# Patient Record
Sex: Male | Born: 1955 | Race: White | Hispanic: No | Marital: Married | Smoking: Former smoker
Health system: Southern US, Community
[De-identification: ages and names within clinical notes are randomized; demographics above are authoritative.]

## PROBLEM LIST (undated history)

## (undated) ENCOUNTER — Ambulatory Visit: Payer: Medicare Other | Source: Home / Self Care

## (undated) ENCOUNTER — Ambulatory Visit: Source: Home / Self Care

## (undated) DIAGNOSIS — G4700 Insomnia, unspecified: Secondary | ICD-10-CM

## (undated) DIAGNOSIS — F32A Depression, unspecified: Secondary | ICD-10-CM

## (undated) DIAGNOSIS — R0600 Dyspnea, unspecified: Secondary | ICD-10-CM

## (undated) DIAGNOSIS — G8929 Other chronic pain: Secondary | ICD-10-CM

## (undated) DIAGNOSIS — I10 Essential (primary) hypertension: Secondary | ICD-10-CM

## (undated) DIAGNOSIS — F329 Major depressive disorder, single episode, unspecified: Secondary | ICD-10-CM

## (undated) DIAGNOSIS — R7309 Other abnormal glucose: Secondary | ICD-10-CM

## (undated) DIAGNOSIS — E785 Hyperlipidemia, unspecified: Secondary | ICD-10-CM

## (undated) DIAGNOSIS — IMO0001 Reserved for inherently not codable concepts without codable children: Secondary | ICD-10-CM

## (undated) DIAGNOSIS — M25511 Pain in right shoulder: Secondary | ICD-10-CM

## (undated) DIAGNOSIS — E669 Obesity, unspecified: Secondary | ICD-10-CM

## (undated) DIAGNOSIS — M549 Dorsalgia, unspecified: Secondary | ICD-10-CM

## (undated) DIAGNOSIS — G4733 Obstructive sleep apnea (adult) (pediatric): Secondary | ICD-10-CM

## (undated) DIAGNOSIS — M1990 Unspecified osteoarthritis, unspecified site: Secondary | ICD-10-CM

## (undated) DIAGNOSIS — J45909 Unspecified asthma, uncomplicated: Secondary | ICD-10-CM

## (undated) DIAGNOSIS — M6530 Trigger finger, unspecified finger: Secondary | ICD-10-CM

## (undated) DIAGNOSIS — Z9109 Other allergy status, other than to drugs and biological substances: Secondary | ICD-10-CM

## (undated) DIAGNOSIS — K219 Gastro-esophageal reflux disease without esophagitis: Secondary | ICD-10-CM

## (undated) DIAGNOSIS — M25512 Pain in left shoulder: Secondary | ICD-10-CM

## (undated) DIAGNOSIS — Z9989 Dependence on other enabling machines and devices: Principal | ICD-10-CM

## (undated) HISTORY — DX: Essential (primary) hypertension: I10

## (undated) HISTORY — DX: Trigger finger, unspecified finger: M65.30

## (undated) HISTORY — DX: Gastro-esophageal reflux disease without esophagitis: K21.9

## (undated) HISTORY — DX: Dorsalgia, unspecified: M54.9

## (undated) HISTORY — DX: Pain in left shoulder: M25.512

## (undated) HISTORY — DX: Unspecified osteoarthritis, unspecified site: M19.90

## (undated) HISTORY — DX: Obstructive sleep apnea (adult) (pediatric): G47.33

## (undated) HISTORY — DX: Depression, unspecified: F32.A

## (undated) HISTORY — PX: OTHER SURGICAL HISTORY: SHX169

## (undated) HISTORY — DX: Reserved for inherently not codable concepts without codable children: IMO0001

## (undated) HISTORY — PX: HERNIA REPAIR: SHX51

## (undated) HISTORY — DX: Obesity, unspecified: E66.9

## (undated) HISTORY — DX: Hyperlipidemia, unspecified: E78.5

## (undated) HISTORY — DX: Other allergy status, other than to drugs and biological substances: Z91.09

## (undated) HISTORY — DX: Other abnormal glucose: R73.09

## (undated) HISTORY — DX: Major depressive disorder, single episode, unspecified: F32.9

## (undated) HISTORY — DX: Unspecified asthma, uncomplicated: J45.909

## (undated) HISTORY — DX: Other chronic pain: G89.29

## (undated) HISTORY — DX: Dyspnea, unspecified: R06.00

## (undated) HISTORY — DX: Insomnia, unspecified: G47.00

## (undated) HISTORY — DX: Dependence on other enabling machines and devices: Z99.89

## (undated) HISTORY — DX: Pain in right shoulder: M25.511

---

## 2002-08-09 ENCOUNTER — Encounter: Payer: Self-pay | Admitting: Neurology

## 2007-05-12 ENCOUNTER — Emergency Department (HOSPITAL_COMMUNITY): Admission: EM | Admit: 2007-05-12 | Discharge: 2007-05-12 | Payer: Self-pay | Admitting: Emergency Medicine

## 2008-05-16 ENCOUNTER — Encounter: Payer: Self-pay | Admitting: Neurology

## 2012-07-19 HISTORY — PX: APPENDECTOMY: SHX54

## 2012-09-18 HISTORY — PX: HEMORRHOID SURGERY: SHX153

## 2014-07-25 ENCOUNTER — Encounter: Payer: Self-pay | Admitting: Neurology

## 2014-08-01 ENCOUNTER — Ambulatory Visit (INDEPENDENT_AMBULATORY_CARE_PROVIDER_SITE_OTHER): Payer: BC Managed Care – PPO | Admitting: Neurology

## 2014-08-01 ENCOUNTER — Encounter: Payer: Self-pay | Admitting: Neurology

## 2014-08-01 ENCOUNTER — Encounter (INDEPENDENT_AMBULATORY_CARE_PROVIDER_SITE_OTHER): Payer: Self-pay

## 2014-08-01 VITALS — BP 135/78 | HR 59 | Temp 98.2°F | Resp 18 | Ht 69.5 in | Wt 242.0 lb

## 2014-08-01 DIAGNOSIS — E669 Obesity, unspecified: Secondary | ICD-10-CM

## 2014-08-01 DIAGNOSIS — Z9989 Dependence on other enabling machines and devices: Principal | ICD-10-CM

## 2014-08-01 DIAGNOSIS — G4733 Obstructive sleep apnea (adult) (pediatric): Secondary | ICD-10-CM

## 2014-08-01 HISTORY — DX: Obstructive sleep apnea (adult) (pediatric): G47.33

## 2014-08-01 NOTE — Patient Instructions (Signed)
Please continue using your biPAP regularly. While your insurance requires that you use PAP at least 4 hours each night on 70% of the nights, I recommend, that you not skip any nights and use it throughout the night if you can. Getting used to BiPAP and staying with the treatment long term does take time and patience and discipline. Untreated obstructive sleep apnea when it is moderate to severe can have an adverse impact on cardiovascular health and raise her risk for heart disease, arrhythmias, hypertension, congestive heart failure, stroke and diabetes. Untreated obstructive sleep apnea causes sleep disruption, nonrestorative sleep, and sleep deprivation. This can have an impact on your day to day functioning and cause daytime sleepiness and impairment of cognitive function, memory loss, mood disturbance, and problems focussing. Using PAP regularly can improve these symptoms.  Since you are doing well currently, I can see you back in a year.

## 2014-08-01 NOTE — Progress Notes (Signed)
Subjective:    Patient ID: George Montgomery is a 58 y.o. male.  HPI    Star Age, MD, PhD [facility] [address] P.O. [place],  [address]  Dear Dr. Bebe Shaggy,   I saw your patient, George Montgomery, upon your kind request in my neurologic clinic today for initial consultation of his obstructive sleep apnea, for re-evaluation. The patient is unaccompanied today. As you know, George Montgomery is a 58 year old right-handed gentleman with an underlying medical  history of cervical and lumbar degenerative spine disease, anxiety, depression (not currently on medication d/t side), asthma, hypertension, obesity, who was diagnosed with obstructive sleep apnea a few years ago. He has not had a recheck of his CPAP in over 3 years. Unfortunately, I do not have any prior sleep study results available for review. The patient recalls being diagnosed with OSA some 12 years ago, severe, per patient. Over the years, he has had about 3 sleep studies, last some 3 years ago. He was originally placed on CPAP and most recently at the last sleep study he remembers that he was switched to BiPAP. He brought his BiPAP machine in for review and we were able to get a download: This was from 05/01/2014 through 07/31/2014 during which time he used his BiPAP machine every night without any skipped days and his percent used days greater than 4 hours was 100% indicating superb compliance, average usage was 8 hours and 7 minutes approximately. Residual AHI was low at 0.7 per hour and leak was very low at 1 minute and 58 seconds as far as large leak per day dose. His pressure is 17/13 cm.   His typical bedtime is between 10:30 and 11 PM and usually falls asleep quickly. Weight time is between 6 and 6:30 AM. He estimates that he gets about 7 hours of sleep. He denies any morning headaches or severe daytime somnolence. He does have an ESS of 11/24 today. He denies significant restless leg  symptoms. His first sleep study may have been about 12 years ago. He gained quite a bit of weight after he quit smoking about 2 years ago. He gets his DME care through [facility] [employer].   His Past Medical History Is Significant For: Past Medical History  Diagnosis Date  . Reflux   . Environmental allergies     His Past Surgical History Is Significant For: Past Surgical History  Procedure Laterality Date  . Hemorrhoid surgery  09/2012  . Appendectomy  07/2012    His Family History Is Significant For: Family History  Problem Relation Age of Onset  . Heart disease Mother   . Cancer - Colon Other   . Cancer - Prostate Other     His Social History Is Significant For: History   Social History  . Marital Status: Married    Spouse Name: N/A    Number of Children: N/A  . Years of Education: N/A   Social History Main Topics  . Smoking status: Former Smoker    Quit date: 12/03/2011  . Smokeless tobacco: Not on file  . Alcohol Use: 1.0 oz/week    2 drink(s) per week     Comment: occ  . Drug Use: No  . Sexual Activity: Not on file   Other Topics Concern  . Not on file   Social History Narrative   Right handed, caffeine 3-4 cups daily, Married, 4 kids, 9 g kids., FT insurance agent (self employed). 4 yrs college  His Allergies Are:  Allergies not on file:   His Current Medications Are:  Outpatient Encounter Prescriptions as of 08/01/2014  Medication Sig  . clotrimazole-betamethasone (LOTRISONE) cream   . fluticasone (FLONASE) 50 MCG/ACT nasal spray   . HYDROcodone-acetaminophen (NORCO/VICODIN) 5-325 MG per tablet Take 1 tablet by mouth every 6 (six) hours as needed for moderate pain.  . montelukast (SINGULAIR) 10 MG tablet   . pantoprazole (PROTONIX) 40 MG tablet   . zolpidem (AMBIEN) 10 MG tablet Take 10 mg by mouth at bedtime as needed for sleep (takes 1/2 tablet).  : Review of Systems:  Out of a complete 14 point review of systems, all are reviewed and  negative with the exception of these symptoms as listed below:   Review of Systems  Constitutional: Positive for fatigue and unexpected weight change.  HENT:       Trouble swallowing  Respiratory: Positive for cough.   Endocrine: Positive for heat intolerance.  Genitourinary:       Impotence  Skin:       itiching , moles  Neurological:       Memory loss, insomnia, RL    Objective:  Neurologic Exam  Physical Exam Physical Examination:   Filed Vitals:   08/01/14 1003  BP: 135/78  Pulse: 59  Temp: 98.2 F (36.8 C)  Resp: 18   General Examination: The patient is a very pleasant 58 y.o. male in no acute distress. He appears well-developed and well-nourished and well groomed. He is obese.   HEENT: Normocephalic, atraumatic, pupils are equal, round and reactive to light and accommodation. Funduscopic exam is normal with sharp disc margins noted. Extraocular tracking is good without limitation to gaze excursion or nystagmus noted. Normal smooth pursuit is noted. Hearing is grossly intact. Tympanic membranes are clear bilaterally. Face is symmetric with normal facial animation and normal facial sensation. Speech is clear with no dysarthria noted. There is no hypophonia. There is no lip, neck/head, jaw or voice tremor. Neck is supple with full range of passive and active motion. There are no carotid bruits on auscultation. Oropharynx exam reveals: moderate mouth dryness, adequate dental hygiene and moderate airway crowding, due to thicker soft palate, and larger tongue and redundant soft palate. Mallampati is class II. Tongue protrudes centrally and palate elevates symmetrically. Tonsils are absent. Neck size is 17.25 inches. He has a Absent overbite. Nasal inspection reveals no significant nasal mucosal bogginess or redness and no septal deviation.   Chest: Clear to auscultation without wheezing, rhonchi or crackles noted.  Heart: S1+S2+0, regular and normal without murmurs, rubs or  gallops noted.   Abdomen: Soft, non-tender and non-distended with normal bowel sounds appreciated on auscultation.  Extremities: There is no pitting edema in the distal lower extremities bilaterally. Pedal pulses are intact.  Skin: Warm and dry without trophic changes noted. There are no varicose veins.  Musculoskeletal: exam reveals no obvious joint deformities, tenderness or joint swelling or erythema.   Neurologically:  Mental status: The patient is awake, alert and oriented in all 4 spheres. His immediate and remote memory, attention, language skills and fund of knowledge are appropriate. There is no evidence of aphasia, agnosia, apraxia or anomia. Speech is clear with normal prosody and enunciation. Thought process is linear. Mood is normal and affect is normal.  Cranial nerves II - XII are as described above under HEENT exam. In addition: shoulder shrug is normal with equal shoulder height noted. Motor exam: Normal bulk, strength and tone is noted. There is  no drift, tremor or rebound. Romberg is negative. Reflexes are 2+ throughout. Babinski: Toes are flexor bilaterally. Fine motor skills and coordination: intact with normal finger taps, normal hand movements, normal rapid alternating patting, normal foot taps and normal foot agility.  Cerebellar testing: No dysmetria or intention tremor on finger to nose testing. Heel to shin is unremarkable bilaterally. There is no truncal or gait ataxia.  Sensory exam: intact to light touch, pinprick, vibration, temperature sense in the upper and lower extremities.  Gait, station and balance: He stands easily. No veering to one side is noted. No leaning to one side is noted. Posture is age-appropriate and stance is narrow based. Gait shows normal stride length and normal pace. No problems turning are noted. He turns en bloc. Tandem walk is unremarkable. Intact toe and heel stance is noted.               Assessment and Plan:   In summary, KIMBERLY COYE  is a very pleasant 59 y.o.-year old male with an underlying medical  history of cervical and lumbar degenerative spine disease, anxiety, depression, asthma, hypertension, obesity, who was diagnosed with obstructive sleep apnea several years ago, about 12 years ago per patient and he reports that he was diagnosed with severe sleep apnea at the time. While he does report interim weight gain and having had at least one or maybe 2 more sleep study since then he appears to be well treated with BiPAP therapy at a pressure of 17/13 cm with excellent compliance noted. I congratulated him regarding his superb compliance and asked him to continue therapy at this time. Leak was also not very high and at this juncture I believe he is adequately treated with no significant residual sleep related complaints per se and no significant sleep disordered breathing with a residual AHI less than 1 per hour. He is advised regarding a healthy lifestyle in general. I do believe he would benefit from losing weight and he is trying. He has enrolled with [employer] he tells me. Furthermore, he would like to meet with a nutritionist. He is encouraged to discuss this with you at your next visit. I had a long chat with the patient about my findings and the diagnosis of OSA, its prognosis and treatment options. We talked about medical treatments, surgical interventions and non-pharmacological approaches. I explained in particular the risks and ramifications of untreated moderate to severe OSA, especially with respect to developing cardiovascular disease down the Road, including congestive heart failure, difficult to treat hypertension, cardiac arrhythmias, or stroke. Even type 2 diabetes has, in part, been linked to untreated OSA. Symptoms of untreated OSA include daytime sleepiness, memory problems, mood irritability and mood disorder such as depression and anxiety, lack of energy, as well as recurrent headaches, especially morning  headaches. We talked about trying to maintain a healthy lifestyle in general, as well as the importance of weight control. I encouraged the patient to eat healthy, exercise daily and keep well hydrated, to keep a scheduled bedtime and wake time routine, to not skip any meals and eat healthy snacks in between meals. I advised the patient not to drive when feeling sleepy. I recommended the following at this time: Continue BiPAP therapy with the current settings. He is doing well and I would like for him to followup in a years time sooner if we need to. I answered all his questions today and the patient was in agreement. Thank you very much for allowing me to participate  in the care of this nice patient. If I can be of any further assistance to you please do not hesitate to call me at [phone].  Sincerely,   Star Age, MD, PhD

## 2014-08-27 ENCOUNTER — Encounter: Payer: Self-pay | Admitting: Neurology

## 2014-08-27 ENCOUNTER — Ambulatory Visit (INDEPENDENT_AMBULATORY_CARE_PROVIDER_SITE_OTHER): Payer: BC Managed Care – PPO | Admitting: Neurology

## 2014-08-27 VITALS — BP 146/75 | HR 70 | Temp 98.1°F | Ht 70.0 in | Wt 242.0 lb

## 2014-08-27 DIAGNOSIS — Z9989 Dependence on other enabling machines and devices: Secondary | ICD-10-CM

## 2014-08-27 DIAGNOSIS — G44009 Cluster headache syndrome, unspecified, not intractable: Secondary | ICD-10-CM

## 2014-08-27 DIAGNOSIS — R519 Headache, unspecified: Secondary | ICD-10-CM

## 2014-08-27 DIAGNOSIS — R51 Headache: Secondary | ICD-10-CM

## 2014-08-27 DIAGNOSIS — E669 Obesity, unspecified: Secondary | ICD-10-CM

## 2014-08-27 DIAGNOSIS — G4733 Obstructive sleep apnea (adult) (pediatric): Secondary | ICD-10-CM

## 2014-08-27 MED ORDER — VERAPAMIL HCL 40 MG PO TABS: 40.0000 mg | ORAL_TABLET | Freq: Three times a day (TID) | ORAL | Status: DC

## 2014-08-27 MED ORDER — SUMATRIPTAN SUCCINATE 100 MG PO TABS: 100.0000 mg | ORAL_TABLET | Freq: Once | ORAL | Status: DC

## 2014-08-27 NOTE — Progress Notes (Signed)
Subjective:    Patient ID: George Montgomery is a 58 y.o. male.  HPI     Interim history:   George Montgomery is a 58 year old right-handed gentleman with an underlying medical  history of cervical and lumbar degenerative spine disease, anxiety, depression (not currently on medication d/t side), asthma, hypertension, obesity, and OSA, who presents for sooner than scheduled appointment for a new problem and a new referral by Dr. Bebe Shaggy for evaluation of cluster headaches. The patient is unaccompanied today. I first met him on 08/01/2014 at the request of Dr. Bebe Shaggy for reevaluation of his obstructive sleep apnea, treated with BiPAP. He was doing well at the time and compliant with BiPAP therapy which was effective.   The patient reports new onset HA since 08/02/14, started abruptly. No triggers noted. He recently had the flu shot. He also stopped his testosterone replacement about a month ago. He does endorse more stress, work related. He has also recently increased his caffeine intake. He has had a daily headache which lasts up to 2 hours. It typically starts in the evening. It is right-sided behind his eye and retro-orbital also in the temporal area and radiates to the back. It is severe. He does get puffiness around the right eye and occasional runny nose on the right side. He does not have any double vision or blurry vision. Glasses have not been checked in over 18 months. He does not have nausea but does have to lie down. Hydrocodone has not helped it only 1 pill and he has to take 2. He has also taken high-dose Motrin, 600 mg, and has tried a muscle relaxant, he has to lie down. He has never had anything like this before. He does not have a history of migraines before. He does not note any neurological symptoms and in between he feels at baseline. He has been reluctant to do anything besides the minimal for fear of exacerbating these headaches. They have been daily since 08/02/2014. He was prescribed  Frovatriptan on 08/21/14 by Dr. Bebe Shaggy, but his insurance did not cover that. He has tried sumatriptan and 50 mg which he felt has helped after about 15-20 minutes.he has found relief with taking a warm bath. He has also tried heat pad on his head, which provided some relief.   The patient recalls being diagnosed with OSA some 12 years ago, severe, per patient. Over the years, he has had about 3 sleep studies, last some 3 years ago. He was originally placed on CPAP and most recently at the last sleep study he remembers that he was switched to BiPAP. He brought his BiPAP machine in for review and we were able to get a download: This was from 05/01/2014 through 07/31/2014 during which time he used his BiPAP machine every night without any skipped days and his percent used days greater than 4 hours was 100% indicating superb compliance, average usage was 8 hours and 7 minutes approximately. Residual AHI was low at 0.7 per hour and leak was very low at 1 minute and 58 seconds as far as large leak per day dose. His pressure is 17/13 cm.    His typical bedtime is between 10:30 and 11 PM and usually falls asleep quickly. Weight time is between 6 and 6:30 AM. He estimates that he gets about 7 hours of sleep. He denies any morning headaches or severe daytime somnolence. He does have an ESS of 11/24 today. He denies significant restless leg symptoms. His first sleep study may  have been about 12 years ago. He gained quite a bit of weight after he quit smoking about 2 years ago. He gets his DME care through [facility] [employer].     His Past Medical History Is Significant For: Past Medical History  Diagnosis Date  . Reflux   . Environmental allergies   . OSA on CPAP 08/01/2014    His Past Surgical History Is Significant For: Past Surgical History  Procedure Laterality Date  . Hemorrhoid surgery  09/2012  . Appendectomy  07/2012    Her Family History Is Significant For: Family History  Problem  Relation Age of Onset  . Heart disease Mother   . Cancer - Colon Other   . Cancer - Prostate Other   . Stroke Maternal Aunt   . Cancer - Colon Paternal Uncle   . Cancer - Prostate Paternal Uncle   . Macular degeneration Mother     His Social History Is Significant For: History   Social History  . Marital Status: Married    Spouse Name: [name]    Number of Children: 4  . Years of Education: college   Occupational History  .      [employer]   Social History Main Topics  . Smoking status: Former Smoker    Quit date: 12/03/2011  . Smokeless tobacco: Never Used  . Alcohol Use: 1.2 oz/week    2 Not specified per week     Comment: occ  . Drug Use: No  . Sexual Activity: None   Other Topics Concern  . 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 azithromycin (ZITHROMAX) 500 MG tablet 500 mg daily.  . clotrimazole-betamethasone (LOTRISONE) cream   . fluticasone (FLONASE) 50 MCG/ACT nasal spray   . HYDROcodone-acetaminophen (NORCO/VICODIN) 5-325 MG per tablet Take 1 tablet by mouth every 6 (six) hours as needed for moderate pain.  Marland Kitchen ibuprofen (ADVIL,MOTRIN) 600 MG tablet Take 600 mg by mouth every 6 (six) hours as needed. 2-3  . ketorolac (TORADOL) 10 MG tablet   . montelukast (SINGULAIR) 10 MG tablet   . orphenadrine (NORFLEX) 100 MG tablet 100 mg 2 (two) times daily.  . SUMAtriptan (IMITREX) 50 MG tablet   . zolpidem (AMBIEN) 10 MG tablet Take 10 mg by mouth at bedtime as needed for sleep (takes 1/2 tablet).  . pantoprazole (PROTONIX) 40 MG tablet   :  Review of Systems:  Out of a complete 14 point review of systems, all are reviewed and negative with the exception of these symptoms as listed below:    Review of Systems  Constitutional:       Weight gain  HENT:       Ringing in ears  Eyes: Positive for pain.  Respiratory: Positive for wheezing.   Genitourinary:       Impotence   Neurological: Positive for headaches.       Restless legs  Psychiatric/Behavioral:       Anxiety    Objective:  Neurologic Exam  Physical Exam Physical Examination:   Filed Vitals:   08/27/14 0825  BP: 146/75  Pulse: 70  Temp: 98.1 F (36.7 C)     General Examination: The patient is a very pleasant 58  y.o. male in no acute distress. He appears well-developed and well-nourished and well groomed. He is obese.   HEENT: Normocephalic, atraumatic, pupils are equal, round and reactive to light and accommodation. Funduscopic exam is normal with sharp disc margins noted. Extraocular tracking is good without limitation to gaze excursion or nystagmus noted. Normal smooth pursuit is noted. Hearing is grossly intact. Tympanic membranes are clear bilaterally. Face is symmetric with normal facial animation and normal facial sensation. Speech is clear with no dysarthria noted. There is no hypophonia. There is no lip, neck/head, jaw or voice tremor. Neck is supple with full range of passive and active motion. There are no carotid bruits on auscultation. Oropharynx exam reveals: moderate mouth dryness, adequate dental hygiene and moderate airway crowding, due to thicker soft palate, and larger tongue and redundant soft palate. Mallampati is class II. Tongue protrudes centrally and palate elevates symmetrically. Tonsils are absent.   Chest: Clear to auscultation without wheezing, rhonchi or crackles noted.  Heart: S1+S2+0, regular and normal without murmurs, rubs or gallops noted.   Abdomen: Soft, non-tender and non-distended with normal bowel sounds appreciated on auscultation.  Extremities: There is no pitting edema in the distal lower extremities bilaterally. Pedal pulses are intact.  Skin: Warm and dry without  trophic changes noted. There are no varicose veins.he has mild swelling of his left earlobe, it looks like he may have had a little bug bite or a rash on the left earlobe which is probably unrelated to his headaches.  Musculoskeletal: exam reveals no obvious joint deformities, tenderness or joint swelling or erythema. 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   Neurologically:  Mental status: The patient is awake, alert and oriented in all 4 spheres. His immediate and remote memory, attention, language skills and fund of knowledge are appropriate. There is no evidence of aphasia, agnosia, apraxia or anomia. Speech is clear with normal prosody and enunciation. Thought process is linear. Mood is normal and affect is normal.  Cranial nerves II - XII are as described above under HEENT exam. In addition: shoulder shrug is normal with equal shoulder height noted. Motor exam: Normal bulk, strength and tone is noted. There is no drift, tremor or rebound. Romberg is negative. Reflexes are 2+ throughout. Babinski: Toes are flexor bilaterally. Fine motor skills and coordination: intact with normal finger taps, normal hand movements, normal rapid alternating patting, normal foot taps and normal foot agility.  Cerebellar testing: No dysmetria or intention tremor on finger to nose testing. Heel to shin is unremarkable bilaterally. There is no truncal or gait ataxia.  Sensory exam: intact to light touch, pinprick, vibration, temperature sense in the upper and lower extremities.  Gait, station and balance: He stands easily. No veering to one side is noted. No leaning to one side is noted. Posture is age-appropriate and stance is narrow based. Gait shows normal stride length and normal pace. No problems turning are noted. He turns en bloc. Tandem walk is unremarkable. Intact toe and heel stance is noted.               Assessment and Plan:   In summary, George Montgomery is a very pleasant 58 year old male with an underlying medical  history of cervical and lumbar degenerative spine disease, anxiety, depression, asthma, hypertension, obesity, and obstructive sleep apnea, compliant with BiPAP therapy who presents with a new problem of cluster headaches. His history is suggestive of cluster headaches.  I talked to the patient and his wife at length about this diagnosis, its prognosis and treatment options. We talked about headache triggers in general. We talked about abortive and preventive treatments. I suggested that he try to reduce his caffeine intake gradually and stay well-hydrated. I also asked him to avoid narcotics. For abortive treatment I would like for him to try sumatriptan and then 100 mg strength. For preventative treatment I suggested we try verapamil. I would like to go ahead and do a brain MRI with and without contrast. He does have a nonfocal neurological and general exam. Nevertheless he has new onset headaches after age 58. Therefore I suggest a brain scan. He was in agreement. I provided him with a prescription for sumatriptan 100 mg strength and verapamil prescription with titration. We will call him after his brain MRI is done. I will see him back in a month or so. He is advised to call or email in the interim with any questions or concerns. I answered all her questions today and the patient and his wife were in agreement.

## 2014-08-27 NOTE — Patient Instructions (Addendum)
Please remember, common headache triggers are: sleep deprivation, dehydration, overheating, stress, hypoglycemia or skipping meals and blood sugar fluctuations, excessive pain medications or excessive alcohol use or caffeine withdrawal. Some people have food triggers such as aged cheese, orange juice or chocolate, especially dark chocolate, or MSG (monosodium glutamate). Try to avoid these headache triggers as much possible. It may be helpful to keep a headache diary to figure out what makes your headaches worse or brings them on and what alleviates them. Some people report headache onset after exercise but studies have shown that regular exercise may actually prevent headaches from coming. If you have exercise-induced headaches, please make sure that you drink plenty of fluid before and after exercising and that you do not over do it and do not overheat.  Try to avoid narcotic pain medication. Avoid alcohol during a cluster. We will do a brain MRI with and without contrast. Get your yearly eye exam done.   You may have a headache syndrome called "cluster headaches". These headaches typically are described as very severe and may appear in clusters, that is with day to day appearance or several times a day lasting for days or weeks at a time. Sometimes there can be long gaps of weeks to months or years even in between clusters. The typical headache is one-sided and very intense, often described as sharp and stabbing or throbbing and is often accompanied by at least one of the following: One-sided conjunctival injection (redness of eyr) and/or lacrimation (excess tearing); one sided nasal congestion and/or rhinorrhea (nasal discharge); one sided eyelid edema (swelling); one sided forehead and facial sweating; one sided miosis (smaller pupil size) and/or ptosis (droopy eye lid); a sense of restlessness or agitation, need for pacing. Acute treatment includes high flow oxygen at 6-7 L per minute via facial mask for  up to 20 minutes. Most people, about 70% of patients with this condition find relief within 15 minutes of oxygen treatment. Beyond 20 minutes there is no additional benefit of trying oxygen. Treatment is divided into abortive and prophylactic, that his preventative treatment. Abortive treatment includes medications in the triptan family such as Imitrex or Zomig or Frova. One of the most successful preventative medications are calcium channel blockers in the form of verapamil, which we will try.   Verapamil 40 mg: take 1 pill in AM for one week, then twice daily for one week, then 3 times a day thereafter. Side effects may include: constipation, lightheadedness, fainting, heartburn, wheezing, slow heartbeat, blurry vision, blue lips and fingernails, pins and needles sensations.   If you have a rash or difficulty breathing or chest pain, you have to stop the medication immediately. Call 911 if you think you are having a serious reaction.

## 2014-09-26 ENCOUNTER — Telehealth: Payer: Self-pay | Admitting: Neurology

## 2014-09-26 ENCOUNTER — Ambulatory Visit: Payer: BC Managed Care – PPO | Admitting: Neurology

## 2014-09-26 NOTE — Telephone Encounter (Signed)
Called patient concerning his download for appointment and he has cancelled for today, but his DME company that he uses is [employer]. He would like some reccomendations on another company.

## 2014-11-06 ENCOUNTER — Encounter (INDEPENDENT_AMBULATORY_CARE_PROVIDER_SITE_OTHER): Payer: Self-pay

## 2014-11-06 ENCOUNTER — Ambulatory Visit: Payer: BLUE CROSS/BLUE SHIELD | Admitting: Internal Medicine

## 2014-11-06 ENCOUNTER — Encounter: Payer: Self-pay | Admitting: Internal Medicine

## 2014-11-06 VITALS — BP 142/76 | HR 92 | Temp 98.6°F | Resp 16 | Ht 69.5 in | Wt 246.2 lb

## 2014-11-06 DIAGNOSIS — E559 Vitamin D deficiency, unspecified: Secondary | ICD-10-CM | POA: Insufficient documentation

## 2014-11-06 DIAGNOSIS — G44029 Chronic cluster headache, not intractable: Secondary | ICD-10-CM

## 2014-11-06 DIAGNOSIS — J209 Acute bronchitis, unspecified: Secondary | ICD-10-CM

## 2014-11-06 DIAGNOSIS — G44009 Cluster headache syndrome, unspecified, not intractable: Secondary | ICD-10-CM | POA: Insufficient documentation

## 2014-11-06 DIAGNOSIS — E785 Hyperlipidemia, unspecified: Secondary | ICD-10-CM

## 2014-11-06 DIAGNOSIS — E349 Endocrine disorder, unspecified: Secondary | ICD-10-CM | POA: Insufficient documentation

## 2014-11-06 DIAGNOSIS — R5383 Other fatigue: Secondary | ICD-10-CM

## 2014-11-06 DIAGNOSIS — E1159 Type 2 diabetes mellitus with other circulatory complications: Secondary | ICD-10-CM | POA: Insufficient documentation

## 2014-11-06 DIAGNOSIS — R7303 Prediabetes: Secondary | ICD-10-CM

## 2014-11-06 DIAGNOSIS — J0110 Acute frontal sinusitis, unspecified: Secondary | ICD-10-CM

## 2014-11-06 DIAGNOSIS — E66811 Obesity, class 1: Secondary | ICD-10-CM | POA: Insufficient documentation

## 2014-11-06 DIAGNOSIS — I10 Essential (primary) hypertension: Secondary | ICD-10-CM

## 2014-11-06 DIAGNOSIS — Z79899 Other long term (current) drug therapy: Secondary | ICD-10-CM

## 2014-11-06 DIAGNOSIS — G4733 Obstructive sleep apnea (adult) (pediatric): Secondary | ICD-10-CM

## 2014-11-06 LAB — CBC WITH DIFFERENTIAL/PLATELET
Basophils Absolute: 0.1 10*3/uL (ref 0.0–0.1)
Basophils Relative: 1 % (ref 0–1)
Eosinophils Absolute: 0.2 10*3/uL (ref 0.0–0.7)
Eosinophils Relative: 3 % (ref 0–5)
HEMATOCRIT: 42.7 % (ref 39.0–52.0)
Hemoglobin: 14.8 g/dL (ref 13.0–17.0)
Lymphocytes Relative: 33 % (ref 12–46)
Lymphs Abs: 1.8 10*3/uL (ref 0.7–4.0)
MCH: 31.8 pg (ref 26.0–34.0)
MCHC: 34.7 g/dL (ref 30.0–36.0)
MCV: 91.6 fL (ref 78.0–100.0)
MONOS PCT: 12 % (ref 3–12)
MPV: 9.5 fL (ref 8.6–12.4)
Monocytes Absolute: 0.7 10*3/uL (ref 0.1–1.0)
NEUTROS ABS: 2.8 10*3/uL (ref 1.7–7.7)
NEUTROS PCT: 51 % (ref 43–77)
Platelets: 170 10*3/uL (ref 150–400)
RBC: 4.66 MIL/uL (ref 4.22–5.81)
RDW: 13.5 % (ref 11.5–15.5)
WBC: 5.5 10*3/uL (ref 4.0–10.5)

## 2014-11-06 MED ORDER — HYDROCODONE-ACETAMINOPHEN 5-325 MG PO TABS: ORAL_TABLET | ORAL | Status: DC

## 2014-11-06 MED ORDER — AZITHROMYCIN 250 MG PO TABS: ORAL_TABLET | ORAL | Status: AC

## 2014-11-06 MED ORDER — VERAPAMIL HCL ER 120 MG PO TBCR: EXTENDED_RELEASE_TABLET | ORAL | Status: DC

## 2014-11-06 MED ORDER — PREDNISONE 20 MG PO TABS: ORAL_TABLET | ORAL | Status: DC

## 2014-11-06 NOTE — Patient Instructions (Signed)
Recommend the book "The END of DIETING" by Dr Excell Seltzer   & the book "The END of DIABETES " by Dr Excell Seltzer  At [email or web address] - get book & Audio CD's      Being diabetic has a  300% increased risk for heart attack, stroke, cancer, and alzheimer- type vascular dementia. It is very important that you work harder with diet by avoiding all foods that are white except chicken & fish. Avoid white rice (brown & wild rice is OK), white potatoes (sweetpotatoes in moderation is OK), White bread or wheat bread or anything made out of white flour like bagels, donuts, rolls, buns, biscuits, cakes, pastries, cookies, pizza crust, and pasta (made from white flour & egg whites) - vegetarian pasta or spinach or wheat pasta is OK. Multigrain breads like Arnold's or [employer], or multigrain sandwich thins or flatbreads.  Diet, exercise and weight loss can reverse and cure diabetes in the early stages.  Diet, exercise and weight loss is very important in the control and prevention of complications of diabetes which affects every system in your body, ie. Brain - dementia/stroke, eyes - glaucoma/blindness, heart - heart attack/heart failure, kidneys - dialysis, stomach - gastric paralysis, intestines - malabsorption, nerves - severe painful neuritis, circulation - gangrene & loss of a leg(s), and finally cancer and Alzheimers.    I recommend avoid fried & greasy foods,  sweets/candy, white rice (brown or wild rice or Quinoa is OK), white potatoes (sweet potatoes are OK) - anything made from white flour - bagels, doughnuts, rolls, buns, biscuits,white and wheat breads, pizza crust and traditional pasta made of white flour & egg white(vegetarian pasta or spinach or wheat pasta is OK).  Multi-grain bread is OK - like multi-grain flat bread or sandwich thins. Avoid alcohol in excess. Exercise is also important.    Eat all the vegetables you want - avoid meat, especially red meat and dairy - especially cheese.  Cheese  is the most concentrated form of trans-fats which is the worst thing to clog up our arteries. Veggie cheese is OK which can be found in the fresh produce section at [employer] or [employer] or [employer]  Hypertension Hypertension, commonly called high blood pressure, is when the force of blood pumping through your arteries is too strong. Your arteries are the blood vessels that carry blood from your heart throughout your body. A blood pressure reading consists of a higher number over a lower number, such as 110/72. The higher number (systolic) is the pressure inside your arteries when your heart pumps. The lower number (diastolic) is the pressure inside your arteries when your heart relaxes. Ideally you want your blood pressure below 120/80. Hypertension forces your heart to work harder to pump blood. Your arteries may become narrow or stiff. Having hypertension puts you at risk for heart disease, stroke, and other problems.  RISK FACTORS Some risk factors for high blood pressure are controllable. Others are not.  Risk factors you cannot control include:   Race. You may be at higher risk if you are African American.  Age. Risk increases with age.  Gender. Men are at higher risk than women before age 60 years. After age 25, women are at higher risk than men. Risk factors you can control include:  Not getting enough exercise or physical activity.  Being overweight.  Getting too much fat, sugar, calories, or salt in your diet.  Drinking too much alcohol. SIGNS AND SYMPTOMS Hypertension does not usually cause signs  or symptoms. Extremely high blood pressure (hypertensive crisis) may cause headache, anxiety, shortness of breath, and nosebleed. DIAGNOSIS  To check if you have hypertension, your health care provider will measure your blood pressure while you are seated, with your arm held at the level of your heart. It should be measured at least twice using the same arm. Certain conditions  can cause a difference in blood pressure between your right and left arms. A blood pressure reading that is higher than normal on one occasion does not mean that you need treatment. If one blood pressure reading is high, ask your health care provider about having it checked again. TREATMENT  Treating high blood pressure includes making lifestyle changes and possibly taking medicine. Living a healthy lifestyle can help lower high blood pressure. You may need to change some of your habits. Lifestyle changes may include:  Following the DASH diet. This diet is high in fruits, vegetables, and whole grains. It is low in salt, red meat, and added sugars.  Getting at least 2 hours of brisk physical activity every week.  Losing weight if necessary.  Not smoking.  Limiting alcoholic beverages.  Learning ways to reduce stress. If lifestyle changes are not enough to get your blood pressure under control, your health care provider may prescribe medicine. You may need to take more than one. Work closely with your health care provider to understand the risks and benefits. HOME CARE INSTRUCTIONS  Have your blood pressure rechecked as directed by your health care provider.   Take medicines only as directed by your health care provider. Follow the directions carefully. Blood pressure medicines must be taken as prescribed. The medicine does not work as well when you skip doses. Skipping doses also puts you at risk for problems.   Do not smoke.   Monitor your blood pressure at home as directed by your health care provider. SEEK MEDICAL CARE IF:   You think you are having a reaction to medicines taken.  You have recurrent headaches or feel dizzy.  You have swelling in your ankles.  You have trouble with your vision. SEEK IMMEDIATE MEDICAL CARE IF:  You develop a severe headache or confusion.  You have unusual weakness, numbness, or feel faint.  You have severe chest or abdominal  pain.  You vomit repeatedly.  You have trouble breathing. MAKE SURE YOU:   Understand these instructions.  Will watch your condition.  Will get help right away if you are not doing well or get worse. Document Released: 10/05/2005 Document Revised: 02/19/2014 Document Reviewed: 07/28/2013 [facility] Patient Information 2015 [place], [place]. This information is not intended to replace advice given to you by your health care provider. Make sure you discuss any questions you have with your health care provider. Vitamin D Deficiency Vitamin D is an important vitamin that your body needs. Having too little of it in your body is called a deficiency. A very bad deficiency can make your bones soft and can cause a condition called rickets.  Vitamin D is important to your body for different reasons, such as:   It helps your body absorb 2 minerals called calcium and phosphorus.  It helps make your bones healthy.  It may prevent some diseases, such as diabetes and multiple sclerosis.  It helps your muscles and heart. You can get vitamin D in several ways. It is a natural part of some foods. The vitamin is also added to some dairy products and cereals. Some people take vitamin D supplements.  Also, your body makes vitamin D when you are in the sun. It changes the sun's rays into a form of the vitamin that your body can use. CAUSES   Not eating enough foods that contain vitamin D.  Not getting enough sunlight.  Having certain digestive system diseases that make it hard to absorb vitamin D. These diseases include Crohn's disease, chronic pancreatitis, and cystic fibrosis.  Having a surgery in which part of the stomach or small intestine is removed.  Being obese. Fat cells pull vitamin D out of your blood. That means that obese people may not have enough vitamin D left in their blood and in other body tissues.  Having chronic kidney or liver disease. RISK FACTORS Risk factors are things that make  you more likely to develop a vitamin D deficiency. They include:  Being older.  Not being able to get outside very much.  Living in a nursing home.  Having had broken bones.  Having weak or thin bones (osteoporosis).  Having a disease or condition that changes how your body absorbs vitamin D.  Having dark skin.  Some medicines such as seizure medicines or steroids.  Being overweight or obese. SYMPTOMS Mild cases of vitamin D deficiency may not have any symptoms. If you have a very bad case, symptoms may include:  Bone pain.  Muscle pain.  Falling often.  Broken bones caused by a minor injury, due to osteoporosis. DIAGNOSIS A blood test is the best way to tell if you have a vitamin D deficiency. TREATMENT Vitamin D deficiency can be treated in different ways. Treatment for vitamin D deficiency depends on what is causing it. Options include:  Taking vitamin D supplements.  Taking a calcium supplement. Your caregiver will suggest what dose is best for you. HOME CARE INSTRUCTIONS  Take any supplements that your caregiver prescribes. Follow the directions carefully. Take only the suggested amount.  Have your blood tested 2 months after you start taking supplements.  Eat foods that contain vitamin D. Healthy choices include:  Fortified dairy products, cereals, or juices. Fortified means vitamin D has been added to the food. Check the label on the package to be sure.  Fatty fish like salmon or trout.  Eggs.  Oysters.  Do not use a tanning bed.  Keep your weight at a healthy level. Lose weight if you need to.  Keep all follow-up appointments. Your caregiver will need to perform blood tests to make sure your vitamin D deficiency is going away. SEEK MEDICAL CARE IF:  You have any questions about your treatment.  You continue to have symptoms of vitamin D deficiency.  You have nausea or vomiting.  You are constipated.  You feel confused.  You have severe  abdominal or back pain. MAKE SURE YOU:  Understand these instructions.  Will watch your condition.  Will get help right away if you are not doing well or get worse. Document Released: 12/28/2011 Document Revised: 01/30/2013 Document Reviewed: 12/28/2011 [facility] Patient Information 2015 [place], [place]. This information is not intended to replace advice given to you by your health care provider. Make sure you discuss any questions you have with your health care provider.

## 2014-11-06 NOTE — Progress Notes (Signed)
Patient ID: George Montgomery, male   DOB: Feb 26, 1956, 59 y.o.   MRN: [id number]   This very nice 59 y.o. MWM  presents as a new patient to establish for  follow up with Hypertension, Cluster HA's, Morbid Obesity, OSA on BiPAP (2002) & Testosterone Deficiency.   Patient relates he was seen by Dr Baldo Ash in Oct & Nov 2015 for OSA & cluster HA evaluation and was dx'd at that time with HTN and begun on Verapamil presumably also for prophylaxis of vascular HA's. Today's BP: (!) 142/76 mmHg. Patient has had no complaints of any cardiac type chest pain, palpitations, dyspnea/orthopnea/PND, dizziness, claudication, or dependent edema.   Patient does have Morbid Obesity and altho very suspect for HLD and preDM, he denies any knowledge of elevated cholesterol or blood sugars. The patient denies any symptoms of reactive hypoglycemia, diabetic polys, paresthesias or visual blurring. GHe does relate smoking cesation in Feb 2013 and gaining 40#. He does allege exercising regularly. He has been on Testosterone Replacement therapy for about a year with improved stamina & sense of well being.   Medication List   azithromycin 250 MG tablet  Commonly known as:  ZITHROMAX  Take 2 tablets (500 mg) on  Day 1,  followed by 1 tablet (250 mg) once daily on Days 2 through 5.     clotrimazole-betamethasone cream  Commonly known as:  LOTRISONE     EPINEPHrine 0.3 mg/0.3 mL Soaj injection  Commonly known as:  EPI-PEN  Inject 0.3 mg into the muscle once. prn     fluticasone 50 MCG/ACT nasal spray  Commonly known as:  FLONASE     HYDROcodone-acetaminophen 5-325 MG per tablet  Commonly known as:  NORCO/VICODIN  Take 1/2 to 1 tablet every 3 to 4 hours if needed for cough or pain     ibuprofen 600 MG tablet  Commonly known as:  ADVIL,MOTRIN  Take 600 mg by mouth every 6 (six) hours as needed. 2-3     meloxicam 15 MG tablet  Commonly known as:  MOBIC  Take 15 mg by mouth daily. prn     montelukast 10 MG tablet   Commonly known as:  SINGULAIR  Take 10 mg by mouth at bedtime.     pantoprazole 40 MG tablet  Commonly known as:  PROTONIX  Take 40 mg by mouth daily. prn     predniSONE 20 MG tablet  Commonly known as:  DELTASONE  1 tab 3 x day for 3 days, then 1 tab 2 x day for 3 days, then 1 tab 1 x day for 5 days     rOPINIRole 1 MG tablet  Commonly known as:  REQUIP  Take 1 mg by mouth at bedtime. prn     SUMAtriptan 100 MG tablet  Commonly known as:  IMITREX  Take 1 tablet (100 mg total) by mouth once. May take a 2nd pill after 2 hours, no more than 2 per 24 hours and no more than 4 pills a week.     SUMAtriptan 50 MG tablet  Commonly known as:  IMITREX     Testosterone 10 MG/ACT (2%) Gel  Place onto the skin. Patient applies 4 pumps daily     verapamil 120 MG CR tablet  Commonly known as:  CALAN-SR  Take 1 tablet daily with food for BP & Headache prevention     zolpidem 10 MG tablet  Commonly known as:  AMBIEN  Take 10 mg by mouth at bedtime as needed  for sleep (takes 1/2 tablet).     No Known Allergies  PMHx:   Past Medical History  Diagnosis Date  . Reflux   . Environmental allergies   . OSA on CPAP 08/01/2014    There is no immunization history on file for this patient. Past Surgical History  Procedure Laterality Date  . Hemorrhoid surgery  09/2012  . 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 Systems Review:  Constitutional: Denies fever, chills, wt changes, headaches, insomnia, fatigue, night sweats, change in appetite. Eyes: Denies redness, blurred vision, diplopia, discharge, itchy, watery eyes.  ENT: Denies discharge, congestion, post nasal drip, epistaxis, sore throat, earache, hearing loss, dental pain, tinnitus, vertigo, & has recent frontal & maxillary pain congestion sinus pain.  CV: Denies chest pain, palpitations, irregular heartbeat, syncope, dyspnea, diaphoresis, orthopnea, PND,  claudication or edema. Respiratory: admits recent productive cough, no dyspnea, DOE, pleurisy, hoarseness, laryngitis, wheezing.  Gastrointestinal: Denies dysphagia, odynophagia, , abdominal pain or cramps, nausea, vomiting, bloating, diarrhea, constipation, hematemesis, melena, hematochezia  or hemorrhoids. Does report occas triggered heartburn, reflux and water brash. Genitourinary: Denies dysuria, frequency, urgency, nocturia, hesitancy, discharge, hematuria or flank pain. Musculoskeletal: Denies arthralgias, myalgias, stiffness, jt. swelling, pain, limping or strain/sprain.  Skin: Denies pruritus, rash, hives, warts, acne, eczema or change in skin lesion(s). Neuro: No weakness, tremor, incoordination, spasms, paresthesia or pain. Psychiatric: Denies confusion, memory loss or sensory loss. Endo: Denies change in weight, skin or hair change.  Heme/Lymph: No excessive bleeding, bruising or enlarged lymph nodes.  Physical Exam  BP 142/76  Pulse 92  Temp 98.6 F   Resp 16  Ht 5' 9.5"   Wt 246 lb 3.2 oz     BMI 35.85   Appears well nourished and in no distress. Eyes: PERRLA, EOMs, conjunctiva no swelling or erythema. Sinuses: (+) frontal/maxillary tenderness ENT/Mouth: EAC's clear, TM's nl w/o erythema, bulging. Nares clear w/o erythema, swelling, exudates. Oropharynx clear without erythema or exudates. Oral hygiene is good. Tongue normal, non obstructing. Hearing intact.  Neck: Supple. Thyroid nl. Car 2+/2+ without bruits, nodes or JVD. Chest: Scattered rales, rhonchi, No wheezing or stridor.  Cor: Heart sounds normal w/ regular rate and rhythm without sig. murmurs, gallops, clicks, or rubs. Peripheral pulses normal and equal  without edema.  Abdomen: Soft & bowel sounds normal. Non-tender w/o guarding, rebound, hernias, masses, or organomegaly.  Lymphatics: Unremarkable.  Musculoskeletal: Full ROM all peripheral extremities, joint stability, 5/5 strength, and normal gait.  Skin: Warm,  dry without exposed rashes, lesions or ecchymosis apparent.  Neuro: Cranial nerves intact, reflexes equal bilaterally. Sensory-motor testing grossly intact. Tendon reflexes grossly intact.  Pysch: Alert & oriented x 3.  Insight and judgement nl & appropriate. No ideations.  Assessment and Plan:  1. Essential hypertension  - TSH  2. Cluster headache, hx/o   3. OSA treated with BiPAP   4. HLD (hyperlipidemia), screening  - Lipid panel  5. Prediabetes, screening  - Hemoglobin A1c - Insulin, fasting  6. Vitamin D deficiency, screening  - Vit D  25 hydroxy   7. Testosterone deficiency  - check Testosterone level on current therapy, discussed consider parenteral inj therapy.  8. Other fatigue  - Vitamin B12 - Iron and TIBC  9. Medication management  - Urine Microscopic - CBC with Differential - BASIC METABOLIC PANEL WITH GFR - Hepatic function panel - Magnesium  10. Acute frontal  sinusitis  - azithromycin (ZITHROMAX) 250 MG tablet; Take 2 tablets (500 mg) on  Day 1,  followed by 1 tablet (250 mg) once daily on Days 2 through 5.  Dispense: 6 each; Refill: 1 - predniSONE (DELTASONE) 20 MG tablet; 1 tab 3 x day for 3 days, then 1 tab 2 x day for 3 days, then 1 tab 1 x day for 5 days  Dispense: 20 tablet;   11. Acute bronchitis  - HYDROcodone-acetaminophen (NORCO/VICODIN) 5-325 MG per tablet; Take 1/2 to 1 tablet every 3 to 4 hours if needed for cough or pain  Dispense: 50 tablet; Refill: 0 - azithromycin (ZITHROMAX) 250 MG tablet; Take 2 tablets (500 mg) on  Day 1,  followed by 1 tablet (250 mg) once daily on Days 2 through 5.  Dispense: 6 each; Refill: 1 - predniSONE (DELTASONE) 20 MG tablet; 1 tab 3 x day for 3 days, then 1 tab 2 x day for 3 days, then 1 tab 1 x day for 5 days  Dispense: 20 tablet;    12. Morbid obesity (BMI 35.85)  - Discussed diet and weight loss &   Recommended the book "The END of DIETING" by Dr Excell Seltzer   & the book "The END of  DIABETES " by Dr Excell Seltzer   Recommended regular exercise, BP monitoring, weight control, and discussed med and SE's. Recommended labs to assess and monitor clinical status. Further disposition pending results of labs. ROV 1 mo to reassess.

## 2014-11-07 LAB — LIPID PANEL
CHOLESTEROL: 129 mg/dL (ref 0–200)
HDL: 25 mg/dL — ABNORMAL LOW (ref >39)
LDL Cholesterol: 31 mg/dL (ref 0–99)
Total CHOL/HDL Ratio: 5.2 Ratio
Triglycerides: 364 mg/dL — ABNORMAL HIGH (ref <150)
VLDL: 73 mg/dL — ABNORMAL HIGH (ref 0–40)

## 2014-11-07 LAB — IRON AND TIBC
%SAT: 21 % (ref 20–55)
Iron: 59 ug/dL (ref 42–165)
TIBC: 278 ug/dL (ref 215–435)
UIBC: 219 ug/dL (ref 125–400)

## 2014-11-07 LAB — HEMOGLOBIN A1C
HEMOGLOBIN A1C: 5.6 % (ref <5.7)
Mean Plasma Glucose: 114 mg/dL (ref <117)

## 2014-11-07 LAB — HEPATIC FUNCTION PANEL
ALK PHOS: 72 U/L (ref 39–117)
ALT: 52 U/L (ref 0–53)
AST: 30 U/L (ref 0–37)
Albumin: 4.2 g/dL (ref 3.5–5.2)
BILIRUBIN TOTAL: 0.6 mg/dL (ref 0.2–1.2)
Bilirubin, Direct: 0.1 mg/dL (ref 0.0–0.3)
Indirect Bilirubin: 0.5 mg/dL (ref 0.2–1.2)
Total Protein: 6.9 g/dL (ref 6.0–8.3)

## 2014-11-07 LAB — BASIC METABOLIC PANEL WITH GFR
BUN: 16 mg/dL (ref 6–23)
CALCIUM: 9.4 mg/dL (ref 8.4–10.5)
CO2: 31 meq/L (ref 19–32)
CREATININE: 0.86 mg/dL (ref 0.50–1.35)
Chloride: 106 mEq/L (ref 96–112)
GFR, Est Non African American: >89 mL/min
GLUCOSE: 99 mg/dL (ref 70–99)
Potassium: 3.9 mEq/L (ref 3.5–5.3)
SODIUM: 143 meq/L (ref 135–145)

## 2014-11-07 LAB — TESTOSTERONE: Testosterone: 498 ng/dL (ref 300–890)

## 2014-11-07 LAB — INSULIN, FASTING: INSULIN FASTING, SERUM: 29.3 u[IU]/mL — AB (ref 2.0–19.6)

## 2014-11-07 LAB — URINALYSIS, MICROSCOPIC ONLY
Bacteria, UA: NONE SEEN
CRYSTALS: NONE SEEN
Casts: NONE SEEN
SQUAMOUS EPITHELIAL / LPF: NONE SEEN

## 2014-11-07 LAB — VITAMIN D 25 HYDROXY (VIT D DEFICIENCY, FRACTURES): VIT D 25 HYDROXY: 21 ng/mL — AB (ref 30–100)

## 2014-11-07 LAB — VITAMIN B12: Vitamin B-12: 1013 pg/mL — ABNORMAL HIGH (ref 211–911)

## 2014-11-07 LAB — MAGNESIUM: Magnesium: 2.1 mg/dL (ref 1.5–2.5)

## 2014-11-07 LAB — TSH: TSH: 1.118 u[IU]/mL (ref 0.350–4.500)

## 2014-11-29 ENCOUNTER — Ambulatory Visit (INDEPENDENT_AMBULATORY_CARE_PROVIDER_SITE_OTHER): Payer: BLUE CROSS/BLUE SHIELD | Admitting: Internal Medicine

## 2014-11-29 ENCOUNTER — Encounter: Payer: Self-pay | Admitting: Internal Medicine

## 2014-11-29 VITALS — BP 136/76 | HR 72 | Temp 97.9°F | Resp 16 | Ht 69.5 in | Wt 246.4 lb

## 2014-11-29 DIAGNOSIS — G5601 Carpal tunnel syndrome, right upper limb: Secondary | ICD-10-CM

## 2014-11-29 DIAGNOSIS — G5603 Carpal tunnel syndrome, bilateral upper limbs: Secondary | ICD-10-CM

## 2014-11-29 DIAGNOSIS — G5602 Carpal tunnel syndrome, left upper limb: Secondary | ICD-10-CM

## 2014-12-02 ENCOUNTER — Encounter: Payer: Self-pay | Admitting: Internal Medicine

## 2014-12-02 MED ORDER — PREDNISONE 20 MG PO TABS: ORAL_TABLET | ORAL | Status: DC

## 2014-12-02 NOTE — Progress Notes (Signed)
   Subjective:    Patient ID: [name], male    DOB: [birth year], 59 y.o.   MRN: [id number]  HPI patient is a very nice 59 yo RH MWM with c/o bilat forearm to hand pain - worse left greater than right.  Medication Sig  . albuterol (PROVENTIL HFA;VENTOLIN HFA) 108 (90 BASE) MCG/ACT inhaler Inhale 1 puff into the lungs every 6 (six) hours as needed for wheezing or shortness of breath. prn  . clotrimazole-betamethasone (LOTRISONE) cream   . EPINEPHrine 0.3 mg/0.3 mL IJ SOAJ injection Inject 0.3 mg into the muscle once. prn  . fluticasone (FLONASE) 50 MCG/ACT nasal spray   . HYDROcodone-acetaminophen (NORCO/VICODIN) 5-325 MG per tablet Take 1/2 to 1 tablet every 3 to 4 hours if needed for cough or pain  . ibuprofen (ADVIL,MOTRIN) 600 MG tablet Take 600 mg by mouth every 6 (six) hours as needed. 2-3  . montelukast (SINGULAIR) 10 MG tablet Take 10 mg by mouth at bedtime.   . pantoprazole (PROTONIX) 40 MG tablet Take 40 mg by mouth daily. prn  . rOPINIRole (REQUIP) 1 MG tablet Take 1 mg by mouth at bedtime. prn  . SUMAtriptan (IMITREX) 100 MG tablet Take 1 tablet (100 mg total) by mouth once. May take a 2nd pill after 2 hours, no more than 2 per 24 hours and no more than 4 pills a week.  . SUMAtriptan (IMITREX) 50 MG tablet   . Testosterone 10 MG/ACT (2%) GEL Place onto the skin. Patient applies 4 pumps daily  . verapamil (CALAN-SR) 120 MG CR tablet Take 1 tablet daily with food for BP & Headache prevention  . zolpidem (AMBIEN) 10 MG tablet Take 10 mg by mouth at bedtime as needed for sleep (takes 1/2 tablet).  . meloxicam (MOBIC) 15 MG tablet Take 15 mg by mouth daily. prn  . predniSONE (DELTASONE) 20 MG tablet 1 tab 3 x day for 3 days, then 1 tab 2 x day for 3 days, then 1 tab 1 x day for 5 days   No Known Allergies   Past Medical History  Diagnosis Date  . Reflux   . Environmental allergies   . OSA on CPAP 08/01/2014   Past Surgical History  Procedure Laterality Date  . Hemorrhoid  surgery  09/2012  . Appendectomy  07/2012   Review of Systems  10 point systems review is negative except as above    Objective:   Physical Exam BP 136/76 mmHg  Pulse 72  Temp(Src) 97.9 F (36.6 C)  Resp 16  Ht 5' 9.5" (1.765 m)  Wt 246 lb 6.4 oz (111.766 kg)  BMI 35.88 kg/m2  HEENT - Neg Neck - supple. Nl Thyroid. Carotids 2+ & No bruits, nodes, JVD Chest - Clear equal BS w/o Rales, rhonchi, wheezes. Cor - Nl HS. RRR w/o sig MGR. PP 1(+). No edema. MS- FROM w/o deformities. Muscle power, tone and bulk Nl. Gait Nl. Neuro - No obvious Cr N abnormalities. Sensory, motor and Cerebellar functions appear Nl with exception of (+) tinel's sign on the left.  Psyche - Mental status normal & appropriate.  No delusions, ideations or obvious mood abnormalities.    Assessment & Plan:   1. Bilateral carpal tunnel syndrome  - Rx prednisone pulse/taper and patient recommended to wear bilat wrist splints when doing repetitive activities of the wrists. Discussed the pathophysiology of CTS and advised if sx's persist to call for Ortho referral.

## 2014-12-12 ENCOUNTER — Other Ambulatory Visit: Payer: Self-pay | Admitting: *Deleted

## 2014-12-12 MED ORDER — TESTOSTERONE 20.25 MG/1.25GM (1.62%) TD GEL: TRANSDERMAL | Status: DC

## 2014-12-17 ENCOUNTER — Ambulatory Visit (INDEPENDENT_AMBULATORY_CARE_PROVIDER_SITE_OTHER): Payer: BLUE CROSS/BLUE SHIELD | Admitting: Internal Medicine

## 2014-12-17 ENCOUNTER — Encounter: Payer: Self-pay | Admitting: Internal Medicine

## 2014-12-17 VITALS — BP 122/64 | HR 62 | Temp 98.6°F | Resp 18 | Ht 69.5 in | Wt 244.0 lb

## 2014-12-17 DIAGNOSIS — M79642 Pain in left hand: Secondary | ICD-10-CM

## 2014-12-17 DIAGNOSIS — G5603 Carpal tunnel syndrome, bilateral upper limbs: Secondary | ICD-10-CM

## 2014-12-17 DIAGNOSIS — G5601 Carpal tunnel syndrome, right upper limb: Secondary | ICD-10-CM

## 2014-12-17 DIAGNOSIS — N528 Other male erectile dysfunction: Secondary | ICD-10-CM

## 2014-12-17 DIAGNOSIS — G5602 Carpal tunnel syndrome, left upper limb: Secondary | ICD-10-CM

## 2014-12-17 MED ORDER — SILDENAFIL CITRATE 20 MG PO TABS: ORAL_TABLET | ORAL | Status: DC

## 2014-12-17 NOTE — Progress Notes (Signed)
   Subjective:    Patient ID: George Montgomery, male    DOB: 03-24-1956, 59 y.o.   MRN: [id number]  HPI Pt a RH   MWM  returns today for recheck of bilat carpal tunnel syn worse on the left and reports some improvement with rest and elastic support bracing.  He also was treated with a course of prednisone. He also is c/o pain over the 3rd & 4th MCP jts. This has severely disrupted his ability to play his guitar.   Medication Sig  . albuterol (PROVENTIL HFA;VENTOLIN HFA) 108 (90 BASE) MCG/ACT inhaler Inhale 1 puff into the lungs every 6 (six) hours as needed for wheezing or shortness of breath. prn  . clotrimazole-betamethasone (LOTRISONE) cream   . EPINEPHrine 0.3 mg/0.3 mL IJ SOAJ injection Inject 0.3 mg into the muscle once. prn  . fluticasone (FLONASE) 50 MCG/ACT nasal spray   . HYDROcodone-acetaminophen (NORCO/VICODIN) 5-325 MG per tablet Take 1/2 to 1 tablet every 3 to 4 hours if needed for cough or pain  . ibuprofen (ADVIL,MOTRIN) 600 MG tablet Take 600 mg by mouth every 6 (six) hours as needed. 2-3  . montelukast (SINGULAIR) 10 MG tablet Take 10 mg by mouth at bedtime.   . pantoprazole (PROTONIX) 40 MG tablet Take 40 mg by mouth daily. prn  . rOPINIRole (REQUIP) 1 MG tablet Take 1 mg by mouth at bedtime. prn  . SUMAtriptan (IMITREX) 100 MG tablet Take 1 tablet (100 mg total) by mouth once. May take a 2nd pill after 2 hours, no more than 2 per 24 hours and no more than 4 pills a week.  . Testosterone 20.25 MG/1.25GM (1.62%) GEL Apply 4 pumps daily AD  . verapamil (CALAN-SR) 120 MG CR tablet Take 1 tablet daily with food for BP & Headache prevention  . zolpidem (AMBIEN) 10 MG tablet Take 10 mg by mouth at bedtime as needed for sleep (takes 1/2 tablet).  . predniSONE (DELTASONE) 20 MG tablet 1 tab 3 x da - 5 da, then 2 x da - 5 da - then daily x 5 d  . SUMAtriptan (IMITREX) 50 MG tablet    No Known Allergies  Past Medical History  Diagnosis Date  . Reflux   . Environmental allergies   .  OSA on CPAP 08/01/2014   Review of Systems Negative as pertinent above except patient requesting a trial wit Viagra and is given Sx's 50 mg x 2 & Rx for 100 mg & 20 mg     Objective:   Physical Exam   BP 122/64 mmHg  Pulse 62  Temp(Src) 98.6 F (37 C) (Temporal)  Resp 18  Ht 5' 9.5" (1.765 m)  Wt 244 lb (110.678 kg)  BMI 35.53 kg/m2  Exam finds (+) tinel's on the left with decreased grip and tenderness over the left  3rd & 4th MCP jts w/o signs of synovitis.    Assessment & Plan:    1. Bilateral carpal tunnel syndrome  - Ambulatory referral to Orthopedic Surgery  2. Hand pain, left  - Ambulatory referral to Orthopedic Surgery  3. Other male erectile dysfunction  - sildenafil (REVATIO) 20 MG tablet; Take 1 to 5 tablets daily if needed for XXXX  Dispense: 30 tablet; Refill: 99 & instructed in effects/SE's.

## 2015-02-05 ENCOUNTER — Other Ambulatory Visit: Payer: Self-pay | Admitting: *Deleted

## 2015-02-05 MED ORDER — FLUTICASONE PROPIONATE 50 MCG/ACT NA SUSP: 2.0000 | Freq: Every day | NASAL | Status: DC

## 2015-02-05 MED ORDER — MONTELUKAST SODIUM 10 MG PO TABS: 10.0000 mg | ORAL_TABLET | Freq: Every day | ORAL | Status: DC

## 2015-02-13 LAB — HM COLONOSCOPY

## 2015-02-28 ENCOUNTER — Other Ambulatory Visit: Payer: Self-pay

## 2015-02-28 ENCOUNTER — Telehealth: Payer: Self-pay | Admitting: Neurology

## 2015-02-28 MED ORDER — ALBUTEROL SULFATE HFA 108 (90 BASE) MCG/ACT IN AERS: 1.0000 | INHALATION_SPRAY | Freq: Four times a day (QID) | RESPIRATORY_TRACT | Status: DC | PRN

## 2015-02-28 NOTE — Telephone Encounter (Signed)
No orders faxed to [place]. I called [facility], they stated that they faxed it to wrong office and will fax orders again to [place].

## 2015-02-28 NOTE — Telephone Encounter (Signed)
Tanush called back. He stated that [facility] sent [place] a fax for new CPAP supplies. I told him that we will keep an eye out for the fax.

## 2015-02-28 NOTE — Telephone Encounter (Signed)
Left message to call back  

## 2015-02-28 NOTE — Telephone Encounter (Signed)
Patient requested to speak with nurse regarding some issues he has been having with his CPAP machine. Please call and advise.

## 2015-03-01 NOTE — Telephone Encounter (Signed)
We received fax and faxed the orders back to them before close yesterday

## 2015-03-08 ENCOUNTER — Other Ambulatory Visit: Payer: Self-pay | Admitting: Internal Medicine

## 2015-03-08 ENCOUNTER — Telehealth: Payer: Self-pay

## 2015-03-08 NOTE — Telephone Encounter (Signed)
Pt is c/o SOB, elevated BP, and some dizziness. BP was 157/82. Pt is concerned. Please advise.

## 2015-04-04 ENCOUNTER — Encounter: Payer: Self-pay | Admitting: Internal Medicine

## 2015-04-04 ENCOUNTER — Ambulatory Visit (INDEPENDENT_AMBULATORY_CARE_PROVIDER_SITE_OTHER): Payer: BLUE CROSS/BLUE SHIELD | Admitting: Internal Medicine

## 2015-04-04 VITALS — BP 116/64 | HR 64 | Temp 97.5°F | Resp 16 | Ht 69.5 in | Wt 241.0 lb

## 2015-04-04 DIAGNOSIS — L739 Follicular disorder, unspecified: Secondary | ICD-10-CM

## 2015-04-04 DIAGNOSIS — H8309 Labyrinthitis, unspecified ear: Secondary | ICD-10-CM

## 2015-04-04 MED ORDER — SERTRALINE HCL 50 MG PO TABS: 50.0000 mg | ORAL_TABLET | Freq: Every day | ORAL | Status: DC

## 2015-04-04 MED ORDER — DOXYCYCLINE HYCLATE 100 MG PO CAPS: ORAL_CAPSULE | ORAL | Status: AC

## 2015-04-04 MED ORDER — PREDNISONE 20 MG PO TABS
ORAL_TABLET | ORAL | Status: DC
Start: 2015-04-04 — End: 2015-07-18

## 2015-04-04 NOTE — Patient Instructions (Signed)
Folliculitis  Folliculitis is redness, soreness, and swelling (inflammation) of the hair follicles. This condition can occur anywhere on the body. People with weakened immune systems, diabetes, or obesity have a greater risk of getting folliculitis. CAUSES 1. Bacterial infection. This is the most common cause. 2. Fungal infection. 3. Viral infection. 4. Contact with certain chemicals, especially oils and tars. Long-term folliculitis can result from bacteria that live in the nostrils. The bacteria may trigger multiple outbreaks of folliculitis over time. SYMPTOMS Folliculitis most commonly occurs on the scalp, thighs, legs, back, buttocks, and areas where hair is shaved frequently. An early sign of folliculitis is a small, white or yellow, pus-filled, itchy lesion (pustule). These lesions appear on a red, inflamed follicle. They are usually less than 0.2 inches (5 mm) wide. When there is an infection of the follicle that goes deeper, it becomes a boil or furuncle. A group of closely packed boils creates a larger lesion (carbuncle). Carbuncles tend to occur in hairy, sweaty areas of the body. DIAGNOSIS  Your caregiver can usually tell what is wrong by doing a physical exam. A sample may be taken from one of the lesions and tested in a lab. This can help determine what is causing your folliculitis. TREATMENT  Treatment may include:  Applying warm compresses to the affected areas.  Taking antibiotic medicines orally or applying them to the skin.  Draining the lesions if they contain a large amount of pus or fluid.  Laser hair removal for cases of long-lasting folliculitis. This helps to prevent regrowth of the hair. HOME CARE INSTRUCTIONS  Apply warm compresses to the affected areas as directed by your caregiver.  If antibiotics are prescribed, take them as directed. Finish them even if you start to feel better.  You may take over-the-counter medicines to relieve itching.  Do not shave  irritated skin.  Follow up with your caregiver as directed. SEEK IMMEDIATE MEDICAL CARE IF:   You have increasing redness, swelling, or pain in the affected area.  You have a fever. MAKE SURE YOU:  Understand these instructions.  Will watch your condition.  Will get help right away if you are not doing well or get worse. Document Released: 12/14/2001 Document Revised: 04/05/2012 Document Reviewed: 01/05/2012 [facility] Patient Information 2015 [place], [place]. This information is not intended to replace advice given to you by your health care provider. Make sure you discuss any questions you have with your health care provider.   ================================= Benign Positional Vertigo Vertigo means you feel like you or your surroundings are moving when they are not. Benign positional vertigo is the most common form of vertigo. Benign means that the cause of your condition is not serious. Benign positional vertigo is more common in older adults. CAUSES  Benign positional vertigo is the result of an upset in the labyrinth system. This is an area in the middle ear that helps control your balance. This may be caused by a viral infection, head injury, or repetitive motion. However, often no specific cause is found. SYMPTOMS  Symptoms of benign positional vertigo occur when you move your head or eyes in different directions. Some of the symptoms may include: 5. Loss of balance and falls. 6. Vomiting. 7. Blurred vision. 8. Dizziness. 9. Nausea. 10. Involuntary eye movements (nystagmus). DIAGNOSIS  Benign positional vertigo is usually diagnosed by physical exam. If the specific cause of your benign positional vertigo is unknown, your caregiver may perform imaging tests, such as magnetic resonance imaging (MRI) or computed tomography (CT).  TREATMENT  Your caregiver may recommend movements or procedures to correct the benign positional vertigo. Medicines such as meclizine, benzodiazepines,  and medicines for nausea may be used to treat your symptoms. In rare cases, if your symptoms are caused by certain conditions that affect the inner ear, you may need surgery. HOME CARE INSTRUCTIONS   Follow your caregiver's instructions.  Move slowly. Do not make sudden body or head movements.  Avoid driving.  Avoid operating heavy machinery.  Avoid performing any tasks that would be dangerous to you or others during a vertigo episode.  Drink enough fluids to keep your urine clear or pale yellow. SEEK IMMEDIATE MEDICAL CARE IF:   You develop problems with walking, weakness, numbness, or using your arms, hands, or legs.  You have difficulty speaking.  You develop severe headaches.  Your nausea or vomiting continues or gets worse.  You develop visual changes.  Your family or friends notice any behavioral changes.  Your condition gets worse.  You have a fever.  You develop a stiff neck or sensitivity to light. MAKE SURE YOU:   Understand these instructions.  Will watch your condition.  Will get help right away if you are not doing well or get worse. Document Released: 07/13/2006 Document Revised: 12/28/2011 Document Reviewed: 06/25/2011 [facility] Patient Information 2015 [place], [place]. This information is not intended to replace advice given to you by your health care provider. Make sure you discuss any questions you have with your health care provider.  ===================================  Depression Depression refers to feeling sad, low, down in the dumps, blue, gloomy, or empty. In general, there are two kinds of depression: 11. Normal sadness or normal grief. This kind of depression is one that we all feel from time to time after upsetting life experiences, such as the loss of a job or the ending of a relationship. This kind of depression is considered normal, is short lived, and resolves within a few days to 2 weeks. Depression experienced after the loss of a loved  one (bereavement) often lasts longer than 2 weeks but normally gets better with time. 12. Clinical depression. This kind of depression lasts longer than normal sadness or normal grief or interferes with your ability to function at home, at work, and in school. It also interferes with your personal relationships. It affects almost every aspect of your life. Clinical depression is an illness. Symptoms of depression can also be caused by conditions other than those mentioned above, such as:  Physical illness. Some physical illnesses, including underactive thyroid gland (hypothyroidism), severe anemia, specific types of cancer, diabetes, uncontrolled seizures, heart and lung problems, strokes, and chronic pain are commonly associated with symptoms of depression.  Side effects of some prescription medicine. In some people, certain types of medicine can cause symptoms of depression.  Substance abuse. Abuse of alcohol and illicit drugs can cause symptoms of depression. SYMPTOMS Symptoms of normal sadness and normal grief include the following:  Feeling sad or crying for short periods of time.  Not caring about anything (apathy).  Difficulty sleeping or sleeping too much.  No longer able to enjoy the things you used to enjoy.  Desire to be by oneself all the time (social isolation).  Lack of energy or motivation.  Difficulty concentrating or remembering.  Change in appetite or weight.  Restlessness or agitation. Symptoms of clinical depression include the same symptoms of normal sadness or normal grief and also the following symptoms:  Feeling sad or crying all the time.  Feelings  of guilt or worthlessness.  Feelings of hopelessness or helplessness.  Thoughts of suicide or the desire to harm yourself (suicidal ideation).  Loss of touch with reality (psychotic symptoms). Seeing or hearing things that are not real (hallucinations) or having false beliefs about your life or the people  around you (delusions and paranoia). DIAGNOSIS  The diagnosis of clinical depression is usually based on how bad the symptoms are and how long they have lasted. Your health care provider will also ask you questions about your medical history and substance use to find out if physical illness, use of prescription medicine, or substance abuse is causing your depression. Your health care provider may also order blood tests. TREATMENT  Often, normal sadness and normal grief do not require treatment. However, sometimes antidepressant medicine is given for bereavement to ease the depressive symptoms until they resolve. The treatment for clinical depression depends on how bad the symptoms are but often includes antidepressant medicine, counseling with a mental health professional, or both. Your health care provider will help to determine what treatment is best for you. Depression caused by physical illness usually goes away with appropriate medical treatment of the illness. If prescription medicine is causing depression, talk with your health care provider about stopping the medicine, decreasing the dose, or changing to another medicine. Depression caused by the abuse of alcohol or illicit drugs goes away when you stop using these substances. Some adults need professional help in order to stop drinking or using drugs. SEEK IMMEDIATE MEDICAL CARE IF:  You have thoughts about hurting yourself or others.  You lose touch with reality (have psychotic symptoms).  You are taking medicine for depression and have a serious side effect. FOR MORE INFORMATION  National [employer] on Mental Illness: www.nami.[employer]: https://carter.com/ Document Released: 10/02/2000 Document Revised: 02/19/2014 Document Reviewed: 01/04/2012 [facility] Patient Information 2015 [place], [place]. This information is not intended to replace advice given to you by your health care provider. Make sure you  discuss any questions you have with your health care provider.

## 2015-04-04 NOTE — Progress Notes (Signed)
Subjective:    Patient ID: George Montgomery, male    DOB: 1956-04-23, 59 y.o.   MRN: [id number]  HPI Patient presents w/ bilat rash of axilla. Also describes occas classic positional vertigo with positional head changes and associated sensation of dysphoria/motion and imbalance. Further related increased job & personal stressors and a feeling of apathy and melancholy w/o crying. Does admit being irritable, short-fused & moody. Denies SI. Wife now works in his ins office and is supportative  Medication Sig  . albuterol  HFA inhaler Inhale 1 puff into the lungs every 6 (six) hours as needed for wheezing or shortness of breath. prn  . clotrimazole-betamethasone (LOTRISONE)    . EPINEPHrine 0.3 mg/0.3 mL  injection Inject 0.3 mg into the muscle once. prn  . fluticasone (FLONASE)  nasal spray Place 2 sprays into both nostrils daily.  [name] 5-325 MG per tablet Take 1/2 to 1 tablet every 3 to 4 hours if needed for cough or pain  . ibuprofen (ADVIL,MOTRIN) 600 MG tablet Take 600 mg by mouth every 6 (six) hours as needed. 2-3  . montelukast (SINGULAIR) 10 MG tablet Take 1 tablet (10 mg total) by mouth at bedtime.  . sildenafil (REVATIO) 20 MG tablet Take 1 to 5 tablets daily if needed for XXXX  . SUMAtriptan (IMITREX) 100 MG tablet Take 1 tablet (100 mg total) by mouth once. May take a 2nd pill after 2 hours  . verapamil -SR 120 MG CR tablet Take 1 tablet daily with food for BP & Headache prevention  . zolpidem (AMBIEN) 10 MG tablet Take 10 mg by mouth at bedtime as needed for sleep (takes 1/2 tablet).  . Testosterone (1.62%) GEL Apply 4 pumps daily AD  . pantoprazole  40 MG tablet Take 40 mg by mouth daily. prn  . rOPINIRole (REQUIP) 1 MG tablet Take 1 mg by mouth at bedtime. prn   No Known Allergies   Past Medical History  Diagnosis Date  . Reflux   . Environmental allergies   . OSA on CPAP 08/01/2014   Review of Systems In addition to the HPI above,  No Fever-chills,  No Headache, No  changes with Vision or hearing,  No problems swallowing food or Liquids,  No Chest pain or productive Cough or Shortness of Breath,  No Abdominal pain, No Nausea or Vomitting, Bowel movements are regular,  No Blood in stool or Urine,  No dysuria,  No new  bruises,  No new joints pains-aches,  No new weakness, tingling, numbness in any extremity,  No recent weight loss,  No polyuria, polydypsia or polyphagia,  Has Mental Stressors.  A full 10 point Review of Systems was done, except as stated above, all other Review of Systems were negative     Objective:   Physical Exam  BP 116/64 mmHg  Pulse 64  Temp(Src) 97.5 F (36.4 C)  Resp 16  Ht 5' 9.5" (1.765 m)  Wt 241 lb (109.317 kg)  BMI 35.09 kg/m2  HEENT - Eac's patent. TM's Nl. EOM's full. PERRLA. NasoOroPharynx clear. Neck - supple. Nl Thyroid. Carotids 2+ & No bruits, nodes, JVD Chest - Clear equal BS w/o Rales, rhonchi, wheezes. Cor - Nl HS. RRR w/o sig MGR. PP 1(+). No edema. Abd - No palpable organomegaly, masses or tenderness. BS nl. MS- FROM w/o deformities. Muscle power, tone and bulk Nl. Gait Nl. Neuro - No obvious Cr N abnormalities. Sensory, motor and Cerebellar functions appear Nl w/o focal abnormalities. Psyche - Flat  affect with forced smiles.  No delusions, ideations or obvious mood abnormalities. Skin - Bilat follicular type rash of each axillary area.     Assessment & Plan:   1. Folliculitis  - doxycycline (VIBRAMYCIN) 100 MG capsule; Take 2 capsules at once on a full stomach, then take 1 capsule  2 x day on a full stomach for infection for 2 weeks  Dispense: 30 capsule; Refill: 0  2. Labyrinthitis, unspecified laterality  - predniSONE (DELTASONE) 20 MG tablet; 1 tab 3 x day for 3 days, then 1 tab 2 x day for 3 days, then 1 tab 1 x day for 5 days  Dispense: 20 tablet; Refill: 0  3. Dysthymia  - Rx Sertraline 50 mg #30 x 2 rf - take 1/4 tab x 4 d , then 1/2 tab x 8 days then 1 whole tab daily - discussed  meds / SE's- ROV 1 month

## 2015-05-03 ENCOUNTER — Encounter: Payer: Self-pay | Admitting: Internal Medicine

## 2015-05-03 ENCOUNTER — Ambulatory Visit (INDEPENDENT_AMBULATORY_CARE_PROVIDER_SITE_OTHER): Payer: BLUE CROSS/BLUE SHIELD | Admitting: Internal Medicine

## 2015-05-03 VITALS — BP 168/86 | HR 76 | Temp 97.9°F | Resp 16 | Ht 69.5 in | Wt 245.8 lb

## 2015-05-03 DIAGNOSIS — I10 Essential (primary) hypertension: Secondary | ICD-10-CM

## 2015-05-03 DIAGNOSIS — R7303 Prediabetes: Secondary | ICD-10-CM

## 2015-05-03 DIAGNOSIS — R7309 Other abnormal glucose: Secondary | ICD-10-CM

## 2015-05-03 DIAGNOSIS — Z6835 Body mass index (BMI) 35.0-35.9, adult: Secondary | ICD-10-CM

## 2015-05-03 NOTE — Progress Notes (Signed)
  Subjective:    Patient ID: George Montgomery, male    DOB: 07-12-1956, 59 y.o.   MRN: [id number]  HPI  Very nice man returning for 1 month  f/u of labile HTN and also was started on Sertraline and reports feeling  Calmer, less moody. CV sys review is negative.   Medication Sig  . Albuterol  HFA inhaler Inhale 1 puff into the lungs every 6 (six) hours as needed for wheezing or shortness of breath. prn  . ammonium lactate (AMLACTIN) 12 % cream APPLY TO AFFECTED AREA TWICE A DAY  . ANDROGEL PUMP  (1.62%) GEL APPLY 4 PUMPS DAILY AS DIRECTED  . clindamycin (CLEOCIN T) 1 % ext soln USE TWICE A DAY ON BACK  . LOTRISONE cream   . EPINEPHrine 0.3 mg/0.3 mL IJ SOAJ injection Inject 0.3 mg into the muscle once. prn  . fluocinonide cream (LIDEX) 0.05 % APPLY TO KNEES AND ELBOWS TWICE A DAY AS NEEDED  . fluticasone (FLONASE) 50 MCG/ACT nasal spray Place 2 sprays into both nostrils daily.  Marland Kitchen ibuprofen  600 MG tablet Take 600 mg by mouth every 6 (six) hours as needed. 2-3  . montelukast  10 MG tablet Take 1 tablet (10 mg total) by mouth at bedtime.  . Omeprazole 20 Takes  mg daily  . Sertraline 50 MG tablet Take 1 tablet (50 mg total) by mouth daily.  . sildenafil  20 MG tablet Take 1 to 5 tablets daily if needed for XXXX  . SUMAtriptan (IMITREX) 100 MG tablet Take 1 tablet (100 mg total) by mouth once. May take a 2nd pill after 2 hours  . Verapamil R 120 MG CR tablet Take 1 tablet daily with food for BP & Headache prevention   No Known Allergies   Past Medical History  Diagnosis Date  . Reflux   . Environmental allergies   . OSA on CPAP 08/01/2014   Past Surgical History  Procedure Laterality Date  . Hemorrhoid surgery  09/2012  . Appendectomy  07/2012   Review of Systems 10 point systems review negative except as above.    Objective:   Physical Exam   BP 168/86 mmHg - rechecked 146/80  Pulse 76  Temp 97.9 F   Resp 16  Ht 5' 9.5"   Wt 245 lb 12.8 oz     BMI 35.79   HEENT - Eac's  patent. TM's Nl. EOM's full. PERRLA. NasoOroPharynx clear. Neck - supple. Nl Thyroid. Carotids 2+ & No bruits, nodes, JVD Chest - Clear equal BS w/o Rales, rhonchi, wheezes. Cor - Nl HS. RRR w/o sig MGR. PP 1(+). No edema. Abd - No palpable organomegaly, masses or tenderness. BS nl. MS- FROM w/o deformities. Muscle power, tone and bulk Nl. Gait Nl. Neuro - No obvious Cr N abnormalities. Sensory, motor and Cerebellar functions appear Nl w/o focal abnormalities. Psyche - Mental status normal & appropriate.  No delusions, ideations or obvious mood abnormalities.    Assessment & Plan:   1. Essential hypertension  - Meds, SE's reviewed - continue same.

## 2015-06-20 ENCOUNTER — Other Ambulatory Visit: Payer: Self-pay | Admitting: Internal Medicine

## 2015-07-03 ENCOUNTER — Ambulatory Visit: Payer: Self-pay | Admitting: Internal Medicine

## 2015-07-14 ENCOUNTER — Other Ambulatory Visit: Payer: Self-pay | Admitting: Internal Medicine

## 2015-07-18 ENCOUNTER — Ambulatory Visit (INDEPENDENT_AMBULATORY_CARE_PROVIDER_SITE_OTHER): Payer: BLUE CROSS/BLUE SHIELD | Admitting: Neurology

## 2015-07-18 ENCOUNTER — Encounter: Payer: Self-pay | Admitting: Neurology

## 2015-07-18 VITALS — BP 122/62 | HR 66 | Resp 18 | Ht 70.0 in | Wt 238.0 lb

## 2015-07-18 DIAGNOSIS — G4733 Obstructive sleep apnea (adult) (pediatric): Secondary | ICD-10-CM | POA: Diagnosis not present

## 2015-07-18 DIAGNOSIS — E669 Obesity, unspecified: Secondary | ICD-10-CM | POA: Diagnosis not present

## 2015-07-18 DIAGNOSIS — M25561 Pain in right knee: Secondary | ICD-10-CM | POA: Diagnosis not present

## 2015-07-18 DIAGNOSIS — Z9989 Dependence on other enabling machines and devices: Principal | ICD-10-CM

## 2015-07-18 DIAGNOSIS — G44009 Cluster headache syndrome, unspecified, not intractable: Secondary | ICD-10-CM | POA: Diagnosis not present

## 2015-07-18 NOTE — Progress Notes (Signed)
Subjective:    Patient ID: George Montgomery is a 59 y.o. male.  HPI     Interim history:   George Montgomery is a 59 year old right-handed gentleman with an underlying medical  history of cervical and lumbar degenerative spine disease, anxiety, depression (not currently on medication d/t side), asthma, hypertension, obesity, and OSA, who presents for follow-up consultation of his history of obstructive sleep apnea and his history of cluster headaches. I last saw him on 08/27/2014 and he canceled an appointment for December 2015. He was advised to follow-up in one month at the time. I suggested sumatriptan 100 mg strength for abortive treatment of cluster headaches at the time and starting him on verapamil for headache prevention. I asked him to be compliant with BiPAP therapy. I asked him to undergo a brain MRI. He did not pursue this as his headaches improved after he started the verapamil and he started feeling better.  Today, 07/18/2015: I reviewed his BiPAP compliance data from 06/18/2015 through 07/17/2015 which is a total of 30 days during which time he was under percent compliant. Average usage was 8 hours and 57 minutes. He estimates that he sleeps about 6-7 hours on an average night. Sometimes he has to get up to use the bathroom. Average AHI was 1.5 per hour, pressure of 17/13, leak low.  Today, 07/18/2015: He reports feeling better. His headaches improved since he started verapamil long-acting. His primary care physician has been prescribing it since then. He is pleased with how he is doing. He is trying to exercise. He has lost some weight. Unfortunately, he fell in June on his boat and landed on both knees, he has had right knee pain since then. He is in the process of seeking consultation with an orthopedic doctor. He has trazodone available for sleep. These are 150 mg strength tablets. He takes quarter of a pill as needed, certainly not every night. He was able to come off of Ambien. In the  distant past he had tried melatonin I would be willing to try melatonin again. He takes as needed hydrocodone, typically only half a pill once daily. He has an appointment with his primary care physician coming up next month.  Previously:  The patient reports new onset HA since 08/02/14, started abruptly. No triggers noted. He recently had the flu shot. He also stopped his testosterone replacement about a month ago. He does endorse more stress, work related. He has also recently increased his caffeine intake. He has had a daily headache which lasts up to 2 hours. It typically starts in the evening. It is right-sided behind his eye and retro-orbital also in the temporal area and radiates to the back. It is severe. He does get puffiness around the right eye and occasional runny nose on the right side. He does not have any double vision or blurry vision. Glasses have not been checked in over 18 months. He does not have nausea but does have to lie down. Hydrocodone has not helped it only 1 pill and he has to take 2. He has also taken high-dose Motrin, 600 mg, and has tried a muscle relaxant, he has to lie down. He has never had anything like this before. He does not have a history of migraines before. He does not note any neurological symptoms and in between he feels at baseline. He has been reluctant to do anything besides the minimal for fear of exacerbating these headaches. They have been daily since 08/02/2014. He was prescribed Frovatriptan  on 08/21/14 by Dr. Bebe Shaggy, but his insurance did not cover that. He has tried sumatriptan and 50 mg which he felt has helped after about 15-20 minutes.he has found relief with taking a warm bath. He has also tried heat pad on his head, which provided some relief.   The patient recalls being diagnosed with OSA some 12 years ago, severe, per patient. Over the years, he has had about 3 sleep studies, last some 3 years ago. He was originally placed on CPAP and most recently  at the last sleep study he remembers that he was switched to BiPAP. He brought his BiPAP machine in for review and we were able to get a download: This was from 05/01/2014 through 07/31/2014 during which time he used his BiPAP machine every night without any skipped days and his percent used days greater than 4 hours was 100% indicating superb compliance, average usage was 8 hours and 7 minutes approximately. Residual AHI was low at 0.7 per hour and leak was very low at 1 minute and 58 seconds as far as large leak per day dose. His pressure is 17/13 cm.    His typical bedtime is between 10:30 and 11 PM and usually falls asleep quickly. Weight time is between 6 and 6:30 AM. He estimates that he gets about 7 hours of sleep. He denies any morning headaches or severe daytime somnolence. He does have an ESS of 11/24 today. He denies significant restless leg symptoms. His first sleep study may have been about 12 years ago. He gained quite a bit of weight after he quit smoking about 2 years ago. He gets his DME care through [employer].    His Past Medical History Is Significant For: Past Medical History  Diagnosis Date  . Reflux   . Environmental allergies   . OSA on CPAP 08/01/2014  . Arthritis     His Past Surgical History Is Significant For: Past Surgical History  Procedure Laterality Date  . Hemorrhoid surgery  09/2012  . Appendectomy  07/2012    His Family History Is Significant For: Family History  Problem Relation Age of Onset  . Heart disease Mother   . Cancer - Colon Other   . Cancer - Prostate Other   . Stroke Maternal Aunt   . Cancer - Colon Paternal Uncle   . Cancer - Prostate Paternal Uncle   . Macular degeneration Mother     His Social History Is Significant For: Social History   Social History  . Marital Status: Married    Spouse Name: [name]  . Number of Children: 4  . Years of Education: college   Occupational History  .      [employer]   Social History Main Topics  . Smoking status: Former Smoker    Quit date: 12/03/2012  . Smokeless tobacco: Never Used  . Alcohol Use: 1.2 oz/week    2 Standard drinks or equivalent per week     Comment: occ  . Drug Use: No  . Sexual Activity: Not Asked   Other Topics Concern  . None   Social History Narrative   Right handed, caffeine 3-4 cups daily, Married, 4 kids, 9 g kids., FT insurance agent (self employed). 4 yrs college    His Allergies Are:  No Known Allergies:   His Current Medications Are:  Outpatient Encounter Prescriptions as of 07/18/2015  Medication Sig  . albuterol (PROVENTIL HFA;VENTOLIN HFA) 108 (90 BASE) MCG/ACT inhaler Inhale 1 puff  into the lungs every 6 (six) hours as needed for wheezing or shortness of breath. prn  . ANDROGEL PUMP 20.25 MG/ACT (1.62%) GEL APPLY 4 PUMPS DAILY AS DIRECTED  . EPINEPHrine 0.3 mg/0.3 mL IJ SOAJ injection Inject 0.3 mg into the muscle once. prn  . fluticasone (FLONASE) 50 MCG/ACT nasal spray Place 2 sprays into both nostrils daily.  Marland Kitchen HYDROcodone-acetaminophen (NORCO/VICODIN) 5-325 MG per tablet Take 1/2 to 1 tablet every 3 to 4 hours if needed for cough or pain  . ibuprofen (ADVIL,MOTRIN) 600 MG tablet Take 600 mg by mouth every 6 (six) hours as needed. 2-3  . montelukast (SINGULAIR) 10 MG tablet TAKE 1 TABLET BY MOUTH AT BEDTIME.  Marland Kitchen OVER THE COUNTER MEDICATION Takes Omeprazole 20 mg daily  . sertraline (ZOLOFT) 50 MG tablet Take 1 tablet (50 mg total) by mouth daily.  . traZODone (DESYREL) 150 MG tablet TAKE 1/3 TO 1/2 TO 1 TABLET 1 HOUR BEFORE SLEEP  . verapamil (CALAN-SR) 120 MG CR tablet Take 1 tablet daily with food for BP & Headache prevention  . [DISCONTINUED] ammonium lactate (AMLACTIN) 12 % cream APPLY TO AFFECTED AREA TWICE A DAY  . [DISCONTINUED] clindamycin (CLEOCIN T) 1 % external solution USE TWICE A DAY ON BACK  . [DISCONTINUED] clotrimazole-betamethasone (LOTRISONE) cream   . [DISCONTINUED] fluocinonide  cream (LIDEX) 0.05 % APPLY TO KNEES AND ELBOWS TWICE A DAY AS NEEDED  . [DISCONTINUED] predniSONE (DELTASONE) 20 MG tablet 1 tab 3 x day for 3 days, then 1 tab 2 x day for 3 days, then 1 tab 1 x day for 5 days  . [DISCONTINUED] sildenafil (REVATIO) 20 MG tablet Take 1 to 5 tablets daily if needed for XXXX  . [DISCONTINUED] SUMAtriptan (IMITREX) 100 MG tablet Take 1 tablet (100 mg total) by mouth once. May take a 2nd pill after 2 hours, no more than 2 per 24 hours and no more than 4 pills a week.  . [DISCONTINUED] zolpidem (AMBIEN) 10 MG tablet Take 10 mg by mouth at bedtime as needed for sleep (takes 1/2 tablet).   No facility-administered encounter medications on file as of 07/18/2015.  :  Review of Systems:  Out of a complete 14 point review of systems, all are reviewed and negative with the exception of these symptoms as listed below:    Review of Systems  Neurological:       Patient states that he is doing well on CPAP, reports that he never feels fully rested.  Patient reports that his headaches are controlled and has not had one since starting Verapamil.     Objective:  Neurologic Exam  Physical Exam Physical Examination:   Filed Vitals:   07/18/15 1043  BP: 122/62  Pulse: 66  Resp: 18   General Examination: The patient is a very pleasant 59 y.o. male in no acute distress. He appears well-developed and well-nourished and well groomed. He is obese. He is in good spirits today.   HEENT: Normocephalic, atraumatic, pupils are equal, round and reactive to light and accommodation. Funduscopic exam is normal with sharp disc margins noted. Extraocular tracking is good without limitation to gaze excursion or nystagmus noted. Normal smooth pursuit is noted. Hearing is grossly intact. Face is symmetric with normal facial animation and normal facial sensation. Speech is clear with no dysarthria noted. There is no hypophonia. There is no lip, neck/head, jaw or voice tremor. Neck is supple  with full range of passive and active motion. There are no carotid bruits on  auscultation. Oropharynx exam reveals: moderate mouth dryness, adequate dental hygiene and moderate airway crowding, due to thicker soft palate, and larger tongue and redundant soft palate. Mallampati is class II. Tongue protrudes centrally and palate elevates symmetrically. Tonsils are absent.   Chest: Clear to auscultation without wheezing, rhonchi or crackles noted.  Heart: S1+S2+0, regular and normal without murmurs, rubs or gallops noted.   Abdomen: Soft, non-tender and non-distended with normal bowel sounds appreciated on auscultation.  Extremities: There is no pitting edema in the distal lower extremities bilaterally. Pedal pulses are intact.  Skin: Warm and dry without trophic changes noted. There are no varicose veins.he has mild swelling of his left earlobe, it looks like he may have had a little bug bite or a rash on the left earlobe which is probably unrelated to his headaches.  Musculoskeletal: exam reveals no obvious joint deformities, tenderness or joint swelling or erythema, with the exception of mild right knee pain.  Neurologically:  Mental status: The patient is awake, alert and oriented in all 4 spheres. His immediate and remote memory, attention, language skills and fund of knowledge are appropriate. There is no evidence of aphasia, agnosia, apraxia or anomia. Speech is clear with normal prosody and enunciation. Thought process is linear. Mood is normal and affect is normal.  Cranial nerves II - XII are as described above under HEENT exam. In addition: shoulder shrug is normal with equal shoulder height noted. Motor exam: Normal bulk, strength and tone is noted. There is no drift, tremor or rebound. Romberg is negative. Reflexes are 2+ throughout. Babinski: Toes are flexor bilaterally. Fine motor skills and coordination: intact with normal finger taps, normal hand movements, normal rapid alternating  patting, normal foot taps and normal foot agility.  Cerebellar testing: No dysmetria or intention tremor on finger to nose testing. Heel to shin is unremarkable bilaterally. There is no truncal or gait ataxia.  Sensory exam: intact to light touch in the upper and lower extremities.  Gait, station and balance: He stands easily. No veering to one side is noted. No leaning to one side is noted. Posture is age-appropriate and stance is narrow based. Gait shows normal stride length and normal pace. No problems turning are noted. He turns en bloc. Tandem walk is unremarkable. Intact toe and heel stance is noted.               Assessment and Plan:   In summary, LYNDELL ALLAIRE is a very pleasant 59 year old male with an underlying medical  history of cervical and lumbar degenerative spine disease, anxiety, depression, asthma, hypertension, obesity, recent onset of cluster headaches and history of obstructive sleep apnea on BiPAP therapy at home who presents for follow-up consultation for OSA and cluster HAs. He has been fully compliant with BiPAP and good results. He has done well with verapamil for cluster headache prevention. He has now pursued the MRI ordered last time but since he feels better, and his exam is nonfocal we will forego MRI scan at this time. He is congratulated on his BiPAP compliance and he is encouraged to continue to strive for weight loss. He does drink still quite a bit of coffee, about 4 cups per day. He is on low-dose narcotics for his right knee pain. He will be seeing an orthopedic doctor soon as I understand. He is off of Ambien and avoids taking trazodone typically. He is encouraged to try over-the-counter melatonin, 3-5 mg, about an hour to 2 before his projected bedtime.  He has taken sumatriptan and 100 mg strength only sparingly. He took it in the beginning but since the verapamil is working well he has not had to take the Imitrex. I will see him back next year for routine  follow-up. He did not need any refills for me today. I answered all his questions today and he was in agreement. I spent 25 minutes in total face-to-face time with the patient, more than 50% of which was spent in counseling and coordination of care, reviewing test results, reviewing medication and discussing or reviewing the diagnosis of cluster HAs and OSA, its prognosis and treatment options.

## 2015-07-18 NOTE — Patient Instructions (Addendum)
Please continue using your BiPAP regularly. While your insurance requires that you use BiPAP at least 4 hours each night on 70% of the nights, I recommend, that you not skip any nights and use it throughout the night if you can. Getting used to BiPAP and staying with the treatment long term does take time and patience and discipline. Untreated obstructive sleep apnea when it is moderate to severe can have an adverse impact on cardiovascular health and raise her risk for heart disease, arrhythmias, hypertension, congestive heart failure, stroke and diabetes. Untreated obstructive sleep apnea causes sleep disruption, nonrestorative sleep, and sleep deprivation. This can have an impact on your day to day functioning and cause daytime sleepiness and impairment of cognitive function, memory loss, mood disturbance, and problems focussing. Using BiPAP regularly can improve these symptoms.  You can try Melatonin at night for sleep: take 3 to 5 mg one to hours before your bedtime.   Try to lose weight, good job with that!  See your eye doctor for check up and consider seeing ortho. We will forego the brain MRI, as you feel better and exam is good.

## 2015-07-19 ENCOUNTER — Ambulatory Visit: Payer: Self-pay | Admitting: Neurology

## 2015-07-27 ENCOUNTER — Encounter: Payer: Self-pay | Admitting: *Deleted

## 2015-08-06 ENCOUNTER — Other Ambulatory Visit: Payer: Self-pay | Admitting: *Deleted

## 2015-08-06 ENCOUNTER — Encounter: Payer: Self-pay | Admitting: Internal Medicine

## 2015-08-06 ENCOUNTER — Ambulatory Visit (INDEPENDENT_AMBULATORY_CARE_PROVIDER_SITE_OTHER): Payer: BLUE CROSS/BLUE SHIELD | Admitting: Internal Medicine

## 2015-08-06 VITALS — BP 142/80 | HR 64 | Temp 97.3°F | Resp 16 | Ht 69.5 in | Wt 245.4 lb

## 2015-08-06 DIAGNOSIS — M7661 Achilles tendinitis, right leg: Secondary | ICD-10-CM

## 2015-08-06 DIAGNOSIS — Z1211 Encounter for screening for malignant neoplasm of colon: Secondary | ICD-10-CM | POA: Insufficient documentation

## 2015-08-06 DIAGNOSIS — Z23 Encounter for immunization: Secondary | ICD-10-CM

## 2015-08-06 DIAGNOSIS — D509 Iron deficiency anemia, unspecified: Secondary | ICD-10-CM

## 2015-08-06 DIAGNOSIS — I10 Essential (primary) hypertension: Secondary | ICD-10-CM | POA: Diagnosis not present

## 2015-08-06 DIAGNOSIS — K219 Gastro-esophageal reflux disease without esophagitis: Secondary | ICD-10-CM

## 2015-08-06 DIAGNOSIS — R7303 Prediabetes: Secondary | ICD-10-CM

## 2015-08-06 DIAGNOSIS — Z79899 Other long term (current) drug therapy: Secondary | ICD-10-CM

## 2015-08-06 DIAGNOSIS — E559 Vitamin D deficiency, unspecified: Secondary | ICD-10-CM | POA: Diagnosis not present

## 2015-08-06 DIAGNOSIS — E782 Mixed hyperlipidemia: Secondary | ICD-10-CM | POA: Diagnosis not present

## 2015-08-06 DIAGNOSIS — J309 Allergic rhinitis, unspecified: Secondary | ICD-10-CM

## 2015-08-06 DIAGNOSIS — Z6835 Body mass index (BMI) 35.0-35.9, adult: Secondary | ICD-10-CM | POA: Diagnosis not present

## 2015-08-06 DIAGNOSIS — E349 Endocrine disorder, unspecified: Secondary | ICD-10-CM

## 2015-08-06 DIAGNOSIS — E291 Testicular hypofunction: Secondary | ICD-10-CM | POA: Diagnosis not present

## 2015-08-06 DIAGNOSIS — R7309 Other abnormal glucose: Secondary | ICD-10-CM | POA: Insufficient documentation

## 2015-08-06 DIAGNOSIS — E1169 Type 2 diabetes mellitus with other specified complication: Secondary | ICD-10-CM | POA: Insufficient documentation

## 2015-08-06 DIAGNOSIS — Z1212 Encounter for screening for malignant neoplasm of rectum: Secondary | ICD-10-CM

## 2015-08-06 DIAGNOSIS — J209 Acute bronchitis, unspecified: Secondary | ICD-10-CM

## 2015-08-06 DIAGNOSIS — B351 Tinea unguium: Secondary | ICD-10-CM

## 2015-08-06 LAB — LIPID PANEL
CHOLESTEROL: 208 mg/dL — AB (ref 125–200)
HDL: 18 mg/dL — AB (ref >=40)
Total CHOL/HDL Ratio: 11.6 Ratio — ABNORMAL HIGH (ref <=5.0)
Triglycerides: 1213 mg/dL — ABNORMAL HIGH (ref <150)

## 2015-08-06 LAB — TSH: TSH: 2.595 u[IU]/mL (ref 0.350–4.500)

## 2015-08-06 LAB — HEPATIC FUNCTION PANEL
ALK PHOS: 78 U/L (ref 40–115)
ALT: 41 U/L (ref 9–46)
AST: 29 U/L (ref 10–35)
Albumin: 4.3 g/dL (ref 3.6–5.1)
BILIRUBIN TOTAL: 0.5 mg/dL (ref 0.2–1.2)
Bilirubin, Direct: 0.1 mg/dL (ref <=0.2)
Indirect Bilirubin: 0.4 mg/dL (ref 0.2–1.2)
Total Protein: 6.8 g/dL (ref 6.1–8.1)

## 2015-08-06 LAB — CBC WITH DIFFERENTIAL/PLATELET
BASOS ABS: 0 10*3/uL (ref 0.0–0.1)
Basophils Relative: 0 % (ref 0–1)
EOS ABS: 0.2 10*3/uL (ref 0.0–0.7)
Eosinophils Relative: 3 % (ref 0–5)
HCT: 46.7 % (ref 39.0–52.0)
Hemoglobin: 16.1 g/dL (ref 13.0–17.0)
LYMPHS PCT: 37 % (ref 12–46)
Lymphs Abs: 2.1 10*3/uL (ref 0.7–4.0)
MCH: 32.2 pg (ref 26.0–34.0)
MCHC: 34.5 g/dL (ref 30.0–36.0)
MCV: 93.4 fL (ref 78.0–100.0)
MONOS PCT: 11 % (ref 3–12)
MPV: 9.9 fL (ref 8.6–12.4)
Monocytes Absolute: 0.6 10*3/uL (ref 0.1–1.0)
NEUTROS ABS: 2.7 10*3/uL (ref 1.7–7.7)
Neutrophils Relative %: 49 % (ref 43–77)
PLATELETS: 173 10*3/uL (ref 150–400)
RBC: 5 MIL/uL (ref 4.22–5.81)
RDW: 13.6 % (ref 11.5–15.5)
WBC: 5.6 10*3/uL (ref 4.0–10.5)

## 2015-08-06 LAB — BASIC METABOLIC PANEL WITH GFR
BUN: 18 mg/dL (ref 7–25)
CO2: 26 mmol/L (ref 20–31)
Calcium: 9.2 mg/dL (ref 8.6–10.3)
Chloride: 102 mmol/L (ref 98–110)
Creat: 0.81 mg/dL (ref 0.70–1.25)
GFR, Est African American: >89 mL/min (ref >=60)
GFR, Est Non African American: >89 mL/min (ref >=60)
Glucose, Bld: 96 mg/dL (ref 65–99)
POTASSIUM: 4.1 mmol/L (ref 3.5–5.3)
Sodium: 138 mmol/L (ref 135–146)

## 2015-08-06 LAB — HEMOGLOBIN A1C
HEMOGLOBIN A1C: 5.3 % (ref <5.7)
MEAN PLASMA GLUCOSE: 105 mg/dL (ref <117)

## 2015-08-06 LAB — MAGNESIUM: MAGNESIUM: 2.2 mg/dL (ref 1.5–2.5)

## 2015-08-06 LAB — IRON AND TIBC
%SAT: 26 % (ref 15–60)
IRON: 80 ug/dL (ref 50–180)
TIBC: 305 ug/dL (ref 250–425)
UIBC: 225 ug/dL (ref 125–400)

## 2015-08-06 MED ORDER — MOMETASONE FUROATE 50 MCG/ACT NA SUSP: 2.0000 | Freq: Every day | NASAL | Status: DC

## 2015-08-06 MED ORDER — TERBINAFINE HCL 250 MG PO TABS: 250.0000 mg | ORAL_TABLET | Freq: Every day | ORAL | Status: DC

## 2015-08-06 MED ORDER — HYDROCODONE-ACETAMINOPHEN 5-325 MG PO TABS: ORAL_TABLET | ORAL | Status: DC

## 2015-08-06 MED ORDER — RANITIDINE HCL 300 MG PO TABS: ORAL_TABLET | ORAL | Status: DC

## 2015-08-06 MED ORDER — PREDNISONE 20 MG PO TABS: ORAL_TABLET | ORAL | Status: DC

## 2015-08-06 NOTE — Progress Notes (Deleted)
   Subjective:    Patient ID: George Montgomery, male    DOB: 04/19/1956, 59 y.o.   MRN: [id number]  HPI    Review of Systems     Objective:   Physical Exam        Assessment & Plan:

## 2015-08-06 NOTE — Patient Instructions (Addendum)
Recommend Adult Low Dose Aspirin or   coated  Aspirin 81 mg daily   To reduce risk of Colon Cancer 20 %,   Skin Cancer 26 % ,   Melanoma 46%   and   Pancreatic cancer 60%   ++++++++++++++++++++++++++++++++++++++++++++++++++++++  Vitamin D goal   is between 70-100.   Please make sure that you are taking your Vitamin D as directed.   It is very important as a natural anti-inflammatory   helping hair, skin, and nails, as well as reducing stroke and heart attack risk.   It helps your bones and helps with mood.  It also decreases numerous cancer risks so please take it as directed.   Low Vit D is associated with a 200-300% higher risk for CANCER   and 200-300% higher risk for HEART   ATTACK  &  STROKE.   ......................................  It is also associated with higher death rate at younger ages,   autoimmune diseases like Rheumatoid arthritis, Lupus, Multiple Sclerosis.     Also many other serious conditions, like depression, Alzheimer's  Dementia, infertility, muscle aches, fatigue, fibromyalgia - just to name a few.  ++++++++++++++++++++++++++++++++++++++++++++++++  Recommend the book "The END of DIETING" by Dr Joel Fuhrman   & the book "The END of DIABETES " by Dr Joel Fuhrman  At [employer] - get book & Audio CD's     Being diabetic has a  300% increased risk for heart attack, stroke, cancer, and alzheimer- type vascular dementia. It is very important that you work harder with diet by avoiding all foods that are white. Avoid white rice (brown & wild rice is OK), white potatoes (sweetpotatoes in moderation is OK), White bread or wheat bread or anything made out of white flour like bagels, donuts, rolls, buns, biscuits, cakes, pastries, cookies, pizza crust, and pasta (made from white flour & egg whites) - vegetarian pasta or spinach or wheat pasta is OK. Multigrain breads like Arnold's or Pepperidge Farm, or multigrain sandwich thins or flatbreads.  Diet,  exercise and weight loss can reverse and cure diabetes in the early stages.  Diet, exercise and weight loss is very important in the control and prevention of complications of diabetes which affects every system in your body, ie. Brain - dementia/stroke, eyes - glaucoma/blindness, heart - heart attack/heart failure, kidneys - dialysis, stomach - gastric paralysis, intestines - malabsorption, nerves - severe painful neuritis, circulation - gangrene & loss of a leg(s), and finally cancer and Alzheimers.    I recommend avoid fried & greasy foods,  sweets/candy, white rice (brown or wild rice or Quinoa is OK), white potatoes (sweet potatoes are OK) - anything made from white flour - bagels, doughnuts, rolls, buns, biscuits,white and wheat breads, pizza crust and traditional pasta made of white flour & egg white(vegetarian pasta or spinach or wheat pasta is OK).  Multi-grain bread is OK - like multi-grain flat bread or sandwich thins. Avoid alcohol in excess. Exercise is also important.    Eat all the vegetables you want - avoid meat, especially red meat and dairy - especially cheese.  Cheese is the most concentrated form of trans-fats which is the worst thing to clog up our arteries. Veggie cheese is OK which can be found in the fresh produce section at [employer] or [employer] or [employer]  ++++++++++++++++++++++++++++++++++++++++++++++++++ DASH Eating Plan  DASH stands for "Dietary Approaches to Stop Hypertension."   The DASH eating plan is a healthy eating plan that has been shown to reduce high   blood pressure (hypertension). Additional health benefits [place] include reducing the risk of type 2 diabetes mellitus, heart disease, and stroke. The DASH eating plan Poet also help with weight loss.  WHAT DO I NEED TO KNOW ABOUT THE DASH EATING PLAN? For the DASH eating plan, you will follow these general guidelines:  Choose foods with a percent daily value for sodium of less than 5% (as listed on the food  label).  Use salt-free seasonings or herbs instead of table salt or sea salt.  Check with your health care provider or pharmacist before using salt substitutes.  Eat lower-sodium products, often labeled as "lower sodium" or "no salt added."  Eat fresh foods.  Eat more vegetables, fruits, and low-fat dairy products.    Choose whole grains. Look for the word "whole" as the first word in the ingredient list.  Choose fish   Limit sweets, desserts, sugars, and sugary drinks.  Choose heart-healthy fats.  Eat veggie cheese   Eat more home-cooked food and less restaurant, buffet, and fast food.  Limit fried foods.  Cook foods using methods other than frying.  Limit canned vegetables. If you do use them, rinse them well to decrease the sodium.  When eating at a restaurant, ask that your food be prepared with less salt, or no salt if possible.                      WHAT FOODS CAN I EAT?  Seek help from a dietitian for individual calorie needs. Grains Whole grain or whole wheat bread. Brown rice. Whole grain or whole wheat pasta. Quinoa, bulgur, and whole grain cereals. Low-sodium cereals. Corn or whole wheat flour tortillas. Whole grain cornbread. Whole grain crackers. Low-sodium crackers.  Vegetables Fresh or frozen vegetables (raw, steamed, roasted, or grilled). Low-sodium or reduced-sodium tomato and vegetable juices. Low-sodium or reduced-sodium tomato sauce and paste. Low-sodium or reduced-sodium canned vegetables.   Fruits All fresh, canned (in natural juice), or frozen fruits.  Meat and Other Protein Products  All fish and seafood.  Dried beans, peas, or lentils. Unsalted nuts and seeds. Unsalted canned beans. Dairy Low-fat dairy products, such as skim or 1% milk, 2% or reduced-fat cheeses, low-fat ricotta or cottage cheese, or plain low-fat yogurt. Low-sodium or reduced-sodium cheeses.  Fats and Oils Tub margarines without trans fats. Light or reduced-fat mayonnaise  and salad dressings (reduced sodium). Avocado. Safflower, olive, or canola oils. Natural peanut or almond butter.  Other Unsalted popcorn and pretzels. The items listed above Thueson not be a complete list of recommended foods or beverages. Contact your dietitian for more options.  +++++++++++++++++++++++++++++++++++++++++++  WHAT FOODS ARE NOT RECOMMENDED?  Grains/ White flour or wheat flour  White bread. White pasta. White rice. Refined cornbread. Bagels and croissants. Crackers that contain trans fat.  Vegetables  Creamed or fried vegetables. Vegetables in a . Regular canned vegetables. Regular canned tomato sauce and paste. Regular tomato and vegetable juices.  Fruits Dried fruits. Canned fruit in light or heavy syrup. Fruit juice.  Meat and Other Protein Products Meat in general. Fatty cuts of meat. Ribs, chicken wings, bacon, sausage, bologna, salami, chitterlings, fatback, hot dogs, bratwurst, and packaged luncheon meats. Salted nuts and seeds. Canned beans with salt.  Dairy Whole or 2% milk, cream, half-and-half, and cream cheese. Whole-fat or sweetened yogurt. Full-fat cheeses or blue cheese. Nondairy creamers and whipped toppings. Processed cheese, cheese spreads, or cheese curds.  Condiments Onion and garlic salt, seasoned salt, table salt, and sea  salt. Canned and packaged gravies. Worcestershire sauce. Tartar sauce. Barbecue sauce. Teriyaki sauce. Soy sauce, including reduced sodium. Steak sauce. Fish sauce. Oyster sauce. Cocktail sauce. Horseradish. Ketchup and mustard. Meat flavorings and tenderizers. Bouillon cubes. Hot sauce. Tabasco sauce. Marinades. Taco seasonings. Relishes.  Fats and Oils Butter, stick margarine, lard, shortening, ghee, and bacon fat. Coconut, palm kernel, or palm oils. Regular salad dressings.  Pickles and olives. Salted popcorn and pretzels. The items listed above may not be a complete list of foods and beverages to  avoid.   +++++++++++++++++++++++++++++++++++++++++++++++++++++++++++++  GETTING OFF OF PPI's     Nexium/protonix/prilosec/Omeprazole/Dexilant/Aciphex are called PPI's, they are great at healing your stomach but should only be taken for a short period of time.     Recent studies have shown that taken for a long time they  can increase the risk of osteoporosis (weakening of your bones), pneumonia, low magnesium, restless legs, Cdiff (infection that causes diarrhea), DEMENTIA and most recently kidney damage / disease / insufficiency.     Due to this information we want to try to stop the PPI but if you try to stop it abruptly this can cause rebound acid and worsening symptoms.   So this is how we want you to get off the PPI:  - Start taking the nexium/protonix/prilosec/PPI  every other day with  zantac (ranitidine) 2 x a day for 2-4 weeks  - then decrease the PPI to every 3 days while taking the zantac (ranitidine) twice a day the other  days for 2-4  Weeks  - then you can try the zantac (ranitidine) once at night or up to 2 x day as needed.  - you can continue on this once at night or stop all together  - Avoid alcohol, spicy foods, NSAIDS (aleve, ibuprofen) at this time. See foods below.   +++++++++++++++++++++++++++++++++++++++++++  Food Choices for Gastroesophageal Reflux Disease  When you have gastroesophageal reflux disease (GERD), the foods you eat and your eating habits are very important. Choosing the right foods can help ease the discomfort of GERD. WHAT GENERAL GUIDELINES DO I NEED TO FOLLOW?  Choose fruits, vegetables, whole grains, low-fat dairy products, and low-fat meat, fish, and poultry.  Limit fats such as oils, salad dressings, butter, nuts, and avocado.  Keep a food diary to identify foods that cause symptoms.  Avoid foods that cause reflux. These may be different for different people.  Eat frequent small meals instead of three large meals each day.  Eat  your meals slowly, in a relaxed setting.  Limit fried foods.  Cook foods using methods other than frying.  Avoid drinking alcohol.  Avoid drinking large amounts of liquids with your meals.  Avoid bending over or lying down until 2-3 hours after eating.   WHAT FOODS ARE NOT RECOMMENDED? The following are some foods and drinks that may worsen your symptoms:  Vegetables Tomatoes. Tomato juice. Tomato and spaghetti sauce. Chili peppers. Onion and garlic. Horseradish. Fruits Oranges, grapefruit, and lemon (fruit and juice). Meats High-fat meats, fish, and poultry. This includes hot dogs, ribs, ham, sausage, salami, and bacon. Dairy Whole milk and chocolate milk. Sour cream. Cream. Butter. Ice cream. Cream cheese.  Beverages Coffee and tea, with or without caffeine. Carbonated beverages or energy drinks. Condiments Hot sauce. Barbecue sauce.  Sweets/Desserts Chocolate and cocoa. Donuts. Peppermint and spearmint. Fats and Oils High-fat foods, including [place] fries and potato chips. Other Vinegar. Strong spices, such as black pepper, white pepper, red pepper, cayenne, curry powder, cloves, ginger,  and chili powder. Nexium/protonix/prilosec are called PPI's, they are great at healing your stomach but should only be taken for a short period of time.

## 2015-08-06 NOTE — Progress Notes (Signed)
Patient ID: George Montgomery, male   DOB: 01-25-1956, 59 y.o.   MRN: [id number]     This very nice 59 y.o. MWM  presents for follow up with Hypertension, Hyperlipidemia, Pre-Diabetes, Testosterone and Vitamin D Deficiency. He also c/o Rt knee pain & is scheduled to see Dr George Montgomery in 10 days and in add'n c/o a painful Rt heel. Incidentally is concerned about an obviously onychomycotic Rt 1st toe nail and desires Tx.      Also, patient is c/o RLSx's  and is on Omeprazole which likely is interfering w/iron absorption & predisposing to RLS, so he's encouraged to transition Omeprazole to Ranitidine.      Patient is treated for HTN & BP has been controlled at home. Today's BP: (!) 142/80 mmHg. Patient has had no complaints of any cardiac type chest pain, palpitations, dyspnea/orthopnea/PND, dizziness, claudication, or dependent edema.     Hyperlipidemia i with cholesterol controlled with diet, but Trig's elevated.  Last Lipids were at goal with Cholesterol 129; HDL 25*; LDL 31; but elevated Triglycerides 364 on  11/06/2014.     Also, the patient has history of Morbid Obesity (BMI 35+) and consequent PreDiabetes/ Insulin Resistance and has had no symptoms of reactive hypoglycemia, diabetic polys, paresthesias or visual blurring.  Last A1c was  5.6% with elevated insulin of 29.3 showing insulin resistance of prediabetes on 11/06/2014     Further, the patient also has history of Vitamin D Deficiency and supplements vitamin D without any suspected side-effects. Last vitamin D was extremely low at  21 on 11/06/2014 and he did not start on recommended Vit D.   Medication Sig  . albuterol HFA   inhaler Inhale 1 puff into the lungs every 6 (six) hours as needed for wheezing or shortness of breath. prn  . ANDROGEL PUMP  (1.62%) GEL APPLY 4 PUMPS DAILY AS DIRECTED  . EPINEPHrine 0.3 mg/0.3 mL  inj Inject 0.3 mg into the muscle once. prn  . FLONASE nasal spray Place 2 sprays into both nostrils daily.  George Montgomery 5-325 MG   Take 1/2 to 1 tablet every 3 to 4 hours if needed for cough or pain  . ibuprofen  600 MG tablet Take 600 mg by mouth every 6 (six) hours as needed. 2-3  . Montelukast 10 MG tablet TAKE 1 TABLET BY MOUTH AT BEDTIME.  Marland Kitchen Omeprazole 20 mg  daily  . sertraline  50 MG tablet Take 1 tablet (50 mg total) by mouth daily.  . traZODone 150 MG tablet TAKE 1/3 TO 1/2 TO 1 TABLET 1 HOUR BEFORE SLEEP  . verapamil -SR 120 MG CR  Take 1 tablet daily with food for BP & Headache prevention   No Known Allergies  PMHx:   Past Medical History  Diagnosis Date  . Reflux   . Environmental allergies   . OSA on CPAP 08/01/2014  . Arthritis     Past Surgical History  Procedure Laterality Date  . Hemorrhoid surgery  09/2012  . Appendectomy  07/2012   FHx:    Reviewed / unchanged  SHx:    Reviewed / unchanged  Systems Review:  Constitutional: Denies fever, chills, wt changes, headaches, insomnia, fatigue, night sweats, change in appetite. Eyes: Denies redness, blurred vision, diplopia, discharge, itchy, watery eyes.  ENT: Denies discharge, congestion, post nasal drip, epistaxis, sore throat, earache, hearing loss, dental pain, tinnitus, vertigo, sinus pain, snoring.  CV: Denies chest pain, palpitations, irregular heartbeat, syncope, dyspnea, diaphoresis, orthopnea, PND, claudication or edema.  Respiratory: denies cough, dyspnea, DOE, pleurisy, hoarseness, laryngitis, wheezing.  Gastrointestinal: Denies dysphagia, odynophagia, heartburn, reflux, water brash, abdominal pain or cramps, nausea, vomiting, bloating, diarrhea, constipation, hematemesis, melena, hematochezia  or hemorrhoids. Genitourinary: Denies dysuria, frequency, urgency, nocturia, hesitancy, discharge, hematuria or flank pain. Musculoskeletal: Denies arthralgias, myalgias, stiffness, jt. swelling, pain, limping or strain/sprain.  Skin: Denies pruritus, rash, hives, warts, acne, eczema or change in skin lesion(s). Neuro: No weakness, tremor,  incoordination, spasms, paresthesia or pain. Psychiatric: Denies confusion, memory loss or sensory loss. Endo: Denies change in weight, skin or hair change.  Heme/Lymph: No excessive bleeding, bruising or enlarged lymph nodes.  Physical Exam  BP 142/80 mmHg  Pulse 64  Temp(Src) 97.3 F (36.3 C)  Resp 16  Ht 5' 9.5" (1.765 m)  Wt 245 lb 6.4 oz (111.313 kg)  BMI 35.73 kg/m2  Appears well nourished and in no distress. Eyes: PERRLA, EOMs, conjunctiva no swelling or erythema. Sinuses: No frontal/maxillary tenderness ENT/Mouth: EAC's clear, TM's nl w/o erythema, bulging. Nares clear w/o erythema, swelling, exudates. Oropharynx clear without erythema or exudates. Oral hygiene is good. Tongue normal, non obstructing. Hearing intact.  Neck: Supple. Thyroid nl. Car 2+/2+ without bruits, nodes or JVD. Chest: Respirations nl with BS clear & equal w/o rales, rhonchi, wheezing or stridor.  Cor: Heart sounds normal w/ regular rate and rhythm without sig. murmurs, gallops, clicks, or rubs. Peripheral pulses normal and equal  without edema.  Abdomen: Soft & bowel sounds normal. Non-tender w/o guarding, rebound, hernias, masses, or organomegaly.  Lymphatics: Unremarkable.  Musculoskeletal: Full ROM all peripheral extremities with exception of painful ROM Rt knee and limping gait favoring the RLE.  Tender insertion of Rt Achilles on the Calcaneous. Skin: Warm, dry without exposed rashes, lesions or ecchymosis apparent.  4 (+) dystrophic Rt 1st toenail Neuro: Cranial nerves intact, reflexes equal bilaterally. Sensory-motor testing grossly intact. Tendon reflexes grossly intact.  Pysch: Alert & oriented x 3.  Insight and judgement nl & appropriate. No ideations.  Assessment and Plan:  1. Essential hypertension  - TSH  2. Prediabetes  - Hemoglobin A1c - Insulin, random  3. Mixed hyperlipidemia  - Lipid panel  4. Vitamin D deficiency  - Vit D  25 hydroxy   5. Testosterone deficiency  -  Testosterone  6. Morbid obesity (Omega)   7. BMI 35.73,  adult   8. Anemia, iron deficiency  - Iron and TIBC  9. Onychomycosis  - terbinafine (LAMISIL) 250 MG tablet; Take 1 tablet (250 mg total) by mouth daily.  Disp: 90 tablet; Refill: 0  - Needs 6 wk lab to recheck LFT's.  10. Gastroesophageal reflux disease,  - ranitidine  300 MG tablet; Take 1 to 2 tablets daily for heartburn & reflux to allow wean and transition from PPI / pantoprazole  Disp: 180 tablet; Refill: 99  11. Tendonitis, Achilles, right  - predniSONE20 MG tablet; 1 tab 3 x day for 3 days, then 1 tab 2 x day for 3 days, then 1 tab 1 x day for 5 days  Dispense: 20 tab  - has 10 day f/u with Dr George Montgomery to check Right Knee.  12. Allergic rhinitis, unspecified allergic rhinitis type  - NASONEX nasal spray; Place 2 sprays into the nose daily.  Dispense: 16 g; Refill: 99  13. Need for prophylactic vaccination and inoculation against influenza  - Flu vaccine > 3yo with preservative IM (Fluvirin Influenza Split)  14. Medication management  - CBC with Differential/Platelet - BASIC METABOLIC PANEL WITH GFR -  Hepatic function panel - Magnesium   Recommended regular exercise, BP monitoring, weight control, and discussed med and SE's. Recommended labs to assess and monitor clinical status. Further disposition pending results of labs. Over 30 minutes of exam, counseling, chart review was performed

## 2015-08-07 ENCOUNTER — Other Ambulatory Visit: Payer: Self-pay | Admitting: Internal Medicine

## 2015-08-07 DIAGNOSIS — E781 Pure hyperglyceridemia: Secondary | ICD-10-CM

## 2015-08-07 LAB — VITAMIN D 25 HYDROXY (VIT D DEFICIENCY, FRACTURES): VIT D 25 HYDROXY: 39 ng/mL (ref 30–100)

## 2015-08-07 LAB — TESTOSTERONE: TESTOSTERONE: 727 ng/dL (ref 300–890)

## 2015-08-07 LAB — INSULIN, RANDOM: Insulin: 13.2 u[IU]/mL (ref 2.0–19.6)

## 2015-08-07 MED ORDER — FENOFIBRATE MICRONIZED 134 MG PO CAPS: ORAL_CAPSULE | ORAL | Status: DC

## 2015-08-16 ENCOUNTER — Other Ambulatory Visit: Payer: Self-pay

## 2015-08-16 MED ORDER — PREDNISONE 20 MG PO TABS: 20.0000 mg | ORAL_TABLET | Freq: Three times a day (TID) | ORAL | Status: DC

## 2015-08-16 MED ORDER — AZITHROMYCIN 250 MG PO TABS: ORAL_TABLET | ORAL | Status: AC

## 2015-08-18 ENCOUNTER — Other Ambulatory Visit: Payer: Self-pay | Admitting: Internal Medicine

## 2015-08-22 ENCOUNTER — Other Ambulatory Visit: Payer: Self-pay | Admitting: Internal Medicine

## 2015-09-10 ENCOUNTER — Other Ambulatory Visit: Payer: Self-pay | Admitting: Orthopaedic Surgery

## 2015-09-10 DIAGNOSIS — M25561 Pain in right knee: Secondary | ICD-10-CM

## 2015-09-18 ENCOUNTER — Other Ambulatory Visit: Payer: BLUE CROSS/BLUE SHIELD

## 2015-09-18 ENCOUNTER — Other Ambulatory Visit: Payer: Self-pay | Admitting: Internal Medicine

## 2015-09-18 ENCOUNTER — Encounter (INDEPENDENT_AMBULATORY_CARE_PROVIDER_SITE_OTHER): Payer: Self-pay

## 2015-09-18 DIAGNOSIS — E782 Mixed hyperlipidemia: Secondary | ICD-10-CM

## 2015-09-18 LAB — LIPID PANEL
CHOLESTEROL: 195 mg/dL (ref 125–200)
HDL: 31 mg/dL — AB (ref >=40)
LDL Cholesterol: 89 mg/dL (ref <130)
TRIGLYCERIDES: 377 mg/dL — AB (ref <150)
Total CHOL/HDL Ratio: 6.3 Ratio — ABNORMAL HIGH (ref <=5.0)
VLDL: 75 mg/dL — ABNORMAL HIGH (ref <30)

## 2015-09-18 MED ORDER — PREDNISONE 20 MG PO TABS: 20.0000 mg | ORAL_TABLET | Freq: Three times a day (TID) | ORAL | Status: DC

## 2015-09-19 ENCOUNTER — Other Ambulatory Visit: Payer: Self-pay | Admitting: Internal Medicine

## 2015-09-20 ENCOUNTER — Ambulatory Visit
Admission: RE | Admit: 2015-09-20 | Discharge: 2015-09-20 | Disposition: A | Discharge status: Home / Self Care | Payer: BLUE CROSS/BLUE SHIELD | Source: Ambulatory Visit | Attending: Orthopaedic Surgery | Admitting: Orthopaedic Surgery

## 2015-09-20 DIAGNOSIS — M25561 Pain in right knee: Secondary | ICD-10-CM

## 2015-10-11 ENCOUNTER — Other Ambulatory Visit: Payer: Self-pay | Admitting: Internal Medicine

## 2015-10-24 ENCOUNTER — Encounter: Payer: BLUE CROSS/BLUE SHIELD | Attending: Internal Medicine | Admitting: *Deleted

## 2015-10-24 ENCOUNTER — Encounter: Payer: Self-pay | Admitting: *Deleted

## 2015-10-24 DIAGNOSIS — E669 Obesity, unspecified: Secondary | ICD-10-CM

## 2015-10-24 DIAGNOSIS — Z6836 Body mass index (BMI) 36.0-36.9, adult: Secondary | ICD-10-CM | POA: Insufficient documentation

## 2015-10-24 DIAGNOSIS — Z713 Dietary counseling and surveillance: Secondary | ICD-10-CM | POA: Insufficient documentation

## 2015-10-24 NOTE — Patient Instructions (Signed)
Exercise suggestions:  Walking at slow-moderate pace on incline.  5 days/week up to 60 minutes total.  You can increase your pace, but not the duration  Consider adding in resistance training to increase lean body mass- use [employer], upper body exercise mostly.  Don't stand while doing lower body exercises.   Start with 3 days/weel and no more than 4.     10-12 reps, 2-3 times.  Want the last couple reps to be challenging, then increase gradually.  Start low and go slow

## 2015-10-24 NOTE — Progress Notes (Signed)
Medical Nutrition Therapy:  Appt start time: 0915 end time:  [id number].   Assessment:  Primary concerns today: George Montgomery is here with his wife for nutrition counseling pertaining to desire to lose weight.  Has tried Weight Watchers and a dietitian, but was not helpful. He would like a plan and would like to be held accountable. Weight Watchers was helpful, but he felt he had to stay on it forever, and couldn't keep the diet for long.   States he quit smoking and "blew up" (weight) Current 250 lb- gained 8 pounds over the holidays, per patient.  He has injured his knee and has not been able to be physically active.  Was prescribed Neurotin and can't take that medication, but reports improved symptoms.  Started back on treadmill a few days ago.   Currently at heaviest weight Lowest adult weight:150 lb Normal weight: 200 lb  Would like to weigh: 190 lb  Wife does the grocery shopping and the cooking.  Jontae does sometimes.  They do not eat out often and that has always been the case.  When at home he eats in the kitchen without distractions.  He is a fast eater, per wife.  He takes large bites and doesn't chew thoroughly.  He eats at specifically scheduled time, regardless of if he is hungry or not.  He eats until physically uncomfortable sometimes.  He has been logging his foods and exercise and Is trying to net 1500 kcal/day and per app and often doesn't net that much; sometimes he nets more, but most often nets <1500 kcal.  Feels like he is a "nervous eater."  Doesn't know fullness cues, because he "eats too fast."   Preferred Learning Style:   Auditory  Visual   Learning Readiness:   Contemplating: ready to follow instruction, but would like prescribed meal plan, not nutrition education/counseling  MEDICATIONS: see list   DIETARY INTAKE:  Usual eating pattern includes 3 meals and 2 snacks per day. Avoided foods include none.    24-hr recall:  B ( AM): 1/2 omelete with vegetables and cheese .   Water and coffee.  Toast and jelly sandwich.  Whole wheat english muffin with jelly Snk ( AM): none  L ( PM): chicken noodle soup, water.  Black forrest ham sandwich with lettuce and tomato.  [employer] flatbread Snk ( PM): sometimes omato sandwich on wheat D ( PM): asparagus, squash, baked chicken.  Pulled pork BBQ, cream potato soup, green peas.  3 oz steak with squash.  Chicken breast, green beans, squash, water Snk ( PM): small ice cream cone and crackers Beverages: 2 cokes, coffee with cream and Splenda, water.  3 glasses wine lately.  Prior to holidays, couple glasses/week normally   Usual physical activity: none for October, just started back  Estimated energy needs: 2800 calories for maintenance 2300 calories for weight loss 275-300 g carbohydrates 110-120 g protein 73-80 g fat  Nutritional Diagnosis:  NB-1.1 Food and nutrition-related knowledge deficit As related to meal planning.  As evidenced by historical dependence on prescribed diet plans.    Intervention:  Nutrition counseling provided.  Discussed ineffective nature of meal plans and how nutrition education will help Other make food choices himself without reliance on professional.  Discussed ineffectiveness of dieting, as 95% of diets don't work.  Discussed HAES principles and focusing on health, not weight and making changes to improve his lipid parameters. When we don't get enough fuel, our bodies suffer the metabolic consequences.  Encouraged patient to  honor their body's internal hunger and fullness cues.  Throughout the day, check in mentally and rate hunger.  Try not to eat when ravenous, but instead when slightly hungry.   Sit down to enjoy those foods.  Minimize distractions: turn off tv, put away books, work, Brewing technologist.  Make the meal last at least 20 minutes in order to give time to experience and register satiety.  Stop eating when full regardless of how much food is left on the plate.  Get more if still hungry.   The key is to honor fullness so throughout the meal, rate fullness factor and stop when comfortably full, but not stuffed.  Gave recommendations for exercise for his injury as well as increasing strength training  Patient voiced disagreement and dissatisfaction.  Wife encouraged attempting nutrition suggestions.  Teaching Method Utilized: Auditory   Barriers to learning/adherence to lifestyle change: patient desire for prescribed diet plan  Demonstrated degree of understanding via:  Teach Back   Monitoring/Evaluation:  Dietary intake, exercise, labs, and body weight prn.

## 2015-11-13 ENCOUNTER — Encounter: Payer: BLUE CROSS/BLUE SHIELD | Admitting: *Deleted

## 2015-11-13 ENCOUNTER — Encounter: Payer: Self-pay | Admitting: Internal Medicine

## 2015-11-13 NOTE — Progress Notes (Signed)
  Medical Nutrition Therapy:  Appt start time: 1000 end time:  1100.  Assessment:  Primary concerns today: patient here with his wife who appears supportive. He wants to lose weight and wants direction as to how to do so. He has had appointments with other dietitians but did not feel he understood what to do. He also expresses interest in follow up to map his progress. He has a history of knee injury but states he has been using his treadmill on an incline and an elliptical machine daily at home for about 30 minutes each for the past 2 weeks.   Preferred Learning Style: No preference indicated   Learning Readiness: Ready  Change in progress  MEDICATIONS: see list   DIETARY INTAKE:  Usual eating pattern includes 3 meals and 1-2 snacks per day.  Everyday foods include fairly good variety of all food groups.  Avoided foods include none stated.    24-hr recall:  B ( AM): omelet, 1 slice toast  Snk ( AM):   L ( PM): 1/2 sandwich and bowl of soup  Snk ( PM):  D ( PM): meat, 2 vegetables, occasional starch Snk ( PM): milk Beverages: water, regular soda but has cut back to one a day, milk  TANITA  BODY COMP RESULTS 11/13/15  Weight (lbs) 248.5   BMI (kg/m^2) 36.7   Fat Mass (lbs) 96.0   Fat Free Mass (lbs) 152.5   Total Body Water (lbs) 111.5  This is baseline on Tanita Scale  Usual physical activity: his treadmill on an incline and an elliptical machine daily at home for about 30 minutes each for the past 2 weeks   Estimated energy needs: 2000 calories 225 g carbohydrates 150 g protein 58 g fat  Progress Towards Goal(s):  In progress.   Nutritional Diagnosis:  NI-1.5 History of excessive energy intake As related to activity level.  As evidenced by BMI of 36.1.    Intervention:  Nutrition counseling for weight loss initiated. Discussed Carb Counting by food group as method of portion control, reading food labels, and benefits of increased activity. Weighed patient on Tanita  Scale at end of visit, will review findings and progress when weigh him again at his next visit.  Plan:  Aim for 3 Carb Choices per meal (45 grams) +/- 1 either way  Aim for 0-2 Carbs per snack if hungry  Include protein in moderation with your meals and snacks Consider reading food labels for Total Carbohydrate of foods Continue with your activity level daily as tolerated   Teaching Method Utilized: Visual, Auditory and Hands on  Handouts given during visit include: Carb Counting and Food Label handouts Meal Plan Card  Tanita Scale results  Barriers to learning/adherence to lifestyle change: none  Demonstrated degree of understanding via:  Teach Back   Monitoring/Evaluation:  Dietary intake, exercise, reading food labels, and body weight in 4-6 week(s).

## 2015-11-15 ENCOUNTER — Encounter: Payer: Self-pay | Admitting: *Deleted

## 2015-11-15 NOTE — Patient Instructions (Signed)
Plan:  Aim for 3 Carb Choices per meal (45 grams) +/- 1 either way  Aim for 0-2 Carbs per snack if hungry  Include protein in moderation with your meals and snacks Consider reading food labels for Total Carbohydrate of foods Continue with your activity level daily as tolerated

## 2015-11-19 ENCOUNTER — Other Ambulatory Visit: Payer: Self-pay | Admitting: Internal Medicine

## 2015-12-18 ENCOUNTER — Encounter: Payer: BLUE CROSS/BLUE SHIELD | Attending: Internal Medicine | Admitting: *Deleted

## 2015-12-18 VITALS — Ht 69.5 in | Wt 251.3 lb

## 2015-12-18 DIAGNOSIS — Z6836 Body mass index (BMI) 36.0-36.9, adult: Secondary | ICD-10-CM | POA: Insufficient documentation

## 2015-12-18 DIAGNOSIS — E669 Obesity, unspecified: Secondary | ICD-10-CM | POA: Diagnosis present

## 2015-12-18 DIAGNOSIS — Z713 Dietary counseling and surveillance: Secondary | ICD-10-CM | POA: Insufficient documentation

## 2015-12-18 NOTE — Patient Instructions (Addendum)
Plan:  Aim for 3 Carb Choices per meal (45 grams) +/- 1 either way  Aim for 0-2 Carbs per snack if hungry  Include protein in moderation with your meals and snacks Consider reading food labels for Total Carbohydrate of foods Continue with your activity level daily as tolerated You have decided to limit alcohol beverage intake to one night a week (each 5 oz glass of red wine is 125 calories and each bourbon and coke is 300 calories) FYI: it takes 3500 calories of exercise to spend 1 pound of fat so a goal of 500 calories of exercise per day = 1 pound weight loss per week

## 2015-12-20 ENCOUNTER — Encounter: Payer: Self-pay | Admitting: *Deleted

## 2015-12-20 NOTE — Progress Notes (Signed)
Medical Nutrition Therapy:  Appt start time: 1130 end time:  1200.  Assessment:  Primary concerns today: patient here for follow up visit with his wife who appears supportive. He states he feels he has been successful with increasing his activity level with more yard work and walking on the treadmill for at least 30 minutes each day. He and his wife state they are using smaller plates, he is eating smaller portions and eating more slowly than he used to. He is aware of a slight weight gain since his last visit a month ago, however, our Tanita Scale is not working today, so I cannot assess his % of fat to fat free mass as I had hoped to do. He also reported an alcohol beverage intake of 2-4 drinks per day typically as well as multiple snacks in the evening time.   Preferred Learning Style: No preference indicated   Learning Readiness: Ready  Change in progress  MEDICATIONS: see list   DIETARY INTAKE:  Usual eating pattern includes 3 meals and 1-2 snacks per day.  Everyday foods include fairly good variety of all food groups.  Avoided foods include none stated.    24-hr recall:  B ( AM): omelet, 1 slice toast  Snk ( AM):   L ( PM): 1/2 sandwich and bowl of soup  Snk ( PM):  D ( PM): meat, 2 vegetables, occasional starch Snk ( PM): milk Beverages: water, regular soda but has cut back to one a day, milk  TANITA  BODY COMP RESULTS 11/13/15  Weight (lbs) 248.5   BMI (kg/m^2) 36.7   Fat Mass (lbs) 96.0   Fat Free Mass (lbs) 152.5   Total Body Water (lbs) 111.5  This is baseline on Tanita Scale  Unable to weigh patient on Tanita Scale today, it is out of order.   Usual physical activity: his treadmill on an incline and an elliptical machine daily at home for about 30 minutes each for the past 2 weeks   Estimated energy needs: 2000 calories 225 g carbohydrates 150 g protein 58 g fat  Progress Towards Goal(s):  In progress.   Nutritional Diagnosis:  NI-1.5 History of excessive  energy intake As related to activity level.  As evidenced by BMI of 36.1.    Intervention:  Nutrition counseling for weight loss continued. Reviewed Carb Counting by food group as method of portion control, patient states he is very comfortable with carb counting and reading food labels. I commended him on his level of activity. We also discussed the calorie amount of the alcoholic beverages he has been consuming and he decided he would limit that to one night a week.  Plan:  Aim for 3 Carb Choices per meal (45 grams) +/- 1 either way  Aim for 0-2 Carbs per snack if hungry  Include protein in moderation with your meals and snacks Consider reading food labels for Total Carbohydrate of foods Continue with your activity level daily as tolerated You have decided to limit alcohol beverage intake to one night a week (each 5 oz glass of red wine is 125 calories and each bourbon and coke is 300 calories) FYI: it takes 3500 calories of exercise to spend 1 pound of fat so a goal of 500 calories of exercise per day = 1 pound weight loss per week    Teaching Method Utilized: Visual, Auditory and Hands on  Handouts given during visit include:  No new handouts today  Barriers to learning/adherence to lifestyle change: none  Demonstrated degree of understanding via:  Teach Back   Monitoring/Evaluation:  Dietary intake, exercise, reading food labels, and body weight in 4-6 week(s).

## 2015-12-25 ENCOUNTER — Other Ambulatory Visit: Payer: Self-pay | Admitting: Internal Medicine

## 2015-12-25 DIAGNOSIS — J40 Bronchitis, not specified as acute or chronic: Secondary | ICD-10-CM

## 2015-12-25 MED ORDER — PROMETHAZINE-DM 6.25-15 MG/5ML PO SYRP: ORAL_SOLUTION | ORAL | Status: DC

## 2015-12-25 MED ORDER — PREDNISONE 20 MG PO TABS: ORAL_TABLET | ORAL | Status: DC

## 2015-12-25 MED ORDER — AZITHROMYCIN 250 MG PO TABS: ORAL_TABLET | ORAL | Status: DC

## 2015-12-27 ENCOUNTER — Ambulatory Visit (INDEPENDENT_AMBULATORY_CARE_PROVIDER_SITE_OTHER): Payer: Self-pay | Admitting: Internal Medicine

## 2015-12-27 ENCOUNTER — Encounter: Payer: Self-pay | Admitting: Internal Medicine

## 2015-12-27 VITALS — BP 160/84 | HR 72 | Temp 97.7°F | Resp 16 | Ht 69.5 in | Wt 250.8 lb

## 2015-12-27 DIAGNOSIS — E782 Mixed hyperlipidemia: Secondary | ICD-10-CM

## 2015-12-27 DIAGNOSIS — Z0000 Encounter for general adult medical examination without abnormal findings: Secondary | ICD-10-CM | POA: Diagnosis not present

## 2015-12-27 DIAGNOSIS — I10 Essential (primary) hypertension: Secondary | ICD-10-CM

## 2015-12-27 DIAGNOSIS — G4733 Obstructive sleep apnea (adult) (pediatric): Secondary | ICD-10-CM

## 2015-12-27 DIAGNOSIS — Z79899 Other long term (current) drug therapy: Secondary | ICD-10-CM | POA: Diagnosis not present

## 2015-12-27 DIAGNOSIS — Z125 Encounter for screening for malignant neoplasm of prostate: Secondary | ICD-10-CM

## 2015-12-27 DIAGNOSIS — E559 Vitamin D deficiency, unspecified: Secondary | ICD-10-CM | POA: Diagnosis not present

## 2015-12-27 DIAGNOSIS — R5383 Other fatigue: Secondary | ICD-10-CM

## 2015-12-27 DIAGNOSIS — Z0001 Encounter for general adult medical examination with abnormal findings: Secondary | ICD-10-CM

## 2015-12-27 DIAGNOSIS — Z136 Encounter for screening for cardiovascular disorders: Secondary | ICD-10-CM | POA: Diagnosis not present

## 2015-12-27 DIAGNOSIS — E349 Endocrine disorder, unspecified: Secondary | ICD-10-CM

## 2015-12-27 DIAGNOSIS — F329 Major depressive disorder, single episode, unspecified: Secondary | ICD-10-CM

## 2015-12-27 DIAGNOSIS — F32A Depression, unspecified: Secondary | ICD-10-CM

## 2015-12-27 DIAGNOSIS — J209 Acute bronchitis, unspecified: Secondary | ICD-10-CM

## 2015-12-27 DIAGNOSIS — K219 Gastro-esophageal reflux disease without esophagitis: Secondary | ICD-10-CM

## 2015-12-27 DIAGNOSIS — R7303 Prediabetes: Secondary | ICD-10-CM

## 2015-12-27 DIAGNOSIS — Z1212 Encounter for screening for malignant neoplasm of rectum: Secondary | ICD-10-CM

## 2015-12-27 LAB — MAGNESIUM: MAGNESIUM: 1.8 mg/dL (ref 1.5–2.5)

## 2015-12-27 LAB — CBC WITH DIFFERENTIAL/PLATELET
BASOS PCT: 0 % (ref 0–1)
Basophils Absolute: 0 10*3/uL (ref 0.0–0.1)
Eosinophils Absolute: 0 10*3/uL (ref 0.0–0.7)
Eosinophils Relative: 0 % (ref 0–5)
HEMATOCRIT: 46.7 % (ref 39.0–52.0)
HEMOGLOBIN: 16.1 g/dL (ref 13.0–17.0)
Lymphocytes Relative: 40 % (ref 12–46)
Lymphs Abs: 2.3 10*3/uL (ref 0.7–4.0)
MCH: 32.1 pg (ref 26.0–34.0)
MCHC: 34.5 g/dL (ref 30.0–36.0)
MCV: 93.2 fL (ref 78.0–100.0)
MONO ABS: 0.4 10*3/uL (ref 0.1–1.0)
MONOS PCT: 7 % (ref 3–12)
MPV: 10.4 fL (ref 8.6–12.4)
NEUTROS ABS: 3 10*3/uL (ref 1.7–7.7)
Neutrophils Relative %: 53 % (ref 43–77)
Platelets: 186 10*3/uL (ref 150–400)
RBC: 5.01 MIL/uL (ref 4.22–5.81)
RDW: 12.9 % (ref 11.5–15.5)
WBC: 5.7 10*3/uL (ref 4.0–10.5)

## 2015-12-27 LAB — TSH: TSH: 3.39 m[IU]/L (ref 0.40–4.50)

## 2015-12-27 LAB — BASIC METABOLIC PANEL WITH GFR
BUN: 16 mg/dL (ref 7–25)
CALCIUM: 8.8 mg/dL (ref 8.6–10.3)
CO2: 26 mmol/L (ref 20–31)
CREATININE: 0.91 mg/dL (ref 0.70–1.25)
Chloride: 103 mmol/L (ref 98–110)
GFR, Est African American: >89 mL/min (ref >=60)
GFR, Est Non African American: >89 mL/min (ref >=60)
GLUCOSE: 162 mg/dL — AB (ref 65–99)
Potassium: 3.7 mmol/L (ref 3.5–5.3)
SODIUM: 139 mmol/L (ref 135–146)

## 2015-12-27 LAB — HEPATIC FUNCTION PANEL
ALBUMIN: 4 g/dL (ref 3.6–5.1)
ALT: 29 U/L (ref 9–46)
AST: 18 U/L (ref 10–35)
Alkaline Phosphatase: 86 U/L (ref 40–115)
BILIRUBIN TOTAL: 0.4 mg/dL (ref 0.2–1.2)
Bilirubin, Direct: 0.1 mg/dL (ref <=0.2)
Indirect Bilirubin: 0.3 mg/dL (ref 0.2–1.2)
Total Protein: 6.9 g/dL (ref 6.1–8.1)

## 2015-12-27 LAB — LIPID PANEL
Cholesterol: 159 mg/dL (ref 125–200)
HDL: 32 mg/dL — AB (ref >=40)
LDL Cholesterol: 68 mg/dL (ref <130)
Total CHOL/HDL Ratio: 5 Ratio (ref <=5.0)
Triglycerides: 293 mg/dL — ABNORMAL HIGH (ref <150)
VLDL: 59 mg/dL — ABNORMAL HIGH (ref <30)

## 2015-12-27 LAB — IRON AND TIBC
%SAT: 26 % (ref 15–60)
Iron: 82 ug/dL (ref 50–180)
TIBC: 314 ug/dL (ref 250–425)
UIBC: 232 ug/dL (ref 125–400)

## 2015-12-27 LAB — VITAMIN B12: Vitamin B-12: 537 pg/mL (ref 200–1100)

## 2015-12-27 MED ORDER — HYDROCODONE-ACETAMINOPHEN 5-325 MG PO TABS: ORAL_TABLET | ORAL | Status: DC

## 2015-12-27 MED ORDER — BUPROPION HCL ER (XL) 300 MG PO TB24: 300.0000 mg | ORAL_TABLET | ORAL | Status: DC

## 2015-12-27 MED ORDER — LISINOPRIL-HYDROCHLOROTHIAZIDE 20-12.5 MG PO TABS: 1.0000 | ORAL_TABLET | Freq: Every day | ORAL | Status: DC

## 2015-12-27 NOTE — Patient Instructions (Signed)

## 2015-12-27 NOTE — Progress Notes (Signed)
Patient ID: George Montgomery, male   DOB: 1955-12-15, 60 y.o.   MRN: [id number]  Annual  Screening/Preventative Visit And Comprehensive Evaluation & Examination  This very nice 60 y.o. MWM presents for a Wellness/Preventative Visit & comprehensive evaluation and management of multiple medical co-morbidities.  Patient has been followed for HTN, Prediabetes, Hyperlipidemia and Vitamin D Deficiency.   HTN predates since Dec 2015. Patient's BP has been controlled at home.Today's BP is 160/84 and rechecked at 144/82.. Patient denies any cardiac symptoms as chest pain, palpitations, shortness of breath, dizziness or ankle swelling.   Patient's hyperlipidemia is controlled with diet and medications. Patient denies myalgias or other medication SE's. Last lipids were 12/27/2015: Cholesterol 159; HDL 32*; LDL Cholesterol 68; Triglycerides 293 on    Patient has prediabetes/insulin resistance  since Jan 2016 with A1c 5.6% and elevated Insulin 29  and patient denies reactive hypoglycemic symptoms, visual blurring, diabetic polys or paresthesias. Current A1c is now at 5.7% in the prediabetic range.    Patient has hx/o Low T and has been on replacement Therapy since 2016 with improved Stamina. Finally, patient has history of Vitamin D Deficiency of "62" in Jan 2016. and current  vitamin D is up to 56.     Medication Sig  . albuterol   HFA inhaler Inhale 1 puff into the lungs every 6 (six) hours as needed for wheezing or shortness of breath. prn  . ANDROGEL PUMP (1.62%) GEL APPLY 4 PUMPS DAILY AS DIRECTED  . azithromycin250 MG tablet Take 2 tablets (500 mg) on  Day 1,  followed by 1 tablet (250 mg) once daily on Days 2 through 5.  . EPINEPHrineinjection Inject 0.3 mg into the muscle once. prn  . fenofibrate  134 MG capsule Take 1 capsule daily before breakfast for blood fats  . NORCO 5-325 MG Take 1/2 to 1 tablet every 3 to 4 hours if needed for cough or pain (Patient not taking: Reported on 10/24/2015)  .  ibuprofen600 MG tablet Take 600 mg by mouth every 6 (six) hours as needed. Reported on 10/24/2015  . NASONEX nasal spray Place 2 sprays into the nose daily.  . montelukast (SINGULAIR) 10 MG tablet TAKE 1 TABLET BY MOUTH AT BEDTIME.  Marland Kitchen OVER THE COUNTER MEDICATION Reported on 10/24/2015  . predniSONE  20 MG tablet 1 tab 3 x day for 3 days, then 1 tab 2 x day for 3 days, then 1 tab 1 x day for 5 days  . PROMETHAZINE-DM  syrup Take 1 to 2 tsp enery 4 hours if needed for cough  . ranitidine (ZANTAC) 300 MG tablet Take 1 to 2 tablets daily for heartburn & reflux to allow wean and transition from PPI / pantoprazole  . sertraline (ZOLOFT) 50 MG tablet TAKE 1 TABLET (50 MG TOTAL) BY MOUTH DAILY.  Marland Kitchen sertraline (ZOLOFT) 50 MG tablet TAKE 1 TABLET (50 MG TOTAL) BY MOUTH DAILY.  Marland Kitchen terbinafine (LAMISIL) 250 MG tablet Take 1 tablet (250 mg total) by mouth daily.  . traZODone  150 MG tablet TAKE 1/3 TO 1/2 TO 1 TABLET 1 HOUR BEFORE SLEEP  . verapamil-SR 120 MG CR TAKE 1 TABLET DAILY WITH FOOD FOR BP & HEADACHE PREVENTION  . Vitamin D 50,000 units CAPS Take 50,000 Units by mouth every 7 (seven) days.   No Known Allergies   Past Medical History  Diagnosis Date  . Reflux   . Environmental allergies   . OSA on CPAP 08/01/2014  . Arthritis  Health Maintenance  Topic Date Due  . Hepatitis C Screening  May 13, 1956  . HIV Screening  01/18/1970  . TETANUS/TDAP  01/18/1974  . ZOSTAVAX  01/19/2015  . INFLUENZA VACCINE  05/19/2016  . COLONOSCOPY  02/12/2025   Immunization History  Administered Date(s) Administered  . Influenza Split 08/06/2015   Past Surgical History  Procedure Laterality Date  . Hemorrhoid surgery  09/2012  . Appendectomy  07/2012   Family History  Problem Relation Age of Onset  . Heart disease Mother   . Cancer - Colon Other   . Cancer - Prostate Other   . Stroke Maternal Aunt   . Cancer - Colon Paternal Uncle   . Cancer - Prostate Paternal Uncle   . Macular degeneration Mother      Social History   Social History  . Marital Status: Married    Spouse Name: [name]  . Number of Children: 4  . Years of Education: college   Occupational History  .      [employer]   Social History Main Topics  . Smoking status: Former Smoker    Quit date: 12/03/2012  . Smokeless tobacco: Never Used  . Alcohol Use: 1.2 oz/week    2 Standard drinks or equivalent per week     Comment: occ  . Drug Use: No  . Sexual Activity: Not on file   Other Topics Concern  . Not on file   Social History Narrative   Right handed, caffeine 3-4 cups daily, Married, 4 kids, 9 g kids., FT insurance agent (self employed). 4 yrs college    ROS Constitutional: Denies fever, chills, weight loss/gain, headaches, insomnia,  night sweats or change in appetite. Does c/o fatigue. Eyes: Denies redness, blurred vision, diplopia, discharge, itchy or watery eyes.  ENT: Denies discharge, congestion, post nasal drip, epistaxis, sore throat, earache, hearing loss, dental pain, Tinnitus, Vertigo, Sinus pain or snoring.  Cardio: Denies chest pain, palpitations, irregular heartbeat, syncope, dyspnea, diaphoresis, orthopnea, PND, claudication or edema Respiratory: denies cough, dyspnea, DOE, pleurisy, hoarseness, laryngitis or wheezing.  Gastrointestinal: Denies dysphagia, heartburn, reflux, water brash, pain, cramps, nausea, vomiting, bloating, diarrhea, constipation, hematemesis, melena, hematochezia, jaundice or hemorrhoids Genitourinary: Denies dysuria, frequency, urgency, nocturia, hesitancy, discharge, hematuria or flank pain Musculoskeletal: Denies arthralgia, myalgia, stiffness, Jt. Swelling, pain, limp or strain/sprain. Denies Falls. Skin: Denies puritis, rash, hives, warts, acne, eczema or change in skin lesion Neuro: No weakness, tremor, incoordination, spasms, paresthesia or pain Psychiatric: Denies confusion, memory loss or sensory loss. Denies Depression. Endocrine: Denies change in  weight, skin, hair change, nocturia, and paresthesia, diabetic polys, visual blurring or hyper / hypo glycemic episodes.  Heme/Lymph: No excessive bleeding, bruising or enlarged lymph nodes.  Physical Exam  BP 160/84 re-ck'd at 144/82    Pulse 72  Temp 97.7 F   Resp 16  Ht 5' 9.5"   Wt 250 lb 12.8 oz     BMI 36.52   General Appearance: Well nourished, in no apparent distress. Eyes: PERRLA, EOMs, conjunctiva no swelling or erythema, normal fundi and vessels. Sinuses: No frontal/maxillary tenderness ENT/Mouth: EACs patent / TMs  nl. Nares clear without erythema, swelling, mucoid exudates. Oral hygiene is good. No erythema, swelling, or exudate. Tongue normal, non-obstructing. Tonsils not swollen or erythematous. Hearing normal.  Neck: Supple, thyroid normal. No bruits, nodes or JVD. Respiratory: Respiratory effort normal.  BS equal and clear bilateral without rales, rhonci, wheezing or stridor. Cardio: Heart sounds are normal with regular rate and rhythm and no  murmurs, rubs or gallops. Peripheral pulses are normal and equal bilaterally without edema. No aortic or femoral bruits. Chest: symmetric with normal excursions and percussion.  Abdomen: Rotund & Soft with Nl bowel sounds. Nontender, no guarding, rebound, hernias, masses, or organomegaly.  Lymphatics: Non tender without lymphadenopathy.  Genitourinary: No hernias.Testes nl. DRE - prostate nl for age - smooth & firm w/o nodules. Musculoskeletal: Full ROM all peripheral extremities, joint stability, 5/5 strength, and normal gait. Skin: Warm and dry without rashes, lesions, cyanosis, clubbing or  ecchymosis.  Neuro: Cranial nerves intact, reflexes equal bilaterally. Normal muscle tone, no cerebellar symptoms. Sensation intact.  Pysch: Alert and oriented X 3 with normal affect, insight and judgment appropriate.   Assessment and Plan  1. Annual Preventative/Screening Exam   - Microalbumin / creatinine urine ratio - EKG 12-Lead -  [place], RETROPERITNL ABD,  LTD - POC Hemoccult Bld/Stl - Urinalysis, Routine w reflex microscopic  - Vitamin B12 - Iron and TIBC - PSA - Testosterone - CBC with Differential/Platelet - BASIC METABOLIC PANEL WITH GFR - Hepatic function panel - Magnesium - Lipid panel - TSH - Hemoglobin A1c - Insulin, random - VITAMIN D 25 Hydroxy   2. Essential hypertension  - Microalbumin / creatinine urine ratio - EKG 12-Lead - [place], RETROPERITNL ABD,  LTD - TSH - lisinopril-hydrochlorothiazide (ZESTORETIC) 20-12.5 MG tablet; Take 1 tablet by mouth daily.  Dispense: 90 tablet; Refill: 1  3. Mixed hyperlipidemia  - Lipid panel - TSH  4. Prediabetes  - Hemoglobin A1c - Insulin, random  5. Vitamin D deficiency  - VITAMIN D 25 Hydroxy   6. Testosterone deficiency  - Testosterone  7. Morbid obesity,(HCC)   8. OSA treated with BiPAP   9. Gastroesophageal reflux disease   10. Other fatigue  - Vitamin B12 - Iron and TIBC - Testosterone - CBC with Differential/Platelet - TSH  11. Screening for rectal cancer  - POC Hemoccult Bld/Stl   12. Prostate cancer screening  - PSA  13. Depression, controlled  - buPROPion (WELLBUTRIN XL) 300 MG 24 hr tablet; Take 1 tablet (300 mg total) by mouth every morning.  Dispense: 90 tablet; Refill: 1  14. Acute bronchitis, unspecified organism  - HYDROcodone-acetaminophen (NORCO/VICODIN) 5-325 MG tablet; Take 1/2 to 1 tablet every 3 to 4 hours if needed for cough or pain  Dispense: 30 tablet; Refill: 0  15. Medication management  - Urinalysis, Routine w reflex microscopic  - CBC with Differential/Platelet - BASIC METABOLIC PANEL WITH GFR - Hepatic function panel - Magnesium   Continue prudent diet as discussed, weight control, BP monitoring, regular exercise, and medications as discussed.  Discussed med effects and SE's. Routine screening labs and tests as requested with regular follow-up as recommended. Over 40 minutes of exam,  counseling, chart review and high complex critical decision making was performed

## 2015-12-28 LAB — URINALYSIS, ROUTINE W REFLEX MICROSCOPIC
Bilirubin Urine: NEGATIVE
GLUCOSE, UA: NEGATIVE
Hgb urine dipstick: NEGATIVE
Ketones, ur: NEGATIVE
LEUKOCYTES UA: NEGATIVE
Nitrite: NEGATIVE
PH: 5.5 (ref 5.0–8.0)
PROTEIN: NEGATIVE
Specific Gravity, Urine: 1.024 (ref 1.001–1.035)

## 2015-12-28 LAB — TESTOSTERONE: TESTOSTERONE: 173 ng/dL — AB (ref 250–827)

## 2015-12-28 LAB — HEMOGLOBIN A1C
HEMOGLOBIN A1C: 5.7 % — AB (ref <5.7)
Mean Plasma Glucose: 117 mg/dL — ABNORMAL HIGH (ref <117)

## 2015-12-28 LAB — MICROALBUMIN / CREATININE URINE RATIO
Creatinine, Urine: 112 mg/dL (ref 20–370)
MICROALB UR: 0.5 mg/dL
MICROALB/CREAT RATIO: 4 ug/mg{creat} (ref <30)

## 2015-12-28 LAB — VITAMIN D 25 HYDROXY (VIT D DEFICIENCY, FRACTURES): VIT D 25 HYDROXY: 56 ng/mL (ref 30–100)

## 2015-12-28 LAB — PSA: PSA: 0.54 ng/mL (ref <=4.00)

## 2015-12-30 LAB — INSULIN, RANDOM: INSULIN: 382 u[IU]/mL — AB (ref 2.0–19.6)

## 2016-01-01 ENCOUNTER — Other Ambulatory Visit: Payer: Self-pay | Admitting: Internal Medicine

## 2016-01-01 DIAGNOSIS — R05 Cough: Secondary | ICD-10-CM

## 2016-01-01 DIAGNOSIS — R059 Cough, unspecified: Secondary | ICD-10-CM

## 2016-01-01 MED ORDER — BENZONATATE 200 MG PO CAPS: ORAL_CAPSULE | ORAL | Status: DC

## 2016-01-21 ENCOUNTER — Other Ambulatory Visit: Payer: Self-pay | Admitting: *Deleted

## 2016-01-21 MED ORDER — SILDENAFIL CITRATE 100 MG PO TABS: ORAL_TABLET | ORAL | Status: DC

## 2016-01-22 ENCOUNTER — Ambulatory Visit: Payer: BLUE CROSS/BLUE SHIELD | Admitting: *Deleted

## 2016-01-23 ENCOUNTER — Other Ambulatory Visit: Payer: Self-pay | Admitting: Internal Medicine

## 2016-01-27 ENCOUNTER — Other Ambulatory Visit: Payer: Self-pay | Admitting: Internal Medicine

## 2016-01-27 DIAGNOSIS — N5201 Erectile dysfunction due to arterial insufficiency: Secondary | ICD-10-CM

## 2016-01-27 MED ORDER — SILDENAFIL CITRATE 20 MG PO TABS: ORAL_TABLET | ORAL | Status: AC

## 2016-02-10 ENCOUNTER — Other Ambulatory Visit: Payer: Self-pay

## 2016-02-10 MED ORDER — MONTELUKAST SODIUM 10 MG PO TABS: 10.0000 mg | ORAL_TABLET | Freq: Every day | ORAL | Status: DC

## 2016-02-28 ENCOUNTER — Other Ambulatory Visit: Payer: Self-pay | Admitting: Internal Medicine

## 2016-03-04 ENCOUNTER — Other Ambulatory Visit: Payer: Self-pay | Admitting: *Deleted

## 2016-03-04 ENCOUNTER — Ambulatory Visit (INDEPENDENT_AMBULATORY_CARE_PROVIDER_SITE_OTHER): Payer: BLUE CROSS/BLUE SHIELD | Admitting: *Deleted

## 2016-03-04 DIAGNOSIS — I10 Essential (primary) hypertension: Secondary | ICD-10-CM | POA: Diagnosis not present

## 2016-03-04 MED ORDER — LOSARTAN POTASSIUM-HCTZ 100-12.5 MG PO TABS
1.0000 | ORAL_TABLET | Freq: Every day | ORAL | Status: DC
Start: 2016-03-04 — End: 2016-08-29

## 2016-03-04 NOTE — Progress Notes (Signed)
Patient ID: George Montgomery, male   DOB: 08/02/1956, 60 y.o.   MRN: [id number] Patient seen for a NV due to elevated BP at home of 168/75.  Patient states he changed his BP med from Verapamil(finished the RX) to his new RX of Lisinopril/HCTZ 20/12.5 mg about 3 days ago and has noticed a tingling feeling in his tongue.  Per Dr Melford Aase, this can be a side effect of the Lisinopril/HCTZ and advised patient to stop the Lisinopril/HCTZ and a new RX for Hyzaar 100/12.5 mg #30 x 5 refills sent to the patient's pharmacy.

## 2016-03-19 ENCOUNTER — Ambulatory Visit: Payer: Self-pay | Admitting: Internal Medicine

## 2016-03-19 ENCOUNTER — Encounter: Payer: Self-pay | Admitting: Internal Medicine

## 2016-03-19 ENCOUNTER — Ambulatory Visit (INDEPENDENT_AMBULATORY_CARE_PROVIDER_SITE_OTHER): Payer: BLUE CROSS/BLUE SHIELD | Admitting: Internal Medicine

## 2016-03-19 VITALS — BP 128/62 | HR 72 | Temp 97.5°F | Resp 16 | Ht 69.5 in | Wt 244.8 lb

## 2016-03-19 DIAGNOSIS — I10 Essential (primary) hypertension: Secondary | ICD-10-CM | POA: Diagnosis not present

## 2016-03-19 DIAGNOSIS — L918 Other hypertrophic disorders of the skin: Secondary | ICD-10-CM

## 2016-03-19 DIAGNOSIS — M7521 Bicipital tendinitis, right shoulder: Secondary | ICD-10-CM | POA: Diagnosis not present

## 2016-03-19 DIAGNOSIS — E669 Obesity, unspecified: Secondary | ICD-10-CM | POA: Diagnosis not present

## 2016-03-19 MED ORDER — DEXAMETHASONE SODIUM PHOSPHATE 100 MG/10ML IJ SOLN
10.0000 mg | Freq: Once | INTRAMUSCULAR | Status: AC
  Administered 2016-03-19: 10 mg via INTRAMUSCULAR

## 2016-03-19 MED ORDER — HYDROCODONE-ACETAMINOPHEN 5-325 MG PO TABS: ORAL_TABLET | ORAL | Status: DC

## 2016-03-19 MED ORDER — PHENTERMINE HCL 37.5 MG PO TABS: ORAL_TABLET | ORAL | Status: DC

## 2016-03-19 NOTE — Progress Notes (Signed)
Subjective:    Patient ID: George Montgomery, male    DOB: [birth year], 60 y.o.   MRN: [id number]  HPI  Patient returns for f/u of elevated BP and had tongue swelling on Lisinopril and was switched to Losartan & BP controlled w/o further SE's. Cardiovascular / Hypertensive ROS is negative. Patient also is c/o pain R shoulder limiting ROM & interrupting sleep. Lastly, he's c/o discomfort from irritated skin tags in both axillae. Patient also requested an appetite suppressant to "jump start" his anticipated dieting and weight loss.   Medication Sig  . albuterol HFA inhaler Inhale 1 puff into the lungs every 6 (six) hours as needed for wheezing or shortness of breath. prn  . ANDROGEL PUMP  (1.62%) GEL APPLY 4 PUMPS EVERY DAY AS DIRECTED  . VITAMIN D  Take 5,000 Units by mouth 3 (three) times daily.  Marland Kitchen EPINEPHrine 0.3 mg injection Inject 0.3 mg into the muscle once. prn  . fenofibrate  134 MG  TAKE ONE CAPSULE BY MOUTH ONCE DAILY BEFORE BREAKFAST FOR BLOOD FATS  . ibuprofen  600 MG  Take 600 mg by mouth every 6 (six) hours as needed. Reported on 10/24/2015  . losartan-hctz 100-12.5  Take 1 tablet by mouth daily.  . montelukast  10 MG  Take 1 tablet (10 mg total) by mouth at bedtime.  . ranitidine  300 MG  Take 1 to 2 tablets daily for heartburn & reflux to allow wean and transition from PPI / pantoprazole  . sertraline  50 MG  TAKE 1 TABLET (50 MG TOTAL) BY MOUTH DAILY.  . sildenafil 20 MG  Take 1 to 5 tabs daily as needed  . traZODone  150 MG  TAKE 1/3 TO 1/2 TO 1 TABLET 1 HOUR BEFORE SLEEP  . NORCO 5-325 MG Take 1/2 to 1 tablet every 3 to 4 hours if needed for cough or pain  . buPROPion  XL 300 MG  Take 1 tablet (300 mg total) by mouth every morning. (NOT TAKING)   Allergies  Allergen Reactions  . Lisinopril Other (See Comments)    Patient intolerant of drug which caused his tongue to tingle.   Past Medical History  Diagnosis Date  . Reflux   . Environmental allergies   . OSA on CPAP  08/01/2014  . Arthritis    Past Surgical History  Procedure Laterality Date  . Hemorrhoid surgery  09/2012  . Appendectomy  07/2012   Review of Systems  10 point systems review negative except as above.    Objective:   Physical Exam  BP 128/62   Pulse 72  Temp 97.5 F Resp 16  Ht 5' 9.5" Wt 244 lb 12.8 oz     BMI 35.64  HEENT - Eac's patent. TM's Nl. EOM's full. PERRLA. NasoOroPharynx clear. Neck - supple. Nl Thyroid. Carotids 2+ & No bruits, nodes, JVD Chest - Clear equal BS w/o Rales, rhonchi, wheezes. Cor - Nl HS. RRR w/o sig MGR. PP 1(+). No edema. Abd - No palpable organomegaly, masses or tenderness. BS nl. MS- FROM w/o deformities. Muscle power, tone and bulk Nl. Gait Nl.  Decreased abduction & int/external rotation of R shoulder due to pain  With trigger point exquisite tenderness at R shoulder biceps insertion.    Procedure [id number]) After informed consent and aseptic prep with alcohol 1cc (10 mg) dexamethasone admixed with 1 cc Lidocaine 1% was infiltrated into the proximal biceps insertion/trigger point  of the R shoulder & sterile bandage was applied.  Neuro - No obvious Cr N abnormalities. Sensory, motor and Cerebellar functions appear Nl w/o focal abnormalities. Skin - Multiple irritated skin tags ranging from 4- 8 mm of the bilateral axillae are noted.    Procedure (CPT: U6307432) After informed consent and aseptic prep with alcohol # 6 tags of the R axilla and # 4 tags of the L axilla were infiltrated with 1% lidocaine. Then the tags were excised by cutting hyfrecation to provide hemostasis of the excision sites.  Patient was instructed in po wound care.     Assessment & Plan:   1. Essential hypertension  - continue meds same and continue BP monitoring.   2. Bicipital tendonitis of right shoulder  - HYDROcodone-acetaminophen (NORCO/VICODIN) 5-325 MG tablet; Take 1/2 to 1 tablet every 3 to 4 hours if needed for  pain  Dispense: 50 tablet; Refill: 0 -  dexamethasone (DECADRON) injection 10 mg; Inject 1 mL (10 mg total) into the r shoulder trigger point.  3. Cutaneous skin tags  - excised #10 tags by cutting electrocaudery  4. Obesity  - Long discussion re: dieting and encouraged to follow Dr Nicky Pugh books on "End of Dieting" & "End of Diabetes".   - phentermine (ADIPEX-P) 37.5 MG tablet; Take 1/2 to 1 tablet every morning for dieting & weightloss  Dispense: 30 tablet; Refill: 5  - discussed effects/SE's & potential for habituation.

## 2016-04-13 ENCOUNTER — Other Ambulatory Visit: Payer: Self-pay | Admitting: Internal Medicine

## 2016-04-13 DIAGNOSIS — E349 Endocrine disorder, unspecified: Secondary | ICD-10-CM

## 2016-04-24 ENCOUNTER — Ambulatory Visit: Payer: Self-pay | Admitting: Internal Medicine

## 2016-04-29 ENCOUNTER — Other Ambulatory Visit: Payer: Self-pay | Admitting: Internal Medicine

## 2016-05-07 ENCOUNTER — Ambulatory Visit (INDEPENDENT_AMBULATORY_CARE_PROVIDER_SITE_OTHER): Payer: BLUE CROSS/BLUE SHIELD | Admitting: Internal Medicine

## 2016-05-07 ENCOUNTER — Encounter: Payer: Self-pay | Admitting: Internal Medicine

## 2016-05-07 VITALS — BP 150/82 | HR 80 | Temp 98.0°F | Resp 18 | Ht 69.5 in | Wt 238.0 lb

## 2016-05-07 DIAGNOSIS — R300 Dysuria: Secondary | ICD-10-CM

## 2016-05-07 NOTE — Patient Instructions (Signed)

## 2016-05-07 NOTE — Progress Notes (Signed)
  Subjective:    Patient ID: George Montgomery, male    DOB: 12-16-1955, 60 y.o.   MRN: [id number]  HPI  This nice 60 yo MWM with HTN, HLD MO & PreDM presents for f/u with 7 # weght loss over the last 6 weeks on Phentermine, and repoorts increased energy and also that he's exercising regularly. Today he's also c/o dysuria and denies discharge or change in color of urine.   Medication Sig  . albuterol (PROVENTIL HFA;VENTOLIN HFA) 108 (90 BASE) MCG/ACT inhaler Inhale 1 puff into the lungs every 6 (six) hours as needed for wheezing or shortness of breath. prn  . ANDROGEL PUMP 20.25 MG/ACT (1.62%) GEL APPLY 4 PUMPS EVERY DAY AS DIRECTED  . Cholecalciferol (VITAMIN D PO) Take 5,000 Units by mouth 3 (three) times daily.  Marland Kitchen EPINEPHrine 0.3 mg/0.3 mL IJ SOAJ injection Inject 0.3 mg into the muscle once. prn  . fenofibrate micronized (LOFIBRA) 134 MG capsule TAKE ONE CAPSULE BY MOUTH ONCE DAILY BEFORE BREAKFAST FOR BLOOD FATS  . HYDROcodone-acetaminophen (NORCO/VICODIN) 5-325 MG tablet Take 1/2 to 1 tablet every 3 to 4 hours if needed for cough or pain  . ibuprofen (ADVIL,MOTRIN) 600 MG tablet Take 600 mg by mouth every 6 (six) hours as needed. Reported on 10/24/2015  . losartan-hydrochlorothiazide (HYZAAR) 100-12.5 MG tablet Take 1 tablet by mouth daily.  . montelukast (SINGULAIR) 10 MG tablet Take 1 tablet (10 mg total) by mouth at bedtime.  Marland Kitchen OVER THE COUNTER MEDICATION Reported on 10/24/2015  . phentermine (ADIPEX-P) 37.5 MG tablet Take 1/2 to 1 tablet every morning for dieting & weightloss  . ranitidine (ZANTAC) 300 MG tablet Take 1 to 2 tablets daily for heartburn & reflux to allow wean and transition from PPI / pantoprazole  . sertraline (ZOLOFT) 50 MG tablet TAKE 1 TABLET (50 MG TOTAL) BY MOUTH DAILY.  . sildenafil (REVATIO) 20 MG tablet Take 1 to 5 tabs daily as needed  . traZODone (DESYREL) 150 MG tablet TAKE 1/3 TO 1/2 TO 1 TABLET 1 HOUR BEFORE SLEEP   Allergies  Allergen Reactions  . Lisinopril  Other (See Comments)    Patient intolerant of drug which caused his tongue to tingle.   Past Medical History  Diagnosis Date  . Reflux   . Environmental allergies   . OSA on CPAP 08/01/2014  . Arthritis    Past Surgical History  Procedure Laterality Date  . Hemorrhoid surgery  09/2012  . Appendectomy  07/2012   Review of Systems 10 point systems review negative except as above.    Objective:   Physical Exam  BP 150/82  Pulse 80  Temp 98 F   Resp 18  Ht 5' 9.5"   Wt 238 lb     BMI 34.65   HEENT - Eac's patent. TM's Nl. EOM's full. PERRLA. NasoOroPharynx clear. Neck - supple. Nl Thyroid. Carotids 2+ & No bruits, nodes, JVD Chest - Clear equal BS w/o Rales, rhonchi, wheezes. Cor - Nl HS. RRR w/o sig MGR. PP 1(+). No edema. MS- FROM w/o deformities. Muscle power, tone and bulk Nl. Gait Nl. Neuro - No obvious Cr N abnormalities. Sensory, motor and Cerebellar functions appear Nl w/o focal abnormalities.    Assessment & Plan:   1. Dysuria  - Urinalysis, Routine w reflex microscopic  - Urine culture

## 2016-05-08 LAB — URINALYSIS, ROUTINE W REFLEX MICROSCOPIC
BILIRUBIN URINE: NEGATIVE
Glucose, UA: NEGATIVE
HGB URINE DIPSTICK: NEGATIVE
KETONES UR: NEGATIVE
Leukocytes, UA: NEGATIVE
Nitrite: NEGATIVE
PROTEIN: NEGATIVE
Specific Gravity, Urine: 1.009 (ref 1.001–1.035)
pH: 7 (ref 5.0–8.0)

## 2016-05-09 LAB — URINE CULTURE: ORGANISM ID, BACTERIA: NO GROWTH

## 2016-05-28 ENCOUNTER — Encounter: Payer: Self-pay | Admitting: Internal Medicine

## 2016-05-28 ENCOUNTER — Other Ambulatory Visit: Payer: Self-pay | Admitting: Internal Medicine

## 2016-05-28 DIAGNOSIS — E669 Obesity, unspecified: Secondary | ICD-10-CM

## 2016-05-28 MED ORDER — PHENTERMINE HCL 37.5 MG PO TABS: ORAL_TABLET | ORAL | 1 refills | Status: DC

## 2016-06-18 ENCOUNTER — Emergency Department (HOSPITAL_COMMUNITY)
Admission: EM | Admit: 2016-06-18 | Discharge: 2016-06-18 | Disposition: A | Discharge status: Home / Self Care | Payer: BLUE CROSS/BLUE SHIELD | Attending: Emergency Medicine | Admitting: Emergency Medicine

## 2016-06-18 ENCOUNTER — Encounter (HOSPITAL_COMMUNITY): Payer: Self-pay | Admitting: Emergency Medicine

## 2016-06-18 ENCOUNTER — Ambulatory Visit (HOSPITAL_COMMUNITY)
Admission: EM | Admit: 2016-06-18 | Discharge: 2016-06-18 | Disposition: A | Discharge status: Nursing Home (Includes ICF) | Payer: BLUE CROSS/BLUE SHIELD | Attending: Family Medicine | Admitting: Family Medicine

## 2016-06-18 DIAGNOSIS — Z79899 Other long term (current) drug therapy: Secondary | ICD-10-CM | POA: Insufficient documentation

## 2016-06-18 DIAGNOSIS — T7840XA Allergy, unspecified, initial encounter: Secondary | ICD-10-CM | POA: Diagnosis not present

## 2016-06-18 DIAGNOSIS — I10 Essential (primary) hypertension: Secondary | ICD-10-CM | POA: Insufficient documentation

## 2016-06-18 DIAGNOSIS — T782XXA Anaphylactic shock, unspecified, initial encounter: Secondary | ICD-10-CM

## 2016-06-18 DIAGNOSIS — L509 Urticaria, unspecified: Secondary | ICD-10-CM

## 2016-06-18 DIAGNOSIS — Z87891 Personal history of nicotine dependence: Secondary | ICD-10-CM | POA: Insufficient documentation

## 2016-06-18 DIAGNOSIS — T63441A Toxic effect of venom of bees, accidental (unintentional), initial encounter: Secondary | ICD-10-CM | POA: Insufficient documentation

## 2016-06-18 MED ORDER — EPINEPHRINE HCL 1 MG/ML IJ SOLN
INTRAMUSCULAR | Status: AC
  Filled 2016-06-18: qty 1

## 2016-06-18 MED ORDER — ONDANSETRON HCL 4 MG/2ML IJ SOLN
4.0000 mg | INTRAMUSCULAR | Status: AC
  Administered 2016-06-18: 4 mg via INTRAVENOUS
  Filled 2016-06-18: qty 2

## 2016-06-18 MED ORDER — EPINEPHRINE 0.3 MG/0.3ML IJ SOAJ: 0.3000 mg | Freq: Once | INTRAMUSCULAR | 0 refills | Status: AC

## 2016-06-18 MED ORDER — FAMOTIDINE 40 MG PO TABS: 40.0000 mg | ORAL_TABLET | Freq: Every day | ORAL | 0 refills | Status: DC

## 2016-06-18 MED ORDER — EPINEPHRINE HCL 1 MG/ML IJ SOLN
1.0000 mg | Freq: Once | INTRAMUSCULAR | Status: DC
  Administered 2016-06-18: 1 mg via INTRAMUSCULAR

## 2016-06-18 MED ORDER — PREDNISONE 10 MG PO TABS: 40.0000 mg | ORAL_TABLET | Freq: Every day | ORAL | 0 refills | Status: AC

## 2016-06-18 MED ORDER — METHYLPREDNISOLONE SODIUM SUCC 125 MG IJ SOLR: 125.0000 mg | Freq: Once | INTRAMUSCULAR | Status: DC

## 2016-06-18 MED ORDER — METHYLPREDNISOLONE SODIUM SUCC 125 MG IJ SOLR
125.0000 mg | Freq: Once | INTRAMUSCULAR | Status: DC
  Administered 2016-06-18: 125 mg via INTRAVENOUS

## 2016-06-18 MED ORDER — SODIUM CHLORIDE 0.9 % IV SOLN
Freq: Once | INTRAVENOUS | Status: DC
  Administered 2016-06-18: 14:00:00 via INTRAVENOUS

## 2016-06-18 MED ORDER — METHYLPREDNISOLONE SODIUM SUCC 125 MG IJ SOLR
INTRAMUSCULAR | Status: AC
  Filled 2016-06-18: qty 2

## 2016-06-18 MED ORDER — DIPHENHYDRAMINE HCL 50 MG/ML IJ SOLN: 50.0000 mg | INTRAMUSCULAR | Status: DC

## 2016-06-18 MED ORDER — LORAZEPAM 2 MG/ML IJ SOLN
INTRAMUSCULAR | Status: AC
  Filled 2016-06-18: qty 1

## 2016-06-18 MED ORDER — FAMOTIDINE IN NACL 20-0.9 MG/50ML-% IV SOLN
20.0000 mg | Freq: Once | INTRAVENOUS | Status: AC
  Administered 2016-06-18: 20 mg via INTRAVENOUS
  Filled 2016-06-18: qty 50

## 2016-06-18 MED ORDER — DIPHENHYDRAMINE HCL 50 MG/ML IJ SOLN
50.0000 mg | INTRAMUSCULAR | Status: DC
  Administered 2016-06-18: 50 mg via INTRAVENOUS

## 2016-06-18 MED ORDER — DIPHENHYDRAMINE HCL 50 MG/ML IJ SOLN
INTRAMUSCULAR | Status: AC
  Filled 2016-06-18: qty 1

## 2016-06-18 MED ORDER — DIPHENHYDRAMINE HCL 25 MG PO CAPS: 25.0000 mg | ORAL_CAPSULE | Freq: Three times a day (TID) | ORAL | 0 refills | Status: DC | PRN

## 2016-06-18 MED ORDER — EPINEPHRINE HCL 1 MG/ML IJ SOLN
0.3000 mg | Freq: Once | INTRAMUSCULAR | Status: DC
  Administered 2016-06-18: 0.3 mg via INTRAMUSCULAR

## 2016-06-18 NOTE — ED Notes (Addendum)
Pt given 1mg  epi IM reported by Stevan Born, FNP.  Pt has been put on a cardiac monitor with 18G IV access in his LAC.  Report given to [facility] and the Charge ED RN, [place].  Pt is stable with O2 and NS. Pt has complaints of needing to belch.

## 2016-06-18 NOTE — ED Triage Notes (Addendum)
Pt was possibly stung by a bee about an hour ago on his right calf.  Pt is allergic to bees and has an epi-pen that expired several years ago.  Pt is here today with flushing of the face, numbness of the lips, feeling of his throat constricting and hives all over his body.  Pt was given .3mg  Epi [place] by Stevan Born, FNP, 50mg  benadryl IM by me, and 125mg  Solu-Medrol IM by Stevan Born, NP.  Pt is sitting comfortably in room 9 awaiting transfer to ED.  Pt will stay here for a few minutes to be monitored and if his vital signs are stable we will transfer him via shuttle to the ED.  Report was called to First RN in the ED, Marya Amsler.

## 2016-06-18 NOTE — ED Notes (Signed)
[facility] arrived at 1405.  Report given and pt care turned over to [facility] for transport to ED.

## 2016-06-18 NOTE — ED Provider Notes (Signed)
  Physical Exam  BP 119/69   Pulse 102   Temp 98.4 F (36.9 C) (Oral)   Resp 18   Ht 5\' 9"  (1.753 m)   Wt 102.5 kg   SpO2 97%   BMI 33.37 kg/m   Physical Exam Gen: awake, alert, NAD HEENT: no facial swelling  CV: normal rate, rhythm Pulm: no wheezing, normal work of breathing Skin: no urticaria evident   ED Course  Procedures  MDM  Care assumed from Waynetta Pean PAC around 16:00. Please see his note for full history, exam, and assessment. Briefly, George Montgomery is a 60 year old male who is transferred here from urgent care after receiving 2 doses of intramuscular epinephrine for anaphylaxis following a bee sting that occurred at 12:30 PM today. Has a prior history of anaphylaxis and reports he has an EpiPen at home. Plan to observe for rebound reaction, given additional antihistamines and steroids at discharge if he remains stable during period of observation here.   On reevaluation, patient remained stable with normal vital signs and no return of symptoms. Discussed return precautions. Given prescriptions for EpiPen, counseled on use, and given prescription for steroids for the next 4 days. We'll also use antihistamines for the next 2 days. Discharged in stable condition.  Case discussed with Dr. Vanita Panda, who oversaw management of this patient.    George Booty, MD 06/18/16 [id number]    George Muskrat, MD 06/18/16 [phone]

## 2016-06-18 NOTE — ED Provider Notes (Signed)
CSN: [id number]     Arrival date & time 06/18/16  1308 History   None    Chief Complaint  Patient presents with  . Insect Bite  . Allergic Reaction   (Consider location/radiation/quality/duration/timing/severity/associated sxs/prior Treatment) Patient is stung by bee and RN Rosanna Randy asks me to see patient immediately.    Rash  Location:  Leg and torso (Buttocks and back of legs) Torso rash location:  L breast, R breast, L flank, R flank, abd LLQ, abd LUQ, abd RUQ and abd RLQ Leg rash location:  L leg and R leg Quality: itchiness and redness   Onset quality:  Sudden Duration:  1 hour Timing:  Constant Progression:  Worsening Chronicity:  New Context: insect bite/sting   Relieved by:  Nothing Worsened by:  Nothing Ineffective treatments:  Antihistamines Associated symptoms: fatigue, shortness of breath, throat swelling and tongue swelling     Past Medical History:  Diagnosis Date  . Arthritis   . Environmental allergies   . OSA on CPAP 08/01/2014  . Reflux    Past Surgical History:  Procedure Laterality Date  . APPENDECTOMY  07/2012  . HEMORRHOID SURGERY  09/2012   Family History  Problem Relation Age of Onset  . Heart disease Mother   . Cancer - Colon Other   . Cancer - Prostate Other   . Stroke Maternal Aunt   . Cancer - Colon Paternal Uncle   . Cancer - Prostate Paternal Uncle   . Macular degeneration Mother    Social History  Substance Use Topics  . Smoking status: Former Smoker    Quit date: 12/03/2012  . Smokeless tobacco: Never Used  . Alcohol use 1.2 oz/week    2 Standard drinks or equivalent per week     Comment: occ    Review of Systems  Constitutional: Positive for fatigue.  HENT: Negative.   Eyes: Negative.   Respiratory: Positive for shortness of breath.   Gastrointestinal: Negative.   Endocrine: Negative.   Genitourinary: Negative.   Musculoskeletal: Negative.   Skin: Positive for rash.  Allergic/Immunologic: Negative.   Neurological:  Negative.   Hematological: Negative.     Allergies  Lisinopril  Home Medications   Prior to Admission medications   Medication Sig Start Date End Date Taking? Authorizing Provider  albuterol (PROVENTIL HFA;VENTOLIN HFA) 108 (90 BASE) MCG/ACT inhaler Inhale 1 puff into the lungs every 6 (six) hours as needed for wheezing or shortness of breath. prn 02/28/15   Unk Pinto, MD  ANDROGEL PUMP 20.25 MG/ACT (1.62%) GEL APPLY 4 PUMPS EVERY DAY AS DIRECTED 04/13/16   Unk Pinto, MD  buPROPion (WELLBUTRIN XL) 300 MG 24 hr tablet  03/20/16   Historical Provider, MD  Cholecalciferol (VITAMIN D PO) Take 5,000 Units by mouth 3 (three) times daily.    Historical Provider, MD  EPINEPHrine 0.3 mg/0.3 mL IJ SOAJ injection Inject 0.3 mg into the muscle once. prn    Historical Provider, MD  fenofibrate micronized (LOFIBRA) 134 MG capsule TAKE ONE CAPSULE BY MOUTH ONCE DAILY BEFORE BREAKFAST FOR BLOOD FATS 04/30/16   Unk Pinto, MD  HYDROcodone-acetaminophen (NORCO/VICODIN) 5-325 MG tablet Take 1/2 to 1 tablet every 3 to 4 hours if needed for cough or pain 03/19/16   Unk Pinto, MD  ibuprofen (ADVIL,MOTRIN) 600 MG tablet Take 600 mg by mouth every 6 (six) hours as needed. Reported on 10/24/2015    Historical Provider, MD  losartan-hydrochlorothiazide (HYZAAR) 100-12.5 MG tablet Take 1 tablet by mouth daily. 03/04/16   Gwyndolyn Saxon  Melford Aase, MD  montelukast (SINGULAIR) 10 MG tablet Take 1 tablet (10 mg total) by mouth at bedtime. 02/10/16   Vicie Mutters, PA-C  OVER THE COUNTER MEDICATION Reported on 10/24/2015    Historical Provider, MD  phentermine (ADIPEX-P) 37.5 MG tablet Take 1 tablet every morning  And 1/2 tablet at 2 pm to for dieting & weight loss 05/28/16 10/28/16  Unk Pinto, MD  ranitidine (ZANTAC) 300 MG tablet Take 1 to 2 tablets daily for heartburn & reflux to allow wean and transition from PPI / pantoprazole 08/06/15   Unk Pinto, MD  sertraline (ZOLOFT) 50 MG tablet TAKE 1 TABLET (50 MG  TOTAL) BY MOUTH DAILY. 08/22/15   Unk Pinto, MD  sildenafil (REVATIO) 20 MG tablet Take 1 to 5 tabs daily as needed 01/27/16 01/26/17  Unk Pinto, MD  traZODone (DESYREL) 150 MG tablet TAKE 1/3 TO 1/2 TO 1 TABLET 1 HOUR BEFORE SLEEP 05/03/15   Historical Provider, MD   Meds Ordered and Administered this Visit  Medications - No data to display  BP 154/76 (BP Location: Left Arm)   Pulse 112   Temp 98.9 F (37.2 C) (Oral)   Resp 18   SpO2 96%  No data found.   Physical Exam  Constitutional: He is oriented to person, place, and time. He appears well-developed and well-nourished.  HENT:  Head: Normocephalic and atraumatic.  Eyes: EOM are normal. Pupils are equal, round, and reactive to light.  Neck: Normal range of motion. Neck supple.  Cardiovascular: Normal rate, regular rhythm and normal heart sounds.   Pulmonary/Chest: Effort normal and breath sounds normal.  Abdominal: Soft. Bowel sounds are normal.  Neurological: He is alert and oriented to person, place, and time.  Skin:  Face with erythematous rash on peri orbital around mouth and cheeks and forehead, Raised erythematous painful rash on buttocks, bilateral legs, thighs, raised rash on chest and abdomen.  Patient c/o severe itching and burning.  Nursing note and vitals reviewed.   Urgent Care Course   Clinical Course    Procedures (including critical care time)  Labs Review Labs Reviewed - No data to display  Imaging Review No results found.   Visual Acuity Review  Right Eye Distance:   Left Eye Distance:   Bilateral Distance:    Right Eye Near:   Left Eye Near:    Bilateral Near:         MDM   Anaphylaxis  Rash Hives  Upon intitial assessment patient is having mild swelling in face and periorbital area and erythema on brow, cheeks, nose, and periorbital area.  Bright red rash over buttocks, peri, and bilateral posterior thighs, 0.3mg  EPI SQ, 125mg  Soulmedrol IM 50mg  benadryl IM given.  After  5 minutes patient states he feels better. Approx 10-15 minutes later patient states itching and hot rash are back and he is having swelling in mouth and throat.  1mg  epi IM given.  After few minutes rash and itching complaints gone and patient feeling better.  [facility] called to transport patient to ED.  After 10 - 15 minutes EKG is done and shows ST with out acute ST-T changes.  Cardiac monitor attached, Oxygen 2liters [place] attached, IV left AC started and NS at [facility].  [facility] arrives and patient is transported to [facility] ED. Emergency Time face to face with patient over 60 minutes.  Lysbeth Penner, FNP 06/18/16 [place], FNP 06/18/16 1425

## 2016-06-18 NOTE — ED Triage Notes (Signed)
Pt arrives via [facility] from urgent care, pt reports he was stung by a bee and his epi pen had expired, pt reports he began to break out in hives and felt like his throat was closing. Pt received epi at urgent care and began to have chest pressure,nausea and tachycardia. Pt denies pain at this time, resp are e/u, no acute distress noted and minimal edema present to lips.

## 2016-06-18 NOTE — ED Notes (Signed)
Patient c/o still feeling "hot," cool cloth placed on the back of patient's neck for comfort.

## 2016-06-18 NOTE — ED Provider Notes (Signed)
[place] DEPT Provider Note   CSN: [id number] Arrival date & time: 06/18/16  1428     History   Chief Complaint Chief Complaint  Patient presents with  . Allergic Reaction    HPI ELIIJAH LAFAYETTE is a 60 y.o. male.  ALGER ASTON is a 60 y.o. Male who presents to the emergency department from urgent care after an anaphylactic reaction to a bee sting. Patient reports he was stung by a bee to his right knee around 12:30 pm today. He immediately had hives over his legs and trunk, tongue swelling, lip swelling, nausea and feeling short of breath. At home he took 50 mg of Benadryl and Tums and went to urgent care. At urgent care he received 0.3 mg of subcutaneous epinephrine, 125 mg Solu-Medrol IM, 50 mg IM Benadryl and then a second round of 1 mg of IM epinephrine. He was then transferred to the emergency department. At the time of my evaluation he reports he is feeling much better. He is just feeling very itchy and jittery. He reports his tongue and lip swelling has resolved. He does not feel short of breath. No hives. He reports having 1 previous anaphylactic reaction more than 8 years ago. He had an old EpiPen that was 61 years old and he did not use it at home. He denies fevers, chest pain, shortness of breath, abdominal pain, nausea, vomiting, tongue swelling, lip swelling.    The history is provided by the patient, medical records and the spouse. No language interpreter was used.    Past Medical History:  Diagnosis Date  . Arthritis   . Environmental allergies   . OSA on CPAP 08/01/2014  . Reflux     Patient Active Problem List   Diagnosis Date Noted  . Prediabetes 08/06/2015  . Mixed hyperlipidemia 08/06/2015  . Medication management 08/06/2015  . BMI 35.73,  adult 08/06/2015  . Onychomycosis 08/06/2015  . HTN (hypertension) 11/06/2014  . Cluster headaches 11/06/2014  . OSA treated with BiPAP 11/06/2014  . Vitamin D deficiency 11/06/2014  . Testosterone deficiency  11/06/2014  . Morbid obesity (BMI 35.85) 11/06/2014    Past Surgical History:  Procedure Laterality Date  . APPENDECTOMY  07/2012  . HEMORRHOID SURGERY  09/2012       Home Medications    Prior to Admission medications   Medication Sig Start Date End Date Taking? Authorizing Provider  albuterol (PROVENTIL HFA;VENTOLIN HFA) 108 (90 BASE) MCG/ACT inhaler Inhale 1 puff into the lungs every 6 (six) hours as needed for wheezing or shortness of breath. prn 02/28/15   Unk Pinto, MD  ANDROGEL PUMP 20.25 MG/ACT (1.62%) GEL APPLY 4 PUMPS EVERY DAY AS DIRECTED 04/13/16   Unk Pinto, MD  buPROPion (WELLBUTRIN XL) 300 MG 24 hr tablet  03/20/16   Historical Provider, MD  Cholecalciferol (VITAMIN D PO) Take 5,000 Units by mouth 3 (three) times daily.    Historical Provider, MD  EPINEPHrine 0.3 mg/0.3 mL IJ SOAJ injection Inject 0.3 mg into the muscle once. prn    Historical Provider, MD  fenofibrate micronized (LOFIBRA) 134 MG capsule TAKE ONE CAPSULE BY MOUTH ONCE DAILY BEFORE BREAKFAST FOR BLOOD FATS 04/30/16   Unk Pinto, MD  HYDROcodone-acetaminophen (NORCO/VICODIN) 5-325 MG tablet Take 1/2 to 1 tablet every 3 to 4 hours if needed for cough or pain 03/19/16   Unk Pinto, MD  ibuprofen (ADVIL,MOTRIN) 600 MG tablet Take 600 mg by mouth every 6 (six) hours as needed. Reported on 10/24/2015  Historical Provider, MD  losartan-hydrochlorothiazide (HYZAAR) 100-12.5 MG tablet Take 1 tablet by mouth daily. 03/04/16   Unk Pinto, MD  montelukast (SINGULAIR) 10 MG tablet Take 1 tablet (10 mg total) by mouth at bedtime. 02/10/16   Vicie Mutters, PA-C  OVER THE COUNTER MEDICATION Reported on 10/24/2015    Historical Provider, MD  phentermine (ADIPEX-P) 37.5 MG tablet Take 1 tablet every morning  And 1/2 tablet at 2 pm to for dieting & weight loss 05/28/16 10/28/16  Unk Pinto, MD  ranitidine (ZANTAC) 300 MG tablet Take 1 to 2 tablets daily for heartburn & reflux to allow wean and transition  from PPI / pantoprazole 08/06/15   Unk Pinto, MD  sertraline (ZOLOFT) 50 MG tablet TAKE 1 TABLET (50 MG TOTAL) BY MOUTH DAILY. 08/22/15   Unk Pinto, MD  sildenafil (REVATIO) 20 MG tablet Take 1 to 5 tabs daily as needed 01/27/16 01/26/17  Unk Pinto, MD  traZODone (DESYREL) 150 MG tablet TAKE 1/3 TO 1/2 TO 1 TABLET 1 HOUR BEFORE SLEEP 05/03/15   Historical Provider, MD    Family History Family History  Problem Relation Age of Onset  . Heart disease Mother   . Macular degeneration Mother   . Cancer - Colon Other   . Cancer - Prostate Other   . Stroke Maternal Aunt   . Cancer - Colon Paternal Uncle   . Cancer - Prostate Paternal Uncle     Social History Social History  Substance Use Topics  . Smoking status: Former Smoker    Quit date: 12/03/2012  . Smokeless tobacco: Never Used  . Alcohol use 1.2 oz/week    2 Standard drinks or equivalent per week     Comment: occ     Allergies   Bee venom and Lisinopril   Review of Systems Review of Systems  Constitutional: Negative for chills and fever.  HENT: Positive for facial swelling (Resolved). Negative for congestion and sore throat.   Eyes: Negative for visual disturbance.  Respiratory: Positive for shortness of breath (Resolved). Negative for cough and wheezing.   Cardiovascular: Negative for chest pain and palpitations.  Gastrointestinal: Positive for nausea (Resolved). Negative for abdominal pain, diarrhea and vomiting.  Genitourinary: Negative for dysuria.  Musculoskeletal: Negative for back pain and neck pain.  Skin: Positive for rash (Resolved).  Neurological: Negative for syncope, weakness and headaches.     Physical Exam Updated Vital Signs BP 119/69   Pulse 102   Temp 98.4 F (36.9 C) (Oral)   Resp 18   Ht 5\' 9"  (1.753 m)   Wt 102.5 kg   SpO2 97%   BMI 33.37 kg/m   Physical Exam  Constitutional: He is oriented to person, place, and time. He appears well-developed and well-nourished. No  distress.  HENT:  Head: Normocephalic and atraumatic.  Mouth/Throat: Oropharynx is clear and moist.  Eyes: Conjunctivae are normal. Pupils are equal, round, and reactive to light. Right eye exhibits no discharge. Left eye exhibits no discharge.  Neck: Neck supple.  Cardiovascular: Regular rhythm, normal heart sounds and intact distal pulses.  Exam reveals no gallop and no friction rub.   No murmur heard. HR 108.   Pulmonary/Chest: Effort normal and breath sounds normal. No respiratory distress. He has no wheezes. He has no rales.  Lungs good auscultation bilaterally.  Abdominal: Soft. There is no tenderness.  Musculoskeletal: He exhibits no edema.  Lymphadenopathy:    He has no cervical adenopathy.  Neurological: He is alert and oriented to person, place,  and time. Coordination normal.  Skin: Skin is warm and dry. Capillary refill takes less than 2 seconds. No rash noted. He is not diaphoretic. No erythema. No pallor.  No hives or rashes noted.  Psychiatric: He has a normal mood and affect. His behavior is normal.  Nursing note and vitals reviewed.    ED Treatments / Results  Labs (all labs ordered are listed, but only abnormal results are displayed) Labs Reviewed - No data to display  EKG  EKG Interpretation None       Radiology No results found.  Procedures Procedures (including critical care time)  Medications Ordered in ED Medications  methylPREDNISolone sodium succinate (SOLU-MEDROL) 125 mg/2 mL injection 125 mg (125 mg Intramuscular Not Given 06/18/16 1525)  diphenhydrAMINE (BENADRYL) injection 50 mg (50 mg Intramuscular Not Given 06/18/16 1527)  ondansetron (ZOFRAN) injection 4 mg (not administered)  famotidine (PEPCID) IVPB 20 mg premix (20 mg Intravenous New Bag/Given 06/18/16 1456)     Initial Impression / Assessment and Plan / ED Course  I have reviewed the triage vital signs and the nursing notes.  Pertinent labs & imaging results that were available  during my care of the patient were reviewed by me and considered in my medical decision making (see chart for details).  Clinical Course   Patient presented from urgent care after an anaphylactic reaction due to bee sting occurring around 12:30 PM today. Patient had diffuse hives, chest tightness, shortness of breath, tongue swelling and lip swelling. At urgent care he had Solu-Medrol, Benadryl and 2 rounds of epinephrine. Upon arrival to the emergency department the patient reports he is feeling much better. His hives, tongue and lip swelling have resolved. He tells me he just feels very itchy and jittery. Lungs are clear to auscultation bilaterally. No hives noted. I discussed when and how to use an EpiPen with the patient. Patient started on Pepcid. Will observe the patient for about 4 hours after epinephrine injection. This will be around 6:30 PM. At shift change patient care handed off to [name], MD who will disposition the patient.    Final Clinical Impressions(s) / ED Diagnoses   Final diagnoses:  Anaphylactic reaction, initial encounter  Bee sting, accidental or unintentional, initial encounter    New Prescriptions New Prescriptions   No medications on file     Waynetta Pean, PA-C 06/18/16 [place], MD 06/18/16 2211

## 2016-06-19 ENCOUNTER — Encounter: Payer: Self-pay | Admitting: Internal Medicine

## 2016-06-19 ENCOUNTER — Other Ambulatory Visit: Payer: Self-pay | Admitting: Internal Medicine

## 2016-07-16 ENCOUNTER — Telehealth: Payer: Self-pay

## 2016-07-16 NOTE — Telephone Encounter (Signed)
LM for patient to remind him of appt on Monday and to bring his CPAP or SD card to appt.

## 2016-07-20 ENCOUNTER — Encounter: Payer: Self-pay | Admitting: Neurology

## 2016-07-20 ENCOUNTER — Ambulatory Visit (INDEPENDENT_AMBULATORY_CARE_PROVIDER_SITE_OTHER): Payer: BLUE CROSS/BLUE SHIELD | Admitting: Neurology

## 2016-07-20 VITALS — BP 132/68 | HR 78 | Resp 18 | Ht 69.5 in | Wt 233.0 lb

## 2016-07-20 DIAGNOSIS — Z9989 Dependence on other enabling machines and devices: Secondary | ICD-10-CM

## 2016-07-20 DIAGNOSIS — G4733 Obstructive sleep apnea (adult) (pediatric): Secondary | ICD-10-CM | POA: Diagnosis not present

## 2016-07-20 NOTE — Patient Instructions (Signed)
We will try to switch her DME company to [facility]. Keep up the good work with your BiPAP treatment. You're fully compliant with treatment. I can see you in one year.

## 2016-07-20 NOTE — Progress Notes (Signed)
Subjective:    Patient ID: George Montgomery is a 60 y.o. male.  HPI     Interim history:   [name] is a 60 year old right-handed gentleman with an underlying medical  history of cervical and lumbar degenerative spine disease, anxiety, depression (not currently on medication d/t side), asthma, hypertension, obesity, and OSA, who presents for follow-up consultation of his history of obstructive sleep apnea and his history of cluster headaches. The patient is unaccompanied today. I last saw him on 07/18/2015, at which time he reported feeling better. Headaches had improved. He was compliant with BiPAP. He was on verapamil. Unfortunately, he had taken a fall on his boat and landed on both knees, was suffering from right knee pain since then and was supposed to see her orthopedic doctor. He was taking trazodone as needed, 150 mg strength, a quarter of a pill as needed.  Today, 07/20/2016: He reports doing okay. No further HAs, but does have pain at night, joints mostly. Uses a nasal mask. Would like to switch DME to a place closer to [facility]. Has  pI reviewed his BiPAP compliance data from 06/20/2016 through 07/19/2016, which is a total of 30 days, during which time he used his machine every night with percent used days greater than 4 hours at 100%, average AHI 1.3 per hour, BiPAP pressure of 17/13. Has a history of depression and has been on sertraline 50 mg once daily for the past year or 2 perhaps. He would like to see the tip come off of it especially since it causes ED. he has had nocturia. He gets up to 3 times per night. He has not seen a urologist.   Previously:  I saw him on 08/27/2014 and he canceled an appointment for December 2015. He was advised to follow-up in one month at the time. I suggested sumatriptan 100 mg strength for abortive treatment of cluster headaches at the time and starting him on verapamil for headache prevention. I asked him to be compliant with BiPAP therapy. I asked  him to undergo a brain MRI. He did not pursue this as his headaches improved after he started the verapamil and he started feeling better.   I reviewed his BiPAP compliance data from 06/18/2015 through 07/17/2015 which is a total of 30 days during which time he was 100% compliant. Average usage was 8 hours and 57 minutes. He estimates that he sleeps about 6-7 hours on an average night. Sometimes he has to get up to use the bathroom. Average AHI was 1.5 per hour, pressure of 17/13, leak low.    The patient reports new onset HA since 08/02/14, started abruptly. No triggers noted. He recently had the flu shot. He also stopped his testosterone replacement about a month ago. He does endorse more stress, work related. He has also recently increased his caffeine intake. He has had a daily headache which lasts up to 2 hours. It typically starts in the evening. It is right-sided behind his eye and retro-orbital also in the temporal area and radiates to the back. It is severe. He does get puffiness around the right eye and occasional runny nose on the right side. He does not have any double vision or blurry vision. Glasses have not been checked in over 18 months. He does not have nausea but does have to lie down. Hydrocodone has not helped it only 1 pill and he has to take 2. He has also taken high-dose Motrin, 600 mg, and has tried a muscle  relaxant, he has to lie down. He has never had anything like this before. He does not have a history of migraines before. He does not note any neurological symptoms and in between he feels at baseline. He has been reluctant to do anything besides the minimal for fear of exacerbating these headaches. They have been daily since 08/02/2014. He was prescribed Frovatriptan on 08/21/14 by Dr. Bebe Shaggy, but his insurance did not cover that. He has tried sumatriptan and 50 mg which he felt has helped after about 15-20 minutes.he has found relief with taking a warm bath. He has also tried heat  pad on his head, which provided some relief.     The patient recalls being diagnosed with OSA some 12 years ago, severe, per patient. Over the years, he has had about 3 sleep studies, last some 3 years ago. He was originally placed on CPAP and most recently at the last sleep study he remembers that he was switched to BiPAP. He brought his BiPAP machine in for review and we were able to get a download: This was from 05/01/2014 through 07/31/2014 during which time he used his BiPAP machine every night without any skipped days and his percent used days greater than 4 hours was 100% indicating superb compliance, average usage was 8 hours and 7 minutes approximately. Residual AHI was low at 0.7 per hour and leak was very low at 1 minute and 58 seconds as far as large leak per day dose. His pressure is 17/13 cm.     His typical bedtime is between 10:30 and 11 PM and usually falls asleep quickly. Weight time is between 6 and 6:30 AM. He estimates that he gets about 7 hours of sleep. He denies any morning headaches or severe daytime somnolence. He does have an ESS of 11/24 today. He denies significant restless leg symptoms. His first sleep study may have been about 12 years ago. He gained quite a bit of weight after he quit smoking about 2 years ago. He gets his DME care through [facility].    His Past Medical History Is Significant For: Past Medical History:  Diagnosis Date  . Arthritis   . Environmental allergies   . OSA on CPAP 08/01/2014  . Reflux     His Past Surgical History Is Significant For: Past Surgical History:  Procedure Laterality Date  . APPENDECTOMY  07/2012  . HEMORRHOID SURGERY  09/2012    His Family History Is Significant For: Family History  Problem Relation Age of Onset  . Heart disease Mother   . Macular degeneration Mother   . Cancer - Colon Other   . Cancer - Prostate Other   . Stroke Maternal Aunt   . Cancer - Colon Paternal Uncle   . Cancer - Prostate  Paternal Uncle     His Social History Is Significant For: Social History   Social History  . Marital status: Married    Spouse name: [name]  . Number of children: 4  . Years of education: college   Occupational History  .      [employer]   Social History Main Topics  . Smoking status: Former Smoker    Quit date: 12/03/2012  . Smokeless tobacco: Never Used  . Alcohol use 1.2 oz/week    2 Standard drinks or equivalent per week     Comment: occ  . Drug use: No  . Sexual activity: Not Asked   Other Topics Concern  . None  Social History Narrative   Right handed, caffeine 3-4 cups daily, Married, 4 kids, 9 g kids., FT insurance agent (self employed). 4 yrs college    His Allergies Are:  Allergies  Allergen Reactions  . Bee Venom Anaphylaxis  . Lisinopril Other (See Comments)    Patient intolerant of drug which caused his tongue to tingle.  :   His Current Medications Are:  Outpatient Encounter Prescriptions as of 07/20/2016  Medication Sig  . albuterol (PROVENTIL HFA;VENTOLIN HFA) 108 (90 BASE) MCG/ACT inhaler Inhale 1 puff into the lungs every 6 (six) hours as needed for wheezing or shortness of breath. prn  . ANDROGEL PUMP 20.25 MG/ACT (1.62%) GEL APPLY 4 PUMPS EVERY DAY AS DIRECTED  . Cholecalciferol (VITAMIN D PO) Take 5,000 Units by mouth 3 (three) times daily.  Marland Kitchen EPINEPHrine 0.3 mg/0.3 mL IJ SOAJ injection Inject 0.3 mg into the muscle once. prn  . fenofibrate micronized (LOFIBRA) 134 MG capsule TAKE ONE CAPSULE BY MOUTH ONCE DAILY BEFORE BREAKFAST FOR BLOOD FATS  . HYDROcodone-acetaminophen (NORCO/VICODIN) 5-325 MG tablet Take 1/2 to 1 tablet every 3 to 4 hours if needed for cough or pain  . ibuprofen (ADVIL,MOTRIN) 600 MG tablet Take 600 mg by mouth every 6 (six) hours as needed. Reported on 10/24/2015  . losartan-hydrochlorothiazide (HYZAAR) 100-12.5 MG tablet Take 1 tablet by mouth daily.  . montelukast (SINGULAIR) 10 MG tablet Take 1 tablet (10 mg  total) by mouth at bedtime. (Patient taking differently: Take 10 mg by mouth as needed. )  . OVER THE COUNTER MEDICATION Reported on 10/24/2015  . phentermine (ADIPEX-P) 37.5 MG tablet Take 1 tablet every morning  And 1/2 tablet at 2 pm to for dieting & weight loss  . ranitidine (ZANTAC) 300 MG tablet Take 1 to 2 tablets daily for heartburn & reflux to allow wean and transition from PPI / pantoprazole  . sertraline (ZOLOFT) 50 MG tablet TAKE 1 TABLET (50 MG TOTAL) BY MOUTH DAILY.  . sildenafil (REVATIO) 20 MG tablet Take 1 to 5 tabs daily as needed  . traZODone (DESYREL) 150 MG tablet TAKE 1/3 TO 1/2 TO 1 TABLET 1 HOUR BEFORE SLEEP AS NEEDED  . diphenhydrAMINE (BENADRYL) 25 mg capsule Take 1 capsule (25 mg total) by mouth every 8 (eight) hours as needed for itching (rash).  . famotidine (PEPCID) 40 MG tablet Take 1 tablet (40 mg total) by mouth daily.  . [DISCONTINUED] buPROPion (WELLBUTRIN XL) 300 MG 24 hr tablet    No facility-administered encounter medications on file as of 07/20/2016.   :  Review of Systems:  Out of a complete 14 point review of systems, all are reviewed and negative with the exception of these symptoms as listed below: Review of Systems  Neurological:       Patient reports that he gets up several times during the night to urinate. Had some mask leaks but recently received a new mask. Has some trouble staying asleep and falling back to sleep.     Objective:  Neurologic Exam  Physical Exam Physical Examination:   Vitals:   07/20/16 1047  BP: 132/68  Pulse: 78  Resp: 18   General Examination: The patient is a very pleasant 60 y.o. male in no acute distress. He appears well-developed and well-nourished and well groomed. He is obese. He is in good spirits today.   HEENT: Normocephalic, atraumatic, pupils are equal, round and reactive to light and accommodation. Funduscopic exam is normal with sharp disc margins noted. Extraocular tracking is  good without limitation to  gaze excursion or nystagmus noted. Normal smooth pursuit is noted. Hearing is grossly intact. Face is symmetric with normal facial animation and normal facial sensation. Speech is clear with no dysarthria noted. There is no hypophonia. There is no lip, neck/head, jaw or voice tremor. Neck is supple with full range of passive and active motion. There are no carotid bruits on auscultation. Oropharynx exam reveals: moderate mouth dryness, adequate dental hygiene and moderate airway crowding, due to thicker soft palate, and larger tongue and redundant soft palate. Mallampati is class II. Tongue protrudes centrally and palate elevates symmetrically. Tonsils are absent.   Chest: Clear to auscultation without wheezing, rhonchi or crackles noted.  Heart: S1+S2+0, regular and normal without murmurs, rubs or gallops noted.   Abdomen: Soft, non-tender and non-distended with normal bowel sounds appreciated on auscultation.  Extremities: There is no pitting edema in the distal lower extremities bilaterally. Pedal pulses are intact.  Skin: Warm and dry without trophic changes noted.   Musculoskeletal: exam reveals no obvious joint deformities, tenderness or joint swelling or erythema, with the exception of mild right knee pain.   Neurologically:  Mental status: The patient is awake, alert and oriented in all 4 spheres. His immediate and remote memory, attention, language skills and fund of knowledge are appropriate. There is no evidence of aphasia, agnosia, apraxia or anomia. Speech is clear with normal prosody and enunciation. Thought process is linear. Mood is normal and affect is normal.  Cranial nerves II - XII are as described above under HEENT exam. In addition: shoulder shrug is normal with equal shoulder height noted. Motor exam: Normal bulk, strength and tone is noted. There is no drift, tremor or rebound. Romberg is negative. Reflexes are 2+ throughout. Fine motor skills and coordination: intact with  normal finger taps, normal hand movements, normal rapid alternating patting, normal foot taps and normal foot agility.  Cerebellar testing: No dysmetria or intention tremor on finger to nose testing. Heel to shin is unremarkable bilaterally. There is no truncal or gait ataxia.   Sensory exam: intact to light touch in the upper and lower extremities.  Gait, station and balance: He stands easily. No veering to one side is noted. No leaning to one side is noted. Posture is age-appropriate and stance is narrow based. Gait shows normal stride length and normal pace. No problems turning are noted. He turns en bloc. Tandem walk is unremarkable.            Assessment and Plan:   In summary, UZZIEL RIEDINGER is a very pleasant 60 year old male with an underlying medical  history of cervical and lumbar degenerative spine disease, anxiety, depression, asthma, hypertension, obesity, cluster headaches and history of obstructive sleep apnea on BiPAP therapy at home who presents for follow-up consultation for OSA and cluster HAs. He has been fully compliant with BiPAP with ongoing good results, however he does have nighttime pain and disruption of his sleep secondary to nocturia. He is encouraged to talk to his primary care physician about nocturia. In addition, he has not been on verapamil because he has been feeling well with regards to his headaches. His physical exam is stable. He is working on weight loss. Compared to last year he has increased weight and then lost it, about 5 pounds less than last year around this time. He is wondering whether he needs to be on his antidepressant. He's encouraged to talk to his primary care physician about reducing it in particular  because he worries about ED.  I will see him back next year for routine follow-up. I answered all his questions today and he was in agreement. I spent 25 minutes in total face-to-face time with the patient, more than 50% of which was spent in counseling and  coordination of care, reviewing test results, reviewing medication and discussing or reviewing the diagnosis of cluster HAs, depression, and OSA, its prognosis and treatment options.

## 2016-07-25 ENCOUNTER — Other Ambulatory Visit: Payer: Self-pay | Admitting: Internal Medicine

## 2016-07-28 ENCOUNTER — Ambulatory Visit: Payer: Self-pay | Admitting: Internal Medicine

## 2016-07-31 ENCOUNTER — Other Ambulatory Visit: Payer: Self-pay | Admitting: Internal Medicine

## 2016-07-31 DIAGNOSIS — E669 Obesity, unspecified: Secondary | ICD-10-CM

## 2016-08-06 ENCOUNTER — Other Ambulatory Visit: Payer: Self-pay | Admitting: Internal Medicine

## 2016-08-06 DIAGNOSIS — E669 Obesity, unspecified: Secondary | ICD-10-CM

## 2016-08-20 ENCOUNTER — Ambulatory Visit: Payer: Self-pay | Admitting: Internal Medicine

## 2016-08-20 ENCOUNTER — Ambulatory Visit (INDEPENDENT_AMBULATORY_CARE_PROVIDER_SITE_OTHER): Payer: BLUE CROSS/BLUE SHIELD | Admitting: *Deleted

## 2016-08-20 DIAGNOSIS — Z23 Encounter for immunization: Secondary | ICD-10-CM

## 2016-08-20 NOTE — Progress Notes (Signed)
Patient presents for Flu vaccine.  Patient states he is also due for a Pneumovax.  I showed patient that Epic is not indicating he needs one and that he is not a diabetic, no Hx of COPD, nothing indicative of autoimmune Dz.  Patient states he has a history of multiple Dx of pneumonia. States he will pay for cost of Pneumovax if not covered by insurance.  I informed patient that he will need to have his medical history updated at his next f/u appointment with the provider.

## 2016-08-29 ENCOUNTER — Other Ambulatory Visit: Payer: Self-pay | Admitting: Internal Medicine

## 2016-09-01 ENCOUNTER — Other Ambulatory Visit: Payer: Self-pay | Admitting: Internal Medicine

## 2016-09-01 MED ORDER — AZITHROMYCIN 250 MG PO TABS: ORAL_TABLET | ORAL | 0 refills | Status: DC

## 2016-09-01 MED ORDER — PREDNISONE 20 MG PO TABS: ORAL_TABLET | ORAL | 0 refills | Status: DC

## 2016-09-15 ENCOUNTER — Encounter: Payer: Self-pay | Admitting: Internal Medicine

## 2016-09-15 ENCOUNTER — Ambulatory Visit (INDEPENDENT_AMBULATORY_CARE_PROVIDER_SITE_OTHER): Payer: BLUE CROSS/BLUE SHIELD | Admitting: Internal Medicine

## 2016-09-15 VITALS — BP 138/72 | HR 92 | Temp 97.7°F | Resp 18 | Ht 69.5 in | Wt 239.0 lb

## 2016-09-15 DIAGNOSIS — J0410 Acute tracheitis without obstruction: Secondary | ICD-10-CM

## 2016-09-15 DIAGNOSIS — J0140 Acute pansinusitis, unspecified: Secondary | ICD-10-CM

## 2016-09-15 MED ORDER — PREDNISONE 20 MG PO TABS: ORAL_TABLET | ORAL | 0 refills | Status: DC

## 2016-09-15 MED ORDER — LEVOFLOXACIN 500 MG PO TABS: ORAL_TABLET | ORAL | 1 refills | Status: DC

## 2016-09-15 NOTE — Progress Notes (Signed)
Subjective:    Patient ID: George Montgomery, male    DOB: 1956/01/14, 60 y.o.   MRN: [id number]  HPI  Patient presents with a 1 month prodrome of upper/lower Respiratory infection and has taken 2 Z-paks and completing  the 2sd Z-pak about 10 days ago. He reports persistent sinus congestion, pressure, HA and productive cough. Denies fever, chills, sweats, rash or dyspnea.   Medication Sig  . albuterol (PROVENTIL HFA;VENTOLIN HFA) 108 (90 BASE) MCG/ACT inhaler Inhale 1 puff into the lungs every 6 (six) hours as needed for wheezing or shortness of breath. prn  . ANDROGEL PUMP 20.25 MG/ACT (1.62%) GEL APPLY 4 PUMPS EVERY DAY AS DIRECTED  . Cholecalciferol (VITAMIN D PO) Take 5,000 Units by mouth 3 (three) times daily.  Marland Kitchen EPINEPHrine 0.3 mg/0.3 mL IJ SOAJ injection Inject 0.3 mg into the muscle once. prn  . fenofibrate micronized (LOFIBRA) 134 MG capsule TAKE ONE CAPSULE BY MOUTH ONCE DAILY BEFORE BREAKFAST FOR BLOOD FATS  . HYDROcodone-acetaminophen (NORCO/VICODIN) 5-325 MG tablet Take 1/2 to 1 tablet every 3 to 4 hours if needed for cough or pain  . ibuprofen (ADVIL,MOTRIN) 600 MG tablet Take 600 mg by mouth every 6 (six) hours as needed. Reported on 10/24/2015  . losartan-hydrochlorothiazide (HYZAAR) 100-12.5 MG tablet TAKE ONE TABLET BY MOUTH ONCE DAILY  . montelukast (SINGULAIR) 10 MG tablet Take 1 tablet (10 mg total) by mouth at bedtime. (Patient taking differently: Take 10 mg by mouth as needed. )  . OVER THE COUNTER MEDICATION Reported on 10/24/2015  . phentermine (ADIPEX-P) 37.5 MG tablet TAKE 1 TABLET EVERY MORNING AND 1/2 TABLET AT 2PM FOR DIETING AND WEIGHT LOSS.  . ranitidine (ZANTAC) 300 MG tablet Take 1 to 2 tablets daily for heartburn & reflux to allow wean and transition from PPI / pantoprazole  . sertraline (ZOLOFT) 50 MG tablet TAKE ONE TABLET BY MOUTH ONCE DAILY  . sildenafil (REVATIO) 20 MG tablet Take 1 to 5 tabs daily as needed  . traZODone (DESYREL) 150 MG tablet TAKE 1/3 TO 1/2 TO  1 TABLET 1 HOUR BEFORE SLEEP AS NEEDED  . diphenhydrAMINE (BENADRYL) 25 mg capsule Take 1 capsule (25 mg total) by mouth every 8 (eight) hours as needed for itching (rash).  . famotidine (PEPCID) 40 MG tablet Take 1 tablet (40 mg total) by mouth daily.   Allergies  Allergen Reactions  . Bee Venom Anaphylaxis  . Lisinopril Other (See Comments)    Patient intolerant of drug which caused his tongue to tingle.   Past Medical History:  Diagnosis Date  . Arthritis   . Environmental allergies   . OSA on CPAP 08/01/2014  . Reflux    Past Surgical History:  Procedure Laterality Date  . APPENDECTOMY  07/2012  . HEMORRHOID SURGERY  09/2012   Review of Systems  10 point systems review negative except as above.    Objective:   Physical Exam  BP 138/72   Pulse 92   Temp 97.7 F (36.5 C)   Resp 18   Ht 5' 9.5" (1.765 m)   Wt 239 lb (108.4 kg)   BMI 34.79 kg/m   Congested cough. No Stridor. Skin clear.   HEENT - Eac's patent. TM's Nl. EOM's full. PERRLA. Bilat fronto-maxillary tenderness. NasoOroPharynx clear. Neck - supple. Nl Thyroid. Carotids 2+ & No bruits, nodes, JVD Chest - Scattered medium & coarse rales/rhomchi with a few coarse exp wheezes. Cor - Nl HS. RRR w/o sig MGR. PP 1(+). No edema. MS- FROM  w/o deformities. Muscle power, tone and bulk Nl. Gait Nl. Neuro - No obvious Cr N abnormalities. Sensory, motor and Cerebellar functions appear Nl w/o focal abnormalities.    Assessment & Plan:   1. Tracheitis  - levofloxacin (LEVAQUIN) 500 MG tablet; Take 1 tablet daily with food for infection  Dispense: 15 tablet; Refill: 1 - predniSONE (DELTASONE) 20 MG tablet; 1 tab 3 x day for 3 days, then 1 tab 2 x day for 3 days, then 1 tab 1 x day for 5 days  Dispense: 20 tablet; Refill: 0  2. Acute pansinusitis, recurrence not specified  - levofloxacin (LEVAQUIN) 500 MG tablet; Take 1 tablet daily with food for infection  Dispense: 15 tablet; Refill: 1 - predniSONE (DELTASONE) 20 MG  tablet; 1 tab 3 x day for 3 days, then 1 tab 2 x day for 3 days, then 1 tab 1 x day for 5 days  Dispense: 20 tablet; Refill: 0  - discussed meds & SE"s.  - ROV - prn

## 2016-10-01 ENCOUNTER — Ambulatory Visit: Payer: Self-pay | Admitting: Internal Medicine

## 2016-10-13 ENCOUNTER — Other Ambulatory Visit: Payer: Self-pay | Admitting: Internal Medicine

## 2016-10-13 DIAGNOSIS — E349 Endocrine disorder, unspecified: Secondary | ICD-10-CM

## 2016-10-14 NOTE — Telephone Encounter (Signed)
Androgel pump was called into [facility] @ 9:10am

## 2016-10-15 ENCOUNTER — Other Ambulatory Visit: Payer: Self-pay | Admitting: Internal Medicine

## 2016-10-15 DIAGNOSIS — E349 Endocrine disorder, unspecified: Secondary | ICD-10-CM

## 2016-10-15 NOTE — Telephone Encounter (Signed)
Please call Androgel 

## 2016-10-21 ENCOUNTER — Other Ambulatory Visit: Payer: Self-pay | Admitting: *Deleted

## 2016-10-21 ENCOUNTER — Ambulatory Visit (INDEPENDENT_AMBULATORY_CARE_PROVIDER_SITE_OTHER): Payer: BLUE CROSS/BLUE SHIELD | Admitting: Internal Medicine

## 2016-10-21 VITALS — BP 126/84 | HR 76 | Temp 97.5°F | Resp 16 | Ht 69.5 in | Wt 245.0 lb

## 2016-10-21 DIAGNOSIS — K219 Gastro-esophageal reflux disease without esophagitis: Secondary | ICD-10-CM

## 2016-10-21 DIAGNOSIS — E291 Testicular hypofunction: Secondary | ICD-10-CM | POA: Diagnosis not present

## 2016-10-21 DIAGNOSIS — E559 Vitamin D deficiency, unspecified: Secondary | ICD-10-CM | POA: Diagnosis not present

## 2016-10-21 DIAGNOSIS — R7303 Prediabetes: Secondary | ICD-10-CM

## 2016-10-21 DIAGNOSIS — E349 Endocrine disorder, unspecified: Secondary | ICD-10-CM

## 2016-10-21 DIAGNOSIS — I10 Essential (primary) hypertension: Secondary | ICD-10-CM | POA: Diagnosis not present

## 2016-10-21 DIAGNOSIS — M25511 Pain in right shoulder: Secondary | ICD-10-CM | POA: Diagnosis not present

## 2016-10-21 DIAGNOSIS — Z79899 Other long term (current) drug therapy: Secondary | ICD-10-CM | POA: Diagnosis not present

## 2016-10-21 DIAGNOSIS — E782 Mixed hyperlipidemia: Secondary | ICD-10-CM

## 2016-10-21 LAB — CBC WITH DIFFERENTIAL/PLATELET
BASOS PCT: 1 %
Basophils Absolute: 66 cells/uL (ref 0–200)
Eosinophils Absolute: 132 cells/uL (ref 15–500)
Eosinophils Relative: 2 %
HEMATOCRIT: 43.9 % (ref 38.5–50.0)
HEMOGLOBIN: 15.1 g/dL (ref 13.2–17.1)
LYMPHS ABS: 2508 {cells}/uL (ref 850–3900)
Lymphocytes Relative: 38 %
MCH: 31.9 pg (ref 27.0–33.0)
MCHC: 34.4 g/dL (ref 32.0–36.0)
MCV: 92.6 fL (ref 80.0–100.0)
MONO ABS: 594 {cells}/uL (ref 200–950)
MPV: 10.1 fL (ref 7.5–12.5)
Monocytes Relative: 9 %
NEUTROS ABS: 3300 {cells}/uL (ref 1500–7800)
NEUTROS PCT: 50 %
Platelets: 217 10*3/uL (ref 140–400)
RBC: 4.74 MIL/uL (ref 4.20–5.80)
RDW: 13.1 % (ref 11.0–15.0)
WBC: 6.6 10*3/uL (ref 3.8–10.8)

## 2016-10-21 LAB — TSH: TSH: 1.43 mIU/L (ref 0.40–4.50)

## 2016-10-21 LAB — HEMOGLOBIN A1C
Hgb A1c MFr Bld: 5.5 %
Mean Plasma Glucose: 111 mg/dL

## 2016-10-21 MED ORDER — RANITIDINE HCL 300 MG PO TABS: ORAL_TABLET | ORAL | 3 refills | Status: DC

## 2016-10-21 NOTE — Progress Notes (Signed)
[facility] Unk Pinto, M.D.        Uvaldo Bristle. Silverio Lay, P.A.-C       Starlyn Skeans, P.A.-C  [facility]                [address] [place], [place] [id number] Telephone [phone] Telefax [phone] ______________________________________________________________________     This very nice 61 y.o. MWM presents for 6 month follow up with Hypertension, Hyperlipidemia, Pre-Diabetes and Vitamin D Deficiency. Patient also has GERD controlled with prudent & meds.       Patient also relate ongoing issues with c/o pain in his Right shoulder and is requesting a pain medicine.      Patient is treated for HTN circa Dec 2015 & BP has been controlled at home. Today's BP is at goal - 126/84. Patient has had no complaints of any cardiac type chest pain, palpitations, dyspnea/orthopnea/PND, dizziness, claudication, or dependent edema.     Hyperlipidemia is controlled with diet & meds. Patient denies myalgias or other med SE's. Last Lipids were at goal albeit elevated Trig's: Lab Results  Component Value Date   CHOL 159 12/27/2015   HDL 32 (L) 12/27/2015   LDLCALC 68 12/27/2015   TRIG 293 (H) 12/27/2015   CHOLHDL 5.0 12/27/2015      Also, the patient has history of  PreDiabetes and Insulin Resistance with A1c 5.6% and Insulin of "29"  And he has had no symptoms of reactive hypoglycemia, diabetic polys, paresthesias or visual blurring.  Last A1c was near goal: Lab Results  Component Value Date   HGBA1C 5.7 (H) 12/27/2015      Patient has hx/o Testosterone Deficiency and is on replacement therapy. Further, the patient also has history of Vitamin D Deficiency in Jan  2016 of "21" and supplements vitamin D without any suspected side-effects. Last vitamin D was still low:  Lab Results  Component Value Date   VD25OH 56 12/27/2015   Medication Sig  . albuterolHFAinhaler 1 puff  every 6  hrs as needed  .  ANDROGEL PUMP 1.62% GEL APPLY FOUR PUMPS  TOPICALLY ONCE DAILY AS DIRECTED  . VITAMIN D Take 5,000 Units by mouth 3 (three) times daily.  Marland Kitchen EPINEPHrine 0.3 mg/0.3 mL injec Inject 0.3 mg into the muscle once. prn  . fenofibrate  134 MG capsule TAKE ONE CAP ONCE DAILY   . ibuprofen 600 MG tablet Take  every 6 hours as needed.   Marland Kitchen losartan-hctz 100-12.5 MG tablet TAKE ONE TAB ONCE DAILY  . montelukast10 MG tablet Take 1 tab at bedtime.   . phentermine  37.5 MG tablet TAKE 1 TAB EVERY MORNING AND 1/2 TAB AT 2PM FOR DIETING AND WEIGHT LOSS.  Marland Kitchen sertraline  50 MG tablet TAKE ONE TAB  ONCE DAILY  . sildenafil 20 MG tablet Take 1 to 5 tabs daily as needed  . traZODone  150 MG tablet TAKE 1/3-1/2-1 TAB 1 HR BEFORE SLEEP AS NEEDED  . ranitidine  300 MG tablet Take 1 to 2 tab daily for heartburn & reflux to allow wean and transition from PPI / pantoprazole  . diphenhydrAMINE  25 mg capsule Take 1 cap every 8  hours as needed for itching.  . famotidine  40 MG tablet Take 1 tab daily.   Allergies  Allergen Reactions  . Bee Venom Anaphylaxis  . Lisinopril Other (See Comments)  Patient intolerant of drug which caused his tongue to tingle.   PMHx:   Past Medical History:  Diagnosis Date  . Arthritis   . Environmental allergies   . OSA on CPAP 08/01/2014  . Reflux    Immunization History  Administered Date(s) Administered  . Influenza Split 08/06/2015  . Influenza,inj,quad, With Preservative 08/20/2016  . Pneumococcal Polysaccharide-23 08/20/2016   Past Surgical History:  Procedure Laterality Date  . APPENDECTOMY  07/2012  . HEMORRHOID SURGERY  09/2012   FHx:    Reviewed / unchanged  SHx:    Reviewed / unchanged  Systems Review:  Constitutional: Denies fever, chills, wt changes, headaches, insomnia, fatigue, night sweats, change in appetite. Eyes: Denies redness, blurred vision, diplopia, discharge, itchy, watery eyes.  ENT: Denies discharge, congestion, post nasal drip, epistaxis,  sore throat, earache, hearing loss, dental pain, tinnitus, vertigo, sinus pain, snoring.  CV: Denies chest pain, palpitations, irregular heartbeat, syncope, dyspnea, diaphoresis, orthopnea, PND, claudication or edema. Respiratory: denies cough, dyspnea, DOE, pleurisy, hoarseness, laryngitis, wheezing.  Gastrointestinal: Denies dysphagia, odynophagia, heartburn, reflux, water brash, abdominal pain or cramps, nausea, vomiting, bloating, diarrhea, constipation, hematemesis, melena, hematochezia  or hemorrhoids. Genitourinary: Denies dysuria, frequency, urgency, nocturia, hesitancy, discharge, hematuria or flank pain. Musculoskeletal: Denies arthralgias, myalgias, stiffness, jt. swelling, pain, limping or strain/sprain.  Skin: Denies pruritus, rash, hives, warts, acne, eczema or change in skin lesion(s). Neuro: No weakness, tremor, incoordination, spasms, paresthesia or pain. Psychiatric: Denies confusion, memory loss or sensory loss. Endo: Denies change in weight, skin or hair change.  Heme/Lymph: No excessive bleeding, bruising or enlarged lymph nodes.  Physical Exam  BP 126/84   Pulse 76   Temp 97.5 F (36.4 C)   Resp 16   Ht 5' 9.5" (1.765 m)   Wt 245 lb (111.1 kg)   BMI 35.66 kg/m   Appears well nourished and in no distress.  Eyes: PERRLA, EOMs, conjunctiva no swelling or erythema. Sinuses: No frontal/maxillary tenderness ENT/Mouth: EAC's clear, TM's nl w/o erythema, bulging. Nares clear w/o erythema, swelling, exudates. Oropharynx clear without erythema or exudates. Oral hygiene is good. Tongue normal, non obstructing. Hearing intact.  Neck: Supple. Thyroid nl. Car 2+/2+ without bruits, nodes or JVD. Chest: Respirations nl with BS clear & equal w/o rales, rhonchi, wheezing or stridor.  Cor: Heart sounds normal w/ regular rate and rhythm without sig. murmurs, gallops, clicks, or rubs. Peripheral pulses normal and equal  without edema.  Abdomen: Soft & bowel sounds normal. Non-tender  w/o guarding, rebound, hernias, masses, or organomegaly.  Lymphatics: Unremarkable.  Musculoskeletal: Right shoulder ROM is impeded by obvious discomfort.  Skin: Warm, dry without exposed rashes, lesions or ecchymosis apparent.  Neuro: Cranial nerves intact, reflexes equal bilaterally. Sensory-motor testing grossly intact. Tendon reflexes grossly intact.  Pysch: Alert & oriented x 3.  Insight and judgement nl & appropriate. No ideations.  Assessment and Plan:  1. Essential hypertension  - Continue medication, monitor blood pressure at home.  - Continue DASH diet. Reminder to go to the ER if any CP,  SOB, nausea, dizziness, severe HA, changes vision/speech,  left arm numbness and tingling and jaw pain.  - CBC with Differential/Platelet - BASIC METABOLIC PANEL WITH GFR - TSH  2. Mixed hyperlipidemia  - Continue diet/meds, exercise,& lifestyle modifications.  - Continue monitor periodic cholesterol/liver & renal functions   - Hepatic function panel - Lipid panel - TSH  3. Prediabetes  - Continue diet, exercise, lifestyle modifications.  - Monitor appropriate labs. - Hemoglobin A1c - Insulin,  random  4. Vitamin D deficiency  - Continue supplementation. - VITAMIN D 25 Hydroxy  5. Testosterone deficiency  - Testosterone  6. Gastroesophageal reflux disease   7. Medication management  - CBC with Differential/Platelet - BASIC METABOLIC PANEL WITH GFR - Hepatic function panel - Magnesium  8. Pain in joint of right shoulder  - Ambulatory referral to Orthopedics       Recommended regular exercise, BP monitoring, weight control, and discussed med and SE's. Recommended labs to assess and monitor clinical status. Further disposition pending results of labs. Over 30 minutes of exam, counseling, chart review was performed

## 2016-10-21 NOTE — Patient Instructions (Signed)

## 2016-10-22 ENCOUNTER — Other Ambulatory Visit: Payer: Self-pay | Admitting: Internal Medicine

## 2016-10-22 ENCOUNTER — Other Ambulatory Visit: Payer: Self-pay | Admitting: *Deleted

## 2016-10-22 DIAGNOSIS — M7521 Bicipital tendinitis, right shoulder: Secondary | ICD-10-CM

## 2016-10-22 DIAGNOSIS — E349 Endocrine disorder, unspecified: Secondary | ICD-10-CM

## 2016-10-22 LAB — BASIC METABOLIC PANEL WITH GFR
BUN: 17 mg/dL (ref 7–25)
CALCIUM: 9.2 mg/dL (ref 8.6–10.3)
CHLORIDE: 102 mmol/L (ref 98–110)
CO2: 27 mmol/L (ref 20–31)
Creat: 1.29 mg/dL — ABNORMAL HIGH (ref 0.70–1.25)
GFR, EST NON AFRICAN AMERICAN: 59 mL/min — AB (ref >=60)
GFR, Est African American: 69 mL/min (ref >=60)
GLUCOSE: 97 mg/dL (ref 65–99)
POTASSIUM: 4 mmol/L (ref 3.5–5.3)
SODIUM: 139 mmol/L (ref 135–146)

## 2016-10-22 LAB — HEPATIC FUNCTION PANEL
ALK PHOS: 74 U/L (ref 40–115)
ALT: 32 U/L (ref 9–46)
AST: 23 U/L (ref 10–35)
Albumin: 4.4 g/dL (ref 3.6–5.1)
BILIRUBIN DIRECT: 0.1 mg/dL (ref <=0.2)
BILIRUBIN INDIRECT: 0.5 mg/dL (ref 0.2–1.2)
TOTAL PROTEIN: 7.3 g/dL (ref 6.1–8.1)
Total Bilirubin: 0.6 mg/dL (ref 0.2–1.2)

## 2016-10-22 LAB — LIPID PANEL
CHOL/HDL RATIO: 4.6 ratio (ref <5.0)
CHOLESTEROL: 157 mg/dL (ref <200)
HDL: 34 mg/dL — ABNORMAL LOW (ref >40)
LDL CALC: 77 mg/dL (ref <100)
Triglycerides: 232 mg/dL — ABNORMAL HIGH (ref <150)
VLDL: 46 mg/dL — AB (ref <30)

## 2016-10-22 LAB — TESTOSTERONE: TESTOSTERONE: 293 ng/dL (ref 250–827)

## 2016-10-22 LAB — VITAMIN D 25 HYDROXY (VIT D DEFICIENCY, FRACTURES): Vit D, 25-Hydroxy: 60 ng/mL (ref 30–100)

## 2016-10-22 LAB — INSULIN, RANDOM: Insulin: 35.4 u[IU]/mL — ABNORMAL HIGH (ref 2.0–19.6)

## 2016-10-22 LAB — MAGNESIUM: Magnesium: 2.1 mg/dL (ref 1.5–2.5)

## 2016-10-22 MED ORDER — HYDROCODONE-ACETAMINOPHEN 5-325 MG PO TABS: ORAL_TABLET | ORAL | 0 refills | Status: DC

## 2016-10-23 ENCOUNTER — Encounter: Payer: Self-pay | Admitting: Internal Medicine

## 2016-11-06 ENCOUNTER — Other Ambulatory Visit: Payer: Self-pay | Admitting: Internal Medicine

## 2016-11-06 ENCOUNTER — Other Ambulatory Visit: Payer: Self-pay

## 2016-11-06 ENCOUNTER — Other Ambulatory Visit (INDEPENDENT_AMBULATORY_CARE_PROVIDER_SITE_OTHER): Payer: BLUE CROSS/BLUE SHIELD

## 2016-11-06 DIAGNOSIS — Z23 Encounter for immunization: Secondary | ICD-10-CM

## 2016-11-07 LAB — TESTOSTERONE: Testosterone: 384 ng/dL (ref 250–827)

## 2016-11-11 ENCOUNTER — Other Ambulatory Visit: Payer: Self-pay | Admitting: *Deleted

## 2016-12-06 ENCOUNTER — Encounter: Payer: Self-pay | Admitting: Internal Medicine

## 2016-12-07 ENCOUNTER — Ambulatory Visit (INDEPENDENT_AMBULATORY_CARE_PROVIDER_SITE_OTHER): Payer: BLUE CROSS/BLUE SHIELD | Admitting: Physician Assistant

## 2016-12-07 ENCOUNTER — Other Ambulatory Visit: Payer: Self-pay | Admitting: Internal Medicine

## 2016-12-07 ENCOUNTER — Encounter: Payer: Self-pay | Admitting: Physician Assistant

## 2016-12-07 VITALS — BP 140/76 | HR 78 | Temp 97.5°F | Resp 16 | Ht 69.5 in | Wt 239.6 lb

## 2016-12-07 DIAGNOSIS — L719 Rosacea, unspecified: Secondary | ICD-10-CM | POA: Diagnosis not present

## 2016-12-07 MED ORDER — METRONIDAZOLE 0.75 % EX CREA: TOPICAL_CREAM | Freq: Two times a day (BID) | CUTANEOUS | 2 refills | Status: DC

## 2016-12-07 NOTE — Progress Notes (Addendum)
Subjective:    Patient ID: George Montgomery, male    DOB: 08-31-1956, 61 y.o.   MRN: [id number]  HPI 61 y.o. WM presents with redness and rash on his face x 3 weeks. No new soap, shaving cream, detergent. Non pruritic erythematous facial rash. Worse with sweating, hot shower. Face feels warm.    Blood pressure 140/76, pulse 78, temperature 97.5 F (36.4 C), resp. rate 16, height 5' 9.5" (1.765 m), weight 239 lb 9.6 oz (108.7 kg), SpO2 97 %.  Medications Current Outpatient Prescriptions on File Prior to Visit  Medication Sig  . albuterol (PROVENTIL HFA;VENTOLIN HFA) 108 (90 BASE) MCG/ACT inhaler Inhale 1 puff into the lungs every 6 (six) hours as needed for wheezing or shortness of breath. prn  . Cholecalciferol (VITAMIN D PO) Take 5,000 Units by mouth 3 (three) times daily.  Marland Kitchen EPINEPHrine 0.3 mg/0.3 mL IJ SOAJ injection Inject 0.3 mg into the muscle once. prn  . fenofibrate micronized (LOFIBRA) 134 MG capsule TAKE ONE CAPSULE BY MOUTH ONCE DAILY BEFORE BREAKFAST FOR BLOOD FATS  . HYDROcodone-acetaminophen (NORCO/VICODIN) 5-325 MG tablet Take 1/2 to 1 tablet every 3 to 4 hours if needed for cough or pain  . ibuprofen (ADVIL,MOTRIN) 600 MG tablet Take 600 mg by mouth every 6 (six) hours as needed. Reported on 10/24/2015  . losartan-hydrochlorothiazide (HYZAAR) 100-12.5 MG tablet TAKE ONE TABLET BY MOUTH ONCE DAILY  . montelukast (SINGULAIR) 10 MG tablet Take 1 tablet (10 mg total) by mouth at bedtime. (Patient taking differently: Take 10 mg by mouth as needed. )  . OVER THE COUNTER MEDICATION Reported on 10/24/2015  . phentermine (ADIPEX-P) 37.5 MG tablet TAKE 1 TABLET EVERY MORNING AND 1/2 TABLET AT 2PM FOR DIETING AND WEIGHT LOSS.  . ranitidine (ZANTAC) 300 MG tablet Take 1 to 2 tablets daily for heartburn & reflux to allow wean and transition from PPI / pantoprazole  . sertraline (ZOLOFT) 50 MG tablet TAKE ONE TABLET BY MOUTH ONCE DAILY  . sildenafil (REVATIO) 20 MG tablet Take 1 to 5 tabs  daily as needed  . traZODone (DESYREL) 150 MG tablet TAKE 1/3 TO 1/2 TO 1 TABLET 1 HOUR BEFORE SLEEP AS NEEDED  . diphenhydrAMINE (BENADRYL) 25 mg capsule Take 1 capsule (25 mg total) by mouth every 8 (eight) hours as needed for itching (rash).  . famotidine (PEPCID) 40 MG tablet Take 1 tablet (40 mg total) by mouth daily.   No current facility-administered medications on file prior to visit.     Problem list He has HTN (hypertension); Cluster headaches; OSA treated with BiPAP; Vitamin D deficiency; Testosterone deficiency; Morbid obesity (BMI 35.85); Prediabetes; Mixed hyperlipidemia; Medication management; BMI 35.73,  adult; and Onychomycosis on his problem list.   Review of Systems  Constitutional: Negative.   HENT: Negative.   Respiratory: Negative.   Cardiovascular: Negative.   Gastrointestinal: Negative.   Genitourinary: Negative.   Musculoskeletal: Negative.   Skin: Positive for rash.  Neurological: Negative.   Psychiatric/Behavioral: Negative.        Objective:   Physical Exam  Constitutional: He is oriented to person, place, and time. He appears well-developed and well-nourished.  HENT:  Head: Normocephalic and atraumatic.  Right Ear: External ear normal.  Left Ear: External ear normal.  Mouth/Throat: Oropharynx is clear and moist.  Eyes: Conjunctivae and EOM are normal. Pupils are equal, round, and reactive to light.  Neck: Normal range of motion. Neck supple.  Cardiovascular: Normal rate, regular rhythm and normal heart sounds.  Pulmonary/Chest: Effort normal and breath sounds normal.  Abdominal: Soft. Bowel sounds are normal.  obese  Musculoskeletal: Normal range of motion.  Neurological: He is alert and oriented to person, place, and time. No cranial nerve deficit.  Skin: Skin is warm and dry.  Face with erythema, some telangiectasias, warmth, no tenderness, no papules, streaking.  Psychiatric: He has a normal mood and affect. His behavior is normal.       Assessment & Plan:  Rosacea - Plan: metroNIDAZOLE (METROCREAM) 0.75 % cream If not better can do oral ABX versus refer Derm Precautions discussed, sun block, avoid wind, sun, hot/extremes   Future Appointments Date Time Provider [place]  01/19/2017 9:00 AM Unk Pinto, MD [facility] None  07/20/2017 10:30 AM Star Age, MD [facility] None

## 2016-12-07 NOTE — Patient Instructions (Signed)
Rosacea Introduction Rosacea is a long-term (chronic) condition that affects the skin of the face, including the cheeks, nose, brow, and chin. This condition can also affect the eyes. Rosacea causes blood vessels near the surface of the skin to enlarge, which results in redness. What are the causes? The cause of this condition is not known. Certain triggers can make rosacea worse, including:  Hot baths.  Exercise.  Sunlight.  Very hot or cold temperatures.  Hot or spicy foods and drinks.  Drinking alcohol.  Stress.  Taking blood pressure medicine.  Long-term use of topical steroids on the face. What increases the risk? This condition is more likely to develop in:  People who are older than 62 years of age.  Women.  People who have light-colored skin (light complexion).  People who have a family history of rosacea. What are the signs or symptoms? Symptoms of this condition include:  Redness of the face.  Red bumps or pimples on the face.  A red, enlarged nose.  Blushing easily.  Red lines on the skin.  Irritated or burning feeling in the eyes.  Swollen eyelids. How is this diagnosed? This condition is diagnosed with a medical history and physical exam. How is this treated? There is no cure for this condition, but treatment can help to control your symptoms. Your health care provider may recommend that you see a skin specialist (dermatologist). Treatment may include:  Antibiotic medicines that are applied to the skin or taken as a pill.  Laser treatment to improve the appearance of the skin.  Surgery. This is rare. Your health care provider will also recommend the best way to take care of your skin. Even after your skin improves, you will likely need to continue treatment to prevent your rosacea from coming back. Follow these instructions at home: Skin Care  Take care of your skin as told by your health care provider. You may be told to do these  things:  Wash your skin gently two or more times each day.  Use mild soap.  Use a sunscreen or sunblock with SPF 30 or greater.  Use gentle cosmetics that are meant for sensitive skin.  Shave with an electric shaver instead of a blade. Lifestyle  Try to keep track of what foods trigger this condition. Avoid any triggers. These may include:  Spicy foods.  Seafood.  Cheese.  Hot liquids.  Nuts.  Chocolate.  Iodized salt.  Do not drink alcohol.  Avoid extremely cold or hot temperatures.  Try to reduce your stress. If you need help, talk with your health care provider.  When you exercise, do these things to stay cool:  Limit your sun exposure.  Use a fan.  Do shorter and more frequent intervals of exercise. General instructions  Keep all follow-up visits as told by your health care provider. This is important.  Take over-the-counter and prescription medicines only as told by your health care provider.  If your eyelids are affected, apply warm compresses to them. Do this as told by your health care provider.  If you were prescribed an antibiotic medicine, apply or take it as told by your health care provider. Do not stop using the antibiotic even if your condition improves. Contact a health care provider if:  Your symptoms get worse.  Your symptoms do not improve after two months of treatment.  You have new symptoms.  You have any changes in vision or you have problems with your eyes, such as redness or itching.  You feel depressed.  You lose your appetite.  You have trouble concentrating. This information is not intended to replace advice given to you by your health care provider. Make sure you discuss any questions you have with your health care provider. Document Released: 11/12/2004 Document Revised: 03/12/2016 Document Reviewed: 12/12/2014  2017 Elsevier

## 2016-12-10 ENCOUNTER — Other Ambulatory Visit: Payer: Self-pay | Admitting: Physician Assistant

## 2016-12-17 ENCOUNTER — Encounter: Payer: Self-pay | Admitting: Internal Medicine

## 2016-12-18 ENCOUNTER — Other Ambulatory Visit: Payer: Self-pay | Admitting: Internal Medicine

## 2016-12-18 MED ORDER — MELOXICAM 15 MG PO TABS: ORAL_TABLET | ORAL | 1 refills | Status: DC

## 2016-12-25 ENCOUNTER — Encounter: Payer: Self-pay | Admitting: Nurse Practitioner

## 2016-12-28 ENCOUNTER — Ambulatory Visit: Payer: Self-pay | Admitting: Nurse Practitioner

## 2016-12-30 ENCOUNTER — Ambulatory Visit (INDEPENDENT_AMBULATORY_CARE_PROVIDER_SITE_OTHER): Payer: BLUE CROSS/BLUE SHIELD | Admitting: Nurse Practitioner

## 2016-12-30 ENCOUNTER — Encounter: Payer: Self-pay | Admitting: Nurse Practitioner

## 2016-12-30 VITALS — BP 130/80 | HR 70 | Ht 69.5 in | Wt 236.8 lb

## 2016-12-30 DIAGNOSIS — E7800 Pure hypercholesterolemia, unspecified: Secondary | ICD-10-CM

## 2016-12-30 DIAGNOSIS — I10 Essential (primary) hypertension: Secondary | ICD-10-CM | POA: Diagnosis not present

## 2016-12-30 DIAGNOSIS — G4733 Obstructive sleep apnea (adult) (pediatric): Secondary | ICD-10-CM | POA: Diagnosis not present

## 2016-12-30 DIAGNOSIS — R0609 Other forms of dyspnea: Secondary | ICD-10-CM

## 2016-12-30 DIAGNOSIS — R0600 Dyspnea, unspecified: Secondary | ICD-10-CM

## 2016-12-30 NOTE — Progress Notes (Signed)
CARDIOLOGY OFFICE NOTE  Date:  12/30/2016    [name] Date of Birth: 1956-03-11 Medical Record #[id number]  PCP:  Alesia Richards, MD  Cardiologist:  Servando Snare     Chief Complaint  Patient presents with  . Hypertension  . Hyperlipidemia    New patient visit - seen for Dr Marlou Porch (DOD)    History of Present Illness: George Montgomery is a 61 y.o. male who presents today for a new patient visit - per patient request. Seen for Dr. Marlou Porch (DOD) but has asked to follow with me. I see his wife.   He has a history of HTN, HLD, OSA, ED and obesity.   Comes in today. Here alone. He was wanting to be evaluated. I see his wife. 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 Multiple CV risk factors noted. Currently taking phentermine for diet/weight loss. Most recent labs noted. FH + for CAD. His father died at 91 with cirrhosis from alcoholism. Mom is alive at at 69 and has had heart failure, CAD with prior bypass. One brother alive with HTN. He is a former smoker - stopped about 4 or 5 years ago - started at the age of 78. He has had no chest pain. He does have dyspnea on exertion. He has been trying to clear a lot that he has - has been cutting wood and stacking wood - notes that he will easily get winded. No real regular exercise program otherwise.  He is trying to work on his weight - has done Weight Watcher's in the past - but gained his weight back when stopped following. He is taking phentermine. He has had some indigestion - especially at night. He is on Zantac. BP has been well controlled. No palpitations.   Past Medical History:  Diagnosis Date  . Arthritis   . Environmental allergies   . OSA on CPAP 08/01/2014  . Reflux     Past Surgical History:  Procedure Laterality Date  . APPENDECTOMY  07/2012  . HEMORRHOID SURGERY  09/2012     Medications: Current Outpatient Prescriptions  Medication Sig Dispense Refill  . albuterol (PROVENTIL  HFA;VENTOLIN HFA) 108 (90 BASE) MCG/ACT inhaler Inhale 1 puff into the lungs every 6 (six) hours as needed for wheezing or shortness of breath. prn 18 g PRN  . Cholecalciferol (VITAMIN D PO) Take 5,000 Units by mouth 3 (three) times daily.    . diphenhydrAMINE (BENADRYL) 25 mg capsule Take 1 capsule (25 mg total) by mouth every 8 (eight) hours as needed for itching (rash). 10 capsule 0  . EPINEPHrine 0.3 mg/0.3 mL IJ SOAJ injection Inject 0.3 mg into the muscle once. prn    . fenofibrate micronized (LOFIBRA) 134 MG capsule TAKE ONE CAPSULES BY MOUTH ONCE DAILY BEFORE BREAKFAST FOR BLOOD FATS 90 capsule 0  . HYDROcodone-acetaminophen (NORCO/VICODIN) 5-325 MG tablet Take 1/2 to 1 tablet every 3 to 4 hours if needed for cough or pain 30 tablet 0  . ibuprofen (ADVIL,MOTRIN) 600 MG tablet Take 600 mg by mouth every 6 (six) hours as needed. Reported on 10/24/2015    . losartan-hydrochlorothiazide (HYZAAR) 100-12.5 MG tablet TAKE ONE TABLET BY MOUTH ONCE DAILY 90 tablet 1  . meloxicam (MOBIC) 15 MG tablet Take 1/2 to 1 tablet daily with food for pain & inflammation 90 tablet 1  . metroNIDAZOLE (METROCREAM) 0.75 % cream Apply topically 2 (two) times daily. 60 g 2  . montelukast (SINGULAIR) 10 MG tablet Take  10 mg by mouth as needed (for seasonal allergies).    Marland Kitchen OVER THE COUNTER MEDICATION Reported on 10/24/2015    . phentermine (ADIPEX-P) 37.5 MG tablet TAKE 1 TABLET EVERY MORNING AND 1/2 TABLET AT 2PM FOR DIETING AND WEIGHT LOSS. 45 tablet 5  . ranitidine (ZANTAC) 300 MG tablet Take 1 to 2 tablets daily for heartburn & reflux to allow wean and transition from PPI / pantoprazole 180 tablet 3  . sertraline (ZOLOFT) 50 MG tablet TAKE ONE TABLET BY MOUTH ONCE DAILY 90 tablet 1  . sildenafil (REVATIO) 20 MG tablet Take 1 to 5 tabs daily as needed 90 tablet 99  . traZODone (DESYREL) 150 MG tablet TAKE 1/3 TO 1/2 TO 1 TABLET 1 HOUR BEFORE SLEEP AS NEEDED  99  . famotidine (PEPCID) 40 MG tablet Take 1 tablet (40 mg  total) by mouth daily. 2 tablet 0   No current facility-administered medications for this visit.     Allergies: Allergies  Allergen Reactions  . Bee Venom Anaphylaxis  . Lisinopril Other (See Comments)    Patient intolerant of drug which caused his tongue to tingle.    Social History: The patient  reports that he quit smoking about 4 years ago. He has never used smokeless tobacco. He reports that he drinks about 1.2 oz of alcohol per week . He reports that he does not use drugs.   Family History: The patient's family history includes Cancer - Colon in his other and paternal uncle; Cancer - Prostate in his other and paternal uncle; Heart disease in his mother; Macular degeneration in his mother; Stroke in his maternal aunt.   Review of Systems: Please see the history of present illness.   Otherwise, the review of systems is positive for none.   All other systems are reviewed and negative.   Physical Exam: VS:  BP 130/80   Pulse 70   Ht 5' 9.5" (1.765 m)   Wt 236 lb 12.8 oz (107.4 kg)   BMI 34.47 kg/m  .  BMI Body mass index is 34.47 kg/m.  Wt Readings from Last 3 Encounters:  12/30/16 236 lb 12.8 oz (107.4 kg)  12/07/16 239 lb 9.6 oz (108.7 kg)  10/21/16 245 lb (111.1 kg)    General: Pleasant. He is obese. Has lost 9 pounds over past 2 months. Alert and in no acute distress.  Face is ruddy. HEENT: Normal.  Neck: Supple, no JVD, carotid bruits, or masses noted.  Cardiac: Regular rate and rhythm. No murmurs, rubs, or gallops. No edema.  Respiratory:  Lungs are clear to auscultation bilaterally with normal work of breathing.  GI: Soft and nontender.  MS: No deformity or atrophy. Gait and ROM intact.  Skin: Warm and dry. Color is normal.  Neuro:  Strength and sensation are intact and no gross focal deficits noted.  Psych: Alert, appropriate and with normal affect.   LABORATORY DATA:  EKG:  EKG is ordered today. This demonstrates NSR.  Lab Results  Component Value Date    WBC 6.6 10/21/2016   HGB 15.1 10/21/2016   HCT 43.9 10/21/2016   PLT 217 10/21/2016   GLUCOSE 97 10/21/2016   CHOL 157 10/21/2016   TRIG 232 (H) 10/21/2016   HDL 34 (L) 10/21/2016   LDLCALC 77 10/21/2016   ALT 32 10/21/2016   AST 23 10/21/2016   NA 139 10/21/2016   K 4.0 10/21/2016   CL 102 10/21/2016   CREATININE 1.29 (H) 10/21/2016   BUN 17 10/21/2016  CO2 27 10/21/2016   TSH 1.43 10/21/2016   PSA 0.54 12/27/2015   HGBA1C 5.5 10/21/2016   MICROALBUR 0.5 12/27/2015    BNP (last 3 results) No results for input(s): BNP in the last 8760 hours.  ProBNP (last 3 results) No results for input(s): PROBNP in the last 8760 hours.   Other Studies Reviewed Today:   Assessment/Plan:  1. HTN - BP is well controlled with his current regimen.   2. HLD - on therapy. Suspect his diet plays a big role.   3. OSA - on CPAP.   4. Obesity - discussed at length - my tips shared with him. I think a regular exercise program is imperative.   5. DOE - most likely multifactorial - I suspect from his weight, prior smoking, probably some degree of diastolic dysfunction. Will get an echo and arrange for GXT in light of his multiple CV risk factors.   Current medicines are reviewed with the patient today.  The patient does not have concerns regarding medicines other than what has been noted above.  The following changes have been made:  See above.  Labs/ tests ordered today include:    Orders Placed This Encounter  Procedures  . EXERCISE TOLERANCE TEST  . EKG 12-Lead  . ECHOCARDIOGRAM COMPLETE     Disposition:   Further disposition pending.   Patient is agreeable to this plan and will call if any problems develop in the interim.   SignedTruitt Merle, NP  12/30/2016 2:22 PM  [address] [place], [place]  [address] Phone: [phone] Fax: [phone]

## 2016-12-30 NOTE — Patient Instructions (Addendum)
We will be checking the following labs today - NONE   Medication Instructions:    Continue with your current medicines.     Testing/Procedures To Be Arranged:  GXT  Echocardiogram  Follow-Up:   We will see how your tests turn out and then decide about follow up.     Other Special Instructions:   Here are my tips to lose weight:  1. Drink only water. You do not need milk, juice, tea, soda or diet soda.  2. Do not eat anything "white". This includes white bread, potatoes, rice or mayo  3. Stay away from fried foods and sweets  4. Your portion should be the size of the palm of your hand.  5. Know what your weaknesses are and avoid.   6. Find an exercise you like and do it every day for 45 to 60 minutes.         Call the [place] office at [phone] if you have any questions, problems or concerns.

## 2017-01-06 ENCOUNTER — Other Ambulatory Visit: Payer: Self-pay | Admitting: *Deleted

## 2017-01-06 MED ORDER — PHENTERMINE HCL 37.5 MG PO TABS
ORAL_TABLET | ORAL | 5 refills | Status: DC
Start: 2017-01-06 — End: 2018-02-22

## 2017-01-14 ENCOUNTER — Other Ambulatory Visit (HOSPITAL_COMMUNITY): Payer: BLUE CROSS/BLUE SHIELD

## 2017-01-19 ENCOUNTER — Ambulatory Visit (INDEPENDENT_AMBULATORY_CARE_PROVIDER_SITE_OTHER): Payer: BLUE CROSS/BLUE SHIELD

## 2017-01-19 ENCOUNTER — Encounter: Payer: Self-pay | Admitting: Internal Medicine

## 2017-01-19 DIAGNOSIS — R0609 Other forms of dyspnea: Secondary | ICD-10-CM | POA: Diagnosis not present

## 2017-01-19 DIAGNOSIS — E7800 Pure hypercholesterolemia, unspecified: Secondary | ICD-10-CM

## 2017-01-19 DIAGNOSIS — R0600 Dyspnea, unspecified: Secondary | ICD-10-CM

## 2017-01-19 DIAGNOSIS — I10 Essential (primary) hypertension: Secondary | ICD-10-CM | POA: Diagnosis not present

## 2017-01-19 LAB — EXERCISE TOLERANCE TEST
Estimated workload: 11.7 METS
Exercise duration (min): 10 min
Exercise duration (sec): 0 s
MPHR: 158 {beats}/min
Peak HR: 141 {beats}/min
Percent HR: 89 %
RPE: 17
Rest HR: 68 {beats}/min

## 2017-01-20 ENCOUNTER — Other Ambulatory Visit: Payer: Self-pay | Admitting: *Deleted

## 2017-01-20 DIAGNOSIS — I10 Essential (primary) hypertension: Secondary | ICD-10-CM

## 2017-01-20 MED ORDER — LOSARTAN POTASSIUM-HCTZ 100-25 MG PO TABS: 1.0000 | ORAL_TABLET | Freq: Every day | ORAL | 9 refills | Status: DC

## 2017-01-25 ENCOUNTER — Telehealth: Payer: Self-pay | Admitting: Nurse Practitioner

## 2017-01-25 NOTE — Telephone Encounter (Signed)
I don't see any labs in EPIC  Can we get a copy to look at it and then decide?

## 2017-01-25 NOTE — Telephone Encounter (Signed)
New Message     Pt just had Lab work done through his PCP its in My chart, does he need to have the blood work done Thursday 4/12? If not please call and let him know and cancel that appt , thank you

## 2017-01-25 NOTE — Telephone Encounter (Signed)
s/w Katrina at Dr. Gaspar Skeeters office, last lab pt had was in January and pt is not scheduled to have labs anytime  Soon at PCP.

## 2017-01-25 NOTE — Telephone Encounter (Signed)
s/w pt confirming pt needs to come in on Thursday, April 12 for repeat bmet due to adding medication to last ov.

## 2017-01-28 ENCOUNTER — Other Ambulatory Visit (INDEPENDENT_AMBULATORY_CARE_PROVIDER_SITE_OTHER): Payer: BLUE CROSS/BLUE SHIELD

## 2017-01-28 DIAGNOSIS — I10 Essential (primary) hypertension: Secondary | ICD-10-CM

## 2017-01-29 LAB — BASIC METABOLIC PANEL
BUN/Creatinine Ratio: 17 (ref 10–24)
BUN: 15 mg/dL (ref 8–27)
CO2: 25 mmol/L (ref 18–29)
Calcium: 9.2 mg/dL (ref 8.6–10.2)
Chloride: 99 mmol/L (ref 96–106)
Creatinine, Ser: 0.89 mg/dL (ref 0.76–1.27)
GFR calc Af Amer: 106 mL/min/{1.73_m2} (ref >59)
GFR calc non Af Amer: 92 mL/min/{1.73_m2} (ref >59)
Glucose: 99 mg/dL (ref 65–99)
Potassium: 4.2 mmol/L (ref 3.5–5.2)
Sodium: 142 mmol/L (ref 134–144)

## 2017-02-08 ENCOUNTER — Other Ambulatory Visit: Payer: Self-pay | Admitting: Internal Medicine

## 2017-02-08 NOTE — Telephone Encounter (Signed)
Please let him know that the echo was to help work up his complaint of shortness of breath, to look at the thickness of his heart from his HTN as well as if his heart is ok due to diet pills.   I would still be in favor of having the echo - but it is ultimately up to him.

## 2017-02-08 NOTE — Telephone Encounter (Signed)
I spoke with [name] today,patient said he didn't want to reschedule his echocardiogram due to cost of the test.

## 2017-02-08 NOTE — Telephone Encounter (Signed)
Informed the pt of Lori's rationale as too why she ordered the echo.  Pt states he will first go and see his Pulmonologist tomorrow, and follow-up with Lori's CMA based on that visit,  if he wants to proceed with echo or not.  Will forward this information to [facility] CMA for further follow-up with the pt.

## 2017-02-08 NOTE — Telephone Encounter (Signed)
Follow Up:   Please call,pt wants to know if his Echo is necessary at this time?

## 2017-02-08 NOTE — Telephone Encounter (Signed)
Pt calling to ask if his Echo is necessary, for he received a quote from his insurance carrier that it will cost him $1000 out of pocket for this test. Pt would like Macarthur Critchley NP advice if he should proceed with getting this done or not.  Went over [employer] and plan with the pt, as too why she ordered for him to have this done.  Pt wanted me to make Cecille Rubin aware that he will be seeing a Technical brewer at [employer].  Informed the pt that I will route this information to [facility] for further review and recommendation if echo is pertinent or not.   Informed the pt that someone from the office will follow-up with him thereafter, once recommendations are provided.  Pt verbalized understanding and agrees with this plan.        Assessment/Plan:  1. HTN - BP is well controlled with his current regimen.   2. HLD - on therapy. Suspect his diet plays a big role.   3. OSA - on CPAP.   4. Obesity - discussed at length - my tips shared with him. I think a regular exercise program is imperative.   5. DOE - most likely multifactorial - I suspect from his weight, prior smoking, probably some degree of diastolic dysfunction. Will get an echo and arrange for GXT in light of his multiple CV risk factors.   Current medicines are reviewed with the patient today.  The patient does not have concerns regarding medicines other than what has been noted above.  The following changes have been made:  See above.  Labs/ tests ordered today include:

## 2017-02-09 ENCOUNTER — Encounter: Payer: Self-pay | Admitting: Pulmonary Disease

## 2017-02-09 ENCOUNTER — Ambulatory Visit (INDEPENDENT_AMBULATORY_CARE_PROVIDER_SITE_OTHER): Payer: BLUE CROSS/BLUE SHIELD | Admitting: Pulmonary Disease

## 2017-02-09 VITALS — BP 118/70 | HR 72 | Ht 69.5 in | Wt 237.0 lb

## 2017-02-09 DIAGNOSIS — R0602 Shortness of breath: Secondary | ICD-10-CM | POA: Diagnosis not present

## 2017-02-09 DIAGNOSIS — F17200 Nicotine dependence, unspecified, uncomplicated: Secondary | ICD-10-CM | POA: Diagnosis not present

## 2017-02-09 DIAGNOSIS — R0600 Dyspnea, unspecified: Secondary | ICD-10-CM | POA: Diagnosis not present

## 2017-02-09 NOTE — Patient Instructions (Addendum)
Breathing test showed mild restriction -no evidence of COPD  We will refer you to lung cancer screening program

## 2017-02-09 NOTE — Assessment & Plan Note (Signed)
He does not seem to have significant airway obstruction on spirometry. He had a recent negative treadmill cardiac stress test which puts him at low risk for cardiac events. He is trying to improve his conditioning by walking 3 miles on a treadmill daily. I feel his symptoms may be related to deconditioning. Certainly do not think that he needs bronchodilators. He has no risk factors for venous thrombus embolism Due to his long history of smoking I discussed lung cancer screening with him. He was agreeable to undergo a screening CT scan of the chest. We will refer him to her lung cancer screening program Further follow-up will be based on the results of this CT scan

## 2017-02-09 NOTE — Progress Notes (Signed)
Subjective:    Patient ID: George Montgomery, male    DOB: [birth year], 61 y.o.   MRN: [id number]  HPI  Chief Complaint  Patient presents with  . Pulm Consult    Self referral for SOB. States this happens after lifting heavy objects and working out. Works out on a treadmill, 3 miles a day.  Denies having a cough.    61 year old heavy ex-smoker presents for evaluation of dyspnea. He reports dyspnea on lifting heavy objects and sometimes even when he bends down to tie his shoe. This lasts for a few seconds to minutes and then dissipates spontaneously. Surprisingly he can walk 3 miles on his treadmill from speeds off 2.6-4.2 miles per hour. He reports an occasional wheeze but denies frequent chest colds. He denies chest pain, orthopnea, pedal edema or paroxysmal nocturnal dyspnea or In fact he had cardiac stress testing done on the treadmill which was low risk. He had a hypertensive response and his blood pressure medication was adjusted. He smoked about a pack per day starting at age 86, more than 40 pack years until he quit in 2013. He is trying a diet and exercise program and is trying real hard to lose weight. About 10 years ago he had a pneumonia and was told that he has a spot on his lung and maybe some scarring no x-rays are available for me to review. 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 He has obstructive sleep apnea and is compliant with his CPAP machine this is being managed by [facility] neurology-  Spirometry showed mild restriction, ratio of 80, FEV1 of 75% FVC of 71%   Past Medical History:  Diagnosis Date  . Arthritis   . Environmental allergies   . OSA on CPAP 08/01/2014  . Reflux     Past Surgical History:  Procedure Laterality Date  . APPENDECTOMY  07/2012  . HEMORRHOID SURGERY  09/2012    Allergies  Allergen Reactions  . Bee Venom Anaphylaxis  . Meloxicam Anaphylaxis and Other (See Comments)    Also felt dizzy after taking medication.   .  Lisinopril Other (See Comments)    Patient intolerant of drug which caused his tongue to tingle.     Social History   Social History  . Marital status: Married    Spouse name: [name]  . Number of children: 4  . Years of education: college   Occupational History  .      [employer]   Social History Main Topics  . Smoking status: Former Smoker    Quit date: 12/03/2012  . Smokeless tobacco: Never Used  . Alcohol use 1.2 oz/week    2 Standard drinks or equivalent per week     Comment: occ  . Drug use: No  . Sexual activity: Not on file   Other Topics Concern  . Not on file   Social History Narrative   Right handed, caffeine 3-4 cups daily, Married, 4 kids, 9 g kids., FT insurance agent (self employed). 4 yrs college    Family History  Problem Relation Age of Onset  . Heart disease Mother   . Macular degeneration Mother   . Cancer - Colon Other   . Cancer - Prostate Other   . Stroke Maternal Aunt   . Cancer - Colon Paternal Uncle   . Cancer - Prostate Paternal Uncle      Review of Systems Positive for shortness of breath with activity, hasn't heartburn, indigestion,  tooth problems, nasal congestion, sneezing, stiffness of hand in the mornings  Constitutional: negative for anorexia, fevers and sweats  Eyes: negative for irritation, redness and visual disturbance  Ears, nose, mouth, throat, and face: negative for earaches, epistaxis, nasal congestion and sore throat  Respiratory: negative for cough, dyspnea on exertion, sputum and wheezing  Cardiovascular: negative for chest pain, dyspnea, lower extremity edema, orthopnea, palpitations and syncope  Gastrointestinal: negative for abdominal pain, constipation, diarrhea, melena, nausea and vomiting  Genitourinary:negative for dysuria, frequency and hematuria  Hematologic/lymphatic: negative for bleeding, easy bruising and lymphadenopathy  Musculoskeletal:negative for arthralgias, muscle weakness and stiff  joints  Neurological: negative for coordination problems, gait problems, headaches and weakness  Endocrine: negative for diabetic symptoms including polydipsia, polyuria and weight loss     Objective:   Physical Exam  Gen. Pleasant, obese, in no distress, normal affect ENT - no lesions, no post nasal drip, class 2-3 airway Neck: No JVD, no thyromegaly, no carotid bruits Lungs: no use of accessory muscles, no dullness to percussion, decreased without rales or rhonchi  Cardiovascular: Rhythm regular, heart sounds  normal, no murmurs or gallops, no peripheral edema Abdomen: soft and non-tender, no hepatosplenomegaly, BS normal. Musculoskeletal: No deformities, no cyanosis or clubbing Neuro:  alert, non focal, no tremors       Assessment & Plan:

## 2017-02-19 ENCOUNTER — Telehealth: Payer: Self-pay | Admitting: *Deleted

## 2017-02-19 MED ORDER — AZITHROMYCIN 250 MG PO TABS: ORAL_TABLET | ORAL | 0 refills | Status: DC

## 2017-02-19 MED ORDER — PREDNISONE 10 MG PO TABS
ORAL_TABLET | ORAL | 0 refills | Status: DC
Start: 2017-02-19 — End: 2017-04-27

## 2017-02-19 NOTE — Telephone Encounter (Signed)
Patient called and reported he has sinus drainage with a sore throat and cough.  Per Dr Melford Aase, send in an RX for a Z-pak and Prednisone 10 mg.  The patient's spouse is aware.

## 2017-02-21 NOTE — Patient Instructions (Signed)

## 2017-02-21 NOTE — Progress Notes (Signed)
[facility]   Unk Pinto, M.D.      Uvaldo Bristle. Silverio Lay, P.A.-C [facility]                [address] [place] [address] Telephone [phone] Telefax [phone] Annual  Screening/Preventative Visit  & Comprehensive Evaluation & Examination     This very nice 61 y.o. MWM presents for a Screening/Preventative Visit & comprehensive evaluation and management of multiple medical co-morbidities.  Patient has been followed for HTN, Prediabetes, Hyperlipidemia, Testosterone  and Vitamin D Deficiency. Patient also has GERD controlled on bid Pepcid. Recently he had w/u by Dr Elsworth Soho for dyspnea with Nl spirometry and a stress test with etiology of his dyspnea attributed to deconditioning. Patient also has GERD controlled with pru    HTN predates since Dec 2015 . Patient's BP has been controlled at home.  Today's BP is borderline elevated - 140/72. Patient denies any cardiac symptoms as chest pain, palpitations, shortness of breath, dizziness or ankle swelling.     Patient's hyperlipidemia is controlled with diet and medications. Patient denies myalgias or other medication SE's. Last lipids were at goal albeit elevated Trig's: Lab Results  Component Value Date   CHOL 157 10/21/2016   HDL 34 (L) 10/21/2016   LDLCALC 77 10/21/2016   TRIG 232 (H) 10/21/2016   CHOLHDL 4.6 10/21/2016      Patient has prediabetes/insulin resistance with A1c 5.6% and elevated insulin 29 in in Jan 2016.  Patient denies reactive hypoglycemic symptoms, visual blurring, diabetic polys or paresthesias. Last A1c was at goal: Lab Results  Component Value Date   HGBA1C 5.5 10/21/2016       Finally, patient has history of Vitamin D Deficiency ("21" in Jan 2016) and last vitamin D was at goal: Lab Results  Component Value Date   VD25OH 60 10/21/2016   Current Outpatient Prescriptions on File Prior to Visit  Medication Sig  .  albuterol  HFA  inhaler 1 puff  every 6 hrs as needed  . VITAMIN D 5,000 Units Take  3 x daily.  Marland Kitchen EPINEPHrine 0.3 m inj Inject 0.3 mg into the muscle once. prn  . fenofibrate   134 MG  TAKE ONE CAPSULES BY MOUTH ONCE DAILY BEFORE BREAKFAST FOR BLOOD FATS  . VICODIN 5-325 MG  1/2-1 tab every 3 to 4 hrs if needed for cough or pain  . losartan-hCTZ 100-25  Take 1 tablet by mouth daily.  Marland Kitchen METROCREAM 0.75 % crm Apply topically 2 x daily.  . montelukast  10 MG  Take 10 mg by mouth as needed   . phentermine  37.5 MG  TAKE 1 TAB QAM AND 1/2 TAB AT 2PM   . ranitidine  300 MG  Take 1 to 2 tab daily for heartburn & reflux   . sertraline  50 MG  TAKE ONE TAB  ONCE DAILY  . BENADRYL 25 mg  Take 1 cap every 8  hrs as needed   . famotidine  40 MG tablet Take 1 tab daily.   Allergies  Allergen Reactions  . Bee Venom Anaphylaxis  . Meloxicam Anaphylaxis and Other (See Comments)    Also felt dizzy after taking medication.   . Lisinopril Other (See Comments)    Patient intolerant of drug which caused his tongue to tingle.   Past Medical History:  Diagnosis Date  .  Arthritis   . Environmental allergies   . OSA on CPAP 08/01/2014  . Reflux    Health Maintenance  Topic Date Due  . Hepatitis C Screening  26-Sep-1956  . HIV Screening  01/18/1970  . INFLUENZA VACCINE  05/19/2017  . COLONOSCOPY  02/12/2025  . TETANUS/TDAP  11/06/2026   Immunization History  Administered Date(s) Administered  . Influenza Split 08/06/2015  . Influenza,inj,quad, With Preservative 08/20/2016  . Pneumococcal Polysaccharide-23 08/20/2016  . Tdap 11/06/2016  . Zoster 11/06/2016   Past Surgical History:  Procedure Laterality Date  . APPENDECTOMY  07/2012  . HEMORRHOID SURGERY  09/2012   Family History  Problem Relation Age of Onset  . Heart disease Mother   . Macular degeneration Mother   . Cancer - Colon Other   . Cancer - Prostate Other   . Stroke Maternal Aunt   . Cancer - Colon Paternal Uncle   . Cancer  - Prostate Paternal Uncle    Social History   Social History  . Marital status: Married    Spouse name: [name]  . Number of children: 4  . Years of education: college   Occupational History  .      [employer]   Social History Main Topics  . Smoking status: Former Smoker    Quit date: 12/03/2012  . Smokeless tobacco: Never Used  . Alcohol use 1.2 oz/week    2 Standard drinks or equivalent per week     Comment: occ  . Drug use: No  . 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 Does c/o fatigue. Eyes: Denies redness, blurred vision, diplopia, discharge, itchy or watery eyes.  ENT: Denies discharge, congestion, post nasal drip, epistaxis, sore throat, earache, hearing loss, dental pain, Tinnitus, Vertigo, Sinus pain or snoring.  Cardio: Denies chest pain, palpitations, irregular heartbeat, syncope, dyspnea, diaphoresis, orthopnea, PND, claudication or edema Respiratory: denies cough, dyspnea, DOE, pleurisy, hoarseness, laryngitis or wheezing.  Gastrointestinal: Denies dysphagia, heartburn, reflux, water brash, pain, cramps, nausea, vomiting, bloating, diarrhea, constipation, hematemesis, melena, hematochezia, jaundice or hemorrhoids Genitourinary: Denies dysuria, frequency, urgency, nocturia, hesitancy, discharge, hematuria or flank pain Musculoskeletal: Denies arthralgia, myalgia, stiffness, Jt. Swelling, pain, limp or strain/sprain. Denies Falls. Skin: Denies puritis, rash, hives, warts, acne, eczema or change in skin lesion Neuro: No weakness, tremor, incoordination, spasms, paresthesia or pain Psychiatric: Denies confusion, memory loss or sensory loss. Denies Depression. Endocrine: Denies change in weight, skin, hair  change, nocturia, and paresthesia, diabetic polys, visual blurring or hyper / hypo glycemic episodes.  Heme/Lymph: No excessive bleeding, bruising or enlarged lymph nodes.  Physical Exam  BP 140/72   Pulse 64   Temp 97.2 F (36.2 C)   Resp 16   Ht 5\' 9"  (1.753 m)   Wt 239 lb 6.4 oz (108.6 kg)   BMI 35.35 kg/m   General Appearance: Well nourished and well groomed and in no apparent distress.  Eyes: PERRLA, EOMs, conjunctiva no swelling or erythema, normal fundi and vessels. Sinuses: No frontal/maxillary tenderness ENT/Mouth: EACs patent / TMs  nl. Nares clear without erythema, swelling, mucoid exudates. Oral hygiene is good. No erythema, swelling, or exudate. Tongue normal, non-obstructing. Tonsils not swollen or erythematous. Hearing normal.  Neck: Supple, thyroid normal. No bruits, nodes  or JVD. Respiratory: Respiratory effort normal.  BS equal and clear bilateral without rales, rhonci, wheezing or stridor. Cardio: Heart sounds are normal with regular rate and rhythm and no murmurs, rubs or gallops. Peripheral pulses are normal and equal bilaterally without edema. No aortic or femoral bruits. Chest: symmetric with normal excursions and percussion.  Abdomen: Soft, with Nl bowel sounds. Nontender, no guarding, rebound, hernias, masses, or organomegaly.  Lymphatics: Non tender without lymphadenopathy.  Genitourinary: No hernias.Testes nl. DRE - prostate nl for age - smooth & firm w/o nodules. Musculoskeletal: Full ROM all peripheral extremities, joint stability, 5/5 strength, and normal gait. Skin: Warm and dry without rashes, lesions, cyanosis, clubbing or  ecchymosis.  Neuro: Cranial nerves intact, reflexes equal bilaterally. Normal muscle tone, no cerebellar symptoms. Sensation intact.  Pysch: Alert and oriented X 3 with normal affect, insight and judgment appropriate.   Assessment and Plan  1. Annual Preventative/Screening Exam   2. Essential hypertension  - [place], RETROPERITNL  ABD,  LTD - Urinalysis, Routine w reflex microscopic - Microalbumin / creatinine urine ratio - CBC with Differential/Platelet - BASIC METABOLIC PANEL WITH GFR - Magnesium - TSH  3. Hyperlipidemia, mixed  - [place], RETROPERITNL ABD,  LTD - Hepatic function panel - Lipid panel - TSH  4. Prediabetes  - [place], RETROPERITNL ABD,  LTD - Hemoglobin A1c - Insulin, random  5. Vitamin D deficiency  - VITAMIN D 25 Hydroxy  6. Testosterone deficiency  - Testosterone  7. Gastroesophageal reflux disease  8. Screening for rectal cancer  - POC Hemoccult Bld/Stl  9. Prostate cancer screening  - PSA  10. Screening for ischemic heart disease  - Lipid panel  11. Screening for AAA (aortic abdominal aneurysm)  - [place], RETROPERITNL ABD,  LTD  12. Fatigue, unspecified type  - Vitamin B12 - Iron and TIBC - CBC with Differential/Platelet  13. Medication management  - Urinalysis, Routine w reflex microscopic - Microalbumin / creatinine urine ratio - CBC with Differential/Platelet - BASIC METABOLIC PANEL WITH GFR - Hepatic function panel - Magnesium - Lipid panel - TSH - Hemoglobin A1c - Insulin, random - VITAMIN D 25 Hydroxy       Patient was counseled in prudent diet, weight control to achieve/maintain BMI less than 25, BP monitoring, regular exercise and medications as discussed.  Discussed med effects and SE's. Routine screening labs and tests as requested with regular follow-up as recommended. Over 40 minutes of exam, counseling, chart review and high complex critical decision making was performed

## 2017-02-22 ENCOUNTER — Ambulatory Visit (INDEPENDENT_AMBULATORY_CARE_PROVIDER_SITE_OTHER): Payer: BLUE CROSS/BLUE SHIELD | Admitting: Internal Medicine

## 2017-02-22 ENCOUNTER — Encounter: Payer: Self-pay | Admitting: Internal Medicine

## 2017-02-22 VITALS — BP 140/72 | HR 64 | Temp 97.2°F | Resp 16 | Ht 69.0 in | Wt 239.4 lb

## 2017-02-22 DIAGNOSIS — R7303 Prediabetes: Secondary | ICD-10-CM

## 2017-02-22 DIAGNOSIS — E559 Vitamin D deficiency, unspecified: Secondary | ICD-10-CM | POA: Diagnosis not present

## 2017-02-22 DIAGNOSIS — R5383 Other fatigue: Secondary | ICD-10-CM

## 2017-02-22 DIAGNOSIS — I10 Essential (primary) hypertension: Secondary | ICD-10-CM

## 2017-02-22 DIAGNOSIS — Z79899 Other long term (current) drug therapy: Secondary | ICD-10-CM

## 2017-02-22 DIAGNOSIS — Z0001 Encounter for general adult medical examination with abnormal findings: Secondary | ICD-10-CM

## 2017-02-22 DIAGNOSIS — K219 Gastro-esophageal reflux disease without esophagitis: Secondary | ICD-10-CM

## 2017-02-22 DIAGNOSIS — Z125 Encounter for screening for malignant neoplasm of prostate: Secondary | ICD-10-CM | POA: Diagnosis not present

## 2017-02-22 DIAGNOSIS — E349 Endocrine disorder, unspecified: Secondary | ICD-10-CM

## 2017-02-22 DIAGNOSIS — Z0000 Encounter for general adult medical examination without abnormal findings: Secondary | ICD-10-CM | POA: Diagnosis not present

## 2017-02-22 DIAGNOSIS — Z1212 Encounter for screening for malignant neoplasm of rectum: Secondary | ICD-10-CM

## 2017-02-22 DIAGNOSIS — Z136 Encounter for screening for cardiovascular disorders: Secondary | ICD-10-CM

## 2017-02-22 DIAGNOSIS — E782 Mixed hyperlipidemia: Secondary | ICD-10-CM

## 2017-02-22 LAB — CBC WITH DIFFERENTIAL/PLATELET
BASOS PCT: 0 %
Basophils Absolute: 0 cells/uL (ref 0–200)
Eosinophils Absolute: 71 cells/uL (ref 15–500)
Eosinophils Relative: 1 %
HEMATOCRIT: 42.6 % (ref 38.5–50.0)
Hemoglobin: 14.3 g/dL (ref 13.2–17.1)
LYMPHS ABS: 1775 {cells}/uL (ref 850–3900)
LYMPHS PCT: 25 %
MCH: 31 pg (ref 27.0–33.0)
MCHC: 33.6 g/dL (ref 32.0–36.0)
MCV: 92.4 fL (ref 80.0–100.0)
MPV: 10.1 fL (ref 7.5–12.5)
Monocytes Absolute: 710 cells/uL (ref 200–950)
Monocytes Relative: 10 %
NEUTROS ABS: 4544 {cells}/uL (ref 1500–7800)
Neutrophils Relative %: 64 %
PLATELETS: 226 10*3/uL (ref 140–400)
RBC: 4.61 MIL/uL (ref 4.20–5.80)
RDW: 13.4 % (ref 11.0–15.0)
WBC: 7.1 10*3/uL (ref 3.8–10.8)

## 2017-02-22 LAB — PSA: PSA: 0.9 ng/mL (ref <=4.0)

## 2017-02-22 LAB — TSH: TSH: 2.78 mIU/L (ref 0.40–4.50)

## 2017-02-22 LAB — VITAMIN B12: Vitamin B-12: 764 pg/mL (ref 200–1100)

## 2017-02-23 LAB — BASIC METABOLIC PANEL WITH GFR
BUN: 17 mg/dL (ref 7–25)
CALCIUM: 9.5 mg/dL (ref 8.6–10.3)
CO2: 25 mmol/L (ref 20–31)
Chloride: 103 mmol/L (ref 98–110)
Creat: 0.92 mg/dL (ref 0.70–1.25)
GFR, EST NON AFRICAN AMERICAN: 89 mL/min (ref >=60)
GFR, Est African American: >89 mL/min (ref >=60)
Glucose, Bld: 92 mg/dL (ref 65–99)
POTASSIUM: 4 mmol/L (ref 3.5–5.3)
SODIUM: 142 mmol/L (ref 135–146)

## 2017-02-23 LAB — LIPID PANEL
CHOL/HDL RATIO: 4.5 ratio (ref <5.0)
CHOLESTEROL: 148 mg/dL (ref <200)
HDL: 33 mg/dL — ABNORMAL LOW (ref >40)
LDL Cholesterol: 80 mg/dL (ref <100)
TRIGLYCERIDES: 173 mg/dL — AB (ref <150)
VLDL: 35 mg/dL — AB (ref <30)

## 2017-02-23 LAB — URINALYSIS, ROUTINE W REFLEX MICROSCOPIC
BILIRUBIN URINE: NEGATIVE
GLUCOSE, UA: NEGATIVE
Hgb urine dipstick: NEGATIVE
Ketones, ur: NEGATIVE
LEUKOCYTES UA: NEGATIVE
Nitrite: NEGATIVE
PH: 7.5 (ref 5.0–8.0)
Protein, ur: NEGATIVE
Specific Gravity, Urine: 1.014 (ref 1.001–1.035)

## 2017-02-23 LAB — HEPATIC FUNCTION PANEL
ALT: 36 U/L (ref 9–46)
AST: 25 U/L (ref 10–35)
Albumin: 4.4 g/dL (ref 3.6–5.1)
Alkaline Phosphatase: 75 U/L (ref 40–115)
BILIRUBIN DIRECT: 0.1 mg/dL (ref <=0.2)
BILIRUBIN INDIRECT: 0.4 mg/dL (ref 0.2–1.2)
Total Bilirubin: 0.5 mg/dL (ref 0.2–1.2)
Total Protein: 7.2 g/dL (ref 6.1–8.1)

## 2017-02-23 LAB — TESTOSTERONE: Testosterone: 301 ng/dL (ref 250–827)

## 2017-02-23 LAB — IRON AND TIBC
%SAT: 35 % (ref 15–60)
IRON: 118 ug/dL (ref 50–180)
TIBC: 335 ug/dL (ref 250–425)
UIBC: 217 ug/dL (ref 125–400)

## 2017-02-23 LAB — HEMOGLOBIN A1C
Hgb A1c MFr Bld: 5.3 % (ref <5.7)
Mean Plasma Glucose: 105 mg/dL

## 2017-02-23 LAB — MICROALBUMIN / CREATININE URINE RATIO
Creatinine, Urine: 57 mg/dL (ref 20–370)
Microalb, Ur: <0.2 mg/dL

## 2017-02-23 LAB — INSULIN, RANDOM: Insulin: 15 u[IU]/mL (ref 2.0–19.6)

## 2017-02-23 LAB — VITAMIN D 25 HYDROXY (VIT D DEFICIENCY, FRACTURES): Vit D, 25-Hydroxy: 49 ng/mL (ref 30–100)

## 2017-02-23 LAB — MAGNESIUM: Magnesium: 2.1 mg/dL (ref 1.5–2.5)

## 2017-02-24 ENCOUNTER — Other Ambulatory Visit: Payer: Self-pay | Admitting: Physician Assistant

## 2017-02-24 ENCOUNTER — Telehealth: Payer: Self-pay | Admitting: *Deleted

## 2017-02-24 MED ORDER — PROMETHAZINE-DM 6.25-15 MG/5ML PO SYRP: 5.0000 mL | ORAL_SOLUTION | Freq: Four times a day (QID) | ORAL | 0 refills | Status: DC | PRN

## 2017-02-24 MED ORDER — AZITHROMYCIN 250 MG PO TABS: ORAL_TABLET | ORAL | 0 refills | Status: AC

## 2017-02-24 NOTE — Telephone Encounter (Signed)
Patient called and states he still has cough, congestion and sinus drainage.  Per Dr Eddie North, advise the patient to try Mucus Relief 1200 mg OTC and send in an RX for a Z-pak and Phenergan DM.

## 2017-03-01 ENCOUNTER — Telehealth: Payer: Self-pay | Admitting: Acute Care

## 2017-03-01 DIAGNOSIS — Z87891 Personal history of nicotine dependence: Secondary | ICD-10-CM

## 2017-03-02 NOTE — Telephone Encounter (Signed)
Will forward to the lung screening pool 

## 2017-03-02 NOTE — Telephone Encounter (Signed)
Spoke with pt.  He would like to call [employer] to check coverage before scheduling.  Will await call back

## 2017-03-02 NOTE — Telephone Encounter (Signed)
LMTC x 1  

## 2017-03-08 NOTE — Telephone Encounter (Signed)
Spoke with pt and discussed with him that [place] will not cover SDMV and screening CT. PT states he wants to check some things and call back.  Will await pt call.

## 2017-03-08 NOTE — Telephone Encounter (Signed)
Spoke with pt and scheduled for [facility] 03/10/17 12:00 CT ordered through [facility] imaging Nothing further needed

## 2017-03-10 ENCOUNTER — Ambulatory Visit: Payer: BLUE CROSS/BLUE SHIELD

## 2017-03-10 ENCOUNTER — Encounter: Payer: BLUE CROSS/BLUE SHIELD | Admitting: Acute Care

## 2017-03-22 ENCOUNTER — Ambulatory Visit
Admission: RE | Admit: 2017-03-22 | Discharge: 2017-03-22 | Disposition: A | Discharge status: Home / Self Care | Payer: BLUE CROSS/BLUE SHIELD | Source: Ambulatory Visit | Attending: Acute Care | Admitting: Acute Care

## 2017-03-22 ENCOUNTER — Encounter: Payer: Self-pay | Admitting: Acute Care

## 2017-03-22 ENCOUNTER — Ambulatory Visit (INDEPENDENT_AMBULATORY_CARE_PROVIDER_SITE_OTHER): Payer: BLUE CROSS/BLUE SHIELD | Admitting: Acute Care

## 2017-03-22 DIAGNOSIS — Z87891 Personal history of nicotine dependence: Secondary | ICD-10-CM

## 2017-03-22 NOTE — Progress Notes (Signed)
Shared Decision Making Visit Lung Cancer Screening Program [id number])   Eligibility:  Age 61 y.o.  Pack Years Smoking History Calculation 40 pack year smoking history (# packs/per year x # years smoked)  Recent History of coughing up blood  no  Unexplained weight loss? no ( >Than 15 pounds within the last 6 months )  Prior History Lung / other cancer no (Diagnosis within the last 5 years already requiring surveillance chest CT Scans).  Smoking Status Former Smoker  Former Smokers: Years since quit: 5 years  Quit Date: 2013  Visit Components:  Discussion included one or more decision making aids. yes  Discussion included risk/benefits of screening. yes  Discussion included potential follow up diagnostic testing for abnormal scans. yes  Discussion included meaning and risk of over diagnosis. yes  Discussion included meaning and risk of False Positives. yes  Discussion included meaning of total radiation exposure. yes  Counseling Included:  Importance of adherence to annual lung cancer LDCT screening. yes  Impact of comorbidities on ability to participate in the program. yes  Ability and willingness to under diagnostic treatment. yes  Smoking Cessation Counseling:  Current Smokers:   Discussed importance of smoking cessation. yes  Information about tobacco cessation classes and interventions provided to patient. yes  Patient provided with "ticket" for LDCT Scan. yes  Symptomatic Patient. no  Counseling  Diagnosis Code: Tobacco Use Z72.0  Asymptomatic Patient yes  Counseling (Intermediate counseling: > three minutes counseling) Q1194  Former Smokers:   Discussed the importance of maintaining cigarette abstinence. yes  Diagnosis Code: Personal History of Nicotine Dependence. R74.081  Information about tobacco cessation classes and interventions provided to patient. Yes  Patient provided with "ticket" for LDCT Scan. yes  Written Order for Lung Cancer  Screening with LDCT placed in Epic. Yes (CT Chest Lung Cancer Screening Low Dose W/O CM) [id number] Z12.2-Screening of respiratory organs Z87.891-Personal history of nicotine dependence  I spent 25 minutes of face to face time with [name] discussing the risks and benefits of lung cancer screening. We viewed a power point together that explained in detail the above noted topics. We took the time to pause the power point at intervals to allow for questions to be asked and answered to ensure understanding. We discussed that he had taken the single most powerful action possible to decrease his risk of developing lung cancer when he quit smoking. I counseled him to remain smoke free, and to contact me if he ever had the desire to smoke again so that I can provide resources and tools to help support the effort to remain smoke free. We discussed the time and location of the scan, and that either  George Glassman RN or I will call with the results within  24-48 hours of receiving them. [name] has my card and contact information in the event he needs to speak with me, in addition to a copy of the power point we reviewed as a resource. [name]  verbalized understanding of all of the above and had no further questions upon leaving the office.     I explained to the patient that there has been a high incidence of coronary artery disease noted on these exams. I explained that this is a non-gated exam therefore degree or severity cannot be determined. This patient is not on statin therapy. I have asked the patient to follow-up with their PCP regarding any incidental finding of coronary artery disease and management with diet or medication as they  feel is clinically indicated. The patient verbalized understanding of the above and had no further questions.     George Spatz, NP 03/22/2017

## 2017-03-25 ENCOUNTER — Other Ambulatory Visit: Payer: Self-pay | Admitting: Acute Care

## 2017-03-25 DIAGNOSIS — Z87891 Personal history of nicotine dependence: Secondary | ICD-10-CM

## 2017-04-07 ENCOUNTER — Ambulatory Visit (INDEPENDENT_AMBULATORY_CARE_PROVIDER_SITE_OTHER): Payer: BLUE CROSS/BLUE SHIELD

## 2017-04-07 ENCOUNTER — Encounter (INDEPENDENT_AMBULATORY_CARE_PROVIDER_SITE_OTHER): Payer: Self-pay | Admitting: Orthopaedic Surgery

## 2017-04-07 ENCOUNTER — Ambulatory Visit (INDEPENDENT_AMBULATORY_CARE_PROVIDER_SITE_OTHER): Payer: BLUE CROSS/BLUE SHIELD | Admitting: Orthopaedic Surgery

## 2017-04-07 DIAGNOSIS — M79602 Pain in left arm: Secondary | ICD-10-CM | POA: Diagnosis not present

## 2017-04-07 DIAGNOSIS — M79641 Pain in right hand: Secondary | ICD-10-CM | POA: Diagnosis not present

## 2017-04-07 DIAGNOSIS — M65341 Trigger finger, right ring finger: Secondary | ICD-10-CM | POA: Diagnosis not present

## 2017-04-07 DIAGNOSIS — M79642 Pain in left hand: Secondary | ICD-10-CM | POA: Diagnosis not present

## 2017-04-07 MED ORDER — LIDOCAINE HCL 1 % IJ SOLN
1.0000 mL | INTRAMUSCULAR | Status: AC | PRN
  Administered 2017-04-07: 1 mL

## 2017-04-07 MED ORDER — DICLOFENAC SODIUM 1 % TD GEL: 2.0000 g | Freq: Four times a day (QID) | TRANSDERMAL | 3 refills | Status: DC

## 2017-04-07 MED ORDER — METHYLPREDNISOLONE ACETATE 40 MG/ML IJ SUSP
40.0000 mg | INTRAMUSCULAR | Status: AC | PRN
  Administered 2017-04-07: 40 mg

## 2017-04-07 MED ORDER — METHYLPREDNISOLONE 4 MG PO TABS: ORAL_TABLET | ORAL | 0 refills | Status: DC

## 2017-04-07 NOTE — Progress Notes (Signed)
Office Visit Note   Patient: George Montgomery           Date of Birth: 1956/09/11           MRN: [id number] Visit Date: 04/07/2017              Requested by: Unk Pinto, [place] [place], [place] [address] PCP: Unk Pinto, MD   Assessment & Plan: Visit Diagnoses:  1. Bilateral hand pain   2. Left arm pain     Plan: Given the fact that his left humerus mass his groin and quite painful and MRI of the soft tissues warranted to further assess this. I also provided a steroid injection at his right hand A1 pulley. This was of the ring finger. I will send in a prescription for Voltaren gel to try on his hands and other painful areas. I'm also going put him on a six-day steroid taper. We'll see him back after the MRI to further evaluation the left humerus in to see how he is done with his hands. All questions were encouraged and answered.  Follow-Up Instructions: Return in about 2 weeks (around 04/21/2017).   Orders:  Orders Placed This Encounter  Procedures  . Hand/Upper Extremity Injection/Arthrocentesis  . XR Hand Complete Left  . XR Hand Complete Right  . XR Humerus Left  . MR HUMERUS LEFT W CONTRAST   Meds ordered this encounter  Medications  . methylPREDNISolone (MEDROL) 4 MG tablet    Sig: Medrol dose pack. Take as instructed    Dispense:  21 tablet    Refill:  0  . diclofenac sodium (VOLTAREN) 1 % GEL    Sig: Apply 2 g topically 4 (four) times daily.    Dispense:  100 g    Refill:  3      Procedures: Hand/UE Inj Date/Time: 04/07/2017 8:06 PM Performed by: Mcarthur Rossetti Authorized by: Mcarthur Rossetti   Condition: trigger finger   Location:  Ring finger Site:  R ring A1 Medications:  1 mL lidocaine 1 %; 40 mg methylPREDNISolone acetate 40 MG/ML     Clinical Data: No additional findings.   Subjective: Chief Complaint  Patient presents with  . Right Hand - Pain  . Left Hand - Pain  . Left Upper Arm - Pain    The patient is a very pleasant individual and seeing for the first time. I've seen family members before. He has stiffness and pain in both of his hands and he points to the MCP joints of the index and middle fingers of both hands. He also reports triggering of the right dominant hand ring finger. He uses his hands daily and have gotten quite stiff to him. 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 He also points to a painful mass been slowly growing on the lateral aspect of his left humerus he said this is painful to him that wakes him up at night and is becoming quite problematic.  HPI  Review of Systems He denies any headache, chest pain, shortness of breath, fever, chills, nausea, vomiting.  Objective: Vital Signs: There were no vitals taken for this visit.  Physical Exam He is alert and oriented 3 and in no acute distress Ortho Exam Examination of the left femur shows a small palpable mass that is mobile in the soft tissues of the mid lateral humerus. There is no  redness or induration around this area and there was no fluctuance. It is however painful to palpation. Examination of both hands show painful grip strength with pain mainly at the MCP joints of the index and middle finger on both hands. There is active triggering of the right ring finger. Both hands are stiff have painful grips and making fist. Specialty Comments:  No specialty comments available.  Imaging: Xr Hand Complete Left  Result Date: 04/07/2017 3 views of the left hand show mild-to-moderate arthritic changes at the MCP joints of the index and middle fingers.  Xr Hand Complete Right  Result Date: 04/07/2017 3 views of the right hand show mild arthritic changes of the MCP joints of the index and middle fingers.  Xr Humerus Left  Result Date: 04/07/2017 An AP and lateral of the left humerus show no acute findings. I see  no soft tissue or bony irregularities.    PMFS History: Patient Active Problem List   Diagnosis Date Noted  . Dyspnea 02/09/2017  . Prediabetes 08/06/2015  . Mixed hyperlipidemia 08/06/2015  . Medication management 08/06/2015  . BMI 35.73,  adult 08/06/2015  . Onychomycosis 08/06/2015  . HTN (hypertension) 11/06/2014  . Cluster headaches 11/06/2014  . OSA treated with BiPAP 11/06/2014  . Vitamin D deficiency 11/06/2014  . Testosterone deficiency 11/06/2014  . Morbid obesity (BMI 35.85) 11/06/2014   Past Medical History:  Diagnosis Date  . Arthritis   . Environmental allergies   . OSA on CPAP 08/01/2014  . Reflux     Family History  Problem Relation Age of Onset  . Heart disease Mother   . Macular degeneration Mother   . Cancer - Colon Other   . Cancer - Prostate Other   . Stroke Maternal Aunt   . Cancer - Colon Paternal Uncle   . Cancer - Prostate Paternal Uncle     Past Surgical History:  Procedure Laterality Date  . APPENDECTOMY  07/2012  . HEMORRHOID SURGERY  09/2012   Social History   Occupational History  .      [employer]   Social History Main Topics  . Smoking status: Former Smoker    Packs/day: 1.00    Years: 40.00    Quit date: 12/03/2012  . Smokeless tobacco: Never Used  . Alcohol use 1.2 oz/week    2 Standard drinks or equivalent per week     Comment: occ  . Drug use: No  . Sexual activity: Not on file

## 2017-04-20 ENCOUNTER — Other Ambulatory Visit: Payer: Self-pay | Admitting: Internal Medicine

## 2017-04-26 ENCOUNTER — Encounter: Payer: Self-pay | Admitting: Internal Medicine

## 2017-04-26 ENCOUNTER — Emergency Department (HOSPITAL_COMMUNITY)
Admission: EM | Admit: 2017-04-26 | Discharge: 2017-04-26 | Disposition: A | Discharge status: Home / Self Care | Payer: BLUE CROSS/BLUE SHIELD | Attending: Emergency Medicine | Admitting: Emergency Medicine

## 2017-04-26 ENCOUNTER — Emergency Department (HOSPITAL_COMMUNITY): Payer: BLUE CROSS/BLUE SHIELD

## 2017-04-26 ENCOUNTER — Other Ambulatory Visit: Payer: Self-pay | Admitting: Internal Medicine

## 2017-04-26 ENCOUNTER — Ambulatory Visit (INDEPENDENT_AMBULATORY_CARE_PROVIDER_SITE_OTHER): Payer: BLUE CROSS/BLUE SHIELD | Admitting: Orthopaedic Surgery

## 2017-04-26 ENCOUNTER — Encounter (HOSPITAL_COMMUNITY): Payer: Self-pay

## 2017-04-26 DIAGNOSIS — Z5321 Procedure and treatment not carried out due to patient leaving prior to being seen by health care provider: Secondary | ICD-10-CM | POA: Diagnosis not present

## 2017-04-26 DIAGNOSIS — R079 Chest pain, unspecified: Secondary | ICD-10-CM | POA: Diagnosis present

## 2017-04-26 DIAGNOSIS — R110 Nausea: Secondary | ICD-10-CM | POA: Insufficient documentation

## 2017-04-26 LAB — CBC
HEMATOCRIT: 41 % (ref 39.0–52.0)
HEMOGLOBIN: 13.8 g/dL (ref 13.0–17.0)
MCH: 31.3 pg (ref 26.0–34.0)
MCHC: 33.7 g/dL (ref 30.0–36.0)
MCV: 93 fL (ref 78.0–100.0)
Platelets: 160 10*3/uL (ref 150–400)
RBC: 4.41 MIL/uL (ref 4.22–5.81)
RDW: 12.7 % (ref 11.5–15.5)
WBC: 4.7 10*3/uL (ref 4.0–10.5)

## 2017-04-26 LAB — BASIC METABOLIC PANEL
ANION GAP: 9 (ref 5–15)
BUN: 10 mg/dL (ref 6–20)
CO2: 23 mmol/L (ref 22–32)
Calcium: 9 mg/dL (ref 8.9–10.3)
Chloride: 104 mmol/L (ref 101–111)
Creatinine, Ser: 1.08 mg/dL (ref 0.61–1.24)
GFR calc Af Amer: >60 mL/min (ref >60)
GFR calc non Af Amer: >60 mL/min (ref >60)
GLUCOSE: 107 mg/dL — AB (ref 65–99)
POTASSIUM: 3.5 mmol/L (ref 3.5–5.1)
Sodium: 136 mmol/L (ref 135–145)

## 2017-04-26 LAB — I-STAT TROPONIN, ED: Troponin i, poc: 0 ng/mL (ref 0.00–0.08)

## 2017-04-26 NOTE — ED Triage Notes (Signed)
Pt. Was at the dentist and was checking his tooth. The dentist inserted saline into his tooth which immediately caused severe pain.  He finished the procedure.  Pt. Went back to work and developed chest pain radiates into his lt. Arm and back.  He is also having nausea and feels very lightheaded.  Pt.s skins is p/w/d and ECG completed in triage.  Pt. Is alert and oriented X4.

## 2017-04-26 NOTE — ED Notes (Signed)
Pt states that he feels much better and if going to see his doctor in the morning. Pt states he is happy with his care but does not wish to stay.

## 2017-04-27 ENCOUNTER — Encounter: Payer: Self-pay | Admitting: Internal Medicine

## 2017-04-27 ENCOUNTER — Ambulatory Visit (INDEPENDENT_AMBULATORY_CARE_PROVIDER_SITE_OTHER): Payer: BLUE CROSS/BLUE SHIELD | Admitting: Internal Medicine

## 2017-04-27 VITALS — BP 124/62 | HR 84 | Temp 98.2°F | Resp 16 | Ht 69.5 in | Wt 243.6 lb

## 2017-04-27 DIAGNOSIS — K0530 Chronic periodontitis, unspecified: Secondary | ICD-10-CM

## 2017-04-27 MED ORDER — CEPHALEXIN 500 MG PO CAPS: ORAL_CAPSULE | ORAL | 0 refills | Status: AC

## 2017-04-27 MED ORDER — PREDNISONE 20 MG PO TABS: ORAL_TABLET | ORAL | 0 refills | Status: DC

## 2017-04-27 MED ORDER — TRAMADOL HCL 50 MG PO TABS: ORAL_TABLET | ORAL | 0 refills | Status: DC

## 2017-04-27 NOTE — Progress Notes (Signed)
Subjective:    Patient ID: George Montgomery, male    DOB: [birth year], 61 y.o.   MRN: [id number]  HPI  Patient is a nice 61 yo MWM with HTN, Prediabetes, Hyperlipidemia, Testosterone, Vitamin D Deficiency & GERD and who yesterday after a peridentals app't for an exposed root socket/implant post developed post procedure and developed Lt jaw pain and later developed CP and indigestion. He went to the ER & had a Nl EKG, labs & CXR. At the ER his BP 127/65, pulse 122/min and T 99.3.   Medication Sig  . albuterol HFA inhaler I1 puff  every 6  hrs as needed   . VITAMIN D Take 5,000 Units by mouth 3 (three) times daily.  . diclofenac  1 % GEL Apply 2 g topically 4 (four) times daily.  Marland Kitchen EPINEPHrine 0.3 mg inj Inject 0.3 mg into the muscle once. prn  . fenofibrate 134 MG capsule TAKE 1 CAPSULE BY MOUTH FATS  . NORCO 5-325 MG tablet Take 1/2 to 1 tablet every 3 to 4 hours if needed for cough or pain  . losartan-hctz 100-25 MG  Take 1 tablet by mouth daily.  Marland Kitchen METROCREAM 0.75 % crm Apply topically 2 (two) times daily.  . montelukast 10 MG tablet Take 10 mg by mouth as needed (for seasonal allergies).  . montelukast  10 MG tablet TAKE ONE TABLET BY MOUTH AT BEDTIME  . phentermine  37.5 MG tablet TAKE 1 TAB EVERY MORNING AND 1/2 TAB AT 2PM   . ranitidine  300 MG tablet Take 1 to 2 tab daily   . diphenhydrAMINE 25 mg  Take 1 caps every 8  hours as needed  . famotidine 40 MG tablet Take 1 tablet (40 mg total) by mouth daily.   Allergies  Allergen Reactions  . Bee Venom Anaphylaxis  . Meloxicam Anaphylaxis and Other (See Comments)    Also felt dizzy after taking medication.   . Lisinopril Other (See Comments)    Patient intolerant of drug which caused his tongue to tingle.   Past Medical History:  Diagnosis Date  . Arthritis   . Environmental allergies   . OSA on CPAP 08/01/2014  . Reflux    Past Surgical History:  Procedure Laterality Date  . APPENDECTOMY  07/2012  . HEMORRHOID SURGERY   09/2012   Review of Systems  10 point systems review negative except as above.    Objective:   Physical Exam   BP 124/62   Pulse 84   Temp 98.2 F (36.8 C)   Resp 16   Ht 5' 9.5" (1.765 m)   Wt 243 lb 9.6 oz (110.5 kg)   BMI 35.46 kg/m   HEENT - Eac's patent. TM's Nl. EOM's full. PERRLA. NasoOroPharynx clear.  Exposed post at [place] lower 1st molar socket w/inflammed gums. Neck - supple. Nl Thyroid. Carotids 2+ & No bruits, nodes, JVD Chest - Clear equal BS w/o Rales, rhonchi, wheezes. Cor - Nl HS. RRR w/o sig MGR. PP 1(+). No edema. Abd - No palpable organomegaly, masses or tenderness. BS nl. MS- FROM w/o deformities. Muscle power, tone and bulk Nl. Gait Nl. Neuro - No obvious Cr N abnormalities. Sensory, motor and Cerebellar functions appear Nl w/o focal abnormalities.    Assessment & Plan:   1. Periodontitis  - cephALEXin (KEFLEX) 500 MG capsule; Take 1 capsule 4 x / day for infection  Dispense: 40 capsule; Refill: 0  - predniSONE (DELTASONE) 20 MG tablet; 1 tab 3 x  day for 3 days, then 1 tab 2 x day for 3 days, then 1 tab 1 x day for 5 days  Dispense: 20 tablet; Refill: 0  - traMADol (ULTRAM) 50 MG tablet; Take 1 tablet every 4 hrs if needed for pain  Dispense: 30 tablet; Refill: 0

## 2017-04-28 ENCOUNTER — Ambulatory Visit: Payer: Self-pay | Admitting: Internal Medicine

## 2017-05-04 ENCOUNTER — Telehealth (INDEPENDENT_AMBULATORY_CARE_PROVIDER_SITE_OTHER): Payer: Self-pay

## 2017-05-04 ENCOUNTER — Other Ambulatory Visit (INDEPENDENT_AMBULATORY_CARE_PROVIDER_SITE_OTHER): Payer: Self-pay

## 2017-05-04 MED ORDER — DICLOFENAC SODIUM 2 % TD SOLN: 2.0000 | Freq: Two times a day (BID) | TRANSDERMAL | 1 refills | Status: DC

## 2017-05-04 NOTE — Telephone Encounter (Signed)
Patients insurance denied Voltaren gel despite trying to get authorization. Do you want to try anything else? I could try Pennsaid to the specialty pharmacy if you want?

## 2017-05-04 NOTE — Telephone Encounter (Signed)
I submitted to specialty pharmacy. Called patient advised of this.

## 2017-05-04 NOTE — Telephone Encounter (Signed)
Try Pennsaid

## 2017-05-10 ENCOUNTER — Encounter: Payer: Self-pay | Admitting: Internal Medicine

## 2017-06-15 NOTE — Progress Notes (Deleted)
Assessment and Plan:   Hypertension -Continue medication, monitor blood pressure at home. Continue DASH diet.  Reminder to go to the ER if any CP, SOB, nausea, dizziness, severe HA, changes vision/speech, left arm numbness and tingling and jaw pain.  Cholesterol -Continue diet and exercise. Check cholesterol.    Prediabetes  -Continue diet and exercise. Check A1C  Vitamin D Def - check level and continue medications.   Continue diet and meds as discussed. Further disposition pending results of labs. Over 30 minutes of exam, counseling, chart review, and critical decision making was performed  Future Appointments Date Time Provider [place]  06/16/2017 2:30 PM Vicie Mutters, PA-C [facility] None  07/20/2017 10:30 AM Star Age, MD [facility] None  09/16/2017 2:30 PM Unk Pinto, MD [facility] None  03/17/2018 10:00 AM Unk Pinto, MD [facility] None     HPI 61 y.o. male  presents for 3 month follow up on hypertension, cholesterol, prediabetes, and vitamin D deficiency.   His blood pressure {HAS HAS NOT:18834} been controlled at home, today their BP is    He {DOES_DOES OZD:66440} workout. He denies chest pain, shortness of breath, dizziness.  He {ACTION; IS/IS HKV:42595638} on cholesterol medication and denies myalgias. His cholesterol {ACTION; IS/IS NOT:21021397} at goal. The cholesterol last visit was:   Lab Results  Component Value Date   CHOL 148 02/22/2017   HDL 33 (L) 02/22/2017   LDLCALC 80 02/22/2017   TRIG 173 (H) 02/22/2017   CHOLHDL 4.5 02/22/2017    He {Has/has not:18111} been working on diet and exercise for prediabetes, and denies {Symptoms; diabetes w/o none:19199}. Last A1C in the office was:  Lab Results  Component Value Date   HGBA1C 5.3 02/22/2017   Patient is on Vitamin D supplement.   Lab Results  Component Value Date   VD25OH 49 02/22/2017     BMI is There is no height or weight on file to calculate BMI., he is working on  diet and exercise. Wt Readings from Last 3 Encounters:  04/27/17 243 lb 9.6 oz (110.5 kg)  04/26/17 240 lb (108.9 kg)  02/22/17 239 lb 6.4 oz (108.6 kg)     Current Medications:  Current Outpatient Prescriptions on File Prior to Visit  Medication Sig  . albuterol (PROVENTIL HFA;VENTOLIN HFA) 108 (90 BASE) MCG/ACT inhaler Inhale 1 puff into the lungs every 6 (six) hours as needed for wheezing or shortness of breath. prn  . Cholecalciferol (VITAMIN D PO) Take 5,000 Units by mouth 3 (three) times daily.  . Diclofenac Sodium (PENNSAID) 2 % SOLN Place 2 Squirts onto the skin 2 (two) times daily.  . diclofenac sodium (VOLTAREN) 1 % GEL Apply 2 g topically 4 (four) times daily.  . diphenhydrAMINE (BENADRYL) 25 mg capsule Take 1 capsule (25 mg total) by mouth every 8 (eight) hours as needed for itching (rash).  . EPINEPHrine 0.3 mg/0.3 mL IJ SOAJ injection Inject 0.3 mg into the muscle once. prn  . famotidine (PEPCID) 40 MG tablet Take 1 tablet (40 mg total) by mouth daily.  . fenofibrate micronized (LOFIBRA) 134 MG capsule TAKE 1 CAPSULE BY MOUTH ONCE DAILY BEFORE  BREAKFAST  FOR  BLOOD  FATS  . HYDROcodone-acetaminophen (NORCO/VICODIN) 5-325 MG tablet Take 1/2 to 1 tablet every 3 to 4 hours if needed for cough or pain  . losartan-hydrochlorothiazide (HYZAAR) 100-25 MG tablet Take 1 tablet by mouth daily.  . metroNIDAZOLE (METROCREAM) 0.75 % cream Apply topically 2 (two) times daily.  . montelukast (SINGULAIR) 10  MG tablet Take 10 mg by mouth as needed (for seasonal allergies).  . montelukast (SINGULAIR) 10 MG tablet TAKE ONE TABLET BY MOUTH AT BEDTIME  . OVER THE COUNTER MEDICATION Reported on 10/24/2015  . phentermine (ADIPEX-P) 37.5 MG tablet TAKE 1 TABLET EVERY MORNING AND 1/2 TABLET AT 2PM FOR DIETING AND WEIGHT LOSS.  Marland Kitchen predniSONE (DELTASONE) 20 MG tablet 1 tab 3 x day for 3 days, then 1 tab 2 x day for 3 days, then 1 tab 1 x day for 5 days  . ranitidine (ZANTAC) 300 MG tablet Take 1 to 2  tablets daily for heartburn & reflux to allow wean and transition from PPI / pantoprazole  . traMADol (ULTRAM) 50 MG tablet Take 1 tablet every 4 hrs if needed for pain   No current facility-administered medications on file prior to visit.     Medical History:  Past Medical History:  Diagnosis Date  . Arthritis   . Environmental allergies   . OSA on CPAP 08/01/2014  . Reflux    Allergies:  Allergies  Allergen Reactions  . Bee Venom Anaphylaxis  . Meloxicam Anaphylaxis and Other (See Comments)    Also felt dizzy after taking medication.   . Lisinopril Other (See Comments)    Patient intolerant of drug which caused his tongue to tingle.     Review of Systems:  ROS  Family history- Review and unchanged Social history- Review and unchanged Physical Exam: There were no vitals taken for this visit. Wt Readings from Last 3 Encounters:  04/27/17 243 lb 9.6 oz (110.5 kg)  04/26/17 240 lb (108.9 kg)  02/22/17 239 lb 6.4 oz (108.6 kg)   General Appearance: Well nourished, in no apparent distress. Eyes: PERRLA, EOMs, conjunctiva no swelling or erythema Sinuses: No Frontal/maxillary tenderness ENT/Mouth: Ext aud canals clear, TMs without erythema, bulging. No erythema, swelling, or exudate on post pharynx.  Tonsils not swollen or erythematous. Hearing normal.  Neck: Supple, thyroid normal.  Respiratory: Respiratory effort normal, BS equal bilaterally without rales, rhonchi, wheezing or stridor.  Cardio: RRR with no MRGs. Brisk peripheral pulses without edema.  Abdomen: Soft, + BS,  Non tender, no guarding, rebound, hernias, masses. Lymphatics: Non tender without lymphadenopathy.  Musculoskeletal: Full ROM, 5/5 strength, {PSY - GAIT AND STATION:22860} gait Skin: Warm, dry without rashes, lesions, ecchymosis.  Neuro: Cranial nerves intact. Normal muscle tone, no cerebellar symptoms. Psych: Awake and oriented X 3, normal affect, Insight and Judgment appropriate.    Vicie Mutters,  PA-C 7:59 AM [facility]

## 2017-06-16 ENCOUNTER — Ambulatory Visit: Payer: Self-pay | Admitting: Physician Assistant

## 2017-06-28 NOTE — Progress Notes (Deleted)
Assessment and Plan:   Hypertension -Continue medication, monitor blood pressure at home. Continue DASH diet.  Reminder to go to the ER if any CP, SOB, nausea, dizziness, severe HA, changes vision/speech, left arm numbness and tingling and jaw pain.  Cholesterol -Continue diet and exercise. Check cholesterol.    Prediabetes  -Continue diet and exercise. Check A1C  Vitamin D Def - check level and continue medications.   Continue diet and meds as discussed. Further disposition pending results of labs. Over 30 minutes of exam, counseling, chart review, and critical decision making was performed  Future Appointments Date Time Provider [place]  06/29/2017 10:45 AM Vicie Mutters, PA-C [facility] None  07/20/2017 10:30 AM Star Age, MD [facility] None  09/16/2017 2:30 PM Unk Pinto, MD [facility] None  03/17/2018 10:00 AM Unk Pinto, MD [facility] None     HPI 61 y.o. male  presents for 3 month follow up on hypertension, cholesterol, prediabetes, and vitamin D deficiency.   His blood pressure {HAS HAS NOT:18834} been controlled at home, today their BP is    He {DOES_DOES QAS:34196} workout. He denies chest pain, shortness of breath, dizziness.  He {ACTION; IS/IS QIW:97989211} on cholesterol medication and denies myalgias. His cholesterol {ACTION; IS/IS NOT:21021397} at goal. The cholesterol last visit was:   Lab Results  Component Value Date   CHOL 148 02/22/2017   HDL 33 (L) 02/22/2017   LDLCALC 80 02/22/2017   TRIG 173 (H) 02/22/2017   CHOLHDL 4.5 02/22/2017    He {Has/has not:18111} been working on diet and exercise for prediabetes, and denies {Symptoms; diabetes w/o none:19199}. Last A1C in the office was:  Lab Results  Component Value Date   HGBA1C 5.3 02/22/2017   Patient is on Vitamin D supplement.   Lab Results  Component Value Date   VD25OH 49 02/22/2017     BMI is There is no height or weight on file to calculate BMI., he is working on  diet and exercise. Wt Readings from Last 3 Encounters:  04/27/17 243 lb 9.6 oz (110.5 kg)  04/26/17 240 lb (108.9 kg)  02/22/17 239 lb 6.4 oz (108.6 kg)     Current Medications:  Current Outpatient Prescriptions on File Prior to Visit  Medication Sig  . albuterol (PROVENTIL HFA;VENTOLIN HFA) 108 (90 BASE) MCG/ACT inhaler Inhale 1 puff into the lungs every 6 (six) hours as needed for wheezing or shortness of breath. prn  . Cholecalciferol (VITAMIN D PO) Take 5,000 Units by mouth 3 (three) times daily.  . Diclofenac Sodium (PENNSAID) 2 % SOLN Place 2 Squirts onto the skin 2 (two) times daily.  . diclofenac sodium (VOLTAREN) 1 % GEL Apply 2 g topically 4 (four) times daily.  . diphenhydrAMINE (BENADRYL) 25 mg capsule Take 1 capsule (25 mg total) by mouth every 8 (eight) hours as needed for itching (rash).  . EPINEPHrine 0.3 mg/0.3 mL IJ SOAJ injection Inject 0.3 mg into the muscle once. prn  . famotidine (PEPCID) 40 MG tablet Take 1 tablet (40 mg total) by mouth daily.  . fenofibrate micronized (LOFIBRA) 134 MG capsule TAKE 1 CAPSULE BY MOUTH ONCE DAILY BEFORE  BREAKFAST  FOR  BLOOD  FATS  . HYDROcodone-acetaminophen (NORCO/VICODIN) 5-325 MG tablet Take 1/2 to 1 tablet every 3 to 4 hours if needed for cough or pain  . losartan-hydrochlorothiazide (HYZAAR) 100-25 MG tablet Take 1 tablet by mouth daily.  . metroNIDAZOLE (METROCREAM) 0.75 % cream Apply topically 2 (two) times daily.  . montelukast (SINGULAIR) 10  MG tablet Take 10 mg by mouth as needed (for seasonal allergies).  . montelukast (SINGULAIR) 10 MG tablet TAKE ONE TABLET BY MOUTH AT BEDTIME  . OVER THE COUNTER MEDICATION Reported on 10/24/2015  . phentermine (ADIPEX-P) 37.5 MG tablet TAKE 1 TABLET EVERY MORNING AND 1/2 TABLET AT 2PM FOR DIETING AND WEIGHT LOSS.  Marland Kitchen predniSONE (DELTASONE) 20 MG tablet 1 tab 3 x day for 3 days, then 1 tab 2 x day for 3 days, then 1 tab 1 x day for 5 days  . ranitidine (ZANTAC) 300 MG tablet Take 1 to 2  tablets daily for heartburn & reflux to allow wean and transition from PPI / pantoprazole  . traMADol (ULTRAM) 50 MG tablet Take 1 tablet every 4 hrs if needed for pain   No current facility-administered medications on file prior to visit.     Medical History:  Past Medical History:  Diagnosis Date  . Arthritis   . Environmental allergies   . OSA on CPAP 08/01/2014  . Reflux    Allergies:  Allergies  Allergen Reactions  . Bee Venom Anaphylaxis  . Meloxicam Anaphylaxis and Other (See Comments)    Also felt dizzy after taking medication.   . Lisinopril Other (See Comments)    Patient intolerant of drug which caused his tongue to tingle.     Review of Systems:  ROS  Family history- Review and unchanged Social history- Review and unchanged Physical Exam: There were no vitals taken for this visit. Wt Readings from Last 3 Encounters:  04/27/17 243 lb 9.6 oz (110.5 kg)  04/26/17 240 lb (108.9 kg)  02/22/17 239 lb 6.4 oz (108.6 kg)   General Appearance: Well nourished, in no apparent distress. Eyes: PERRLA, EOMs, conjunctiva no swelling or erythema Sinuses: No Frontal/maxillary tenderness ENT/Mouth: Ext aud canals clear, TMs without erythema, bulging. No erythema, swelling, or exudate on post pharynx.  Tonsils not swollen or erythematous. Hearing normal.  Neck: Supple, thyroid normal.  Respiratory: Respiratory effort normal, BS equal bilaterally without rales, rhonchi, wheezing or stridor.  Cardio: RRR with no MRGs. Brisk peripheral pulses without edema.  Abdomen: Soft, + BS,  Non tender, no guarding, rebound, hernias, masses. Lymphatics: Non tender without lymphadenopathy.  Musculoskeletal: Full ROM, 5/5 strength, {PSY - GAIT AND STATION:22860} gait Skin: Warm, dry without rashes, lesions, ecchymosis.  Neuro: Cranial nerves intact. Normal muscle tone, no cerebellar symptoms. Psych: Awake and oriented X 3, normal affect, Insight and Judgment appropriate.    Vicie Mutters,  PA-C 7:24 AM [facility]

## 2017-06-29 ENCOUNTER — Ambulatory Visit: Payer: Self-pay | Admitting: Physician Assistant

## 2017-07-14 ENCOUNTER — Other Ambulatory Visit: Payer: Self-pay | Admitting: Internal Medicine

## 2017-07-14 MED ORDER — AZITHROMYCIN 250 MG PO TABS: ORAL_TABLET | ORAL | 0 refills | Status: DC

## 2017-07-14 MED ORDER — PREDNISONE 20 MG PO TABS: ORAL_TABLET | ORAL | 0 refills | Status: DC

## 2017-07-19 ENCOUNTER — Telehealth: Payer: Self-pay

## 2017-07-19 NOTE — Telephone Encounter (Signed)
I called pt and reminded him to bring his cpap chip to his appt with Dr. Rexene Alberts tomorrow. Pt verbalized understanding.

## 2017-07-20 ENCOUNTER — Other Ambulatory Visit: Payer: Self-pay | Admitting: Internal Medicine

## 2017-07-20 ENCOUNTER — Ambulatory Visit: Payer: BLUE CROSS/BLUE SHIELD | Admitting: Neurology

## 2017-07-20 ENCOUNTER — Ambulatory Visit (HOSPITAL_COMMUNITY)
Admission: RE | Admit: 2017-07-20 | Discharge: 2017-07-20 | Disposition: A | Discharge status: Home / Self Care | Payer: BLUE CROSS/BLUE SHIELD | Source: Ambulatory Visit | Attending: Internal Medicine | Admitting: Internal Medicine

## 2017-07-20 ENCOUNTER — Telehealth: Payer: Self-pay | Admitting: Neurology

## 2017-07-20 ENCOUNTER — Encounter: Payer: Self-pay | Admitting: Adult Health

## 2017-07-20 ENCOUNTER — Ambulatory Visit (INDEPENDENT_AMBULATORY_CARE_PROVIDER_SITE_OTHER): Payer: BLUE CROSS/BLUE SHIELD | Admitting: Adult Health

## 2017-07-20 VITALS — BP 130/86 | HR 67 | Temp 97.9°F | Resp 18 | Ht 69.5 in | Wt 240.0 lb

## 2017-07-20 DIAGNOSIS — R7303 Prediabetes: Secondary | ICD-10-CM

## 2017-07-20 DIAGNOSIS — J209 Acute bronchitis, unspecified: Secondary | ICD-10-CM | POA: Diagnosis not present

## 2017-07-20 DIAGNOSIS — E559 Vitamin D deficiency, unspecified: Secondary | ICD-10-CM | POA: Diagnosis not present

## 2017-07-20 DIAGNOSIS — F32A Depression, unspecified: Secondary | ICD-10-CM

## 2017-07-20 DIAGNOSIS — F419 Anxiety disorder, unspecified: Secondary | ICD-10-CM | POA: Insufficient documentation

## 2017-07-20 DIAGNOSIS — R05 Cough: Secondary | ICD-10-CM | POA: Insufficient documentation

## 2017-07-20 DIAGNOSIS — F329 Major depressive disorder, single episode, unspecified: Secondary | ICD-10-CM | POA: Diagnosis not present

## 2017-07-20 DIAGNOSIS — I10 Essential (primary) hypertension: Secondary | ICD-10-CM | POA: Diagnosis not present

## 2017-07-20 DIAGNOSIS — R059 Cough, unspecified: Secondary | ICD-10-CM

## 2017-07-20 DIAGNOSIS — Z79899 Other long term (current) drug therapy: Secondary | ICD-10-CM

## 2017-07-20 DIAGNOSIS — E782 Mixed hyperlipidemia: Secondary | ICD-10-CM

## 2017-07-20 MED ORDER — AZITHROMYCIN 250 MG PO TABS: ORAL_TABLET | ORAL | 0 refills | Status: AC

## 2017-07-20 MED ORDER — BENZONATATE 100 MG PO CAPS: 200.0000 mg | ORAL_CAPSULE | Freq: Three times a day (TID) | ORAL | 0 refills | Status: DC | PRN

## 2017-07-20 MED ORDER — PREDNISONE 20 MG PO TABS: ORAL_TABLET | ORAL | 0 refills | Status: DC

## 2017-07-20 NOTE — Patient Instructions (Addendum)
Start tracking calories, I will send you a link for daily caloric need estimator.    We want weight loss that will last so you should lose 1-2 pounds a week.  THAT IS IT! Please pick THREE things a month to change. Once it is a habit check off the item. Then pick another three items off the list to become habits.  If you are already doing a habit on the list GREAT!  Cross that item off! o Don't drink your calories. Ie, alcohol, soda, fruit juice, and sweet tea.  o Drink more water. Drink a glass when you feel hungry or before each meal.  o Eat breakfast - Complex carb and protein (likeDannon light and fit yogurt, oatmeal, fruit, eggs, [place] bacon). o Measure your cereal.  Eat no more than one cup a day. (ie [place]) o Eat an apple a day. o Add a vegetable a day. o Try a new vegetable a month. o Use Pam! Stop using oil or butter to cook. o Don't finish your plate or use smaller plates. o Share your dessert. o Eat sugar free Jello for dessert or frozen grapes. o Don't eat 2-3 hours before bed. o Switch to whole wheat bread, pasta, and brown rice. o Make healthier choices when you eat out. No fries! o Pick baked chicken, NOT fried. o Don't forget to SLOW DOWN when you eat. It is not going anywhere.  o Take the stairs. o Park far away in the parking lot o [employer] (or weights) for 10 minutes while watching TV. o Walk at work for 10 minutes during break. o Walk outside 1 time a week with your friend, kids, dog, or significant other. o Start a walking group at [place] the mall as much as you can tolerate.  o Keep a food diary. o Weigh yourself daily. o Walk for 15 minutes 3 days per week. o Cook at home more often and eat out less.  If life happens and you go back to old habits, it is okay.  Just start over. You can do it!   If you experience chest pain, get short of breath, or tired during the exercise, please stop immediately and inform your doctor.

## 2017-07-20 NOTE — Progress Notes (Signed)
FOLLOW UP  Assessment and Plan:    Hypertension  Continue medication: losartan-hctz 100-25 mg  Monitor blood pressure at home.  Continue DASH diet.    Reminder to go to the ER if any CP, SOB, nausea, dizziness, severe HA, changes vision/speech, left arm numbness and tingling and jaw pain.  Cholesterol  Continue medication: fenofibrate 134 mg cap daily  Continue diet and exercise.   Check lipid panel.    Preiabetes  Continue diet and exercise  Perform daily foot/skin check, notify office of any concerning changes.   Check A1C  Vitamin D Def  Continue medications: 5000 IU daily  Check Vit D level  Morbid Obesity with co morbidities  long discussion about weight loss, diet, and exercise  Has taken 30 days off of phentermine, restarting today  Discussed need for caloric deficit -start tracking calories with app- will provide basal caloric need estimation  Recommend coffee with 2 % milk, and sugar free sweetener such as stevia instead of sugar  Acute bronchitis  Sending z-pack/prednisone, continue albuterol PRN  Start taking allergy medication daily, including flonase 1 spray in each nostril daily until this fully clears up. Next year start taking allergy medication daily 2-3 weeks before you normally get sick.   Benzonatate provided for cough   Continue diet and meds as discussed. Further disposition pending results of labs. Discussed med's effects and SE's.   Over 30 minutes of exam, counseling, chart review, and critical decision making was performed.   Future Appointments Date Time Provider [place]  09/22/2017 1:00 PM Star Age, MD [facility] None  10/29/2017 10:30 AM Unk Pinto, MD [facility] None  03/17/2018 10:00 AM Unk Pinto, MD [facility] None    ----------------------------------------------------------------------------------------------------------------------  HPI 61 y.o. male  presents for 3 month follow up on  hypertension, cholesterol, prediabetes, morbid obesity and vitamin D deficiency. The patient reports he did a self-taper off of sertraline and remained off for 6 weeks due to sexual side effects, but began experiencing depressive symptoms and has restarted the medication.   Complains of respiratory symptoms x 9 days, this happens every year about this time- Started as scratchy throat, runny nose, now has productive cough. Does endorse chills, checked a temp was below normal. Dr. Melford Aase prescribed z-pack and prednisone last week- patient states he was feeling much better by this past Saturday he split wood for a few hours then started feeling worse with productive cough, wheezing, fatigue, chills. Has been taking allegra daily for the past week, has been using flonase PRN. Has been taking a cough medication from a previous illness but has run out. CXR taken today was reviewed- unremarkable. He reports he was sent for spirometry last year that was negative- did not indicate chronic respiratory disease.   His blood pressure has been controlled at home, today their BP is BP: 130/86  He does workout. He denies chest pain, shortness of breath, dizziness.  he is prescribed phentermine for weight loss. He has been off of the medication for 30 days. While on the medication they have lost 3 lbs since last visit.   BMI is Body mass index is 34.93 kg/m., he is working on diet and exercise. Wt Readings from Last 3 Encounters:  07/20/17 240 lb (108.9 kg)  04/27/17 243 lb 9.6 oz (110.5 kg)  04/26/17 240 lb (108.9 kg)   Diet: carb limiting to 20 mg daily for the past several weeks; lean meats, vegetable soups. Omlette with vegetables for breakfast. Exercise: Heavy yard work  4-5 hours three times a week. Has not been able to walk as much related to busy season at work, but has started walking twice a week with his wife. Water intake: water goal has been reaching 80 oz, minimal soda, lots of coffee with  cream/sugar   He is on cholesterol medication and denies myalgias. His cholesterol is at goal. The cholesterol last visit was:   Lab Results  Component Value Date   CHOL 148 02/22/2017   HDL 33 (L) 02/22/2017   LDLCALC 80 02/22/2017   TRIG 173 (H) 02/22/2017   CHOLHDL 4.5 02/22/2017    He has been working on diet and exercise for prediabetes, and denies hyperglycemia, hypoglycemia , increased appetite, nausea, paresthesia of the feet, polydipsia and polyuria. Last A1C in the office was:  Lab Results  Component Value Date   HGBA1C 5.3 02/22/2017   Patient is on high-dose Vitamin D supplement but below goal of 60-80:   Lab Results  Component Value Date   VD25OH 49 02/22/2017          Current Medications:  Current Outpatient Prescriptions on File Prior to Visit  Medication Sig  . albuterol (PROVENTIL HFA;VENTOLIN HFA) 108 (90 BASE) MCG/ACT inhaler Inhale 1 puff into the lungs every 6 (six) hours as needed for wheezing or shortness of breath. prn  . Cholecalciferol (VITAMIN D PO) Take 5,000 Units by mouth 3 (three) times daily.  . Diclofenac Sodium (PENNSAID) 2 % SOLN Place 2 Squirts onto the skin 2 (two) times daily.  . diclofenac sodium (VOLTAREN) 1 % GEL Apply 2 g topically 4 (four) times daily.  Marland Kitchen EPINEPHrine 0.3 mg/0.3 mL IJ SOAJ injection Inject 0.3 mg into the muscle once. prn  . fenofibrate micronized (LOFIBRA) 134 MG capsule TAKE 1 CAPSULE BY MOUTH ONCE DAILY BEFORE  BREAKFAST  FOR  BLOOD  FATS  . HYDROcodone-acetaminophen (NORCO/VICODIN) 5-325 MG tablet Take 1/2 to 1 tablet every 3 to 4 hours if needed for cough or pain  . losartan-hydrochlorothiazide (HYZAAR) 100-25 MG tablet Take 1 tablet by mouth daily.  . metroNIDAZOLE (METROCREAM) 0.75 % cream Apply topically 2 (two) times daily.  . montelukast (SINGULAIR) 10 MG tablet Take 10 mg by mouth as needed (for seasonal allergies).  . montelukast (SINGULAIR) 10 MG tablet TAKE ONE TABLET BY MOUTH AT BEDTIME  . OVER THE  COUNTER MEDICATION Reported on 10/24/2015  . phentermine (ADIPEX-P) 37.5 MG tablet TAKE 1 TABLET EVERY MORNING AND 1/2 TABLET AT 2PM FOR DIETING AND WEIGHT LOSS.  . ranitidine (ZANTAC) 300 MG tablet Take 1 to 2 tablets daily for heartburn & reflux to allow wean and transition from PPI / pantoprazole  . traMADol (ULTRAM) 50 MG tablet Take 1 tablet every 4 hrs if needed for pain  . diphenhydrAMINE (BENADRYL) 25 mg capsule Take 1 capsule (25 mg total) by mouth every 8 (eight) hours as needed for itching (rash).  . famotidine (PEPCID) 40 MG tablet Take 1 tablet (40 mg total) by mouth daily.   No current facility-administered medications on file prior to visit.      Allergies:  Allergies  Allergen Reactions  . Bee Venom Anaphylaxis  . Meloxicam Anaphylaxis and Other (See Comments)    Also felt dizzy after taking medication.   . Lisinopril Other (See Comments)    Patient intolerant of drug which caused his tongue to tingle.     Medical History:  Past Medical History:  Diagnosis Date  . Arthritis   . Environmental allergies   .  OSA on CPAP 08/01/2014  . Reflux    Family history- Reviewed and unchanged Social history- Reviewed and unchanged   Review of Systems:  Review of Systems  Constitutional: Positive for chills and malaise/fatigue. Negative for diaphoresis, fever and weight loss.  HENT: Negative for congestion, ear discharge, ear pain, hearing loss, sinus pain and sore throat.   Eyes: Negative for blurred vision.  Respiratory: Positive for sputum production and wheezing. Negative for cough and shortness of breath.   Cardiovascular: Negative for chest pain, palpitations, orthopnea, claudication and leg swelling.  Gastrointestinal: Positive for heartburn. Negative for abdominal pain, blood in stool, constipation, diarrhea, melena, nausea and vomiting.  Genitourinary: Negative.   Musculoskeletal: Positive for joint pain (Bilateral hands- seeing Dr. Ninfa Linden soon). Negative for  myalgias.  Skin: Negative.  Negative for rash.  Neurological: Negative for dizziness, tingling, sensory change, weakness and headaches.  Endo/Heme/Allergies: Positive for environmental allergies. Negative for polydipsia.  Psychiatric/Behavioral: Negative.       Physical Exam: BP 130/86   Pulse 67   Temp 97.9 F (36.6 C)   Resp 18   Ht 5' 9.5" (1.765 m)   Wt 240 lb (108.9 kg)   SpO2 97%   BMI 34.93 kg/m  Wt Readings from Last 3 Encounters:  07/20/17 240 lb (108.9 kg)  04/27/17 243 lb 9.6 oz (110.5 kg)  04/26/17 240 lb (108.9 kg)   General Appearance: Well nourished, in no apparent distress. Eyes: PERRLA, EOMs, conjunctiva no swelling or erythema Sinuses: No Frontal/maxillary tenderness ENT/Mouth: Ext aud canals clear, TMs without erythema, bulging. No erythema, swelling, or exudate on post pharynx.  Tonsils not swollen or erythematous. Hearing normal.  Neck: Supple, thyroid normal.  Respiratory: Respiratory effort normal, BS equal bilaterally with scattered rhonchi no wheezing or stridor.  Cardio: RRR with no MRGs. Brisk peripheral pulses without edema.  Abdomen: Soft, + BS.  Non tender, no guarding, rebound, hernias, masses. Lymphatics: Non tender without lymphadenopathy.  Musculoskeletal: Full ROM, 5/5 strength, Normal gait Skin: Warm, dry without rashes, lesions, ecchymosis.  Neuro: Cranial nerves intact. No cerebellar symptoms.  Psych: Awake and oriented X 3, normal affect, Insight and Judgment appropriate.    Izora Ribas, NP 2:51 PM [facility]

## 2017-07-20 NOTE — Telephone Encounter (Signed)
Called patient to reschedule his apt for today due to an emergency with Dr Rexene Alberts. LVM informing the patient that we would need to reschedule and have asked the patient to call back.

## 2017-07-21 ENCOUNTER — Encounter: Payer: Self-pay | Admitting: Adult Health

## 2017-07-21 ENCOUNTER — Other Ambulatory Visit: Payer: Self-pay | Admitting: Internal Medicine

## 2017-07-23 ENCOUNTER — Other Ambulatory Visit: Payer: Self-pay | Admitting: Internal Medicine

## 2017-07-23 DIAGNOSIS — K0530 Chronic periodontitis, unspecified: Secondary | ICD-10-CM

## 2017-07-23 MED ORDER — PROMETHAZINE-DM 6.25-15 MG/5ML PO SYRP: ORAL_SOLUTION | ORAL | 1 refills | Status: DC

## 2017-07-23 MED ORDER — TRAMADOL HCL 50 MG PO TABS: ORAL_TABLET | ORAL | 0 refills | Status: DC

## 2017-08-09 ENCOUNTER — Other Ambulatory Visit: Payer: Self-pay | Admitting: Internal Medicine

## 2017-08-09 DIAGNOSIS — N529 Male erectile dysfunction, unspecified: Secondary | ICD-10-CM

## 2017-08-09 MED ORDER — SILDENAFIL CITRATE 20 MG PO TABS: ORAL_TABLET | ORAL | 11 refills | Status: DC

## 2017-08-19 ENCOUNTER — Ambulatory Visit (INDEPENDENT_AMBULATORY_CARE_PROVIDER_SITE_OTHER): Payer: BLUE CROSS/BLUE SHIELD | Admitting: *Deleted

## 2017-08-19 DIAGNOSIS — Z23 Encounter for immunization: Secondary | ICD-10-CM

## 2017-08-24 ENCOUNTER — Other Ambulatory Visit: Payer: Self-pay | Admitting: *Deleted

## 2017-08-24 DIAGNOSIS — N529 Male erectile dysfunction, unspecified: Secondary | ICD-10-CM

## 2017-08-24 MED ORDER — SILDENAFIL CITRATE 20 MG PO TABS: ORAL_TABLET | ORAL | 11 refills | Status: DC

## 2017-08-27 ENCOUNTER — Telehealth (INDEPENDENT_AMBULATORY_CARE_PROVIDER_SITE_OTHER): Payer: Self-pay | Admitting: Orthopaedic Surgery

## 2017-08-27 ENCOUNTER — Other Ambulatory Visit (INDEPENDENT_AMBULATORY_CARE_PROVIDER_SITE_OTHER): Payer: Self-pay

## 2017-08-27 MED ORDER — METHYLPREDNISOLONE 4 MG PO TABS: 4.0000 mg | ORAL_TABLET | Freq: Every day | ORAL | 0 refills | Status: DC

## 2017-08-27 NOTE — Telephone Encounter (Signed)
Called Rx into pharmacy.  

## 2017-08-27 NOTE — Telephone Encounter (Signed)
All of patients knuckles are swollen and painful and patient needs some kind of medication to help with inflammation and pain. He mentioned a steroid but was not sure what would be best.  Please advise on [phone]

## 2017-08-27 NOTE — Telephone Encounter (Signed)
Please send in a medrol dose pack. Thanks

## 2017-08-27 NOTE — Telephone Encounter (Signed)
See below

## 2017-09-16 ENCOUNTER — Ambulatory Visit: Payer: Self-pay | Admitting: Internal Medicine

## 2017-09-18 IMAGING — CT CT CHEST LUNG CANCER SCREENING LOW DOSE W/O CM
2 of 5 series · 8 of 40 positions shown, 10 images · non-contrast
Comparison: None.

CLINICAL DATA: 61-year-old male former smoker, quit 5 years ago,
with 40 pack-year history of smoking, for initial lung cancer
screening

EXAM:
CT CHEST WITHOUT CONTRAST LOW-DOSE FOR LUNG CANCER SCREENING
TECHNIQUE: Multidetector CT imaging of the chest was performed following the
standard protocol without IV contrast.

[Series 4: cor · coronal · 0.71mm/px · 3 of 363 slices shown]
[im 73/363  lung]
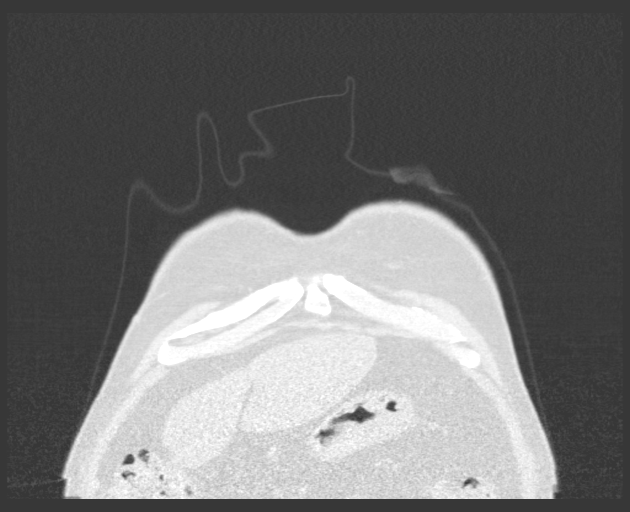
[im 145/363  lung]
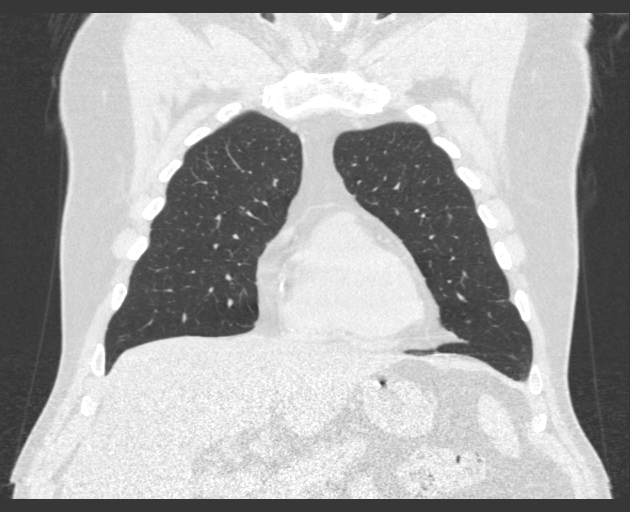
[im 218/363  lung]
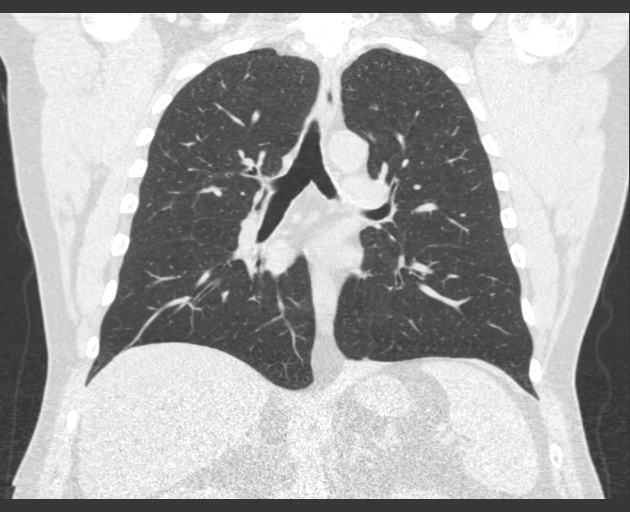

[ct lung segmentation data · axial · 0.86mm/px · z∈[-109,-109]mm · 5 of 301 frames shown]
[frame 1/301  mediastinal]
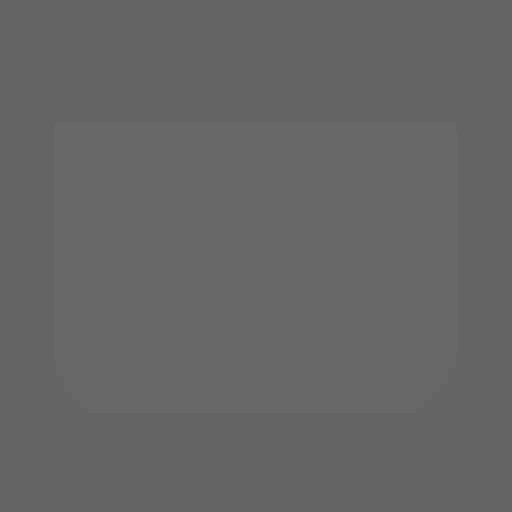
[frame 1/301  lung]
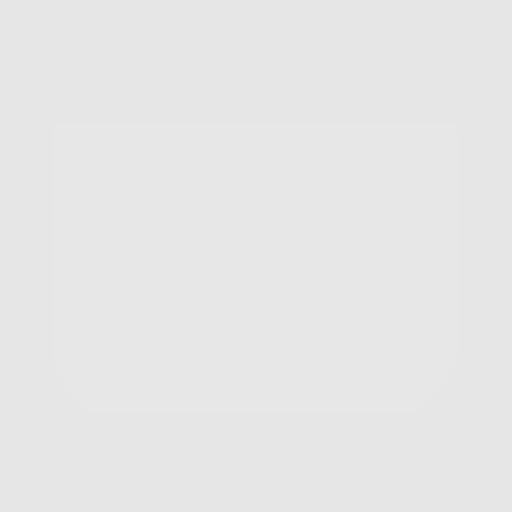
[frame 34/301  lung]
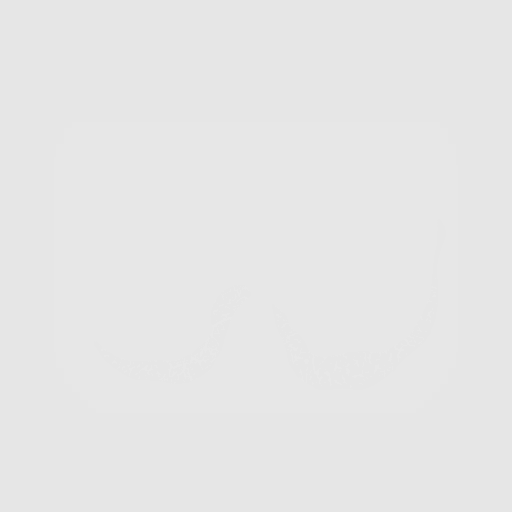
[frame 67/301  lung]
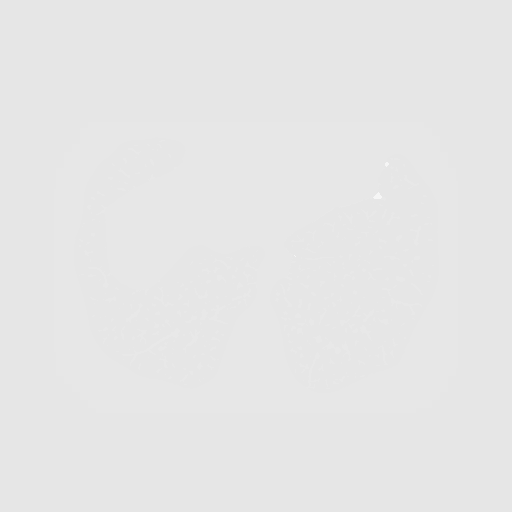
[frame 101/301  lung]
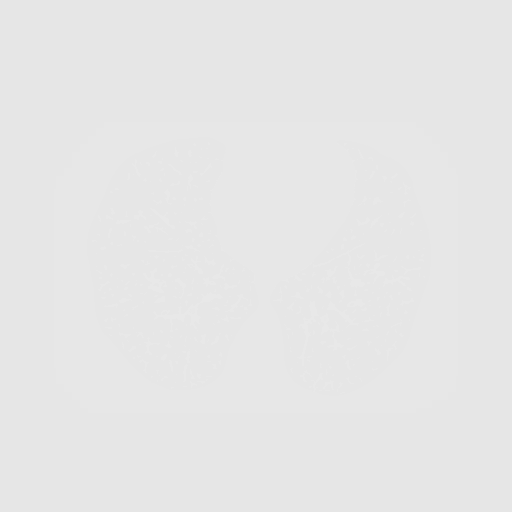
[frame 134/301  mediastinal]
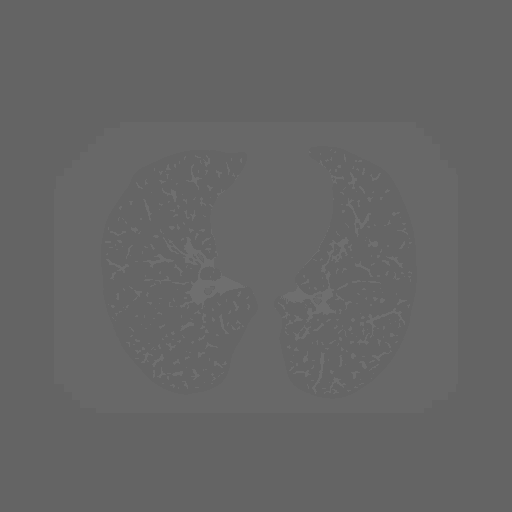
[frame 134/301  lung]
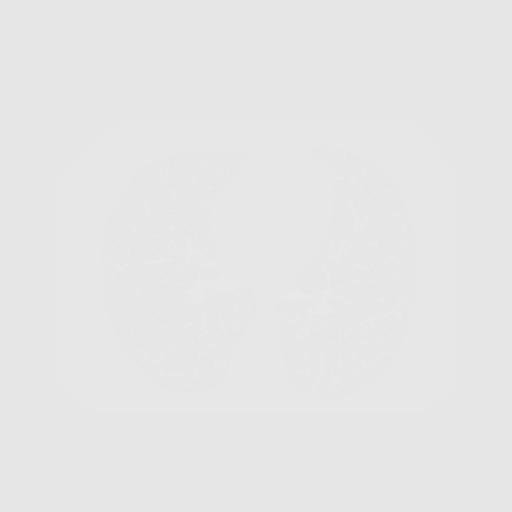

[8 of 40 positions shown; findings below may reference images not displayed]

FINDINGS: Cardiovascular: The heart is normal in size. No pericardial
effusion.

Three vessel coronary atherosclerosis.

No evidence of thoracic aortic aneurysm.

Mediastinum/Nodes: No suspicious mediastinal lymphadenopathy.

Visualized thyroid is unremarkable.

Lungs/Pleura: Mild biapical pleural-parenchymal scarring.

Mild centrilobular emphysematous changes, upper lobe predominant.

Mild linear scarring in the lateral left upper lobe.

No focal consolidation.

No suspicious pulmonary nodules.

No pleural effusion or pneumothorax.

Upper Abdomen: Visualized upper abdomen is notable for moderate
hepatic steatosis.

Musculoskeletal: Mild degenerative changes of the visualized
thoracolumbar spine.
IMPRESSION: Lung-RADS 1, negative. Continue annual screening with low-dose chest
CT without contrast in 12 months.

## 2017-09-21 ENCOUNTER — Encounter: Payer: Self-pay | Admitting: Neurology

## 2017-09-21 ENCOUNTER — Telehealth: Payer: Self-pay

## 2017-09-21 NOTE — Telephone Encounter (Signed)
Pt returned RN's call. Pt reminded to bring cpap chip.

## 2017-09-21 NOTE — Telephone Encounter (Signed)
I called pt to remind him to bring his cpap chip. No answer, left a message asking him to call me back. If pt calls back, please remind him to bring his cpap chip to the appt tomorrow with Dr. Rexene Alberts.

## 2017-09-22 ENCOUNTER — Ambulatory Visit: Payer: BLUE CROSS/BLUE SHIELD | Admitting: Neurology

## 2017-09-22 ENCOUNTER — Encounter: Payer: Self-pay | Admitting: Neurology

## 2017-09-22 VITALS — BP 149/81 | HR 73 | Ht 69.0 in | Wt 242.0 lb

## 2017-09-22 DIAGNOSIS — G4733 Obstructive sleep apnea (adult) (pediatric): Secondary | ICD-10-CM | POA: Diagnosis not present

## 2017-09-22 DIAGNOSIS — G479 Sleep disorder, unspecified: Secondary | ICD-10-CM | POA: Diagnosis not present

## 2017-09-22 NOTE — Progress Notes (Signed)
Subjective:    Patient ID: George Montgomery is a 61 y.o. male.  HPI     Interim history:   George Montgomery is a 61 year old right-handed gentleman with an underlying medical  history of cervical and lumbar degenerative spine disease, anxiety, depression (not currently on medication d/t side), asthma, hypertension, obesity, and OSA, who presents for follow-up consultation of his history of obstructive sleep apnea for his yearly checkup. The patient is unaccompanied today. I last saw him on 07/20/2016, at which time he reported doing quite well, no further headaches, had some joint pain, requested to switch in his DME provider to [facility]. He was compliant with his BiPAP of 17/13 cm.  Today, 09/22/2017: I reviewed his BiPAP compliance data from 08/23/2017 through 09/21/2014 which is a total of 30 days, during which time he used his machine every night with percent used days greater than 4 hours at 100%, indicating superb compliance with an average usage of 8 hours and 22 minutes, residual AHI at goal at 1.9 per hour, leak acceptable, pressure of 17/13 cm. He reports sleep disruption from joint pain and nocturia. Has not seen urologist recently. He has bilateral shoulder pain, has seen orthopedics for this before. Also arthritis in both hands, has received injections into his hands before.  The patient's allergies, current medications, family history, past medical history, past social history, past surgical history and problem list were reviewed and updated as appropriate.   Previously (copied from previous notes for reference):   I saw him on 07/18/2015, at which time he reported feeling better. Headaches had improved. He was compliant with BiPAP. He was on verapamil. Unfortunately, he had taken a fall on his boat and landed on both knees, was suffering from right knee pain since then and was supposed to see her orthopedic doctor. He was taking trazodone as needed, 150 mg strength, a quarter of a pill as  needed.   I saw him on 08/27/2014 and he canceled an appointment for December 2015. He was advised to follow-up in one month at the time. I suggested sumatriptan 100 mg strength for abortive treatment of cluster headaches at the time and starting him on verapamil for headache prevention. I asked him to be compliant with BiPAP therapy. I asked him to undergo a brain MRI. He did not pursue this as his headaches improved after he started the verapamil and he started feeling better.   I reviewed his BiPAP compliance data from 06/18/2015 through 07/17/2015 which is a total of 30 days during which time he was 100% compliant. Average usage was 8 hours and 57 minutes. He estimates that he sleeps about 6-7 hours on an average night. Sometimes he has to get up to use the bathroom. Average AHI was 1.5 per hour, pressure of 17/13, leak low.   The patient reports new onset HA since 08/02/14, started abruptly. No triggers noted. He recently had the flu shot. He also stopped his testosterone replacement about a month ago. He does endorse more stress, work related. He has also recently increased his caffeine intake. He has had a daily headache which lasts up to 2 hours. It typically starts in the evening. It is right-sided behind his eye and retro-orbital also in the temporal area and radiates to the back. It is severe. He does get puffiness around the right eye and occasional runny nose on the right side. He does not have any double vision or blurry vision. Glasses have not been checked in over 18 months.  He does not have nausea but does have to lie down. Hydrocodone has not helped it only 1 pill and he has to take 2. He has also taken high-dose Motrin, 600 mg, and has tried a muscle relaxant, he has to lie down. He has never had anything like this before. He does not have a history of migraines before. He does not note any neurological symptoms and in between he feels at baseline. He has been reluctant to do anything  besides the minimal for fear of exacerbating these headaches. They have been daily since 08/02/2014. He was prescribed Frovatriptan on 08/21/14 by Dr. Bebe Shaggy, but his insurance did not cover that. He has tried sumatriptan and 50 mg which he felt has helped after about 15-20 minutes.he has found relief with taking a warm bath. He has also tried heat pad on his head, which provided some relief.   The patient recalls being diagnosed with OSA some 12 years ago, severe, per patient. Over the years, he has had about 3 sleep studies, last some 3 years ago. He was originally placed on CPAP and most recently at the last sleep study he remembers that he was switched to BiPAP. He brought his BiPAP machine in for review and we were able to get a download: This was from 05/01/2014 through 07/31/2014 during which time he used his BiPAP machine every night without any skipped days and his percent used days greater than 4 hours was 100% indicating superb compliance, average usage was 8 hours and 7 minutes approximately. Residual AHI was low at 0.7 per hour and leak was very low at 1 minute and 58 seconds as far as large leak per day dose. His pressure is 17/13 cm.     His typical bedtime is between 10:30 and 11 PM and usually falls asleep quickly. Weight time is between 6 and 6:30 AM. He estimates that he gets about 7 hours of sleep. He denies any morning headaches or severe daytime somnolence. He does have an ESS of 11/24 today. He denies significant restless leg symptoms. His first sleep study may have been about 12 years ago. He gained quite a bit of weight after he quit smoking about 2 years ago. He gets his DME care through [facility].     His Past Medical History Is Significant For: Past Medical History:  Diagnosis Date  . Arthritis   . Environmental allergies   . OSA on CPAP 08/01/2014  . Reflux     His Past Surgical History Is Significant For: Past Surgical History:  Procedure Laterality  Date  . APPENDECTOMY  07/2012  . HEMORRHOID SURGERY  09/2012    His Family History Is Significant For: Family History  Problem Relation Age of Onset  . Heart disease Mother   . Macular degeneration Mother   . Cancer - Colon Other   . Cancer - Prostate Other   . Stroke Maternal Aunt   . Cancer - Colon Paternal Uncle   . Cancer - Prostate Paternal Uncle     His Social History Is Significant For: Social History   Socioeconomic History  . Marital status: Married    Spouse name: [name]  . Number of children: 4  . Years of education: college  . Highest education level: None  Social Needs  . Financial resource strain: None  . Food insecurity - worry: None  . Food insecurity - inability: None  . Transportation needs - medical: None  . Transportation needs - non-medical: None  Occupational History    Comment: [employer]  Tobacco Use  . Smoking status: Former Smoker    Packs/day: 1.00    Years: 40.00    Pack years: 40.00    Last attempt to quit: 12/03/2012    Years since quitting: 4.8  . Smokeless tobacco: Never Used  Substance and Sexual Activity  . Alcohol use: Yes    Alcohol/week: 1.2 oz    Types: 2 Standard drinks or equivalent per week    Comment: occ  . Drug use: No  . Sexual activity: None  Other Topics Concern  . None  Social History Narrative   Right handed, caffeine 3-4 cups daily, Married, 4 kids, 9 g kids., FT insurance agent (self employed). 4 yrs college    His Allergies Are:  Allergies  Allergen Reactions  . Bee Venom Anaphylaxis  . Meloxicam Anaphylaxis and Other (See Comments)    Also felt dizzy after taking medication.   . Lisinopril Other (See Comments)    Patient intolerant of drug which caused his tongue to tingle.  :   His Current Medications Are:  Outpatient Encounter Medications as of 09/22/2017  Medication Sig  . albuterol (PROVENTIL HFA;VENTOLIN HFA) 108 (90 BASE) MCG/ACT inhaler Inhale 1 puff into the lungs every 6  (six) hours as needed for wheezing or shortness of breath. prn  . Cholecalciferol (VITAMIN D PO) Take 5,000 Units by mouth 3 (three) times daily.  . Diclofenac Sodium (PENNSAID) 2 % SOLN Place 2 Squirts onto the skin 2 (two) times daily.  . diclofenac sodium (VOLTAREN) 1 % GEL Apply 2 g topically 4 (four) times daily.  Marland Kitchen EPINEPHrine 0.3 mg/0.3 mL IJ SOAJ injection Inject 0.3 mg into the muscle once. prn  . fenofibrate micronized (LOFIBRA) 134 MG capsule TAKE 1 CAPSULE BY MOUTH ONCE DAILY BEFORE  BREAKFAST  FOR  BLOOD  FATS  . HYDROcodone-acetaminophen (NORCO/VICODIN) 5-325 MG tablet Take 1/2 to 1 tablet every 3 to 4 hours if needed for cough or pain  . losartan-hydrochlorothiazide (HYZAAR) 100-25 MG tablet Take 1 tablet by mouth daily.  . methylPREDNISolone (MEDROL) 4 MG tablet Take 1 tablet (4 mg total) daily by mouth.  . metroNIDAZOLE (METROCREAM) 0.75 % cream Apply topically 2 (two) times daily.  . montelukast (SINGULAIR) 10 MG tablet Take 10 mg by mouth as needed (for seasonal allergies).  . montelukast (SINGULAIR) 10 MG tablet TAKE ONE TABLET BY MOUTH AT BEDTIME  . OVER THE COUNTER MEDICATION Reported on 10/24/2015  . phentermine (ADIPEX-P) 37.5 MG tablet TAKE 1 TABLET EVERY MORNING AND 1/2 TABLET AT 2PM FOR DIETING AND WEIGHT LOSS.  . ranitidine (ZANTAC) 300 MG tablet Take 1 to 2 tablets daily for heartburn & reflux to allow wean and transition from PPI / pantoprazole  . sertraline (ZOLOFT) 50 MG tablet Take 50 mg by mouth daily. Takes 1/2 tab (25 mg) daily  . sildenafil (REVATIO) 20 MG tablet Take 1 to 5 tabs daily as needed for XXXX  . traMADol (ULTRAM) 50 MG tablet Take 1 tablet every 4 hrs if needed for pain  . diphenhydrAMINE (BENADRYL) 25 mg capsule Take 1 capsule (25 mg total) by mouth every 8 (eight) hours as needed for itching (rash).  . famotidine (PEPCID) 40 MG tablet Take 1 tablet (40 mg total) by mouth daily.   No facility-administered encounter medications on file as of  09/22/2017.   :  Review of Systems:  Out of a complete 14 point review of systems, all are reviewed  and negative with the exception of these symptoms as listed below: Review of Systems  Neurological:       Pt presents today to discuss his cpap. Pt says that he is still having trouble sleeping.    Objective:  Neurological Exam  Physical Exam Physical Examination:   Vitals:   09/22/17 1304  BP: (!) 149/81  Pulse: 73   General Examination: The patient is a very pleasant 61 y.o. male in no acute distress. He appears well-developed and well-nourished and well groomed.   HEENT: Normocephalic, atraumatic, pupils are equal, round and reactive to light and accommodation. Extraocular tracking is good without limitation to gaze excursion or nystagmus noted. Corrective eye glasses. Normal smooth pursuit is noted. Hearing is grossly intact. Face is symmetric with normal facial animation and normal facial sensation. Speech is clear with no dysarthria noted. There is no hypophonia. There is no lip, neck/head, jaw or voice tremor. Neck is supple with full range of passive and active motion. There are no carotid bruits on auscultation. Oropharynx exam reveals: moderate mouth dryness, adequate dental hygiene and moderate airway crowding. Mallampati is class II. Tongue protrudes centrally and palate elevates symmetrically. Tonsils are absent.   Chest: Clear to auscultation without wheezing, rhonchi or crackles noted.  Heart: S1+S2+0, regular and normal without murmurs, rubs or gallops noted.   Abdomen: Soft, non-tender and non-distended with normal bowel sounds appreciated on auscultation.  Extremities: There is no pitting edema in the distal lower extremities bilaterally. Pedal pulses are intact.  Skin: Warm and dry without trophic changes noted.   Musculoskeletal: exam reveals mild bilateral knee pain, bilateral shoulder pain and arthritic changes in both hands.   Neurologically:  Mental  status: The patient is awake, alert and oriented in all 4 spheres. His immediate and remote memory, attention, language skills and fund of knowledge are appropriate. There is no evidence of aphasia, agnosia, apraxia or anomia. Speech is clear with normal prosody and enunciation. Thought process is linear. Mood is normal and affect is normal.  Cranial nerves II - XII are as described above under HEENT exam. In addition: shoulder shrug is normal with equal shoulder height noted. Motor exam: Normal bulk, strength and tone is noted. There is no drift, tremor or rebound. Reflexes are 1+ throughout. Fine motor skills and coordination: intact grossly.   Cerebellar testing: No dysmetria or intention tremor. There is no truncal or gait ataxia.   Sensory exam: intact to light touch in the upper and lower extremities.  Gait, station and balance: He stands easily. No veering to one side is noted. No leaning to one side is noted. Posture is age-appropriate and stance is narrow based. Gait shows normal stride length and normal pace. No problems turning are noted.             Assessment and Plan:   In summary, JAVYN HAVLIN is a very pleasant 61 year old male with an underlying medical  history of cervical and lumbar degenerative spine disease, anxiety, depression, asthma, hypertension, obesity, and history of cluster headaches who presents for follow-up consultation for his sleep apnea. He is well treated with BiPAP. His compliance is 100% at this time. He still struggles with sleep disruption mostly secondary to pain and nocturia. He is encouraged to see his urologist again. He is also encouraged to see orthopedics again. He has not tried melatonin. He is advised to try melatonin at night to help perhaps consolidate sleep. He is up-to-date with his supplies for BiPAP. I  did place a new supply order which we will fax to his DME company. Physical exam is stable with the exception of some weight fluctuation. He is  commended for his treatment adherence. He is encouraged to keep working on weight loss.  I spent 25 minutes in total face-to-face time with the patient, more than 50% of which was spent in counseling and coordination of care, reviewing test results, reviewing medication and discussing or reviewing the diagnosis of OSA, its prognosis and treatment options. Pertinent laboratory and imaging test results that were available during this visit with the patient were reviewed by me and considered in my medical decision making (see chart for details).

## 2017-09-22 NOTE — Patient Instructions (Addendum)
Keep up the good work with your BiPAP!  We will see you back in one year.   You can try Melatonin at night for sleep: take 1 mg to 3 mg, one to 2 hours before your bedtime. You can go up to 5 mg if needed. It is over the counter and comes in pill form, chewable form and spray, if you prefer.

## 2017-10-15 ENCOUNTER — Other Ambulatory Visit: Payer: Self-pay | Admitting: Internal Medicine

## 2017-10-15 ENCOUNTER — Other Ambulatory Visit: Payer: Self-pay | Admitting: Physician Assistant

## 2017-10-15 DIAGNOSIS — K0530 Chronic periodontitis, unspecified: Secondary | ICD-10-CM

## 2017-10-29 ENCOUNTER — Ambulatory Visit: Payer: No Typology Code available for payment source | Admitting: Internal Medicine

## 2017-10-29 VITALS — BP 140/78 | HR 64 | Temp 98.1°F | Resp 18 | Ht 69.5 in | Wt 242.4 lb

## 2017-10-29 DIAGNOSIS — M159 Polyosteoarthritis, unspecified: Secondary | ICD-10-CM

## 2017-10-29 DIAGNOSIS — I10 Essential (primary) hypertension: Secondary | ICD-10-CM | POA: Diagnosis not present

## 2017-10-29 DIAGNOSIS — R7303 Prediabetes: Secondary | ICD-10-CM | POA: Diagnosis not present

## 2017-10-29 DIAGNOSIS — Z79899 Other long term (current) drug therapy: Secondary | ICD-10-CM | POA: Diagnosis not present

## 2017-10-29 DIAGNOSIS — E782 Mixed hyperlipidemia: Secondary | ICD-10-CM

## 2017-10-29 DIAGNOSIS — L719 Rosacea, unspecified: Secondary | ICD-10-CM

## 2017-10-29 DIAGNOSIS — E559 Vitamin D deficiency, unspecified: Secondary | ICD-10-CM | POA: Diagnosis not present

## 2017-10-29 MED ORDER — CELECOXIB 200 MG PO CAPS: ORAL_CAPSULE | ORAL | 3 refills | Status: DC

## 2017-10-29 MED ORDER — DOXYCYCLINE HYCLATE 100 MG PO CAPS: ORAL_CAPSULE | ORAL | 3 refills | Status: DC

## 2017-10-29 NOTE — Progress Notes (Signed)
This very nice 62 y.o. MWM presents for 6 month follow up with Hypertension, Hyperlipidemia, Pre-Diabetes, Obesity, GERD and Vitamin D Deficiency.  Patient has Rosacea and is concerned that it's not controlled w/Metroderm cream.      Patient is treated for HTN (2015)  & BP has been controlled at home. Today's BP is borderline high normal today at 140/78. Patient has had no complaints of any cardiac type chest pain, palpitations, dyspnea / orthopnea / PND, dizziness, claudication, or dependent edema.     Hyperlipidemia is controlled with diet & meds. Patient denies myalgias or other med SE's. Last Lipids were at goal a;lbeit elevated Trig's:  Lab Results  Component Value Date   CHOL 138 10/29/2017   HDL 29 (L) 10/29/2017   LDLCALC 80 02/22/2017   TRIG 190 (H) 10/29/2017   CHOLHDL 4.8 10/29/2017      Also, the patient has history of Morbid Obesity (BMI 35+) and PreDiabetes & Insulin Resistance  and has had no symptoms of reactive hypoglycemia, diabetic polys, paresthesias or visual blurring.  Last A1c was Normal at goal: Lab Results  Component Value Date   HGBA1C 5.4 10/29/2017      Further, the patient also has history of Vitamin D Deficiency and supplements vitamin D without any suspected side-effects. Last vitamin D was   Lab Results  Component Value Date   VD25OH 32 10/29/2017   Current Outpatient Medications on File Prior to Visit  Medication Sig  . Cholecalciferol (VITAMIN D PO) Take 5,000 Units by mouth 3 (three) times daily.  . Diclofenac Sodium (PENNSAID) 2 % SOLN Place 2 Squirts onto the skin 2 (two) times daily.  . diclofenac sodium (VOLTAREN) 1 % GEL Apply 2 g topically 4 (four) times daily.  Marland Kitchen EPINEPHrine 0.3 mg/0.3 mL IJ SOAJ injection Inject 0.3 mg into the muscle once. prn  . fenofibrate micronized (LOFIBRA) 134 MG capsule TAKE 1 CAPSULE BY MOUTH ONCE DAILY BEFORE  BREAKFAST  FOR  BLOOD  FATS  . losartan-hydrochlorothiazide (HYZAAR) 100-25 MG tablet Take 1 tablet by  mouth daily.  . metroNIDAZOLE (METROCREAM) 0.75 % cream Apply topically 2 (two) times daily.  . montelukast (SINGULAIR) 10 MG tablet TAKE ONE TABLET BY MOUTH AT BEDTIME  . ranitidine (ZANTAC) 300 MG tablet Take 1 to 2 tablets daily for heartburn & reflux to allow wean and transition from PPI / pantoprazole  . sertraline (ZOLOFT) 50 MG tablet TAKE 1 TABLET BY MOUTH ONCE DAILY  . traMADol (ULTRAM) 50 MG tablet Take by mouth 4 (four) times daily.  . VENTOLIN HFA 108 (90 Base) MCG/ACT inhaler INHALE ONE PUFF BY MOUTH EVERY 6 HOURS AS NEEDED FOR SHORTNESS OF BREATH AND FOR WHEEZING  . phentermine (ADIPEX-P) 37.5 MG tablet TAKE 1 TABLET EVERY MORNING AND 1/2 TABLET AT 2PM FOR DIETING AND WEIGHT LOSS. (Patient not taking: Reported on 10/29/2017)  . sildenafil (REVATIO) 20 MG tablet Take 1 to 5 tabs daily as needed for XXXX (Patient not taking: Reported on 10/29/2017)   No current facility-administered medications on file prior to visit.    Allergies  Allergen Reactions  . Bee Venom Anaphylaxis  . Meloxicam Anaphylaxis and Other (See Comments)    Also felt dizzy after taking medication.   . Lisinopril Other (See Comments)    Patient intolerant of drug which caused his tongue to tingle.   PMHx:   Past Medical History:  Diagnosis Date  . Arthritis   . Environmental allergies   . OSA  on CPAP 08/01/2014  . Reflux    Immunization History  Administered Date(s) Administered  . Influenza Inj Mdck Quad With Preservative 08/19/2017  . Influenza Split 08/06/2015  . Influenza,inj,quad, With Preservative 08/20/2016  . Pneumococcal Polysaccharide-23 08/20/2016  . Tdap 11/06/2016  . Zoster 11/06/2016   Past Surgical History:  Procedure Laterality Date  . APPENDECTOMY  07/2012  . HEMORRHOID SURGERY  09/2012   FHx:    Reviewed / unchanged  SHx:    Reviewed / unchanged  Systems Review:  Constitutional: Denies fever, chills, wt changes, headaches, insomnia, fatigue, night sweats, change in  appetite. Eyes: Denies redness, blurred vision, diplopia, discharge, itchy, watery eyes.  ENT: Denies discharge, congestion, post nasal drip, epistaxis, sore throat, earache, hearing loss, dental pain, tinnitus, vertigo, sinus pain, snoring.  CV: Denies chest pain, palpitations, irregular heartbeat, syncope, dyspnea, diaphoresis, orthopnea, PND, claudication or edema. Respiratory: denies cough, dyspnea, DOE, pleurisy, hoarseness, laryngitis, wheezing.  Gastrointestinal: Denies dysphagia, odynophagia, heartburn, reflux, water brash, abdominal pain or cramps, nausea, vomiting, bloating, diarrhea, constipation, hematemesis, melena, hematochezia  or hemorrhoids. Genitourinary: Denies dysuria, frequency, urgency, nocturia, hesitancy, discharge, hematuria or flank pain. Musculoskeletal: Denies  myalgias, stiffness, jt. swelling, pain, limping or strain/sprain.Does c/o near disabling arthralgias of hands/fingers. Skin: Denies pruritus, rash, hives, warts, acne, eczema or change in skin lesion(s). Neuro: No weakness, tremor, incoordination, spasms, paresthesia or pain. Psychiatric: Denies confusion, memory loss or sensory loss. Endo: Denies change in weight, skin or hair change.  Heme/Lymph: No excessive bleeding, bruising or enlarged lymph nodes.  Physical Exam  BP 140/78   Pulse 64   Temp 98.1 F (36.7 C)   Resp 18   Ht 5' 9.5" (1.765 m)   Wt 242 lb 6.4 oz (110 kg)   BMI 35.28 kg/m   Appears Over nourished, well groomed  and in no distress.  Eyes: PERRLA, EOMs, conjunctiva no swelling or erythema. Sinuses: No frontal/maxillary tenderness ENT/Mouth: EAC's clear, TM's nl w/o erythema, bulging. Nares clear w/o erythema, swelling, exudates. Oropharynx clear without erythema or exudates. Oral hygiene is good. Tongue normal, non obstructing. Hearing intact.  Neck: Supple. Thyroid nl. Car 2+/2+ without bruits, nodes or JVD. Chest: Respirations nl with BS clear & equal w/o rales, rhonchi, wheezing  or stridor.  Cor: Heart sounds normal w/ regular rate and rhythm without sig. murmurs, gallops, clicks or rubs. Peripheral pulses normal and equal  without edema.  Abdomen: Soft & bowel sounds normal. Non-tender w/o guarding, rebound, hernias, masses or organomegaly.  Lymphatics: Unremarkable.  Musculoskeletal: Full ROM all peripheral extremities, joint stability, 5/5 strength and normal gait.  Skin: Warm, dry without exposed rashes, lesions or ecchymosis apparent.  Neuro: Cranial nerves intact, reflexes equal bilaterally. Sensory-motor testing grossly intact. Tendon reflexes grossly intact.  Pysch: Alert & oriented x 3.  Insight and judgement nl & appropriate. No ideations.  Assessment and Plan:  1. Essential hypertension  - Continue medication, monitor blood pressure at home.  - Continue DASH diet. Reminder to go to the ER if any CP,  SOB, nausea, dizziness, severe HA, changes vision/speech.  - CBC with Differential/Platelet - BASIC METABOLIC PANEL WITH GFR - Magnesium - TSH  2. Hyperlipidemia, mixed  - Continue diet/meds, exercise,& lifestyle modifications.  - Continue monitor periodic cholesterol/liver & renal functions   - Hepatic function panel - Lipid panel - TSH  3. Prediabetes  - Hemoglobin A1c - Insulin, random  4. Vitamin D deficiency  - Continue diet, exercise, lifestyle modifications.  - Monitor appropriate labs. - Continue  supplementation. - VITAMIN D 25 Hydroxy (Vit-D Deficiency, Fractures)  5. Rosacea - Add - doxycycline (VIBRAMYCIN) 100 MG capsule; Take 1 capsule daily with food for inflammation & pain  Dispense: 90 capsule; Refill: 3  6. Osteoarthritis of multiple joints, unspecified osteoarthritis type  - celecoxib (CELEBREX) 200 MG capsule; Take 1 tablet daily with food for inflammation & pain  Dispense: 90 capsule; Refill: 3  7. Medication management  - CBC with Differential/Platelet - BASIC METABOLIC PANEL WITH GFR - Hepatic function  panel - Magnesium - Lipid panel - TSH - Hemoglobin A1c - Insulin, random - VITAMIN D 25 Hydroxyl        Discussed  regular exercise, BP monitoring, weight control to achieve/maintain BMI less than 25 and discussed med and SE's. Recommended labs to assess and monitor clinical status with further disposition pending results of labs. Over 30 minutes of exam, counseling, chart review was performed.

## 2017-10-29 NOTE — Patient Instructions (Addendum)

## 2017-10-31 ENCOUNTER — Encounter: Payer: Self-pay | Admitting: Internal Medicine

## 2017-11-01 LAB — HEPATIC FUNCTION PANEL
AG Ratio: 1.7 (calc) (ref 1.0–2.5)
ALKALINE PHOSPHATASE (APISO): 80 U/L (ref 40–115)
ALT: 36 U/L (ref 9–46)
AST: 22 U/L (ref 10–35)
Albumin: 4.5 g/dL (ref 3.6–5.1)
BILIRUBIN INDIRECT: 0.5 mg/dL (ref 0.2–1.2)
Bilirubin, Direct: 0.1 mg/dL (ref 0.0–0.2)
GLOBULIN: 2.7 g/dL (ref 1.9–3.7)
TOTAL PROTEIN: 7.2 g/dL (ref 6.1–8.1)
Total Bilirubin: 0.6 mg/dL (ref 0.2–1.2)

## 2017-11-01 LAB — CBC WITH DIFFERENTIAL/PLATELET
BASOS PCT: 0.7 %
Basophils Absolute: 39 cells/uL (ref 0–200)
EOS ABS: 99 {cells}/uL (ref 15–500)
EOS PCT: 1.8 %
HCT: 41 % (ref 38.5–50.0)
Hemoglobin: 14.5 g/dL (ref 13.2–17.1)
Lymphs Abs: 2057 cells/uL (ref 850–3900)
MCH: 32.2 pg (ref 27.0–33.0)
MCHC: 35.4 g/dL (ref 32.0–36.0)
MCV: 90.9 fL (ref 80.0–100.0)
MPV: 10.3 fL (ref 7.5–12.5)
Monocytes Relative: 9.1 %
Neutro Abs: 2805 cells/uL (ref 1500–7800)
Neutrophils Relative %: 51 %
PLATELETS: 236 10*3/uL (ref 140–400)
RBC: 4.51 10*6/uL (ref 4.20–5.80)
RDW: 12.3 % (ref 11.0–15.0)
TOTAL LYMPHOCYTE: 37.4 %
WBC mixed population: 501 cells/uL (ref 200–950)
WBC: 5.5 10*3/uL (ref 3.8–10.8)

## 2017-11-01 LAB — BASIC METABOLIC PANEL WITH GFR
BUN: 21 mg/dL (ref 7–25)
CO2: 31 mmol/L (ref 20–32)
Calcium: 9.3 mg/dL (ref 8.6–10.3)
Chloride: 102 mmol/L (ref 98–110)
Creat: 0.88 mg/dL (ref 0.70–1.25)
GFR, Est African American: 107 mL/min/{1.73_m2} (ref >=60)
GFR, Est Non African American: 92 mL/min/{1.73_m2} (ref >=60)
Glucose, Bld: 119 mg/dL — ABNORMAL HIGH (ref 65–99)
Potassium: 4.4 mmol/L (ref 3.5–5.3)
SODIUM: 139 mmol/L (ref 135–146)

## 2017-11-01 LAB — LIPID PANEL
CHOL/HDL RATIO: 4.8 (calc) (ref <5.0)
Cholesterol: 138 mg/dL (ref <200)
HDL: 29 mg/dL — AB (ref >40)
LDL CHOLESTEROL (CALC): 81 mg/dL
NON-HDL CHOLESTEROL (CALC): 109 mg/dL (ref <130)
TRIGLYCERIDES: 190 mg/dL — AB (ref <150)

## 2017-11-01 LAB — HEMOGLOBIN A1C
HEMOGLOBIN A1C: 5.4 %{Hb} (ref <5.7)
MEAN PLASMA GLUCOSE: 108 (calc)
eAG (mmol/L): 6 (calc)

## 2017-11-01 LAB — VITAMIN D 25 HYDROXY (VIT D DEFICIENCY, FRACTURES): VIT D 25 HYDROXY: 32 ng/mL (ref 30–100)

## 2017-11-01 LAB — TSH: TSH: 1.7 mIU/L (ref 0.40–4.50)

## 2017-11-01 LAB — MAGNESIUM: MAGNESIUM: 2.1 mg/dL (ref 1.5–2.5)

## 2017-11-01 LAB — INSULIN, RANDOM: Insulin: 28.1 u[IU]/mL — ABNORMAL HIGH (ref 2.0–19.6)

## 2017-11-11 ENCOUNTER — Encounter (INDEPENDENT_AMBULATORY_CARE_PROVIDER_SITE_OTHER): Payer: Self-pay

## 2017-11-11 ENCOUNTER — Other Ambulatory Visit: Payer: Self-pay | Admitting: Internal Medicine

## 2017-11-12 ENCOUNTER — Encounter (INDEPENDENT_AMBULATORY_CARE_PROVIDER_SITE_OTHER): Payer: Self-pay

## 2017-11-12 ENCOUNTER — Other Ambulatory Visit: Payer: Self-pay | Admitting: Internal Medicine

## 2017-11-12 MED ORDER — IBUPROFEN 800 MG PO TABS: ORAL_TABLET | ORAL | 2 refills | Status: DC

## 2017-11-14 ENCOUNTER — Other Ambulatory Visit: Payer: Self-pay | Admitting: Internal Medicine

## 2017-11-14 ENCOUNTER — Encounter (INDEPENDENT_AMBULATORY_CARE_PROVIDER_SITE_OTHER): Payer: Self-pay

## 2017-11-14 DIAGNOSIS — G894 Chronic pain syndrome: Secondary | ICD-10-CM

## 2017-11-14 MED ORDER — GABAPENTIN 100 MG PO CAPS: ORAL_CAPSULE | ORAL | 0 refills | Status: DC

## 2017-11-16 ENCOUNTER — Telehealth (INDEPENDENT_AMBULATORY_CARE_PROVIDER_SITE_OTHER): Payer: Self-pay | Admitting: Orthopaedic Surgery

## 2017-11-16 NOTE — Telephone Encounter (Signed)
Patient states he had an injection in his hand with [facility] and is wondering if we could see that, tell him which injection and let him know how much it might be with his insurance, [facility] to have that with Dr. Ninfa Linden at his appt on Monday. Please advise # [phone]

## 2017-11-16 NOTE — Telephone Encounter (Signed)
We can not see anything at [facility], he would need to check with his insurance and ask them how much a cortisone injection would be-I have no idea

## 2017-11-22 ENCOUNTER — Ambulatory Visit (INDEPENDENT_AMBULATORY_CARE_PROVIDER_SITE_OTHER): Payer: No Typology Code available for payment source | Admitting: Orthopaedic Surgery

## 2017-11-22 ENCOUNTER — Encounter (INDEPENDENT_AMBULATORY_CARE_PROVIDER_SITE_OTHER): Payer: Self-pay | Admitting: Orthopaedic Surgery

## 2017-11-22 DIAGNOSIS — G8929 Other chronic pain: Secondary | ICD-10-CM | POA: Insufficient documentation

## 2017-11-22 DIAGNOSIS — M25511 Pain in right shoulder: Secondary | ICD-10-CM

## 2017-11-22 DIAGNOSIS — M19042 Primary osteoarthritis, left hand: Secondary | ICD-10-CM | POA: Diagnosis not present

## 2017-11-22 DIAGNOSIS — M25512 Pain in left shoulder: Secondary | ICD-10-CM | POA: Diagnosis not present

## 2017-11-22 DIAGNOSIS — M65341 Trigger finger, right ring finger: Secondary | ICD-10-CM | POA: Diagnosis not present

## 2017-11-22 DIAGNOSIS — M19041 Primary osteoarthritis, right hand: Secondary | ICD-10-CM

## 2017-11-22 DIAGNOSIS — M19049 Primary osteoarthritis, unspecified hand: Secondary | ICD-10-CM

## 2017-11-22 MED ORDER — LIDOCAINE HCL 1 % IJ SOLN
1.0000 mL | INTRAMUSCULAR | Status: AC | PRN
  Administered 2017-11-22: 1 mL

## 2017-11-22 MED ORDER — METHYLPREDNISOLONE ACETATE 40 MG/ML IJ SUSP
40.0000 mg | INTRAMUSCULAR | Status: AC | PRN
  Administered 2017-11-22: 40 mg

## 2017-11-22 NOTE — Progress Notes (Signed)
Office Visit Note   Patient: George Montgomery           Date of Birth: [birth year]           MRN: [id number] Visit Date: 11/22/2017              Requested by: Unk Pinto, [place] [address] [place] [address], [place] [id number] PCP: Unk Pinto, MD   Assessment & Plan: Visit Diagnoses:  1. Hand arthritis   2. Trigger finger, right ring finger   3. Chronic pain of both shoulders     Plan: Since the patient has some medial complaints is not diabetic on file with providing multiple injections today.  He understands fully how this may affect his body and blood sugars.  I was able place bilateral subacromial steroid injections in the shoulders.  Also bilateral injections in the MCP joints dorsally of both index fingers and in the right ring finger A1 pulley.  All questions concerns were answered and addressed.  He will follow-up as needed.  His first treatment for the MCP joint arthritis of his hands I would need to send him to a hand surgeon if that becomes problematic and he understands this.  Follow-Up Instructions: Return if symptoms worsen or fail to improve.   Orders:  No orders of the defined types were placed in this encounter.  No orders of the defined types were placed in this encounter.     Procedures: Hand/UE Inj: bilateral index MCP for osteoarthritis on 11/22/2017 2:42 PM Medications (Right): 40 mg methylPREDNISolone acetate 40 MG/ML  Hand/UE Inj: R ring A1 for trigger finger on 11/22/2017 2:43 PM Medications: 1 mL lidocaine 1 %; 40 mg methylPREDNISolone acetate 40 MG/ML  Large Joint Inj: bilateral subacromial bursa on 11/22/2017 2:43 PM Indications: pain and diagnostic evaluation Details: 22 G 1.5 in needle  Arthrogram: No  Outcome: tolerated well, no immediate complications Procedure, treatment alternatives, risks and benefits explained, specific risks discussed. Consent was given by the patient. Immediately prior to procedure a time out was called to  verify the correct patient, procedure, equipment, support staff and site/side marked as required. Patient was prepped and draped in the usual sterile fashion.       Clinical Data: No additional findings.   Subjective: Chief Complaint  Patient presents with  . Left Hand - Pain  . Right Hand - Pain  The patient comes in today with chief complaint of bilateral shoulder pain as well as bilateral index finger pain at the MCP joints and triggering of the right ring finger.  Is not a diabetic and is requesting injections in all these areas today.  He said no other change in his medical status and is otherwise healthy.  He is not a diabetic.  We have injected his trigger on the right ring finger before.  HPI Currently denies any headache, chest pain, short of breath, fever, chills, nausea, vomiting. Review of Systems   Objective: Vital Signs: There were no vitals taken for this visit.  Physical Exam He is alert and oriented x3 in no acute distress Ortho Exam Examination of both hands shows pain at the MCP joint of his index finger on both sides.  He also has pain over the A1 pulley of the right ring finger with active triggering.  Both shoulders have full range of motion with a positive Neer and Hawkins signs.  I feel the rotator cuff is intact on both shoulders. Specialty Comments:  No specialty comments available.  Imaging: No results found.   PMFS History: Patient Active Problem List   Diagnosis Date Noted  . Depression 07/20/2017  . Trigger finger, right ring finger 04/07/2017  . Bilateral hand pain 04/07/2017  . Dyspnea 02/09/2017  . Prediabetes 08/06/2015  . Mixed hyperlipidemia 08/06/2015  . Medication management 08/06/2015  . BMI 35.73,  adult 08/06/2015  . Onychomycosis 08/06/2015  . HTN (hypertension) 11/06/2014  . Cluster headaches 11/06/2014  . OSA treated with BiPAP 11/06/2014  . Vitamin D deficiency 11/06/2014  . Testosterone deficiency 11/06/2014  . Morbid  obesity (BMI 35.85) 11/06/2014   Past Medical History:  Diagnosis Date  . Arthritis   . Environmental allergies   . OSA on CPAP 08/01/2014  . Reflux     Family History  Problem Relation Age of Onset  . Heart disease Mother   . Macular degeneration Mother   . Cancer - Colon Other   . Cancer - Prostate Other   . Stroke Maternal Aunt   . Cancer - Colon Paternal Uncle   . Cancer - Prostate Paternal Uncle     Past Surgical History:  Procedure Laterality Date  . APPENDECTOMY  07/2012  . HEMORRHOID SURGERY  09/2012   Social History   Occupational History    Comment: [employer]  Tobacco Use  . Smoking status: Former Smoker    Packs/day: 1.00    Years: 40.00    Pack years: 40.00    Last attempt to quit: 12/03/2012    Years since quitting: 4.9  . Smokeless tobacco: Never Used  Substance and Sexual Activity  . Alcohol use: Yes    Alcohol/week: 1.2 oz    Types: 2 Standard drinks or equivalent per week    Comment: occ  . Drug use: No  . Sexual activity: Not on file

## 2017-12-18 ENCOUNTER — Other Ambulatory Visit: Payer: Self-pay | Admitting: Internal Medicine

## 2017-12-23 ENCOUNTER — Encounter (INDEPENDENT_AMBULATORY_CARE_PROVIDER_SITE_OTHER): Payer: Self-pay

## 2018-01-04 ENCOUNTER — Other Ambulatory Visit: Payer: Self-pay | Admitting: Nurse Practitioner

## 2018-01-04 DIAGNOSIS — I10 Essential (primary) hypertension: Secondary | ICD-10-CM

## 2018-01-31 ENCOUNTER — Other Ambulatory Visit: Payer: Self-pay | Admitting: Nurse Practitioner

## 2018-01-31 DIAGNOSIS — I10 Essential (primary) hypertension: Secondary | ICD-10-CM

## 2018-02-07 ENCOUNTER — Ambulatory Visit: Payer: Self-pay | Admitting: Adult Health

## 2018-02-07 ENCOUNTER — Encounter: Payer: Self-pay | Admitting: Internal Medicine

## 2018-02-11 ENCOUNTER — Other Ambulatory Visit (INDEPENDENT_AMBULATORY_CARE_PROVIDER_SITE_OTHER): Payer: Self-pay

## 2018-02-11 ENCOUNTER — Encounter (INDEPENDENT_AMBULATORY_CARE_PROVIDER_SITE_OTHER): Payer: Self-pay | Admitting: Orthopaedic Surgery

## 2018-02-11 ENCOUNTER — Encounter: Payer: Self-pay | Admitting: Adult Health

## 2018-02-11 DIAGNOSIS — M19049 Primary osteoarthritis, unspecified hand: Secondary | ICD-10-CM

## 2018-02-14 ENCOUNTER — Ambulatory Visit: Payer: Self-pay | Admitting: Adult Health

## 2018-02-21 ENCOUNTER — Telehealth (INDEPENDENT_AMBULATORY_CARE_PROVIDER_SITE_OTHER): Payer: Self-pay | Admitting: Orthopaedic Surgery

## 2018-02-21 NOTE — Telephone Encounter (Signed)
Patient called asking for a copy of his xray since he was referred to a hand specialist. If he could get a phone call when those are ready to be picked up. CB # [phone]

## 2018-02-21 NOTE — Progress Notes (Signed)
FOLLOW UP  Assessment and Plan:   Hypertension Well controlled with current medications  Monitor blood pressure at home; patient to call if consistently greater than 130/80 Continue DASH diet.   Reminder to go to the ER if any CP, SOB, nausea, dizziness, severe HA, changes vision/speech, left arm numbness and tingling and jaw pain.  Cholesterol Currently LDL at goal; continue fenofibrate for trigs; diet for triglycerides discussed - advised to cut down on wine intake (drinks 4 glasses twice weekly) Continue low cholesterol diet and exercise.  Check lipid panel.   Other abnormal glucose Recent A1Cs at goal Discussed diet/exercise, weight management  Defer A1C; check BMP  Obesity with co morbidities Very long discussion about weight loss, diet, and exercise Recommended diet heavy in fruits and veggies and low in animal meats, cheeses, and dairy products, appropriate calorie intake Suggested tracking with app - Lose it! App suggested for calorie needs estimation and learning caloric content - suggested 4 weeks minimum with this Discussed exercise is for general health - not to focus on calories burned - diet is primary Intermittent fasting discussed  Discussed ideal weight for height and initial weight goal (220 lb) Will follow up in 3 months  Vitamin D Def Below goal at last visit; has been off of supplement - he is agreeable to restart at 5000 IU daily Defer vit D level to next visit  Continue diet and meds as discussed. Further disposition pending results of labs. Discussed med's effects and SE's.   Over 30 minutes of exam, counseling, chart review, and critical decision making was performed.   Future Appointments  Date Time Provider [place]  05/09/2018  3:45 PM Unk Pinto, George Montgomery [facility] None  09/26/2018  9:30 AM Star Age, George Montgomery [facility] None     ----------------------------------------------------------------------------------------------------------------------  HPI 62 y.o. male  presents for 3 month follow up on hypertension, cholesterol, glucose management, weight and vitamin D deficiency. He is struggling with severe bilateral hand pain for which has seen Dr. Ninfa Linden, but has been referred on to see a hand specialist this week. He is requesting a short term refill for pain medication; previously has been prescribed 30 tabs which lasted nearly a year. Discussed new guidelines, will only provide very small amount to tide him over and then will no longer provide opioids for hand pain and defer to hand specialist management.   He has stopped sertraline; reports he started taking hemp oil drops and feels much better controlled with this and without SE he was experiencing with SSRI.    He does endorse insomnia; difficulty staying asleep and falling back asleep after awakening during the night. Wears CPAP at night, wife sleeps with TV on. Has not tried any OTC medications. Sleep hygiene discussed at length; patient declines prescription medication at this time.   BMI is Body mass index is 34.79 kg/m., he has been working on diet and exercise, though recently limited by hand pain.  Wt Readings from Last 3 Encounters:  02/22/18 239 lb (108.4 kg)  10/29/17 242 lb 6.4 oz (110 kg)  09/22/17 242 lb (109.8 kg)   His blood pressure has been controlled at home, today their BP is BP: 118/66  He does workout. He denies chest pain, shortness of breath, dizziness.   He is on cholesterol medication (fenofibrate) and denies myalgias. His cholesterol is not at goal. The cholesterol last visit was:   Lab Results  Component Value Date   CHOL 138 10/29/2017   HDL 29 (L) 10/29/2017  LDLCALC 81 10/29/2017   TRIG 190 (H) 10/29/2017   CHOLHDL 4.8 10/29/2017    He has been working on diet and exercise for glucose management (hx of prediabetes), and  denies foot ulcerations, increased appetite, nausea, paresthesia of the feet, polydipsia, polyuria, visual disturbances, vomiting and weight loss. Last A1C in the office was:  Lab Results  Component Value Date   HGBA1C 5.4 10/29/2017   Patient is not currently on vitamin D   Lab Results  Component Value Date   VD25OH 32 10/29/2017        Current Medications:  Current Outpatient Medications on File Prior to Visit  Medication Sig  . diclofenac sodium (VOLTAREN) 1 % GEL Apply 2 g topically 4 (four) times daily.  Marland Kitchen doxycycline (VIBRAMYCIN) 100 MG capsule Take 1 capsule daily with food for inflammation & pain  . EPINEPHrine 0.3 mg/0.3 mL IJ SOAJ injection Inject 0.3 mg into the muscle once. prn  . fenofibrate micronized (LOFIBRA) 134 MG capsule TAKE 1 CAPSULE BY MOUTH ONCE DAILY BEFORE BREAKFAST FOR  BLOOD  FATS  . ibuprofen (ADVIL,MOTRIN) 800 MG tablet Take 1 tablet 2 to 3 x / day for pain & inflammation  . losartan-hydrochlorothiazide (HYZAAR) 100-25 MG tablet TAKE 1 TABLET BY MOUTH ONCE DAILY  . metroNIDAZOLE (METROCREAM) 0.75 % cream Apply topically 2 (two) times daily.  . montelukast (SINGULAIR) 10 MG tablet TAKE ONE TABLET BY MOUTH AT BEDTIME  . phentermine (ADIPEX-P) 37.5 MG tablet TAKE 1 TABLET EVERY MORNING AND 1/2 TABLET AT 2PM FOR DIETING AND WEIGHT LOSS.  . ranitidine (ZANTAC) 300 MG tablet Take 1 to 2 tablets daily for heartburn & reflux to allow wean and transition from PPI / pantoprazole  . sildenafil (REVATIO) 20 MG tablet Take 1 to 5 tabs daily as needed for XXXX  . VENTOLIN HFA 108 (90 Base) MCG/ACT inhaler INHALE ONE PUFF BY MOUTH EVERY 6 HOURS AS NEEDED FOR SHORTNESS OF BREATH AND FOR WHEEZING  . celecoxib (CELEBREX) 200 MG capsule Take 1 tablet daily with food for inflammation & pain  . Cholecalciferol (VITAMIN D PO) Take 5,000 Units by mouth 3 (three) times daily.  . Diclofenac Sodium (PENNSAID) 2 % SOLN Place 2 Squirts onto the skin 2 (two) times daily. (Patient not  taking: Reported on 02/22/2018)  . gabapentin (NEURONTIN) 100 MG capsule Take 1 capsule 3 x / day for pain (Patient not taking: Reported on 02/22/2018)   No current facility-administered medications on file prior to visit.      Allergies:  Allergies  Allergen Reactions  . Bee Venom Anaphylaxis  . Lisinopril Other (See Comments)    Patient intolerant of drug which caused his tongue to tingle.  . Celebrex [Celecoxib]      Medical History:  Past Medical History:  Diagnosis Date  . Arthritis   . Environmental allergies   . OSA on CPAP 08/01/2014  . Reflux    Family history- Reviewed and unchanged Social history- Reviewed and unchanged   Review of Systems:  Review of Systems  Constitutional: Negative for malaise/fatigue and weight loss.  HENT: Negative for hearing loss and tinnitus.   Eyes: Negative for blurred vision and double vision.  Respiratory: Negative for cough, shortness of breath and wheezing.   Cardiovascular: Negative for chest pain, palpitations, orthopnea, claudication and leg swelling.  Gastrointestinal: Negative for abdominal pain, blood in stool, constipation, diarrhea, heartburn, melena, nausea and vomiting.  Genitourinary: Negative.   Musculoskeletal: Positive for joint pain (Bilateral hands). Negative for myalgias.  Skin: Negative for rash.  Neurological: Negative for dizziness, tingling, sensory change, weakness and headaches.  Endo/Heme/Allergies: Negative for polydipsia.  Psychiatric/Behavioral: Negative for depression, hallucinations and memory loss. The patient has insomnia. The patient is not nervous/anxious.   All other systems reviewed and are negative.   Physical Exam: BP 118/66   Pulse 66   Temp 97.7 F (36.5 C)   Wt 239 lb (108.4 kg)   SpO2 97%   BMI 34.79 kg/m  Wt Readings from Last 3 Encounters:  02/22/18 239 lb (108.4 kg)  10/29/17 242 lb 6.4 oz (110 kg)  09/22/17 242 lb (109.8 kg)   General Appearance: Well nourished, in no apparent  distress. Eyes: PERRLA, EOMs, conjunctiva no swelling or erythema Sinuses: No Frontal/maxillary tenderness ENT/Mouth: Ext aud canals clear, TMs without erythema, bulging. No erythema, swelling, or exudate on post pharynx.  Tonsils not swollen or erythematous. Hearing normal.  Neck: Supple, thyroid normal.  Respiratory: Respiratory effort normal, BS equal bilaterally without rales, rhonchi, wheezing or stridor.  Cardio: RRR with no MRGs. Brisk peripheral pulses without edema.  Abdomen: Soft, + BS.  Non tender, no guarding, rebound, hernias, masses. Lymphatics: Non tender without lymphadenopathy.  Musculoskeletal: ROM limited in bilateral hands, generalized bony enlargement of joints in bilateral hands, 5/5 strength, Normal gait Skin: Warm, dry without rashes, lesions, ecchymosis.  Neuro: Cranial nerves intact. No cerebellar symptoms.  Psych: Awake and oriented X 3, normal affect, Insight and Judgment appropriate.    George Ribas, NP 11:20 AM [facility]

## 2018-02-22 ENCOUNTER — Encounter: Payer: Self-pay | Admitting: Adult Health

## 2018-02-22 ENCOUNTER — Ambulatory Visit: Payer: No Typology Code available for payment source | Admitting: Adult Health

## 2018-02-22 VITALS — BP 118/66 | HR 66 | Temp 97.7°F | Wt 239.0 lb

## 2018-02-22 DIAGNOSIS — E782 Mixed hyperlipidemia: Secondary | ICD-10-CM | POA: Diagnosis not present

## 2018-02-22 DIAGNOSIS — M79641 Pain in right hand: Secondary | ICD-10-CM | POA: Diagnosis not present

## 2018-02-22 DIAGNOSIS — E559 Vitamin D deficiency, unspecified: Secondary | ICD-10-CM | POA: Diagnosis not present

## 2018-02-22 DIAGNOSIS — L719 Rosacea, unspecified: Secondary | ICD-10-CM

## 2018-02-22 DIAGNOSIS — G4700 Insomnia, unspecified: Secondary | ICD-10-CM | POA: Diagnosis not present

## 2018-02-22 DIAGNOSIS — M79642 Pain in left hand: Secondary | ICD-10-CM | POA: Diagnosis not present

## 2018-02-22 DIAGNOSIS — Z79899 Other long term (current) drug therapy: Secondary | ICD-10-CM

## 2018-02-22 DIAGNOSIS — R7309 Other abnormal glucose: Secondary | ICD-10-CM

## 2018-02-22 DIAGNOSIS — I10 Essential (primary) hypertension: Secondary | ICD-10-CM

## 2018-02-22 DIAGNOSIS — N529 Male erectile dysfunction, unspecified: Secondary | ICD-10-CM | POA: Diagnosis not present

## 2018-02-22 LAB — CBC WITH DIFFERENTIAL/PLATELET
Basophils Absolute: 50 cells/uL (ref 0–200)
Basophils Relative: 0.8 %
EOS ABS: 183 {cells}/uL (ref 15–500)
Eosinophils Relative: 2.9 %
HCT: 42.3 % (ref 38.5–50.0)
Hemoglobin: 15.1 g/dL (ref 13.2–17.1)
Lymphs Abs: 1940 cells/uL (ref 850–3900)
MCH: 31.9 pg (ref 27.0–33.0)
MCHC: 35.7 g/dL (ref 32.0–36.0)
MCV: 89.4 fL (ref 80.0–100.0)
MPV: 10.4 fL (ref 7.5–12.5)
Monocytes Relative: 8.7 %
NEUTROS PCT: 56.8 %
Neutro Abs: 3578 cells/uL (ref 1500–7800)
PLATELETS: 244 10*3/uL (ref 140–400)
RBC: 4.73 10*6/uL (ref 4.20–5.80)
RDW: 12.3 % (ref 11.0–15.0)
TOTAL LYMPHOCYTE: 30.8 %
WBC: 6.3 10*3/uL (ref 3.8–10.8)
WBCMIX: 548 {cells}/uL (ref 200–950)

## 2018-02-22 LAB — COMPLETE METABOLIC PANEL WITH GFR
AG RATIO: 1.5 (calc) (ref 1.0–2.5)
ALKALINE PHOSPHATASE (APISO): 77 U/L (ref 40–115)
ALT: 31 U/L (ref 9–46)
AST: 23 U/L (ref 10–35)
Albumin: 4.6 g/dL (ref 3.6–5.1)
BILIRUBIN TOTAL: 0.8 mg/dL (ref 0.2–1.2)
BUN: 17 mg/dL (ref 7–25)
CHLORIDE: 102 mmol/L (ref 98–110)
CO2: 27 mmol/L (ref 20–32)
Calcium: 9.5 mg/dL (ref 8.6–10.3)
Creat: 0.89 mg/dL (ref 0.70–1.25)
GFR, Est African American: 105 mL/min/{1.73_m2} (ref >=60)
GFR, Est Non African American: 91 mL/min/{1.73_m2} (ref >=60)
Globulin: 3.1 g/dL (calc) (ref 1.9–3.7)
Glucose, Bld: 99 mg/dL (ref 65–99)
POTASSIUM: 3.7 mmol/L (ref 3.5–5.3)
Sodium: 139 mmol/L (ref 135–146)
Total Protein: 7.7 g/dL (ref 6.1–8.1)

## 2018-02-22 LAB — LIPID PANEL
Cholesterol: 170 mg/dL (ref <200)
HDL: 32 mg/dL — AB (ref >40)
LDL Cholesterol (Calc): 93 mg/dL (calc)
Non-HDL Cholesterol (Calc): 138 mg/dL (calc) — ABNORMAL HIGH (ref <130)
TRIGLYCERIDES: 334 mg/dL — AB (ref <150)
Total CHOL/HDL Ratio: 5.3 (calc) — ABNORMAL HIGH (ref <5.0)

## 2018-02-22 LAB — TSH: TSH: 1.53 m[IU]/L (ref 0.40–4.50)

## 2018-02-22 MED ORDER — METRONIDAZOLE 0.75 % EX CREA: TOPICAL_CREAM | Freq: Two times a day (BID) | CUTANEOUS | 2 refills | Status: DC

## 2018-02-22 MED ORDER — SILDENAFIL CITRATE 20 MG PO TABS: ORAL_TABLET | ORAL | 1 refills | Status: AC

## 2018-02-22 MED ORDER — PHENTERMINE HCL 37.5 MG PO TABS: ORAL_TABLET | ORAL | 5 refills | Status: DC

## 2018-02-22 MED ORDER — HYDROCODONE-ACETAMINOPHEN 5-325 MG PO TABS: 1.0000 | ORAL_TABLET | Freq: Four times a day (QID) | ORAL | 0 refills | Status: DC | PRN

## 2018-02-22 MED ORDER — FENOFIBRATE MICRONIZED 134 MG PO CAPS: ORAL_CAPSULE | ORAL | 1 refills | Status: DC

## 2018-02-22 MED ORDER — DICLOFENAC SODIUM 1 % TD GEL: 2.0000 g | Freq: Four times a day (QID) | TRANSDERMAL | 3 refills | Status: DC

## 2018-02-22 NOTE — Telephone Encounter (Signed)
Can you do this sometime today?  I did not get to it yesterday, thank you

## 2018-02-22 NOTE — Telephone Encounter (Signed)
I made D for patient, I called and LMOM for patient to advise it was ready, no fee

## 2018-02-22 NOTE — Patient Instructions (Signed)
Try Lose it! App     When it comes to diets, agreement about the perfect plan isn't easy to find, even among the experts. Experts at the [employer] developed an idea known as the Healthy Eating Plate. Just imagine a plate divided into logical, healthy portions.  The emphasis is on diet quality:  Load up on vegetables and fruits - one-half of your plate: Aim for color and variety, and remember that potatoes don't count.  Go for whole grains - one-quarter of your plate: Whole wheat, barley, wheat berries, quinoa, oats, brown rice, and foods made with them. If you want pasta, go with whole wheat pasta.  Protein power - one-quarter of your plate: Fish, chicken, beans, and nuts are all healthy, versatile protein sources. Limit red meat.  The diet, however, does go beyond the plate, offering a few other suggestions.  Use healthy plant oils, such as olive, canola, soy, corn, sunflower and peanut. Check the labels, and avoid partially hydrogenated oil, which have unhealthy trans fats.  If you're thirsty, drink water. Coffee and tea are good in moderation, but skip sugary drinks and limit milk and dairy products to one or two daily servings.  The type of carbohydrate in the diet is more important than the amount. Some sources of carbohydrates, such as vegetables, fruits, whole grains, and beans-are healthier than others.  Finally, stay active.   Intermittent fasting is more about strategy than starvation. It's meant to reset your body in different ways, hopefully with fitness and nutrition changes as a result.  Like any big switchover, though, results may vary when it comes down to the individual level. What works for your friends may not work for you, or vice versa. That's why it's helpful to play around with variations on intermittent fasting and healthy habits and find what works best for you.  WHAT IS INTERMITTENT FASTING AND WHY DO IT?  Intermittent fasting doesn't  involve specific foods, but rather, a strict schedule regarding when you eat. Also called "time-restricted eating," the tactic has been praised for its contribution to weight loss, improved body composition, and decreased cravings. Preliminary research also suggests it may be beneficial for glucose tolerance, hormone regulation, better muscle mass and lower body fat.  Part of its appeal is the simplicity of the effort. Unlike some other trends, there's no calculations to intermittent fasting.  You simply eat within a certain block of time, usually a window of 8-10 hours. In the other big block of time - about 14-16 hours, including when you're asleep - you don't eat anything, not even snacks. You can drink water, coffee, tea or any other beverage that doesn't have calories.  For example, if you like having a late dinner, you might skip breakfast and have your first meal at noon and your last meal of the day at 8 p.m., and then not eat until noon again the next day.  IDEAS FOR GETTING STARTED  If you're new to the strategy, it may be helpful to eat within the typical circadian rhythm and keep eating within daylight hours. This can be especially beneficial if you're looking at intermittent fasting for weight-loss goals.  So first try only eating between 12pm to 8pm.  Outside of this time you may have water, black coffee, and hot tea. You may not eat it drink anything that has carbs, sugars, OR artificial sugars like diet soda.   Like any major eating and fitness shift, it can take time to  find the perfect fit, so don't be afraid to experiment with different options - including ditching intermittent fasting altogether if it's simply not for you. But if it is, you may be surprised by some of the benefits that come along with the strategy.    Can try melatonin 5mg -15 mg at night for sleep, can also do benadryl 25-50mg  at night for sleep.  If this does not help we can try prescription medication.    Also here is some information about good sleep hygiene.   Insomnia Insomnia is frequent trouble falling and/or staying asleep. Insomnia can be a long term problem or a short term problem. Both are common. Insomnia can be a short term problem when the wakefulness is related to a certain stress or worry. Long term insomnia is often related to ongoing stress during waking hours and/or poor sleeping habits. Overtime, sleep deprivation itself can make the problem worse. Every little thing feels more severe because you are overtired and your ability to cope is decreased. CAUSES   Stress, anxiety, and depression.  Poor sleeping habits.  Distractions such as TV in the bedroom.  Naps close to bedtime.  Engaging in emotionally charged conversations before bed.  Technical reading before sleep.  Alcohol and other sedatives. They may make the problem worse. They can hurt normal sleep patterns and normal dream activity.  Stimulants such as caffeine for several hours prior to bedtime.  Pain syndromes and shortness of breath can cause insomnia.  Exercise late at night.  Changing time zones may cause sleeping problems (jet lag). It is sometimes helpful to have someone observe your sleeping patterns. They should look for periods of not breathing during the night (sleep apnea). They should also look to see how long those periods last. If you live alone or observers are uncertain, you can also be observed at a sleep clinic where your sleep patterns will be professionally monitored. Sleep apnea requires a checkup and treatment. Give your caregivers your medical history. Give your caregivers observations your family has made about your sleep.  SYMPTOMS   Not feeling rested in the morning.  Anxiety and restlessness at bedtime.  Difficulty falling and staying asleep. TREATMENT   Your caregiver may prescribe treatment for an underlying medical disorders. Your caregiver can give advice or help if you  are using alcohol or other drugs for self-medication. Treatment of underlying problems will usually eliminate insomnia problems.  Medications can be prescribed for short time use. They are generally not recommended for lengthy use.  Over-the-counter sleep medicines are not recommended for lengthy use. They can be habit forming.  You can promote easier sleeping by making lifestyle changes such as:  Using relaxation techniques that help with breathing and reduce muscle tension.  Exercising earlier in the day.  Changing your diet and the time of your last meal. No night time snacks.  Establish a regular time to go to bed.  Counseling can help with stressful problems and worry.  Soothing music and white noise may be helpful if there are background noises you cannot remove.  Stop tedious detailed work at least one hour before bedtime. HOME CARE INSTRUCTIONS   Keep a diary. Inform your caregiver about your progress. This includes any medication side effects. See your caregiver regularly. Take note of:  Times when you are asleep.  Times when you are awake during the night.  The quality of your sleep.  How you feel the next day. This information will help your caregiver care for  you.  Get out of bed if you are still awake after 15 minutes. Read or do some quiet activity. Keep the lights down. Wait until you feel sleepy and go back to bed.  Keep regular sleeping and waking hours. Avoid naps.  Exercise regularly.  Avoid distractions at bedtime. Distractions include watching television or engaging in any intense or detailed activity like attempting to balance the household checkbook.  Develop a bedtime ritual. Keep a familiar routine of bathing, brushing your teeth, climbing into bed at the same time each night, listening to soothing music. Routines increase the success of falling to sleep faster.  Use relaxation techniques. This can be using breathing and muscle tension release  routines. It can also include visualizing peaceful scenes. You can also help control troubling or intruding thoughts by keeping your mind occupied with boring or repetitive thoughts like the old concept of counting sheep. You can make it more creative like imagining planting one beautiful flower after another in your backyard garden.  During your day, work to eliminate stress. When this is not possible use some of the previous suggestions to help reduce the anxiety that accompanies stressful situations. MAKE SURE YOU:   Understand these instructions.  Will watch your condition.  Will get help right away if you are not doing well or get worse. Document Released: 10/02/2000 Document Revised: 12/28/2011 Document Reviewed: 11/02/2007 [facility] Patient Information 2015 [place], [place]. This information is not intended to replace advice given to you by your health care provider. Make sure you discuss any questions you have with your health care provider.

## 2018-02-24 DIAGNOSIS — M19049 Primary osteoarthritis, unspecified hand: Secondary | ICD-10-CM | POA: Insufficient documentation

## 2018-03-11 ENCOUNTER — Other Ambulatory Visit: Payer: Self-pay | Admitting: Nurse Practitioner

## 2018-03-11 DIAGNOSIS — I10 Essential (primary) hypertension: Secondary | ICD-10-CM

## 2018-03-15 ENCOUNTER — Other Ambulatory Visit: Payer: Self-pay | Admitting: Internal Medicine

## 2018-03-17 ENCOUNTER — Encounter: Payer: Self-pay | Admitting: Internal Medicine

## 2018-03-24 ENCOUNTER — Telehealth: Payer: Self-pay

## 2018-03-24 ENCOUNTER — Other Ambulatory Visit: Payer: Self-pay | Admitting: Adult Health

## 2018-03-24 MED ORDER — ALPRAZOLAM 0.5 MG PO TABS: ORAL_TABLET | ORAL | 0 refills | Status: DC

## 2018-03-24 NOTE — Telephone Encounter (Signed)
Mother is expected to pass away in a few hours, and is struggling with anxiety. Asking for a temporary medication to help him through the next few days.

## 2018-03-25 NOTE — Telephone Encounter (Signed)
Left message to return call 

## 2018-03-26 ENCOUNTER — Other Ambulatory Visit: Payer: Self-pay | Admitting: Nurse Practitioner

## 2018-03-26 DIAGNOSIS — I10 Essential (primary) hypertension: Secondary | ICD-10-CM

## 2018-03-28 ENCOUNTER — Telehealth: Payer: Self-pay

## 2018-03-28 NOTE — Telephone Encounter (Addendum)
We received a PA request for Losartan/HCTZ from [facility]. Because it is uncommon to get a PA request for Losartan/HCTZ I called [facility] and was advised that this medication does not need a PA but that it needs to be refilled.  I advised them that we sent in a 15 (day supply) tablet refill on 03/11/18 with the following message: Please call our office to schedule an overdue yearly appt with Truitt Merle, NP before any more refills or refer to the pts PCP for this refill. 2nd attempt.  The pt has not scheduled a f/u at this time.  Also, [facility] states that a PA was generated because the pt requested this refill too soon but again a PA is not needed but a refill is.

## 2018-04-04 ENCOUNTER — Other Ambulatory Visit: Payer: Self-pay | Admitting: *Deleted

## 2018-04-04 ENCOUNTER — Telehealth: Payer: Self-pay | Admitting: Nurse Practitioner

## 2018-04-04 DIAGNOSIS — I10 Essential (primary) hypertension: Secondary | ICD-10-CM

## 2018-04-04 MED ORDER — LOSARTAN POTASSIUM-HCTZ 100-25 MG PO TABS: 1.0000 | ORAL_TABLET | Freq: Every day | ORAL | 1 refills | Status: DC

## 2018-04-04 NOTE — Telephone Encounter (Signed)
New message    *STAT* If patient is at the pharmacy, call can be transferred to refill team.   1. Which medications need to be refilled? (please list name of each medication and dose if known) losartan-hydrochlorothiazide (HYZAAR) 100-25 MG tablet  2. Which pharmacy/location (including street and city if local pharmacy) is medication to be sent to? [place], [address]  3. Do they need a 30 day or 90 day supply? [place]

## 2018-04-08 ENCOUNTER — Other Ambulatory Visit: Payer: Self-pay | Admitting: Adult Health

## 2018-04-08 ENCOUNTER — Encounter: Payer: Self-pay | Admitting: Adult Health

## 2018-04-08 MED ORDER — TIZANIDINE HCL 4 MG PO CAPS: 4.0000 mg | ORAL_CAPSULE | Freq: Three times a day (TID) | ORAL | 0 refills | Status: DC | PRN

## 2018-04-08 MED ORDER — PREDNISONE 20 MG PO TABS: ORAL_TABLET | ORAL | 0 refills | Status: DC

## 2018-04-11 ENCOUNTER — Telehealth: Payer: Self-pay | Admitting: *Deleted

## 2018-04-11 ENCOUNTER — Other Ambulatory Visit: Payer: Self-pay | Admitting: *Deleted

## 2018-04-11 MED ORDER — VALSARTAN-HYDROCHLOROTHIAZIDE 80-12.5 MG PO TABS: 1.0000 | ORAL_TABLET | Freq: Every day | ORAL | 0 refills | Status: DC

## 2018-04-11 NOTE — Telephone Encounter (Signed)
Rx has been sent in to pharmacy. Could you put orders in for BMET in one week?

## 2018-04-11 NOTE — Telephone Encounter (Signed)
Patient called and requested an alternative to the losartan-hctz as he was informed by [facility] that they do not have it in stock as the medication is on mfg backorder. I mentioned that he could have the rx transferred to a pharmacy that has it in stock vs changing to another medication but he has been without medication for several days and his bp has been elevated. Please advise. Thanks, MI

## 2018-04-11 NOTE — Telephone Encounter (Signed)
Received a message from pt re: Lisinopril/HCTZ on backorder at his pharmacy, and per Minday, CMA in the refill dept, pt didn't want to transfer rx to another pharmacy, he wanted to switch the medicine. Per Truitt Merle, NP, pt can switch to Diovan 80 / HCTZ 12.5 with BMET in 1 week. When pt was contacted, he was upset, per pt, 1) cause my mother is dying, 2) my medicine was cancelled. Pt was explained the office protocol, if you're not seen in a year, you have 2 warnings to make an appt and the 3 response, it is denied. Pt states his pharmacy never gave him a message. Pt stated to cancel his request, he would find him a new cardiologist. I asked the pt if he wanted me to cancel his request, he asked me to let him calm down, he will think about it and call back.  Pt understands that no medication has been or will be sent in until he contacts [place] back. He verbalized understanding.   Truitt Merle, NP has been made aware.

## 2018-04-11 NOTE — Telephone Encounter (Signed)
Can switch to Valsartan 80 mg a day (should be back on market) With HCTZ 12.5 mg a day - combination ok.  One month supply please.   BMET in one week.  Needs a visit - has not been seen since 12/2016.

## 2018-04-12 ENCOUNTER — Telehealth: Payer: Self-pay

## 2018-04-12 ENCOUNTER — Other Ambulatory Visit: Payer: Self-pay | Admitting: Internal Medicine

## 2018-04-12 ENCOUNTER — Telehealth: Payer: Self-pay | Admitting: *Deleted

## 2018-04-12 MED ORDER — GABAPENTIN 600 MG PO TABS: ORAL_TABLET | ORAL | 0 refills | Status: DC

## 2018-04-12 NOTE — Telephone Encounter (Signed)
Patient called and complained of severe sciatica pain. Per Dr Melford Aase, an RX for Gabapentin 600 mg 1/2 to 1 tablet was sent to the patent's pharmacy.  He was advised to pick up the RX for Prednisone that was sent to his pharmacy on 04/08/2018.

## 2018-04-12 NOTE — Telephone Encounter (Signed)
Pharmacy called to let you know that Prednisone 20mg  is on back order. Please advise

## 2018-04-13 NOTE — Telephone Encounter (Signed)
Could not reach patient via phone, will continue to try and call

## 2018-04-14 NOTE — Telephone Encounter (Signed)
Patient states that he is taking 10mg  and is doubling up. States that he still isn't any better, currently getting cramps in his legs. Will call on Monday to be seen if he is still not feeling better.

## 2018-05-09 ENCOUNTER — Encounter: Payer: Self-pay | Admitting: Internal Medicine

## 2018-05-10 NOTE — Progress Notes (Signed)
CARDIOLOGY OFFICE NOTE  Date:  05/11/2018    [name] Date of Birth: Aug 02, 1956 Medical Record #[id number]  PCP:  Unk Pinto, MD  Cardiologist:  Marisa Cyphers    Chief Complaint  Patient presents with  . Hypertension  . Hyperlipidemia    1 1/2 year check     History of Present Illness: George Montgomery is a 62 y.o. male who presents today for a 1 1/2 year absence. Seen for Dr. Marlou Porch (DOD) initially but has asked to follow with me. I see his wife as well.   He has a history of HTN, HLD, OSA, ED and obesity.   Evaluated last year at his request due to multiple CV risk factors and some shortness of breath. GXT was ok except for hypertensive response. He did not wish to have an echo. Taking chronic phentermine for weight loss. FH + for CAD. His father died at 30 with cirrhosis from alcoholism. Mom is alive at age 95 and has had heart failure, CAD with prior bypass. One brother alive with HTN. He is a former smoker - stopped about 4 or 5 years ago - started at the age of 18.  Comes in today. Here alone. He was out of for over 3 weeks. Now back on. No longer on his Phentermine. Weight is up a few pounds. He feels good. No chest pain. Not short of breath. Lots of stress - his mom is dying. Not really checking his BP at home. He feels like he is doing well overall.    Past Medical History:  Diagnosis Date  . Arthritis   . Environmental allergies   . OSA on CPAP 08/01/2014  . Reflux     Past Surgical History:  Procedure Laterality Date  . APPENDECTOMY  07/2012  . HEMORRHOID SURGERY  09/2012     Medications: Current Meds  Medication Sig  . ALPRAZolam (XANAX) 0.5 MG tablet Take 1/2-1 tab up to three times a day as needed for anxiety and sleep. Do not take concurrently with pain medication.  . Cholecalciferol (VITAMIN D PO) Take 5,000 Units by mouth 3 (three) times daily.  Marland Kitchen EPINEPHrine 0.3 mg/0.3 mL IJ SOAJ injection Inject 0.3 mg into the muscle once. prn    . fenofibrate micronized (LOFIBRA) 134 MG capsule TAKE 1 CAPSULE BY MOUTH ONCE DAILY BEFORE BREAKFAST FOR  BLOOD  FATS  . gabapentin (NEURONTIN) 600 MG tablet Take 1/2 to 1 tablet 3 to 4 x/ day for sciatic pain  . ibuprofen (ADVIL,MOTRIN) 800 MG tablet Take 1 tablet 2 to 3 x / day for pain & inflammation  . meloxicam (MOBIC) 15 MG tablet TAKE 1/2 TO 1 WHOLE TABLET BY MOUTH WITH FOOD FOR PAIN AND INFLAMMATION  . montelukast (SINGULAIR) 10 MG tablet TAKE ONE TABLET BY MOUTH AT BEDTIME  . ranitidine (ZANTAC) 300 MG tablet Take 1 to 2 tablets daily for heartburn & reflux to allow wean and transition from PPI / pantoprazole  . sildenafil (REVATIO) 20 MG tablet Take 1 to 5 tabs daily as needed for XXXX  . traMADol (ULTRAM) 50 MG tablet Take 50 mg by mouth every 6 (six) hours as needed for moderate pain.   . valsartan-hydrochlorothiazide (DIOVAN HCT) 80-12.5 MG tablet Take 1 tablet by mouth daily.  . VENTOLIN HFA 108 (90 Base) MCG/ACT inhaler INHALE ONE PUFF BY MOUTH EVERY 6 HOURS AS NEEDED FOR SHORTNESS OF BREATH AND FOR WHEEZING  . [DISCONTINUED] valsartan-hydrochlorothiazide (DIOVAN HCT) 80-12.5 MG  tablet Take 1 tablet by mouth daily.     Allergies: Allergies  Allergen Reactions  . Bee Venom Anaphylaxis  . Lisinopril Other (See Comments)    Patient intolerant of drug which caused his tongue to tingle.  . Celebrex [Celecoxib]     Social History: The patient  reports that he quit smoking about 5 years ago. He has a 40.00 pack-year smoking history. He has never used smokeless tobacco. He reports that he drinks about 1.2 oz of alcohol per week. He reports that he does not use drugs.   Family History: The patient's family history includes Cancer - Colon in his other and paternal uncle; Cancer - Prostate in his other and paternal uncle; Heart disease in his mother; Macular degeneration in his mother; Stroke in his maternal aunt.   Review of Systems: Please see the history of present illness.    Otherwise, the review of systems is positive for none.   All other systems are reviewed and negative.   Physical Exam: VS:  BP (!) 160/80 (BP Location: Left Arm, Patient Position: Sitting, Cuff Size: Normal)   Pulse 82   Ht 5\' 9"  (1.753 m)   Wt 245 lb 6.4 oz (111.3 kg)   BMI 36.24 kg/m  .  BMI Body mass index is 36.24 kg/m.  Wt Readings from Last 3 Encounters:  05/11/18 245 lb 6.4 oz (111.3 kg)  02/22/18 239 lb (108.4 kg)  10/29/17 242 lb 6.4 oz (110 kg)   Recheck BP by me is 130/68  General: Pleasant. Alert and in no acute distress.   HEENT: Normal.  Neck: Supple, no JVD, carotid bruits, or masses noted.  Cardiac: Regular rate and rhythm. No murmurs, rubs, or gallops. No edema.  Respiratory:  Lungs are clear to auscultation bilaterally with normal work of breathing.  GI: Soft and nontender.  MS: No deformity or atrophy. Gait and ROM intact.  Skin: Warm and dry. Color is normal.  Neuro:  Strength and sensation are intact and no gross focal deficits noted.  Psych: Alert, appropriate and with normal affect.   LABORATORY DATA:  EKG:  EKG is ordered today. This demonstrates NSR.  Lab Results  Component Value Date   WBC 6.3 02/22/2018   HGB 15.1 02/22/2018   HCT 42.3 02/22/2018   PLT 244 02/22/2018   GLUCOSE 99 02/22/2018   CHOL 170 02/22/2018   TRIG 334 (H) 02/22/2018   HDL 32 (L) 02/22/2018   LDLCALC 93 02/22/2018   ALT 31 02/22/2018   AST 23 02/22/2018   NA 139 02/22/2018   K 3.7 02/22/2018   CL 102 02/22/2018   CREATININE 0.89 02/22/2018   BUN 17 02/22/2018   CO2 27 02/22/2018   TSH 1.53 02/22/2018   PSA 0.9 02/22/2017   HGBA1C 5.4 10/29/2017   MICROALBUR <0.2 02/22/2017     BNP (last 3 results) No results for input(s): BNP in the last 8760 hours.  ProBNP (last 3 results) No results for input(s): PROBNP in the last 8760 hours.   Other Studies Reviewed Today:  GXT Study Highlights 01/2017    Blood pressure demonstrated a hypertensive response to  exercise.  There was no ST segment deviation noted during stress.  No T wave inversion was noted during stress.   Hypertensive response, otherwise normal ECG stress test.      Assessment/Plan:  1. HTN - BP recheck by me is 130/68 - I have left her on her current regimen.   2. HLD - on therapy.  He is having his physical with labs in about 2 weeks with his PCP.   3. OSA - on CPAP.   4. Obesity - discussed at length again today - he does seem motivated. Suggestions given today.   5. DOE - no longer endorsed  Current medicines are reviewed with the patient today.  The patient does not have concerns regarding medicines other than what has been noted above.  The following changes have been made:  See above.  Labs/ tests ordered today include:    Orders Placed This Encounter  Procedures  . EKG 12-Lead     Disposition:   FU with me in 1 year.   Patient is agreeable to this plan and will call if any problems develop in the interim.   SignedTruitt Merle, NP  05/11/2018 2:31 PM  [facility] [address] [place], [place]  [address] Phone: [phone] Fax: [phone]

## 2018-05-11 ENCOUNTER — Other Ambulatory Visit: Payer: Self-pay

## 2018-05-11 ENCOUNTER — Encounter: Payer: Self-pay | Admitting: Nurse Practitioner

## 2018-05-11 ENCOUNTER — Ambulatory Visit (INDEPENDENT_AMBULATORY_CARE_PROVIDER_SITE_OTHER): Payer: PRIVATE HEALTH INSURANCE | Admitting: Nurse Practitioner

## 2018-05-11 VITALS — BP 160/80 | HR 82 | Ht 69.0 in | Wt 245.4 lb

## 2018-05-11 DIAGNOSIS — E7800 Pure hypercholesterolemia, unspecified: Secondary | ICD-10-CM

## 2018-05-11 DIAGNOSIS — I10 Essential (primary) hypertension: Secondary | ICD-10-CM

## 2018-05-11 DIAGNOSIS — F32A Depression, unspecified: Secondary | ICD-10-CM

## 2018-05-11 DIAGNOSIS — G4733 Obstructive sleep apnea (adult) (pediatric): Secondary | ICD-10-CM | POA: Diagnosis not present

## 2018-05-11 DIAGNOSIS — F329 Major depressive disorder, single episode, unspecified: Secondary | ICD-10-CM

## 2018-05-11 MED ORDER — VALSARTAN-HYDROCHLOROTHIAZIDE 80-12.5 MG PO TABS: 1.0000 | ORAL_TABLET | Freq: Every day | ORAL | 3 refills | Status: DC

## 2018-05-11 MED ORDER — SERTRALINE HCL 50 MG PO TABS: 50.0000 mg | ORAL_TABLET | Freq: Every day | ORAL | 0 refills | Status: DC

## 2018-05-11 NOTE — Patient Instructions (Addendum)
We will be checking the following labs today - NONE   Medication Instructions:    Continue with your current medicines.     Testing/Procedures To Be Arranged:  N/A  Follow-Up:   See me in one year    Other Special Instructions:   Weight Watcher's  [facility]    If you need a refill on your cardiac medications before your next appointment, please call your pharmacy.   Call the [place] office at [phone] if you have any questions, problems or concerns.

## 2018-05-18 ENCOUNTER — Other Ambulatory Visit: Payer: Self-pay

## 2018-05-18 MED ORDER — ALPRAZOLAM 0.5 MG PO TABS: ORAL_TABLET | ORAL | 0 refills | Status: DC

## 2018-05-18 NOTE — Telephone Encounter (Signed)
Alprazolam refill request. PMP checked, last filled on 03/25/18, #30 for 10 days. Last office visit on 05/11/18, next office visit is on 06/03/18. In que for your review.

## 2018-06-02 ENCOUNTER — Encounter: Payer: Self-pay | Admitting: Internal Medicine

## 2018-06-02 DIAGNOSIS — R7303 Prediabetes: Secondary | ICD-10-CM | POA: Insufficient documentation

## 2018-06-02 DIAGNOSIS — K219 Gastro-esophageal reflux disease without esophagitis: Secondary | ICD-10-CM | POA: Insufficient documentation

## 2018-06-02 NOTE — Patient Instructions (Signed)

## 2018-06-02 NOTE — Progress Notes (Signed)
[facility]   Unk Pinto, M.D.     Uvaldo Bristle. Silverio Lay, P.A.-C Liane Comber, [place]                [address] [place] [address] Telephone [phone] Telefax [phone] Annual  Screening/Preventative Visit  & Comprehensive Evaluation & Examination     This very nice 62 y.o.  MWM presents for a Screening /Preventative Visit & comprehensive evaluation and management of multiple medical co-morbidities.  Patient has been followed for HTN, HLD, Prediabetes, Testosterone deficiency  and Vitamin D Deficiency. Patient has GERD controlled on his meds. Patient had evaluation in 2018 by Dr Elsworth Soho with Nl spirometry and std ETT.with his his dyspnea attributed to his weight and deconditioning. He has multiple Aches & pains and has bren recommended by Dr Burney Gauze to have surgery on his hands     HTN predates circa Dec 2015. Patient's BP has been controlled at home.  Today's BP is at goal -  118/80. Patient denies any cardiac symptoms as chest pain, palpitations, shortness of breath, dizziness or ankle swelling.     Patient's hyperlipidemia is controlled with diet and medications. Patient denies myalgias or other medication SE's. Last lipids were at goal albeit elevated Trig's:  Lab Results  Component Value Date   CHOL 170 02/22/2018   HDL 32 (L) 02/22/2018   LDLCALC 93 02/22/2018   TRIG 334 (H) 02/22/2018   CHOLHDL 5.3 (H) 02/22/2018      Patient has hx/o  Morbid Obesity (BMI 35+) and Prediabetes/Insulin Resistance (A1c 5.6%/elev. insulin 29. Jan 2016) and patient denies reactive hypoglycemic symptoms, visual blurring, diabetic polys or paresthesias. Last A1c was at goal: Lab Results  Component Value Date   HGBA1C 5.4 10/29/2017       Finally, patient has history of Vitamin D Deficiency("21"/2016) and last vitamin D was still very low: Lab Results  Component Value Date   VD25OH 32  10/29/2017   Current Outpatient Medications on File Prior to Visit  Medication Sig  . ALPRAZolam (XANAX) 0.5 MG tablet Take 1/2-1 tab up to three times a day as needed for anxiety and sleep. Do not take concurrently with pain medication.  . Cholecalciferol (VITAMIN D PO) Take 5,000 Units by mouth 3 (three) times daily.  Marland Kitchen EPINEPHrine 0.3 mg/0.3 mL IJ SOAJ injection Inject 0.3 mg into the muscle once. prn  . fenofibrate micronized (LOFIBRA) 134 MG capsule TAKE 1 CAPSULE BY MOUTH ONCE DAILY BEFORE BREAKFAST FOR  BLOOD  FATS  . gabapentin (NEURONTIN) 600 MG tablet Take 1/2 to 1 tablet 3 to 4 x/ day for sciatic pain  . ibuprofen (ADVIL,MOTRIN) 800 MG tablet Take 1 tablet 2 to 3 x / day for pain & inflammation  . meloxicam (MOBIC) 15 MG tablet TAKE 1/2 TO 1 WHOLE TABLET BY MOUTH WITH FOOD FOR PAIN AND INFLAMMATION  . montelukast (SINGULAIR) 10 MG tablet TAKE ONE TABLET BY MOUTH AT BEDTIME  . ranitidine (ZANTAC) 300 MG tablet Take 1 to 2 tablets daily for heartburn & reflux to allow wean and transition from PPI / pantoprazole  . sertraline (ZOLOFT) 50 MG tablet Take 1 tablet (50 mg total) by mouth daily.  . sildenafil (REVATIO) 20 MG tablet Take 1 to 5 tabs daily as needed for XXXX  . traMADol (ULTRAM) 50 MG tablet Take 50 mg by mouth every 6 (  six) hours as needed for moderate pain.   . valsartan-hydrochlorothiazide (DIOVAN HCT) 80-12.5 MG tablet Take 1 tablet by mouth daily.  . VENTOLIN HFA 108 (90 Base) MCG/ACT inhaler INHALE ONE PUFF BY MOUTH EVERY 6 HOURS AS NEEDED FOR SHORTNESS OF BREATH AND FOR WHEEZING   No current facility-administered medications on file prior to visit.    Allergies  Allergen Reactions  . Bee Venom Anaphylaxis  . Lisinopril Other (See Comments)    Patient intolerant of drug which caused his tongue to tingle.  . Celebrex [Celecoxib]    Past Medical History:  Diagnosis Date  . Arthritis   . Environmental allergies   . OSA on CPAP 08/01/2014  . Reflux    Health  Maintenance  Topic Date Due  . Hepatitis C Screening  1956-06-16  . HIV Screening  01/18/1970  . INFLUENZA VACCINE  05/19/2018  . COLONOSCOPY  02/13/2020  . TETANUS/TDAP  11/06/2026   Immunization History  Administered Date(s) Administered  . Influenza Inj Mdck Quad With Preservative 08/19/2017  . Influenza Split 08/06/2015  . Influenza,inj,quad, With Preservative 08/20/2016  . Pneumococcal Polysaccharide-23 08/20/2016  . Tdap 11/06/2016  . Zoster 11/06/2016   Last Colon - 02/08/2015 - Dr Collene Mares - recc 5 yr f/u - due Apr 2021  Past Surgical History:  Procedure Laterality Date  . APPENDECTOMY  07/2012  . HEMORRHOID SURGERY  09/2012   Family History  Problem Relation Age of Onset  . Heart disease Mother   . Macular degeneration Mother   . Cancer - Colon Other   . Cancer - Prostate Other   . Stroke Maternal Aunt   . Cancer - Colon Paternal Uncle   . Cancer - Prostate Paternal Uncle    Social History   Socioeconomic History  . Marital status: Married    Spouse name: [name]  . Number of children: 4  Occupational History    Comment: [employer]  Tobacco Use  . Smoking status: Former Smoker    Packs/day: 1.00    Years: 40.00    Pack years: 40.00    Last attempt to quit: 12/03/2012    Years since quitting: 5.5  . Smokeless tobacco: Never Used  Substance and Sexual Activity  . Alcohol use: Yes    Alcohol/week: 2.0 standard drinks    Types: 2 Standard drinks or equivalent per week    Comment: occ  . Drug use: No  . Sexual activity: Not on file  Social History Narrative   Right handed, caffeine 3-4 cups daily, Married, 4 kids, 9 g kids., FT insurance agent (self employed). 4 yrs college    ROS Constitutional: Denies fever, chills, weight loss/gain, headaches, insomnia,  night sweats or change in appetite. Does c/o fatigue. Eyes: Denies redness, blurred vision, diplopia, discharge, itchy or watery eyes.  ENT: Denies discharge, congestion, post nasal drip,  epistaxis, sore throat, earache, hearing loss, dental pain, Tinnitus, Vertigo, Sinus pain or snoring.  Cardio: Denies chest pain, palpitations, irregular heartbeat, syncope, dyspnea, diaphoresis, orthopnea, PND, claudication or edema Respiratory: denies cough, dyspnea, DOE, pleurisy, hoarseness, laryngitis or wheezing.  Gastrointestinal: Denies dysphagia, heartburn, reflux, water brash, pain, cramps, nausea, vomiting, bloating, diarrhea, constipation, hematemesis, melena, hematochezia, jaundice or hemorrhoids Genitourinary: Denies dysuria, frequency, urgency, nocturia, hesitancy, discharge, hematuria or flank pain Musculoskeletal: Denies arthralgia, myalgia, stiffness, Jt. Swelling, pain, limp or strain/sprain. Denies Falls. Skin: Denies puritis, rash, hives, warts, acne, eczema or change in skin lesion Neuro: No weakness, tremor, incoordination, spasms, paresthesia or pain Psychiatric: Denies  confusion, memory loss or sensory loss. Denies Depression. Endocrine: Denies change in weight, skin, hair change, nocturia, and paresthesia, diabetic polys, visual blurring or hyper / hypo glycemic episodes.  Heme/Lymph: No excessive bleeding, bruising or enlarged lymph nodes.  Physical Exam  BP 118/80   Pulse 72   Temp (!) 97.3 F (36.3 C)   Resp 18   Ht 5\' 9"  (1.753 m)   Wt 243 lb (110.2 kg)   BMI 35.88 kg/m   General Appearance: Well nourished and well groomed and in no apparent distress.  Eyes: PERRLA, EOMs, conjunctiva no swelling or erythema, normal fundi and vessels. Sinuses: No frontal/maxillary tenderness ENT/Mouth: EACs patent / TMs  nl. Nares clear without erythema, swelling, mucoid exudates. Oral hygiene is good. No erythema, swelling, or exudate. Tongue normal, non-obstructing. Tonsils not swollen or erythematous. Hearing normal.  Neck: Supple, thyroid not palpable. No bruits, nodes or JVD. Respiratory: Respiratory effort normal.  BS equal and clear bilateral without rales, rhonci,  wheezing or stridor. Cardio: Heart sounds are normal with regular rate and rhythm and no murmurs, rubs or gallops. Peripheral pulses are normal and equal bilaterally without edema. No aortic or femoral bruits. Chest: symmetric with normal excursions and percussion.  Abdomen: Soft, with Nl bowel sounds. Nontender, no guarding, rebound, hernias, masses, or organomegaly.  Lymphatics: Non tender without lymphadenopathy.  Genitourinary: No hernias.Testes nl. DRE - prostate nl for age - smooth & firm w/o nodules. Musculoskeletal: Full ROM all peripheral extremities, joint stability, 5/5 strength, and normal gait. Skin: Warm and dry without rashes, lesions, cyanosis, clubbing or  ecchymosis.  Neuro: Cranial nerves intact, reflexes equal bilaterally. Normal muscle tone, no cerebellar symptoms. Sensation intact.  Pysch: Alert and oriented X 3 with normal affect, insight and judgment appropriate.   Assessment and Plan  1. Annual Preventative/Screening Exam   2. Essential hypertension  - EKG 12-Lead - [place], RETROPERITNL ABD,  LTD - COMPLETE METABOLIC PANEL WITH GFR  3. Hyperlipidemia, mixed  - EKG 12-Lead - [place], RETROPERITNL ABD,  LTD  4. Prediabetes  - EKG 12-Lead - [place], RETROPERITNL ABD,  LTD  5. Vitamin D deficiency  6. Gastroesophageal reflux disease  7. Screening for colorectal cancer  8. Prostate cancer screening  - PSA  9. Screening for ischemic heart disease  - EKG 12-Lead  10. FHx: heart disease  - EKG 12-Lead - [place], RETROPERITNL ABD,  LTD  11. Former smoker  - EKG 12-Lead - [place], RETROPERITNL ABD,  LTD  12. Screening for AAA (aortic abdominal aneurysm)  13. Fatigue, unspecified type  14. Medication management  - COMPLETE METABOLIC PANEL WITH GFR  15. Screening examination for pulmonary tuberculosis  - PPD      Patient was counseled in prudent diet, weight control to achieve/maintain BMI less than 25, BP monitoring, regular exercise and medications as  discussed.  Discussed med effects and SE's. Routine screening labs and tests as requested with regular follow-up as recommended. Over 40 minutes of exam, counseling, chart review and high complex critical decision making was performed

## 2018-06-03 ENCOUNTER — Ambulatory Visit (INDEPENDENT_AMBULATORY_CARE_PROVIDER_SITE_OTHER): Payer: No Typology Code available for payment source | Admitting: Internal Medicine

## 2018-06-03 VITALS — BP 118/80 | HR 72 | Temp 97.3°F | Resp 18 | Ht 69.0 in | Wt 243.0 lb

## 2018-06-03 DIAGNOSIS — R5383 Other fatigue: Secondary | ICD-10-CM

## 2018-06-03 DIAGNOSIS — Z79899 Other long term (current) drug therapy: Secondary | ICD-10-CM

## 2018-06-03 DIAGNOSIS — R7303 Prediabetes: Secondary | ICD-10-CM

## 2018-06-03 DIAGNOSIS — Z1212 Encounter for screening for malignant neoplasm of rectum: Secondary | ICD-10-CM

## 2018-06-03 DIAGNOSIS — K219 Gastro-esophageal reflux disease without esophagitis: Secondary | ICD-10-CM

## 2018-06-03 DIAGNOSIS — Z111 Encounter for screening for respiratory tuberculosis: Secondary | ICD-10-CM

## 2018-06-03 DIAGNOSIS — I10 Essential (primary) hypertension: Secondary | ICD-10-CM

## 2018-06-03 DIAGNOSIS — E559 Vitamin D deficiency, unspecified: Secondary | ICD-10-CM

## 2018-06-03 DIAGNOSIS — R350 Frequency of micturition: Secondary | ICD-10-CM

## 2018-06-03 DIAGNOSIS — Z125 Encounter for screening for malignant neoplasm of prostate: Secondary | ICD-10-CM

## 2018-06-03 DIAGNOSIS — Z1211 Encounter for screening for malignant neoplasm of colon: Secondary | ICD-10-CM

## 2018-06-03 DIAGNOSIS — Z8249 Family history of ischemic heart disease and other diseases of the circulatory system: Secondary | ICD-10-CM

## 2018-06-03 DIAGNOSIS — Z0000 Encounter for general adult medical examination without abnormal findings: Secondary | ICD-10-CM | POA: Diagnosis not present

## 2018-06-03 DIAGNOSIS — E782 Mixed hyperlipidemia: Secondary | ICD-10-CM

## 2018-06-03 DIAGNOSIS — N401 Enlarged prostate with lower urinary tract symptoms: Secondary | ICD-10-CM | POA: Diagnosis not present

## 2018-06-03 DIAGNOSIS — Z136 Encounter for screening for cardiovascular disorders: Secondary | ICD-10-CM | POA: Diagnosis not present

## 2018-06-03 DIAGNOSIS — Z0001 Encounter for general adult medical examination with abnormal findings: Secondary | ICD-10-CM

## 2018-06-03 DIAGNOSIS — Z13220 Encounter for screening for lipoid disorders: Secondary | ICD-10-CM | POA: Diagnosis not present

## 2018-06-03 DIAGNOSIS — Z87891 Personal history of nicotine dependence: Secondary | ICD-10-CM

## 2018-06-04 ENCOUNTER — Encounter: Payer: Self-pay | Admitting: Internal Medicine

## 2018-06-04 LAB — PSA: PSA: 1 ng/mL (ref <=4.0)

## 2018-06-04 LAB — COMPLETE METABOLIC PANEL WITH GFR
AG RATIO: 1.7 (calc) (ref 1.0–2.5)
ALBUMIN MSPROF: 4.5 g/dL (ref 3.6–5.1)
ALT: 36 U/L (ref 9–46)
AST: 24 U/L (ref 10–35)
Alkaline phosphatase (APISO): 68 U/L (ref 40–115)
BILIRUBIN TOTAL: 0.8 mg/dL (ref 0.2–1.2)
BUN: 20 mg/dL (ref 7–25)
CHLORIDE: 103 mmol/L (ref 98–110)
CO2: 29 mmol/L (ref 20–32)
Calcium: 9.2 mg/dL (ref 8.6–10.3)
Creat: 0.91 mg/dL (ref 0.70–1.25)
GFR, EST AFRICAN AMERICAN: 104 mL/min/{1.73_m2} (ref >=60)
GFR, Est Non African American: 89 mL/min/{1.73_m2} (ref >=60)
Globulin: 2.6 g/dL (calc) (ref 1.9–3.7)
Glucose, Bld: 113 mg/dL — ABNORMAL HIGH (ref 65–99)
POTASSIUM: 3.8 mmol/L (ref 3.5–5.3)
Sodium: 139 mmol/L (ref 135–146)
Total Protein: 7.1 g/dL (ref 6.1–8.1)

## 2018-06-28 ENCOUNTER — Other Ambulatory Visit: Payer: Self-pay | Admitting: Internal Medicine

## 2018-06-28 DIAGNOSIS — I10 Essential (primary) hypertension: Secondary | ICD-10-CM

## 2018-06-28 MED ORDER — BISOPROLOL FUMARATE 10 MG PO TABS: ORAL_TABLET | ORAL | 1 refills | Status: DC

## 2018-08-07 ENCOUNTER — Other Ambulatory Visit: Payer: Self-pay | Admitting: Physician Assistant

## 2018-08-07 ENCOUNTER — Other Ambulatory Visit: Payer: Self-pay | Admitting: Internal Medicine

## 2018-08-29 NOTE — Progress Notes (Signed)
Subjective:    Patient ID: George Montgomery, male    DOB: 1956/04/02, 62 y.o.   MRN: [id number]  HPI    This very nice 62 yo MWM with hx/o present s with c/o cough productive of a putrid dark sputum. Denies fever, chill, sweats, dyspnea or rash. Has 40+ pack year smoking hx til quitting about 4-5 years ago   Outpatient Medications Prior to Visit  Medication Sig Dispense Refill  . ALPRAZolam (XANAX) 0.5 MG tablet Take 1/2-1 tab up to three times a day as needed for anxiety and sleep. Do not take concurrently with pain medication. 30 tablet 0  . bisoprolol (ZEBETA) 10 MG tablet Take 1/2 to 1 tablet daily for BP 90 tablet 1  . Cholecalciferol (VITAMIN D PO) Take 5,000 Units by mouth 3 (three) times daily.    Marland Kitchen EPINEPHrine 0.3 mg/0.3 mL IJ SOAJ injection Inject 0.3 mg into the muscle once. prn    . fenofibrate micronized (LOFIBRA) 134 MG capsule TAKE 1 CAPSULE BY MOUTH ONCE DAILY BEFORE BREAKFAST FOR  BLOOD  FATS 90 capsule 1  . gabapentin (NEURONTIN) 600 MG tablet Take 1/2 to 1 tablet 3 to 4 x/ day for sciatic pain 90 tablet 0  . ibuprofen (ADVIL,MOTRIN) 800 MG tablet Take 1 tablet 2 to 3 x / day for pain & inflammation 90 tablet 2  . meloxicam (MOBIC) 15 MG tablet TAKE 1/2 TO 1 WHOLE TABLET BY MOUTH WITH FOOD FOR PAIN AND INFLAMMATION 90 tablet 1  . montelukast (SINGULAIR) 10 MG tablet TAKE 1 TABLET BY MOUTH AT BEDTIME 90 tablet 3  . ranitidine (ZANTAC) 300 MG tablet Take 1 to 2 tablets daily for heartburn & reflux to allow wean and transition from PPI / pantoprazole 180 tablet 3  . sertraline (ZOLOFT) 50 MG tablet TAKE 1 TABLET BY MOUTH ONCE DAILY 90 tablet 1  . sildenafil (REVATIO) 20 MG tablet Take 1 to 5 tabs daily as needed for XXXX 90 tablet 1  . traMADol (ULTRAM) 50 MG tablet Take 50 mg by mouth every 6 (six) hours as needed for moderate pain.     . valsartan-hydrochlorothiazide (DIOVAN HCT) 80-12.5 MG tablet Take 1 tablet by mouth daily. 90 tablet 3  . VENTOLIN HFA 108 (90 Base) MCG/ACT  inhaler INHALE ONE PUFF BY MOUTH EVERY 6 HOURS AS NEEDED FOR SHORTNESS OF BREATH AND FOR WHEEZING 18 each 0   No facility-administered medications prior to visit.    Marland Kitchenalerg  Past Medical History:  Diagnosis Date  . Arthritis   . Environmental allergies   . OSA on CPAP 08/01/2014  . Reflux    Past Surgical History:  Procedure Laterality Date  . APPENDECTOMY  07/2012  . HEMORRHOID SURGERY  09/2012   Review of Systems    10 point systems review negative except as above.    Objective:   Physical Exam BP 130/78   Pulse (!) 57   Temp 97.7 F (36.5 C)   Ht 5\' 9"  (1.753 m)   Wt 250 lb (113.4 kg)   SpO2 97%   BMI 36.92 kg/m   HEENT - (+) fronto /maxillary tenderness. TM's Nl. N/O/P - clear.  Neck - supple.  Chest - fer scattered rales & rhonchi clearing w/cough . No wheezes.  Cor - Nl HS. RRR w/o sig m. PP 1(+). No edema. MS- FROM w/o deformities.  Gait Nl. Neuro -  Nl w/o focal abnormalities.    Assessment & Plan:   1. Subacute pansinusitis  2.  Bronchitis  - predniSONE (DELTASONE) 20 MG tablet; 1 tab 3 x day for 3 days, then 1 tab 2 x day for 3 days, then 1 tab 1 x day for 5 days  Dispense: 20 tablet;   - levofloxacin (LEVAQUIN) 500 MG tablet; Take 1 tablet daily with food for infection  Dispense: 15 tablet; Refill: 1     .

## 2018-08-30 ENCOUNTER — Ambulatory Visit: Payer: No Typology Code available for payment source | Admitting: Internal Medicine

## 2018-08-30 ENCOUNTER — Encounter: Payer: Self-pay | Admitting: Internal Medicine

## 2018-08-30 VITALS — BP 130/78 | HR 57 | Temp 97.7°F | Ht 69.0 in | Wt 250.0 lb

## 2018-08-30 DIAGNOSIS — J40 Bronchitis, not specified as acute or chronic: Secondary | ICD-10-CM

## 2018-08-30 DIAGNOSIS — J0140 Acute pansinusitis, unspecified: Secondary | ICD-10-CM

## 2018-08-30 MED ORDER — LEVOFLOXACIN 500 MG PO TABS: ORAL_TABLET | ORAL | 1 refills | Status: DC

## 2018-08-30 MED ORDER — PREDNISONE 20 MG PO TABS: ORAL_TABLET | ORAL | 0 refills | Status: DC

## 2018-09-07 ENCOUNTER — Encounter: Payer: Self-pay | Admitting: Physician Assistant

## 2018-09-07 ENCOUNTER — Ambulatory Visit: Payer: No Typology Code available for payment source | Admitting: Physician Assistant

## 2018-09-07 VITALS — BP 132/74 | HR 58 | Temp 98.0°F | Ht 69.0 in | Wt 255.4 lb

## 2018-09-07 DIAGNOSIS — E349 Endocrine disorder, unspecified: Secondary | ICD-10-CM | POA: Diagnosis not present

## 2018-09-07 DIAGNOSIS — E782 Mixed hyperlipidemia: Secondary | ICD-10-CM

## 2018-09-07 DIAGNOSIS — E559 Vitamin D deficiency, unspecified: Secondary | ICD-10-CM

## 2018-09-07 DIAGNOSIS — R7309 Other abnormal glucose: Secondary | ICD-10-CM | POA: Diagnosis not present

## 2018-09-07 DIAGNOSIS — Z79899 Other long term (current) drug therapy: Secondary | ICD-10-CM | POA: Diagnosis not present

## 2018-09-07 DIAGNOSIS — I10 Essential (primary) hypertension: Secondary | ICD-10-CM

## 2018-09-07 LAB — COMPLETE METABOLIC PANEL WITH GFR
AG RATIO: 1.7 (calc) (ref 1.0–2.5)
ALBUMIN MSPROF: 4.4 g/dL (ref 3.6–5.1)
ALT: 50 U/L — AB (ref 9–46)
AST: 19 U/L (ref 10–35)
Alkaline phosphatase (APISO): 56 U/L (ref 40–115)
BILIRUBIN TOTAL: 0.5 mg/dL (ref 0.2–1.2)
BUN: 23 mg/dL (ref 7–25)
CALCIUM: 9.2 mg/dL (ref 8.6–10.3)
CHLORIDE: 104 mmol/L (ref 98–110)
CO2: 31 mmol/L (ref 20–32)
Creat: 1.04 mg/dL (ref 0.70–1.25)
GFR, EST AFRICAN AMERICAN: 88 mL/min/{1.73_m2} (ref >=60)
GFR, EST NON AFRICAN AMERICAN: 76 mL/min/{1.73_m2} (ref >=60)
Globulin: 2.6 g/dL (calc) (ref 1.9–3.7)
Glucose, Bld: 95 mg/dL (ref 65–99)
POTASSIUM: 4.7 mmol/L (ref 3.5–5.3)
SODIUM: 141 mmol/L (ref 135–146)
TOTAL PROTEIN: 7 g/dL (ref 6.1–8.1)

## 2018-09-07 LAB — CBC WITH DIFFERENTIAL/PLATELET
BASOS PCT: 0.4 %
Basophils Absolute: 41 cells/uL (ref 0–200)
EOS ABS: 10 {cells}/uL — AB (ref 15–500)
Eosinophils Relative: 0.1 %
HEMATOCRIT: 39.7 % (ref 38.5–50.0)
Hemoglobin: 13.7 g/dL (ref 13.2–17.1)
LYMPHS ABS: 1782 {cells}/uL (ref 850–3900)
MCH: 31 pg (ref 27.0–33.0)
MCHC: 34.5 g/dL (ref 32.0–36.0)
MCV: 89.8 fL (ref 80.0–100.0)
MPV: 10.9 fL (ref 7.5–12.5)
Monocytes Relative: 7.7 %
Neutro Abs: 7674 cells/uL (ref 1500–7800)
Neutrophils Relative %: 74.5 %
PLATELETS: 218 10*3/uL (ref 140–400)
RBC: 4.42 10*6/uL (ref 4.20–5.80)
RDW: 12.6 % (ref 11.0–15.0)
TOTAL LYMPHOCYTE: 17.3 %
WBC mixed population: 793 cells/uL (ref 200–950)
WBC: 10.3 10*3/uL (ref 3.8–10.8)

## 2018-09-07 LAB — MAGNESIUM: Magnesium: 2.1 mg/dL (ref 1.5–2.5)

## 2018-09-07 LAB — TSH: TSH: 1.05 mIU/L (ref 0.40–4.50)

## 2018-09-07 LAB — LIPID PANEL
CHOL/HDL RATIO: 3.4 (calc) (ref <5.0)
Cholesterol: 158 mg/dL (ref <200)
HDL: 47 mg/dL (ref >40)
LDL Cholesterol (Calc): 90 mg/dL (calc)
NON-HDL CHOLESTEROL (CALC): 111 mg/dL (ref <130)
Triglycerides: 109 mg/dL (ref <150)

## 2018-09-07 MED ORDER — OMEPRAZOLE 20 MG PO CPDR: 20.0000 mg | DELAYED_RELEASE_CAPSULE | Freq: Every day | ORAL | 0 refills | Status: DC

## 2018-09-07 NOTE — Progress Notes (Signed)
FOLLOW UP  Assessment and Plan:   Hypertension Well controlled with current medications  Monitor blood pressure at home; patient to call if consistently greater than 130/80 Continue DASH diet.   Reminder to go to the ER if any CP, SOB, nausea, dizziness, severe HA, changes vision/speech, left arm numbness and tingling and jaw pain.  Cholesterol Currently LDL at goal; continue fenofibrate for trigs; diet for triglycerides discussed - advised to cut down on wine intake (drinks 4 glasses twice weekly) Continue low cholesterol diet and exercise.  Check lipid panel.   Obesity with co morbidities Very long discussion about weight loss, diet, and exercise Recommended diet heavy in fruits and veggies and low in animal meats, cheeses, and dairy products, appropriate calorie intake Discussed ideal weight for height and initial weight goal (230 lb) Will follow up in 4 weeks Water try to get one more glass 50 oz Keep track of your food Add more greens  Get on singulair Get on prilosec 20 mg x 2 weeks in the morning to see if this helps with flushing/SOB  Vitamin D Def Below goal at last visit; has been off of supplement - he is agreeable to restart at 5000 IU daily Defer vit D level to next visit  Continue diet and meds as discussed. Further disposition pending results of labs. Discussed med's effects and SE's.   Over 30 minutes of exam, counseling, chart review, and critical decision making was performed.   Future Appointments  Date Time Provider [place]  09/26/2018  9:30 AM Star Age, MD [facility] None  12/12/2018  3:30 PM Unk Pinto, MD [facility] None  05/02/2019  2:30 PM Burtis Junes, NP [facility]  07/05/2019 10:00 AM Unk Pinto, MD [facility] None    ----------------------------------------------------------------------------------------------------------------------  HPI 62 y.o. male  presents for 3 month follow up on hypertension,  cholesterol, glucose management, weight and vitamin D deficiency  He has stopped sertraline; reports he started taking hemp oil drops and feels much better controlled with this and without SE he was experiencing with SSRI.    He is on a CPAP. Stres test 01/2017, PFTs 01/2017, low dose CT 03/22/2017  BMI is Body mass index is 37.72 kg/m., he has been working on diet and exercise. He states that he has been gaining weight very easily, he has been on prednisone for asthma/bronchitis x 11/12 and this is worsening his weight and he is not sleeping.  Wt Readings from Last 10 Encounters:  09/07/18 255 lb 6.4 oz (115.8 kg)  08/30/18 250 lb (113.4 kg)  06/03/18 243 lb (110.2 kg)  05/11/18 245 lb 6.4 oz (111.3 kg)  02/22/18 239 lb (108.4 kg)  10/29/17 242 lb 6.4 oz (110 kg)  09/22/17 242 lb (109.8 kg)  07/20/17 240 lb (108.9 kg)  04/27/17 243 lb 9.6 oz (110.5 kg)  04/26/17 240 lb (108.9 kg)    His blood pressure has been controlled at home, today their BP is BP: 132/74  He does workout. He denies chest pain, dizziness.  He states at first with walking he gets short of breath but then the more he walks he states he gets better with it. He has wheezing with it. No CP, no dizziness, no diaphoresis. He will just break out in a sweat randomly, not exertional. Normal stress test 01/2017. Diarrhea some.    He is on cholesterol medication (fenofibrate) and denies myalgias. His cholesterol is not at goal. The cholesterol last visit was:   Lab Results  Component  Value Date   CHOL 170 02/22/2018   HDL 32 (L) 02/22/2018   LDLCALC 93 02/22/2018   TRIG 334 (H) 02/22/2018   CHOLHDL 5.3 (H) 02/22/2018    He has been working on diet and exercise for glucose management (hx of prediabetes), and denies foot ulcerations, increased appetite, nausea, paresthesia of the feet, polydipsia, polyuria, visual disturbances, vomiting and weight loss. Last A1C in the office was:  Lab Results  Component Value Date    HGBA1C 5.4 10/29/2017   Patient is not currently on vitamin D   Lab Results  Component Value Date   VD25OH 32 10/29/2017        Current Medications:  Current Outpatient Medications on File Prior to Visit  Medication Sig  . ALPRAZolam (XANAX) 0.5 MG tablet Take 1/2-1 tab up to three times a day as needed for anxiety and sleep. Do not take concurrently with pain medication.  . bisoprolol (ZEBETA) 10 MG tablet Take 1/2 to 1 tablet daily for BP  . Cholecalciferol (VITAMIN D PO) Take 5,000 Units by mouth 3 (three) times daily.  Marland Kitchen EPINEPHrine 0.3 mg/0.3 mL IJ SOAJ injection Inject 0.3 mg into the muscle once. prn  . fenofibrate micronized (LOFIBRA) 134 MG capsule TAKE 1 CAPSULE BY MOUTH ONCE DAILY BEFORE BREAKFAST FOR  BLOOD  FATS  . gabapentin (NEURONTIN) 600 MG tablet Take 1/2 to 1 tablet 3 to 4 x/ day for sciatic pain  . ibuprofen (ADVIL,MOTRIN) 800 MG tablet Take 1 tablet 2 to 3 x / day for pain & inflammation  . levofloxacin (LEVAQUIN) 500 MG tablet Take 1 tablet daily with food for infection  . meloxicam (MOBIC) 15 MG tablet TAKE 1/2 TO 1 WHOLE TABLET BY MOUTH WITH FOOD FOR PAIN AND INFLAMMATION  . predniSONE (DELTASONE) 20 MG tablet 1 tab 3 x day for 3 days, then 1 tab 2 x day for 3 days, then 1 tab 1 x day for 5 days  . ranitidine (ZANTAC) 300 MG tablet Take 1 to 2 tablets daily for heartburn & reflux to allow wean and transition from PPI / pantoprazole  . sertraline (ZOLOFT) 50 MG tablet TAKE 1 TABLET BY MOUTH ONCE DAILY  . sildenafil (REVATIO) 20 MG tablet Take 1 to 5 tabs daily as needed for XXXX  . traMADol (ULTRAM) 50 MG tablet Take 50 mg by mouth every 6 (six) hours as needed for moderate pain.   . valsartan-hydrochlorothiazide (DIOVAN HCT) 80-12.5 MG tablet Take 1 tablet by mouth daily.  . VENTOLIN HFA 108 (90 Base) MCG/ACT inhaler INHALE ONE PUFF BY MOUTH EVERY 6 HOURS AS NEEDED FOR SHORTNESS OF BREATH AND FOR WHEEZING  . montelukast (SINGULAIR) 10 MG tablet TAKE 1 TABLET BY  MOUTH AT BEDTIME (Patient not taking: Reported on 09/07/2018)   No current facility-administered medications on file prior to visit.      Allergies:  Allergies  Allergen Reactions  . Bee Venom Anaphylaxis  . Lisinopril Other (See Comments)    Patient intolerant of drug which caused his tongue to tingle.  . Celebrex [Celecoxib]      Medical History:  Past Medical History:  Diagnosis Date  . Arthritis   . Environmental allergies   . OSA on CPAP 08/01/2014  . Reflux    Family history- Reviewed and unchanged Social history- Reviewed and unchanged   Review of Systems:  Review of Systems  Constitutional: Negative for malaise/fatigue and weight loss.  HENT: Negative for hearing loss and tinnitus.   Eyes: Negative for  blurred vision and double vision.  Respiratory: Negative for cough, shortness of breath and wheezing.   Cardiovascular: Negative for chest pain, palpitations, orthopnea, claudication and leg swelling.  Gastrointestinal: Negative for abdominal pain, blood in stool, constipation, diarrhea, heartburn, melena, nausea and vomiting.  Genitourinary: Negative.   Musculoskeletal: Positive for joint pain (Bilateral hands). Negative for myalgias.  Skin: Negative for rash.  Neurological: Negative for dizziness, tingling, sensory change, weakness and headaches.  Endo/Heme/Allergies: Negative for polydipsia.  Psychiatric/Behavioral: Negative for depression, hallucinations and memory loss. The patient has insomnia. The patient is not nervous/anxious.   All other systems reviewed and are negative.   Physical Exam: BP 132/74   Pulse (!) 58   Temp 98 F (36.7 C)   Ht 5\' 9"  (1.753 m)   Wt 255 lb 6.4 oz (115.8 kg)   SpO2 98%   BMI 37.72 kg/m  Wt Readings from Last 3 Encounters:  09/07/18 255 lb 6.4 oz (115.8 kg)  08/30/18 250 lb (113.4 kg)  06/03/18 243 lb (110.2 kg)   General Appearance: Well nourished, in no apparent distress. Eyes: PERRLA, EOMs, conjunctiva no  swelling or erythema Sinuses: No Frontal/maxillary tenderness ENT/Mouth: Ext aud canals clear, TMs without erythema, bulging. No erythema, swelling, or exudate on post pharynx.  Tonsils not swollen or erythematous. Hearing normal.  Neck: Supple, thyroid normal.  Respiratory: Respiratory effort normal, BS equal bilaterally without rales, rhonchi, wheezing or stridor.  Cardio: RRR with no MRGs. Brisk peripheral pulses without edema.  Abdomen: Soft, + BS.  Non tender, no guarding, rebound, hernias, masses. Lymphatics: Non tender without lymphadenopathy.  Musculoskeletal: ROM limited in bilateral hands, generalized bony enlargement of joints in bilateral hands, 5/5 strength, Normal gait Skin: Warm, dry without rashes, lesions, ecchymosis.  Neuro: Cranial nerves intact. No cerebellar symptoms.  Psych: Awake and oriented X 3, normal affect, Insight and Judgment appropriate.    Vicie Mutters, PA-C 4:01 PM [facility]

## 2018-09-07 NOTE — Patient Instructions (Addendum)
Goals Water try to get one more glass 50 oz Keep track of your food Add more greens  Get on singulair Get on prilosec 20 mg x 2 weeks in the morning to see if this helps with flushing/SOB  WATER IS IMPORTANT  Being dehydrated can hurt your kidneys, cause fatigue, headaches, muscle aches, joint pain, and dry skin/nails so please increase your fluids.   Drink 80-100 oz a day of water, measure it out!  Eat 3 meals a day, have to do breakfast, eat protein- hard boiled eggs, protein bar like [place] protein bar, greek yogurt like oikos triple zero, chobani 100, or light n fit greek  Can check out plantnanny app on your phone to help you keep track of your water       When it comes to diets, agreement about the perfect plan isn't easy to find, even among the experts. Experts at the [employer] developed an idea known as the Healthy Eating Plate. Just imagine a plate divided into logical, healthy portions.  The emphasis is on diet quality:  Load up on vegetables and fruits - one-half of your plate: Aim for color and variety, and remember that potatoes don't count.  Go for whole grains - one-quarter of your plate: Whole wheat, barley, wheat berries, quinoa, oats, brown rice, and foods made with them. If you want pasta, go with whole wheat pasta.  Protein power - one-quarter of your plate: Fish, chicken, beans, and nuts are all healthy, versatile protein sources. Limit red meat.  The diet, however, does go beyond the plate, offering a few other suggestions.  Use healthy plant oils, such as olive, canola, soy, corn, sunflower and peanut. Check the labels, and avoid partially hydrogenated oil, which have unhealthy trans fats.  If you're thirsty, drink water. Coffee and tea are good in moderation, but skip sugary drinks and limit milk and dairy products to one or two daily servings.  The type of carbohydrate in the diet is more important than the amount. Some  sources of carbohydrates, such as vegetables, fruits, whole grains, and beans-are healthier than others.  Finally, stay active.   Google mindful eating and here are some tips and tricks below.   Rate your hunger before you eat on a scale of 1-10, try to eat closer to a 6 or higher. And if you are at below that, why are you eating? Slow down and listen to your body.      SMALL CHANGES  We want weight loss that will last so you should lose 1-2 pounds a week.  THAT IS IT! Please pick THREE things a month to change. Once it is a habit check off the item. Then pick another three items off the list to become habits.  If you are already doing a habit on the list GREAT!  Cross that item off! o Don't drink your calories. Ie, alcohol, soda, fruit juice, and sweet tea.  o Drink more water. Drink a glass when you feel hungry or before each meal.  o Eat breakfast - Complex carb and protein (likeDannon light and fit yogurt, oatmeal, fruit, eggs, [place] bacon). o Measure your cereal.  Eat no more than one cup a day. (ie [place]) o Eat an apple a day. o Add a vegetable a day. o Try a new vegetable a month. o Use Pam! Stop using oil or butter to cook. o Don't finish your plate or use smaller plates. o Share your dessert. o  Eat sugar free Jello for dessert or frozen grapes. o Don't eat 2-3 hours before bed. o Switch to whole wheat bread, pasta, and brown rice. o Make healthier choices when you eat out. No fries! o Pick baked chicken, NOT fried. o Don't forget to SLOW DOWN when you eat. It is not going anywhere.  o Take the stairs. o Park far away in the parking lot o [employer] (or weights) for 10 minutes while watching TV. o Walk at work for 10 minutes during break. o Walk outside 1 time a week with your friend, kids, dog, or significant other. o Start a walking group at [place] the mall as much as you can tolerate.  o Keep a food diary. o Weigh yourself daily. o Walk for 15 minutes 3  days per week. o Cook at home more often and eat out less.  If life happens and you go back to old habits, it is okay.  Just start over. You can do it!   If you experience chest pain, get short of breath, or tired during the exercise, please stop immediately and inform your doctor.     Silent reflux: Not all heartburn burns...Marland KitchenMarland KitchenMarland Kitchen  What is LPR? Laryngopharyngeal reflux (LPR) or silent reflux is a condition in which acid that is made in the stomach travels up the esophagus (swallowing tube) and gets to the throat. Not everyone with reflux has a lot of heartburn or indigestion. In fact, many people with LPR never have heartburn. This is why LPR is called SILENT REFLUX, and the terms "Silent reflux" and "LPR" are often used interchangeably. Because LPR is silent, it is sometimes difficult to diagnose.  How can you tell if you have LPR?  Marland Kitchen Chronic hoarseness- Some people have hoarseness that comes and goes . throat clearing  . Cough . It can cause shortness of breath and cause asthma like symptoms. Marland Kitchen a feeling of a lump in the throat  . difficulty swallowing . a problem with too much nose and throat drainage.  . Some people will feel their esophagus spasm which feels like their heart beating hard and fast, this will usually be after a meal, at rest, or lying down at night.    How do I treat this? Treatment for LPR should be individualized, and your doctor will suggest the best treatment for you. Generally there are several treatments for LPR: . changing habits and diet to reduce reflux,  . medications to reduce stomach acid, and  . surgery to prevent reflux. Most people with LPR need to modify how and when they eat, as well as take some medication, to get well. Sometimes, nonprescription liquid antacids, such as Maalox, Gelucil and Mylanta are recommended. When used, these antacids should be taken four times each day - one tablespoon one hour after each meal and before bedtime. Dietary  and lifestyle changes alone are not often enough to control LPR - medications that reduce stomach acid are also usually needed. These must be prescribed by our doctor.   TIPS FOR REDUCING REFLUX AND LPR Control your LIFE-STYLE and your DIET! Marland Kitchen If you use tobacco, QUIT.  Marland Kitchen Smoking makes you reflux. After every cigarette you have some LPR.  . Don't wear clothing that is too tight, especially around the waist (trousers, corsets, belts).  . Do not lie down just after eating...in fact, do not eat within three hours of bedtime.  . You should be on a low-fat diet.  . Limit  your intake of red meat.  . Limit your intake of butter.  Marland Kitchen Avoid fried foods.  . Avoid chocolate  . Avoid cheese.  Marland Kitchen Avoid eggs. Marland Kitchen Specifically avoid caffeine (especially coffee and tea), soda pop (especially cola) and mints.  . Avoid alcoholic beverages, particularly in the evening.

## 2018-09-16 ENCOUNTER — Other Ambulatory Visit: Payer: Self-pay | Admitting: Adult Health

## 2018-09-19 ENCOUNTER — Telehealth: Payer: Self-pay | Admitting: *Deleted

## 2018-09-19 NOTE — Telephone Encounter (Signed)
Mailing pt's paperwork to [facility] health @ [address] [address], [place] [address].

## 2018-09-26 ENCOUNTER — Ambulatory Visit: Payer: BLUE CROSS/BLUE SHIELD | Admitting: Neurology

## 2018-10-06 ENCOUNTER — Ambulatory Visit: Payer: Self-pay | Admitting: Physician Assistant

## 2018-10-12 NOTE — Progress Notes (Deleted)
Assessment and Plan:    HPI 62 y.o.male presents for weight follow up and flushing.  Water try to get one more glass 50 oz Keep track of your food Add more greens  Get on singulair Get on prilosec 20 mg x 2 weeks in the morning to see if this helps with flushing/SOB  Past Medical History:  Diagnosis Date  . Arthritis   . Environmental allergies   . OSA on CPAP 08/01/2014  . Reflux      Allergies  Allergen Reactions  . Bee Venom Anaphylaxis  . Lisinopril Other (See Comments)    Patient intolerant of drug which caused his tongue to tingle.  . Celebrex [Celecoxib]     Current Outpatient Medications on File Prior to Visit  Medication Sig  . albuterol (PROVENTIL HFA;VENTOLIN HFA) 108 (90 Base) MCG/ACT inhaler Use 2 inhalations 15 - 20 minutes apart every 4 hours as needed to Rescue Asthma  . ALPRAZolam (XANAX) 0.5 MG tablet Take 1/2-1 tab up to three times a day as needed for anxiety and sleep. Do not take concurrently with pain medication.  . bisoprolol (ZEBETA) 10 MG tablet Take 1/2 to 1 tablet daily for BP  . Cholecalciferol (VITAMIN D PO) Take 5,000 Units by mouth 3 (three) times daily.  Marland Kitchen EPINEPHrine 0.3 mg/0.3 mL IJ SOAJ injection Inject 0.3 mg into the muscle once. prn  . fenofibrate micronized (LOFIBRA) 134 MG capsule TAKE 1 CAPSULE BY MOUTH ONCE DAILY BEFORE BREAKFAST FOR  BLOOD  FATS  . gabapentin (NEURONTIN) 600 MG tablet Take 1/2 to 1 tablet 3 to 4 x/ day for sciatic pain  . ibuprofen (ADVIL,MOTRIN) 800 MG tablet Take 1 tablet 2 to 3 x / day for pain & inflammation  . levofloxacin (LEVAQUIN) 500 MG tablet Take 1 tablet daily with food for infection  . meloxicam (MOBIC) 15 MG tablet TAKE 1/2 TO 1 WHOLE TABLET BY MOUTH WITH FOOD FOR PAIN AND INFLAMMATION  . montelukast (SINGULAIR) 10 MG tablet TAKE 1 TABLET BY MOUTH AT BEDTIME (Patient not taking: Reported on 09/07/2018)  . omeprazole (PRILOSEC) 20 MG capsule Take 1 capsule (20 mg total) by mouth daily.  .  predniSONE (DELTASONE) 20 MG tablet 1 tab 3 x day for 3 days, then 1 tab 2 x day for 3 days, then 1 tab 1 x day for 5 days  . ranitidine (ZANTAC) 300 MG tablet Take 1 to 2 tablets daily for heartburn & reflux to allow wean and transition from PPI / pantoprazole  . sertraline (ZOLOFT) 50 MG tablet TAKE 1 TABLET BY MOUTH ONCE DAILY  . sildenafil (REVATIO) 20 MG tablet Take 1 to 5 tabs daily as needed for XXXX  . traMADol (ULTRAM) 50 MG tablet Take 50 mg by mouth every 6 (six) hours as needed for moderate pain.   . valsartan-hydrochlorothiazide (DIOVAN HCT) 80-12.5 MG tablet Take 1 tablet by mouth daily.   No current facility-administered medications on file prior to visit.     ROS: all negative except above.   Physical Exam: There were no vitals filed for this visit. There were no vitals taken for this visit. General Appearance: Well nourished, in no apparent distress. Eyes: PERRLA, EOMs, conjunctiva no swelling or erythema Sinuses: No Frontal/maxillary tenderness ENT/Mouth: Ext aud canals clear, TMs without erythema, bulging. No erythema, swelling, or exudate on post pharynx.  Tonsils not swollen or erythematous. Hearing normal.  Neck: Supple, thyroid normal.  Respiratory: Respiratory effort normal, BS equal bilaterally without rales, rhonchi, wheezing  or stridor.  Cardio: RRR with no MRGs. Brisk peripheral pulses without edema.  Abdomen: Soft, + BS.  Non tender, no guarding, rebound, hernias, masses. Lymphatics: Non tender without lymphadenopathy.  Musculoskeletal: Full ROM, 5/5 strength, normal gait.  Skin: Warm, dry without rashes, lesions, ecchymosis.  Neuro: Cranial nerves intact. Normal muscle tone, no cerebellar symptoms. Sensation intact.  Psych: Awake and oriented X 3, normal affect, Insight and Judgment appropriate.     Vicie Mutters, PA-C 7:56 PM [facility]

## 2018-10-13 ENCOUNTER — Ambulatory Visit: Payer: Self-pay | Admitting: Physician Assistant

## 2018-10-24 ENCOUNTER — Other Ambulatory Visit: Payer: Self-pay | Admitting: Physician Assistant

## 2018-10-24 ENCOUNTER — Other Ambulatory Visit: Payer: Self-pay | Admitting: Adult Health

## 2018-10-25 ENCOUNTER — Ambulatory Visit: Payer: No Typology Code available for payment source | Admitting: Neurology

## 2018-11-07 ENCOUNTER — Encounter: Payer: Self-pay | Admitting: Neurology

## 2018-11-07 ENCOUNTER — Telehealth: Payer: Self-pay

## 2018-11-07 NOTE — Telephone Encounter (Signed)
I called pt and reminded him to bring his bipap to the appt with Dr. Rexene Alberts tomorrow. Pt verbalized understanding.

## 2018-11-08 ENCOUNTER — Encounter: Payer: Self-pay | Admitting: Neurology

## 2018-11-08 ENCOUNTER — Ambulatory Visit: Payer: PRIVATE HEALTH INSURANCE | Admitting: Neurology

## 2018-11-08 VITALS — BP 145/66 | HR 50 | Ht 69.5 in | Wt 250.0 lb

## 2018-11-08 DIAGNOSIS — Z9989 Dependence on other enabling machines and devices: Secondary | ICD-10-CM

## 2018-11-08 DIAGNOSIS — G4733 Obstructive sleep apnea (adult) (pediatric): Secondary | ICD-10-CM

## 2018-11-08 DIAGNOSIS — E669 Obesity, unspecified: Secondary | ICD-10-CM

## 2018-11-08 NOTE — Patient Instructions (Addendum)
Please continue using your CPAP regularly. While your insurance requires that you use CPAP at least 4 hours each night on 70% of the nights, I recommend, that you not skip any nights and use it throughout the night if you can. Getting used to CPAP and staying with the treatment long term does take time and patience and discipline. Untreated obstructive sleep apnea when it is moderate to severe can have an adverse impact on cardiovascular health and raise her risk for heart disease, arrhythmias, hypertension, congestive heart failure, stroke and diabetes. Untreated obstructive sleep apnea causes sleep disruption, nonrestorative sleep, and sleep deprivation. This can have an impact on your day to day functioning and cause daytime sleepiness and impairment of cognitive function, memory loss, mood disturbance, and problems focussing. Using CPAP regularly can improve these symptoms.   I will make a referral to [facility], Dr. Leafy Ro or one of her associates.

## 2018-11-08 NOTE — Progress Notes (Signed)
Subjective:    Patient ID: George Montgomery is a 63 y.o. male.  HPI     Interim history:   [name] is a 63 year old right-handed gentleman with an underlying medical  history of cervical and lumbar degenerative spine disease, anxiety, depression (not currently on medication d/t side), asthma, hypertension, obesity, and OSA, who presents for follow-up consultation of his OSA, for his yearly checkup. The patient is unaccompanied today. I last saw him on Today, 09/22/2017, at which time he was fully compliant with his BiPAP and reported some sleep disruption from his joint pain and nocturia.  Today, 11/08/2018: I reviewed his BiPAP compliance data from 10/09/2018 through 11/07/2018 which is a total of 30 days, during which time he used his BiPAP every night with percent used days greater than 4 hours at 100%, indicating superb compliance. Average usage of 9 hours and 21 minutes, residual AHI at goal at 1.4 per hour, leak in the acceptable range, pressure at 17/13 cm. He uses nasal pillows, gets his supplies online at this time. He would like to look into getting a new machine when he is on his Medicare insurance in a couple of years. He is fully compliant with treatment and indicates sleeping well with BiPAP but does have residual daytime somnolence. He has had some ongoing issues with joint pain affecting both hands in the right shoulder. He has seen orthopedics for this. He has been trying to lose weight but has stagnated in his weight loss endeavors. He would like a referral to weight management clinic. His elderly mother is not doing well. His wife will have joint replacement surgery this month, he has had some stressors lately.    The patient's allergies, current medications, family history, past medical history, past social history, past surgical history and problem list were reviewed and updated as appropriate.    Previously (copied from previous notes for reference):    I saw him on  07/20/2016, at which time he reported doing quite well, no further headaches, had some joint pain, requested to switch in his DME provider to [facility]. He was compliant with his BiPAP of 17/13 cm.   I reviewed his BiPAP compliance data from 08/23/2017 through 09/21/2014 which is a total of 30 days, during which time he used his machine every night with percent used days greater than 4 hours at 100%, indicating superb compliance with an average usage of 8 hours and 22 minutes, residual AHI at goal at 1.9 per hour, leak acceptable, pressure of 17/13 cm.    I saw him on 07/18/2015, at which time he reported feeling better. Headaches had improved. He was compliant with BiPAP. He was on verapamil. Unfortunately, he had taken a fall on his boat and landed on both knees, was suffering from right knee pain since then and was supposed to see her orthopedic doctor. He was taking trazodone as needed, 150 mg strength, a quarter of a pill as needed.   I saw him on 08/27/2014 and he canceled an appointment for December 2015. He was advised to follow-up in one month at the time. I suggested sumatriptan 100 mg strength for abortive treatment of cluster headaches at the time and starting him on verapamil for headache prevention. I asked him to be compliant with BiPAP therapy. I asked him to undergo a brain MRI. He did not pursue this as his headaches improved after he started the verapamil and he started feeling better.   I reviewed his BiPAP compliance data from  06/18/2015 through 07/17/2015 which is a total of 30 days during which time he was 100% compliant. Average usage was 8 hours and 57 minutes. He estimates that he sleeps about 6-7 hours on an average night. Sometimes he has to get up to use the bathroom. Average AHI was 1.5 per hour, pressure of 17/13, leak low.   The patient reports new onset HA since 08/02/14, started abruptly. No triggers noted. He recently had the flu shot. He also stopped his testosterone  replacement about a month ago. He does endorse more stress, work related. He has also recently increased his caffeine intake. He has had a daily headache which lasts up to 2 hours. It typically starts in the evening. It is right-sided behind his eye and retro-orbital also in the temporal area and radiates to the back. It is severe. He does get puffiness around the right eye and occasional runny nose on the right side. He does not have any double vision or blurry vision. Glasses have not been checked in over 18 months. He does not have nausea but does have to lie down. Hydrocodone has not helped it only 1 pill and he has to take 2. He has also taken high-dose Motrin, 600 mg, and has tried a muscle relaxant, he has to lie down. He has never had anything like this before. He does not have a history of migraines before. He does not note any neurological symptoms and in between he feels at baseline. He has been reluctant to do anything besides the minimal for fear of exacerbating these headaches. They have been daily since 08/02/2014. He was prescribed Frovatriptan on 08/21/14 by Dr. Bebe Shaggy, but his insurance did not cover that. He has tried sumatriptan and 50 mg which he felt has helped after about 15-20 minutes.he has found relief with taking a warm bath. He has also tried heat pad on his head, which provided some relief.   The patient recalls being diagnosed with OSA some 12 years ago, severe, per patient. Over the years, he has had about 3 sleep studies, last some 3 years ago. He was originally placed on CPAP and most recently at the last sleep study he remembers that he was switched to BiPAP. He brought his BiPAP machine in for review and we were able to get a download: This was from 05/01/2014 through 07/31/2014 during which time he used his BiPAP machine every night without any skipped days and his percent used days greater than 4 hours was 100% indicating superb compliance, average usage was 8 hours and 7  minutes approximately. Residual AHI was low at 0.7 per hour and leak was very low at 1 minute and 58 seconds as far as large leak per day dose. His pressure is 17/13 cm.     His typical bedtime is between 10:30 and 11 PM and usually falls asleep quickly. Weight time is between 6 and 6:30 AM. He estimates that he gets about 7 hours of sleep. He denies any morning headaches or severe daytime somnolence. He does have an ESS of 11/24 today. He denies significant restless leg symptoms. His first sleep study may have been about 12 years ago. He gained quite a bit of weight after he quit smoking about 2 years ago. He gets his DME care through [facility].    His Past Medical History Is Significant For: Past Medical History:  Diagnosis Date  . Arthritis   . Environmental allergies   . OSA on CPAP 08/01/2014  .  Reflux     His Past Surgical History Is Significant For: Past Surgical History:  Procedure Laterality Date  . APPENDECTOMY  07/2012  . HEMORRHOID SURGERY  09/2012    His Family History Is Significant For: Family History  Problem Relation Age of Onset  . Heart disease Mother   . Macular degeneration Mother   . Cancer - Colon Other   . Cancer - Prostate Other   . Stroke Maternal Aunt   . Cancer - Colon Paternal Uncle   . Cancer - Prostate Paternal Uncle     His Social History Is Significant For: Social History   Socioeconomic History  . Marital status: Married    Spouse name: [name]  . Number of children: 4  . Years of education: college  . Highest education level: Not on file  Occupational History    Comment: [employer]  Social Needs  . Financial resource strain: Not on file  . Food insecurity:    Worry: Not on file    Inability: Not on file  . Transportation needs:    Medical: Not on file    Non-medical: Not on file  Tobacco Use  . Smoking status: Former Smoker    Packs/day: 1.00    Years: 40.00    Pack years: 40.00    Last attempt  to quit: 12/03/2012    Years since quitting: 5.9  . Smokeless tobacco: Never Used  Substance and Sexual Activity  . Alcohol use: Yes    Alcohol/week: 2.0 standard drinks    Types: 2 Standard drinks or equivalent per week    Comment: occ  . Drug use: No  . Sexual activity: Not on file  Lifestyle  . Physical activity:    Days per week: Not on file    Minutes per session: Not on file  . Stress: Not on file  Relationships  . Social connections:    Talks on phone: Not on file    Gets together: Not on file    Attends religious service: Not on file    Active member of club or organization: Not on file    Attends meetings of clubs or organizations: Not on file    Relationship status: Not on file  Other Topics Concern  . Not on file  Social History Narrative   Right handed, caffeine 3-4 cups daily, Married, 4 kids, 9 g kids., FT insurance agent (self employed). 4 yrs college    His Allergies Are:  Allergies  Allergen Reactions  . Bee Venom Anaphylaxis  . Lisinopril Other (See Comments)    Patient intolerant of drug which caused his tongue to tingle.  . Celebrex [Celecoxib]   :   His Current Medications Are:  Outpatient Encounter Medications as of 11/08/2018  Medication Sig  . albuterol (PROVENTIL HFA;VENTOLIN HFA) 108 (90 Base) MCG/ACT inhaler Use 2 inhalations 15 - 20 minutes apart every 4 hours as needed to Rescue Asthma  . ALPRAZolam (XANAX) 0.5 MG tablet Take 1/2-1 tab up to three times a day as needed for anxiety and sleep. Do not take concurrently with pain medication.  . bisoprolol (ZEBETA) 10 MG tablet Take 1/2 to 1 tablet daily for BP  . Cholecalciferol (VITAMIN D PO) Take 5,000 Units by mouth 3 (three) times daily.  Marland Kitchen EPINEPHrine 0.3 mg/0.3 mL IJ SOAJ injection Inject 0.3 mg into the muscle once. prn  . fenofibrate micronized (LOFIBRA) 134 MG capsule TAKE 1 CAPSULE BY MOUTH ONCE DAILY BEFORE BREAKFAST FOR  BLOOD  FATS  . meloxicam (MOBIC) 15 MG tablet TAKE 1/2 TO 1 WHOLE  TABLET BY MOUTH WITH FOOD FOR PAIN AND INFLAMMATION  . montelukast (SINGULAIR) 10 MG tablet TAKE 1 TABLET BY MOUTH AT BEDTIME  . omeprazole (PRILOSEC) 20 MG capsule TAKE 1 CAPSULE BY MOUTH ONCE DAILY  . phentermine (ADIPEX-P) 37.5 MG tablet TAKE 1 TABLET EVERY MORNING AND 1/2 TABLET AT 2PM FOR DIETING AND WEIGHT LOSSS  . ranitidine (ZANTAC) 300 MG tablet Take 1 to 2 tablets daily for heartburn & reflux to allow wean and transition from PPI / pantoprazole  . sertraline (ZOLOFT) 50 MG tablet TAKE 1 TABLET BY MOUTH ONCE DAILY  . sildenafil (REVATIO) 20 MG tablet Take 1 to 5 tabs daily as needed for XXXX  . traMADol (ULTRAM) 50 MG tablet Take 50 mg by mouth every 6 (six) hours as needed for moderate pain.   . valsartan-hydrochlorothiazide (DIOVAN HCT) 80-12.5 MG tablet Take 1 tablet by mouth daily.  . [DISCONTINUED] gabapentin (NEURONTIN) 600 MG tablet Take 1/2 to 1 tablet 3 to 4 x/ day for sciatic pain  . [DISCONTINUED] ibuprofen (ADVIL,MOTRIN) 800 MG tablet Take 1 tablet 2 to 3 x / day for pain & inflammation  . [DISCONTINUED] levofloxacin (LEVAQUIN) 500 MG tablet Take 1 tablet daily with food for infection  . [DISCONTINUED] predniSONE (DELTASONE) 20 MG tablet 1 tab 3 x day for 3 days, then 1 tab 2 x day for 3 days, then 1 tab 1 x day for 5 days   No facility-administered encounter medications on file as of 11/08/2018.   :  Review of Systems:  Out of a complete 14 point review of systems, all are reviewed and negative with the exception of these symptoms as listed below:  Review of Systems  Neurological:       Pt presents today to discuss his bipap. Pt reports that his cpap is going well. He buys his supplies online. He would like a new machine next year when he starts medicare.    Objective:  Neurological Exam  Physical Exam Physical Examination:   Vitals:   11/08/18 1355  BP: (!) 145/66  Pulse: (!) 50    General Examination: The patient is a very pleasant 63 y.o. male in no acute  distress. He appears well-developed and well-nourished and well groomed.   HEENT:Normocephalic, atraumatic, pupils are equal, round and reactive to light and accommodation. Extraocular tracking is good without limitation to gaze excursion or nystagmus noted. Corrective eye glasses in place. Normal smooth pursuit is noted. Hearing is grossly intact. Face is symmetric with normal facial animation and normal facial sensation. Speech is clear with no dysarthria noted. There is no hypophonia. There is no lip, neck/head, jaw or voice tremor. Neck shows FROM. Oropharynx exam reveals: mild mouth dryness, adequate dental hygiene and moderate airway crowding. Mallampati is class II. Tongue protrudes centrally and palate elevates symmetrically. Tonsils are absent.   Chest:Clear to auscultation without wheezing, rhonchi or crackles noted.  Heart:S1+S2+0, regular and normal without murmurs, rubs or gallops noted.   Abdomen:Soft, non-tender and non-distended.  Extremities:There is no pitting edema in the distal lower extremities bilaterally.  Skin: Warm and dry without trophic changes noted.   Musculoskeletal: exam reveals right shoulder pain and arthritic changes in both hands, L more than R.   Neurologically:  Mental status: The patient is awake, alert and oriented in all 4 spheres. His immediate and remote memory, attention, language skills and fund of knowledge are appropriate. There is no  evidence of aphasia, agnosia, apraxia or anomia. Speech is clear with normal prosody and enunciation. Thought process is linear. Mood is normal and affect is normal.  Cranial nerves II - XII are as described above under HEENT exam. Motor exam: Normal bulk, strength and tone is noted. There is no drift, tremor or rebound. Fine motor skills and coordination: intact grossly.   Cerebellar testing: No dysmetria or intention tremor. There is no truncal or gait ataxia.  Sensory exam: intact to light touch in the  upper and lower extremities.  Gait, station and balance: He stands easily. No veering to one side is noted. No leaning to one side is noted. Posture is age-appropriate and stance is narrow based. Gait shows normal stride length and normal pace. No problems turning are noted.  in my hope about  Assessment and Plan:   In summary, IZAAN KINGBIRD is a very pleasant 63 year old male with an underlying medical history of cervical and lumbar degenerative spine disease, anxiety, depression, asthma, hypertension, history of cluster headaches, and obesity who presents for follow-up consultation of his obstructive sleep apnea, well-established on BiPAP therapy. He continues to be fully compliant and commended for this. He does struggle with daytime sleepiness, has had some sleep disruption secondary to pain. He has seen orthopedics for this. He may seek pain management referral as he has tried injection treatment and treatment with tramadol has not been helpful he states. He would like to get a referral to weight management as his weight has been stagnant. He is commended first treatment adherence, we talked a little bit about utilizing a wake promoting agent potentially down the [place] but I would like to proceed with the weight management referral first. He is encouraged to talk to his orthopedic specialist again about ongoing treatment and options for pain control. From my end of things I suggested a one-year checkup, sooner if needed. I answered all his questions today and he was in agreement. I spent 30 minutes in total face-to-face time with the patient, more than 50% of which was spent in counseling and coordination of care, reviewing test results, reviewing medication and discussing or reviewing the diagnosis of OSA, its prognosis and treatment options. Pertinent laboratory and imaging test results that were available during this visit with the patient were reviewed by me and considered in my medical  decision making (see chart for details).

## 2018-11-30 ENCOUNTER — Other Ambulatory Visit: Payer: Self-pay | Admitting: Internal Medicine

## 2018-12-05 ENCOUNTER — Encounter: Payer: Self-pay | Admitting: Adult Health

## 2018-12-05 ENCOUNTER — Ambulatory Visit (HOSPITAL_COMMUNITY)
Admission: RE | Admit: 2018-12-05 | Discharge: 2018-12-05 | Disposition: A | Discharge status: Home / Self Care | Payer: PRIVATE HEALTH INSURANCE | Source: Ambulatory Visit | Attending: Adult Health | Admitting: Adult Health

## 2018-12-05 ENCOUNTER — Ambulatory Visit (INDEPENDENT_AMBULATORY_CARE_PROVIDER_SITE_OTHER): Payer: No Typology Code available for payment source | Admitting: Adult Health

## 2018-12-05 VITALS — BP 130/74 | HR 59 | Temp 97.3°F | Ht 69.5 in | Wt 256.0 lb

## 2018-12-05 DIAGNOSIS — R1011 Right upper quadrant pain: Secondary | ICD-10-CM | POA: Diagnosis not present

## 2018-12-05 DIAGNOSIS — R399 Unspecified symptoms and signs involving the genitourinary system: Secondary | ICD-10-CM

## 2018-12-05 DIAGNOSIS — R0789 Other chest pain: Secondary | ICD-10-CM

## 2018-12-05 DIAGNOSIS — Z122 Encounter for screening for malignant neoplasm of respiratory organs: Secondary | ICD-10-CM

## 2018-12-05 DIAGNOSIS — Z87891 Personal history of nicotine dependence: Secondary | ICD-10-CM

## 2018-12-05 MED ORDER — PREDNISONE 20 MG PO TABS: ORAL_TABLET | ORAL | 0 refills | Status: DC

## 2018-12-05 NOTE — Patient Instructions (Addendum)
Try picking up salonpas (lidocaine patch) and can try this    Chest Wall Pain Chest wall pain is pain in or around the bones and muscles of your chest. Sometimes, an injury causes this pain. Excessive coughing or overuse of arm and chest muscles may also cause chest wall pain. Sometimes, the cause may not be known. This pain may take several weeks or longer to get better. Follow these instructions at home: Managing pain, stiffness, and swelling   If directed, put ice on the painful area: ? Put ice in a plastic bag. ? Place a towel between your skin and the bag. ? Leave the ice on for 20 minutes, 2-3 times per day. Activity  Rest as told by your health care provider.  Avoid activities that cause pain. These include any activities that use your chest muscles or your abdominal and side muscles to lift heavy items. Ask your health care provider what activities are safe for you. General instructions   Take over-the-counter and prescription medicines only as told by your health care provider.  Do not use any products that contain nicotine or tobacco, such as cigarettes, e-cigarettes, and chewing tobacco. These can delay healing after injury. If you need help quitting, ask your health care provider.  Keep all follow-up visits as told by your health care provider. This is important. Contact a health care provider if:  You have a fever.  Your chest pain becomes worse.  You have new symptoms. Get help right away if:  You have nausea or vomiting.  You feel sweaty or light-headed.  You have a cough with mucus from your lungs (sputum) or you cough up blood.  You develop shortness of breath. These symptoms may represent a serious problem that is an emergency. Do not wait to see if the symptoms will go away. Get medical help right away. Call your local emergency services (911 in the [place] Do not drive yourself to the hospital. Summary  Chest wall pain is pain in or around the bones and  muscles of your chest.  Depending on the cause, it may be treated with ice, rest, medicines, and avoiding activities that cause pain.  Contact a health care provider if you have a fever, worsening chest pain, or new symptoms.  Get help right away if you feel light-headed or you develop shortness of breath. These symptoms may be an emergency. This information is not intended to replace advice given to you by your health care provider. Make sure you discuss any questions you have with your health care provider. Document Released: 10/05/2005 Document Revised: 04/07/2018 Document Reviewed: 04/07/2018 Elsevier Interactive Patient Education  2019 [employer].

## 2018-12-05 NOTE — Progress Notes (Signed)
Assessment and Plan:  George Montgomery was seen today for flank pain and wheezing.  Diagnoses and all orders for this visit:  RUQ pain Unclear whether this is RUQ/chest wall pain, will get labs to narrow down etiology, CXR due to chest wall pain that is progressive, however suspect musculoskeletal He was prescribed hydrocodone/acet by Dr. Melford Aase already recently but didn't disclose this initially today, he is additionally taking xanax and phentermine - will not refill opioid Will defer on imaging pending lab workup The patient was advised to call immediately if he has any concerning symptoms in the interval. The patient voices understanding of current treatment options and is in agreement with the current care plan.The patient knows to call the clinic with any problems, questions or concerns or go to the ER if any further progression of symptoms.  -     CBC with Differential/Platelet -     COMPLETE METABOLIC PANEL WITH GFR -     Amylase -     Lipase -     Urinalysis, Routine w reflex microscopic  Chest wall pain Advised stop ibuprofen, will try prednisone, rest, ice, trial of OTC lidocaine patches, can try voltaren gel next if needed -     DG Chest 2 View; Futuret  Encounter for screening for malignant neoplasm of lung in former smoker who quit in past 15 years with 30 pack year history or greater -     CT CHEST LUNG CA SCREEN LOW DOSE W/O CM; Future  Other orders -     predniSONE (DELTASONE) 20 MG tablet; 2 tablets daily for 3 days, 1 tablet daily for 4 days.   Further disposition pending results of labs. Discussed med's effects and SE's.   Over 30 minutes of exam, counseling, chart review, and critical decision making was performed.   Future Appointments  Date Time Provider [place]  12/12/2018  3:30 PM Unk Pinto, MD [facility] None  05/02/2019  2:30 PM Burtis Junes, NP [facility]  07/05/2019 10:00 AM Unk Pinto, MD [facility] None  11/14/2019  3:00 PM  Star Age, MD [facility] None    ------------------------------------------------------------------------------------------------------------------   HPI BP 130/74   Pulse (!) 59   Temp (!) 97.3 F (36.3 C)   Ht 5' 9.5" (1.765 m)   Wt 256 lb (116.1 kg)   SpO2 97%   BMI 37.26 kg/m   62 y.o.male, former smoker, 40 pack year history, quit in 2014 presents for RUQ abdominal/R lower chest pain ongoing since 10/26/2018, noted while he was splitting wood and initially thought he strained a muscle, but has been progressive/getting worse. He reports this is intermittent, worse with bending over, twisting, lifting. He reports pain is brief, with triggers only. Describes as superficial, brief/sharp, 6/10. Denies any accompanying symptoms. Triggered by movements, not associated with exertion, eating. Denies n/v/d/constipation, changes in stool patterns.   He has taken ibuprofen 500 mg x 2 for pain without notable improvement, he takes 15 mg meloxicam  Daily (advised to stop taking together), he also tried wife's hydrocodone which did relieve pain. (however on review of PDMP, appears he was prescribed 10 tabs of  He drinks 2 glasses of wine some evenings. Denies reflux symptoms since on ranitidine and omeprazole.    He also reports he has been wheezing intermittently for several months, was off of allergy medication and has since restarted, no dyspnea, mild intermittently productive cough, but denies dyspnea/fever/chills/fatigue. Centrilobular emphysema noted on last screening CT.   He is very anxious regarding possible  lung cancer. He had neg low dose screening CT 03/2017 but is overdue for annual follow up. Denies fatigue, unintended weight loss.   He reports a few weeks of new sensation of incomplete bladder emptying, frequency. He denies burning with urination, changes in urine character. He denies having to strain to start stream, no split stream, no nocturia.   He has hx of appendix surgery.   BMI  is Body mass index is 37.26 kg/m. Wt Readings from Last 3 Encounters:  12/05/18 256 lb (116.1 kg)  11/08/18 250 lb (113.4 kg)  09/07/18 255 lb 6.4 oz (115.8 kg)     Past Medical History:  Diagnosis Date  . Arthritis   . Environmental allergies   . OSA on CPAP 08/01/2014  . Reflux      Allergies  Allergen Reactions  . Bee Venom Anaphylaxis  . Lisinopril Other (See Comments)    Patient intolerant of drug which caused his tongue to tingle.  . Celebrex [Celecoxib]     Current Outpatient Medications on File Prior to Visit  Medication Sig  . albuterol (PROVENTIL HFA;VENTOLIN HFA) 108 (90 Base) MCG/ACT inhaler Use 2 inhalations 15 - 20 minutes apart every 4 hours as needed to Rescue Asthma  . ALPRAZolam (XANAX) 0.5 MG tablet Take 1/2-1 tab up to three times a day as needed for anxiety and sleep. Do not take concurrently with pain medication.  . bisoprolol (ZEBETA) 10 MG tablet Take 1/2 to 1 tablet daily for BP  . Cholecalciferol (VITAMIN D PO) Take 5,000 Units by mouth 3 (three) times daily.  Marland Kitchen EPINEPHrine 0.3 mg/0.3 mL IJ SOAJ injection Inject 0.3 mg into the muscle once. prn  . fenofibrate micronized (LOFIBRA) 134 MG capsule TAKE 1 CAPSULE BY MOUTH ONCE DAILY BEFORE BREAKFAST FOR  BLOOD  FATS  . meloxicam (MOBIC) 15 MG tablet TAKE 1/2 TO 1 TABLET BY MOUTH WITH FOOD FOR PAIN AND INFLAMMATION  . montelukast (SINGULAIR) 10 MG tablet TAKE 1 TABLET BY MOUTH AT BEDTIME  . omeprazole (PRILOSEC) 20 MG capsule TAKE 1 CAPSULE BY MOUTH ONCE DAILY  . ranitidine (ZANTAC) 300 MG tablet Take 1 to 2 tablets daily for heartburn & reflux to allow wean and transition from PPI / pantoprazole  . sertraline (ZOLOFT) 50 MG tablet TAKE 1 TABLET BY MOUTH ONCE DAILY  . sildenafil (REVATIO) 20 MG tablet Take 1 to 5 tabs daily as needed for XXXX  . traMADol (ULTRAM) 50 MG tablet Take 50 mg by mouth every 6 (six) hours as needed for moderate pain.   . valsartan-hydrochlorothiazide (DIOVAN HCT) 80-12.5 MG  tablet Take 1 tablet by mouth daily.  . phentermine (ADIPEX-P) 37.5 MG tablet TAKE 1 TABLET EVERY MORNING AND 1/2 TABLET AT 2PM FOR DIETING AND WEIGHT LOSSS (Patient not taking: Reported on 12/05/2018)   No current facility-administered medications on file prior to visit.     ROS: all negative except above.   Physical Exam:  BP 130/74   Pulse (!) 59   Temp (!) 97.3 F (36.3 C)   Ht 5' 9.5" (1.765 m)   Wt 256 lb (116.1 kg)   SpO2 97%   BMI 37.26 kg/m   General Appearance: Well nourished, in no apparent distress. Eyes: PERRLA, EOMs, conjunctiva no swelling or erythema Sinuses: No Frontal/maxillary tenderness ENT/Mouth: Ext aud canals clear, TMs without erythema, bulging. No erythema, swelling, or exudate on post pharynx.  Tonsils not swollen or erythematous. Hearing normal.  Neck: Supple, thyroid normal.  Respiratory: Respiratory effort normal, BS  equal bilaterally without rales, rhonchi, wheezing or stridor.  Cardio: RRR with no MRGs. Brisk peripheral pulses without edema.  Abdomen: Soft, + BS.  Vague RUQ tenderness, no guarding, rebound, no epigastric pain, no palpable hernias, masses. Lymphatics: Non tender without lymphadenopathy.  Musculoskeletal: Some tenderness over R lower chest wall and upper abdomen, no point tenderness, Symmetrical strength, normal gait.  Skin: Warm, dry without rashes, lesions, ecchymosis.  Neuro: Cranial nerves intact. Normal muscle tone, no cerebellar symptoms. Sensation intact.  Psych: Awake and oriented X 3, normal/mildly anxious affect, Insight and Judgment appropriate.     Izora Ribas, NP 11:10 AM [facility]

## 2018-12-06 LAB — CBC WITH DIFFERENTIAL/PLATELET
Absolute Monocytes: 453 cells/uL (ref 200–950)
BASOS ABS: 31 {cells}/uL (ref 0–200)
Basophils Relative: 0.5 %
Eosinophils Absolute: 112 cells/uL (ref 15–500)
Eosinophils Relative: 1.8 %
HCT: 40.3 % (ref 38.5–50.0)
Hemoglobin: 14.1 g/dL (ref 13.2–17.1)
Lymphs Abs: 1649 cells/uL (ref 850–3900)
MCH: 31.7 pg (ref 27.0–33.0)
MCHC: 35 g/dL (ref 32.0–36.0)
MCV: 90.6 fL (ref 80.0–100.0)
MPV: 10.7 fL (ref 7.5–12.5)
Monocytes Relative: 7.3 %
NEUTROS ABS: 3956 {cells}/uL (ref 1500–7800)
Neutrophils Relative %: 63.8 %
Platelets: 212 10*3/uL (ref 140–400)
RBC: 4.45 10*6/uL (ref 4.20–5.80)
RDW: 12.6 % (ref 11.0–15.0)
Total Lymphocyte: 26.6 %
WBC: 6.2 10*3/uL (ref 3.8–10.8)

## 2018-12-06 LAB — COMPLETE METABOLIC PANEL WITH GFR
AG Ratio: 1.7 (calc) (ref 1.0–2.5)
ALBUMIN MSPROF: 4.4 g/dL (ref 3.6–5.1)
ALKALINE PHOSPHATASE (APISO): 74 U/L (ref 35–144)
ALT: 32 U/L (ref 9–46)
AST: 23 U/L (ref 10–35)
BUN: 18 mg/dL (ref 7–25)
CO2: 31 mmol/L (ref 20–32)
Calcium: 9.3 mg/dL (ref 8.6–10.3)
Chloride: 106 mmol/L (ref 98–110)
Creat: 1 mg/dL (ref 0.70–1.25)
GFR, Est African American: 92 mL/min/{1.73_m2} (ref >=60)
GFR, Est Non African American: 80 mL/min/{1.73_m2} (ref >=60)
Globulin: 2.6 g/dL (calc) (ref 1.9–3.7)
Glucose, Bld: 116 mg/dL — ABNORMAL HIGH (ref 65–99)
Potassium: 4.2 mmol/L (ref 3.5–5.3)
Sodium: 142 mmol/L (ref 135–146)
Total Bilirubin: 0.5 mg/dL (ref 0.2–1.2)
Total Protein: 7 g/dL (ref 6.1–8.1)

## 2018-12-06 LAB — URINALYSIS, ROUTINE W REFLEX MICROSCOPIC
Bilirubin Urine: NEGATIVE
GLUCOSE, UA: NEGATIVE
Hgb urine dipstick: NEGATIVE
Ketones, ur: NEGATIVE
Leukocytes,Ua: NEGATIVE
Nitrite: NEGATIVE
Protein, ur: NEGATIVE
Specific Gravity, Urine: 1.022 (ref 1.001–1.03)
pH: 6.5 (ref 5.0–8.0)

## 2018-12-06 LAB — AMYLASE: Amylase: 34 U/L (ref 21–101)

## 2018-12-06 LAB — LIPASE: Lipase: 61 U/L — ABNORMAL HIGH (ref 7–60)

## 2018-12-08 ENCOUNTER — Encounter (INDEPENDENT_AMBULATORY_CARE_PROVIDER_SITE_OTHER): Payer: Self-pay

## 2018-12-08 ENCOUNTER — Encounter (INDEPENDENT_AMBULATORY_CARE_PROVIDER_SITE_OTHER): Payer: PRIVATE HEALTH INSURANCE

## 2018-12-11 ENCOUNTER — Encounter: Payer: Self-pay | Admitting: Internal Medicine

## 2018-12-11 NOTE — Progress Notes (Signed)
        C  A  N  C  E  L  L  E  D   morning   Of   App't                                                                                                                                                                                                                         This [name] presents for 6 month follow up with HTN, HLD, Pre-Diabetes, Testosterone Deficiency  and Vitamin D Deficiency. Patient has GERD controlled on his meds. In 2018, he had evaluation by Dr Elsworth Soho for Dyspnea with Normal Spirometry & Std ETT and was dx'd with deconditioning.     Patient is treated for HTN (09/2014) & BP has been controlled at home. Today's  . Patient has had no complaints of any cardiac type chest pain, palpitations, dyspnea / orthopnea / PND, dizziness, claudication, or dependent edema.     Hyperlipidemia is controlled with diet & meds. Patient denies myalgias or other med SE's. Last Lipids were  Lab Results  Component Value Date   CHOL 158 09/07/2018   HDL 47 09/07/2018   LDLCALC 90 09/07/2018   TRIG 109 09/07/2018   CHOLHDL 3.4 09/07/2018      Also, the patient has Morbid Obesity (BMI 35+) and history of PreDiabetes / Insulin Resistance (A1c 5.6%/Insulin 29 / Jan 2016) and has had no symptoms of reactive hypoglycemia, diabetic polys, paresthesias or visual blurring.  Last A1c was Normal & at goal: Lab Results  Component Value Date   HGBA1C 5.4 10/29/2017      Further, the patient also has history of Vitamin D Deficiency ("21" / 2016)  and supplements vitamin D without any suspected side-effects. Last vitamin D was still very low (goal 70-100): Lab Results  Component Value Date   VD25OH 32 10/29/2017

## 2018-12-12 ENCOUNTER — Ambulatory Visit: Payer: Self-pay | Admitting: Internal Medicine

## 2018-12-12 ENCOUNTER — Encounter (INDEPENDENT_AMBULATORY_CARE_PROVIDER_SITE_OTHER): Payer: PRIVATE HEALTH INSURANCE

## 2018-12-14 ENCOUNTER — Other Ambulatory Visit: Payer: Self-pay | Admitting: Adult Health

## 2018-12-14 MED ORDER — GABAPENTIN 300 MG PO CAPS: 300.0000 mg | ORAL_CAPSULE | Freq: Three times a day (TID) | ORAL | 1 refills | Status: DC | PRN

## 2018-12-15 ENCOUNTER — Ambulatory Visit: Payer: Self-pay | Admitting: Internal Medicine

## 2018-12-15 ENCOUNTER — Ambulatory Visit: Payer: Self-pay | Admitting: Adult Health

## 2018-12-15 ENCOUNTER — Ambulatory Visit: Payer: No Typology Code available for payment source | Admitting: Internal Medicine

## 2018-12-15 VITALS — BP 154/76 | HR 48 | Temp 97.5°F | Resp 18 | Ht 69.0 in | Wt 257.4 lb

## 2018-12-15 DIAGNOSIS — E782 Mixed hyperlipidemia: Secondary | ICD-10-CM | POA: Diagnosis not present

## 2018-12-15 DIAGNOSIS — E559 Vitamin D deficiency, unspecified: Secondary | ICD-10-CM

## 2018-12-15 DIAGNOSIS — K219 Gastro-esophageal reflux disease without esophagitis: Secondary | ICD-10-CM

## 2018-12-15 DIAGNOSIS — R7309 Other abnormal glucose: Secondary | ICD-10-CM

## 2018-12-15 DIAGNOSIS — I10 Essential (primary) hypertension: Secondary | ICD-10-CM

## 2018-12-15 DIAGNOSIS — R079 Chest pain, unspecified: Secondary | ICD-10-CM

## 2018-12-15 DIAGNOSIS — Z79899 Other long term (current) drug therapy: Secondary | ICD-10-CM | POA: Diagnosis not present

## 2018-12-15 DIAGNOSIS — Z6838 Body mass index (BMI) 38.0-38.9, adult: Secondary | ICD-10-CM

## 2018-12-15 DIAGNOSIS — E349 Endocrine disorder, unspecified: Secondary | ICD-10-CM | POA: Diagnosis not present

## 2018-12-15 MED ORDER — TOPIRAMATE 50 MG PO TABS: ORAL_TABLET | ORAL | 1 refills | Status: DC

## 2018-12-15 NOTE — Progress Notes (Signed)
This [name] presents for 6 month follow up with HTN, HLD, Pre-Diabetes, Testosterone Deficiency  and Vitamin D Deficiency. Patient has GERD controlled on his meds. In 2018, he had evaluation by Dr Elsworth Soho for Dyspnea with Normal Spirometry & Std ETT and was dx'd with deconditioning.     Patient is treated for HTN (2015) & BP has been controlled at home. Today's BP is elevated at 154/86. Patient does report non-exertional positional chest wall pains Patient has had no complaints of any exertional cardiac type chest pain, palpitations, dyspnea / orthopnea / PND, dizziness, claudication, or dependent edema.     Hyperlipidemia is controlled with diet & meds. Patient denies myalgias or other med SE's. Last Lipids were  Not at goal: Lab Results  Component Value Date   CHOL 158 09/07/2018   HDL 47 09/07/2018   LDLCALC 90 09/07/2018   TRIG 109 09/07/2018   CHOLHDL 3.4 09/07/2018      Also, the patient has Morbid Obesity (BMI 38+) and history of PreDiabetes Insulin Resistance (A1c 5.6% / elev. insulin 29. Jan 2016) and has had no symptoms of reactive hypoglycemia, diabetic polys, paresthesias or visual blurring.  Last A1c was  Lab Results  Component Value Date   HGBA1C 5.4 10/29/2017      Further, the patient also has history of Vitamin D Deficiency("21" / 2016)   and does not supplement vitamin D as recommended. Last vitamin D was still very low. Lab Results  Component Value Date   VD25OH 32 10/29/2017   Current Outpatient Medications on File Prior to Visit  Medication Sig  . albuterol (PROVENTIL HFA;VENTOLIN HFA) 108 (90 Base) MCG/ACT inhaler Use 2 inhalations 15 - 20 minutes apart every 4 hours as needed to Rescue Asthma  . ALPRAZolam (XANAX) 0.5 MG tablet Take 1/2-1 tab up to three times a day as needed for anxiety and sleep. Do not take concurrently with pain medication.  . bisoprolol (ZEBETA) 10 MG tablet Take 1/2 to 1 tablet daily for BP  . EPINEPHrine 0.3 mg/0.3 mL IJ  SOAJ injection Inject 0.3 mg into the muscle once. prn  . fenofibrate micronized (LOFIBRA) 134 MG capsule TAKE 1 CAPSULE BY MOUTH ONCE DAILY BEFORE BREAKFAST FOR  BLOOD  FATS  . gabapentin (NEURONTIN) 300 MG capsule Take 1 capsule (300 mg total) by mouth 3 (three) times daily as needed. For pain.  . meloxicam (MOBIC) 15 MG tablet TAKE 1/2 TO 1 TABLET BY MOUTH WITH FOOD FOR PAIN AND INFLAMMATION  . montelukast (SINGULAIR) 10 MG tablet TAKE 1 TABLET BY MOUTH AT BEDTIME  . omeprazole (PRILOSEC) 20 MG capsule TAKE 1 CAPSULE BY MOUTH ONCE DAILY  . phentermine (ADIPEX-P) 37.5 MG tablet TAKE 1 TABLET EVERY MORNING AND 1/2 TABLET AT 2PM FOR DIETING AND WEIGHT LOSSS  . ranitidine (ZANTAC) 300 MG tablet Take 1 to 2 tablets daily for heartburn & reflux to allow wean and transition from PPI / pantoprazole  . sertraline (ZOLOFT) 50 MG tablet TAKE 1 TABLET BY MOUTH ONCE DAILY  . sildenafil (REVATIO) 20 MG tablet Take 1 to 5 tabs daily as needed for XXXX  . valsartan-hydrochlorothiazide (DIOVAN HCT) 80-12.5 MG tablet Take 1 tablet by mouth daily.  . Cholecalciferol (VITAMIN D PO) Take 5,000 Units by mouth 3 (three) times daily.   No current facility-administered medications on file prior to visit.    Allergies  Allergen Reactions  . Bee Venom Anaphylaxis  . Lisinopril Other (See Comments)  Patient intolerant of drug which caused his tongue to tingle.  . Celebrex [Celecoxib]    PMHx:   Past Medical History:  Diagnosis Date  . Arthritis   . Environmental allergies   . OSA on CPAP 08/01/2014  . Reflux    Immunization History  Administered Date(s) Administered  . Influenza Inj Mdck Quad With Preservative 08/19/2017  . Influenza Split 08/06/2015  . Influenza,inj,quad, With Preservative 08/20/2016  . PPD Test 06/03/2018  . Pneumococcal Polysaccharide-23 08/20/2016  . Tdap 11/06/2016  . Zoster 11/06/2016   Past Surgical History:  Procedure Laterality Date  . APPENDECTOMY  07/2012  . HEMORRHOID  SURGERY  09/2012   FHx:    Reviewed / unchanged  SHx:    Reviewed / unchanged   Systems Review:  Constitutional: Denies fever, chills, wt changes, headaches, insomnia, fatigue, night sweats, change in appetite. Eyes: Denies redness, blurred vision, diplopia, discharge, itchy, watery eyes.  ENT: Denies discharge, congestion, post nasal drip, epistaxis, sore throat, earache, hearing loss, dental pain, tinnitus, vertigo, sinus pain, snoring.  CV: Denies chest pain, palpitations, irregular heartbeat, syncope, dyspnea, diaphoresis, orthopnea, PND, claudication or edema. Respiratory: denies cough, dyspnea, DOE, pleurisy, hoarseness, laryngitis, wheezing.  Gastrointestinal: Denies dysphagia, odynophagia, heartburn, reflux, water brash, abdominal pain or cramps, nausea, vomiting, bloating, diarrhea, constipation, hematemesis, melena, hematochezia  or hemorrhoids. Genitourinary: Denies dysuria, frequency, urgency, nocturia, hesitancy, discharge, hematuria or flank pain. Musculoskeletal: Denies arthralgias, myalgias, stiffness, jt. swelling, pain, limping or strain/sprain.  Skin: Denies pruritus, rash, hives, warts, acne, eczema or change in skin lesion(s). Neuro: No weakness, tremor, incoordination, spasms, paresthesia or pain. Psychiatric: Denies confusion, memory loss or sensory loss. Endo: Denies change in weight, skin or hair change.  Heme/Lymph: No excessive bleeding, bruising or enlarged lymph nodes.  Physical Exam  BP (!) 154/76   Pulse (!) 48   Temp (!) 97.5 F (36.4 C)   Resp 18   Ht 5\' 9"  (1.753 m)   Wt 257 lb 6.4 oz (116.8 kg)   BMI 38.01 kg/m   Appears  well nourished, well groomed  and in no distress.  Eyes: PERRLA, EOMs, conjunctiva no swelling or erythema. Sinuses: No frontal/maxillary tenderness ENT/Mouth: EAC's clear, TM's nl w/o erythema, bulging. Nares clear w/o erythema, swelling, exudates. Oropharynx clear without erythema or exudates. Oral hygiene is good. Tongue  normal, non obstructing. Hearing intact.  Neck: Supple. Thyroid not palpable. Car 2+/2+ without bruits, nodes or JVD. Chest: Respirations nl with BS clear & equal w/o rales, rhonchi, wheezing or stridor.  Cor: Heart sounds normal w/ regular rate and rhythm without sig. murmurs, gallops, clicks or rubs. Peripheral pulses normal and equal  without edema.  Abdomen: Soft & bowel sounds normal. Non-tender w/o guarding, rebound, hernias, masses or organomegaly.  Lymphatics: Unremarkable.  Musculoskeletal: Full ROM all peripheral extremities, joint stability, 5/5 strength and normal gait.  Skin: Warm, dry without exposed rashes, lesions or ecchymosis apparent.  Neuro: Cranial nerves intact, reflexes equal bilaterally. Sensory-motor testing grossly intact. Tendon reflexes grossly intact.  Pysch: Alert & oriented x 3.  Insight and judgement nl & appropriate. No ideations.  Assessment and Plan:  1. Essential hypertension  - Continue medication, monitor blood pressure at home.  - Continue DASH diet.  Reminder to go to the ER if any CP,  SOB, nausea, dizziness, severe HA, changes vision/speech.  - Magnesium - TSH  2. Hyperlipidemia, mixed  - Continue diet/meds, exercise,& lifestyle modifications.  - Continue monitor periodic cholesterol/liver & renal functions   - Lipid panel -  TSH  3. Abnormal glucose  - Continue diet, exercise  - Lifestyle modifications.  - Monitor appropriate labs.  - Hemoglobin A1c - Insulin, random  4. Vitamin D deficiency  - Continue supplementation.  - VITAMIN D 25 Hydroxyl  5. Gastroesophageal reflux disease   6. Class 2 severe obesity due to excess calories with serious comorbidity and body mass index (BMI) of 38.0 to 38.9 in adult (HCC)  - topiramate 50 MG tab; Take 1/2 to 1  tablet 2 x / day at Suppertime & Bedtime for Dieting & Weight Loss  Dispense: 180 tablet; Refill: 1  7. Testosterone deficiency  - Testosterone  8. Chest pain,  musculoskeletal chest wall   9. Medication management  - Magnesium - Lipid panel - TSH - Hemoglobin A1c - Insulin, random - VITAMIN D 25 Hydroxyl - Testosterone       Discussed  regular exercise, BP monitoring, weight control to achieve/maintain BMI less than 25 and discussed med and SE's. Recommended labs to assess and monitor clinical status with further disposition pending results of labs. Over 30 minutes of exam, counseling, chart review was performed.

## 2018-12-15 NOTE — Patient Instructions (Signed)

## 2018-12-16 ENCOUNTER — Other Ambulatory Visit: Payer: Self-pay | Admitting: Internal Medicine

## 2018-12-16 LAB — TSH: TSH: 0.76 mIU/L (ref 0.40–4.50)

## 2018-12-16 LAB — TESTOSTERONE: Testosterone: 292 ng/dL (ref 250–827)

## 2018-12-16 LAB — LIPID PANEL
Cholesterol: 160 mg/dL (ref <200)
HDL: 39 mg/dL — ABNORMAL LOW (ref >=40)
LDL Cholesterol (Calc): 91 mg/dL (calc)
Non-HDL Cholesterol (Calc): 121 mg/dL (calc) (ref <130)
Total CHOL/HDL Ratio: 4.1 (calc) (ref <5.0)
Triglycerides: 207 mg/dL — ABNORMAL HIGH (ref <150)

## 2018-12-16 LAB — INSULIN, RANDOM: Insulin: 55.7 u[IU]/mL — ABNORMAL HIGH

## 2018-12-16 LAB — HEMOGLOBIN A1C
Hgb A1c MFr Bld: 5.9 % of total Hgb — ABNORMAL HIGH (ref <5.7)
Mean Plasma Glucose: 123 (calc)
eAG (mmol/L): 6.8 (calc)

## 2018-12-16 LAB — MAGNESIUM: Magnesium: 2.2 mg/dL (ref 1.5–2.5)

## 2018-12-16 LAB — VITAMIN D 25 HYDROXY (VIT D DEFICIENCY, FRACTURES): Vit D, 25-Hydroxy: 24 ng/mL — ABNORMAL LOW (ref 30–100)

## 2018-12-18 ENCOUNTER — Encounter: Payer: Self-pay | Admitting: Internal Medicine

## 2018-12-22 ENCOUNTER — Encounter (INDEPENDENT_AMBULATORY_CARE_PROVIDER_SITE_OTHER): Payer: Self-pay | Admitting: Family Medicine

## 2018-12-22 ENCOUNTER — Ambulatory Visit (INDEPENDENT_AMBULATORY_CARE_PROVIDER_SITE_OTHER): Payer: PRIVATE HEALTH INSURANCE | Admitting: Family Medicine

## 2018-12-22 VITALS — BP 119/70 | HR 52 | Ht 69.0 in | Wt 247.0 lb

## 2018-12-22 DIAGNOSIS — Z9189 Other specified personal risk factors, not elsewhere classified: Secondary | ICD-10-CM

## 2018-12-22 DIAGNOSIS — R0602 Shortness of breath: Secondary | ICD-10-CM | POA: Diagnosis not present

## 2018-12-22 DIAGNOSIS — Z1331 Encounter for screening for depression: Secondary | ICD-10-CM | POA: Diagnosis not present

## 2018-12-22 DIAGNOSIS — R5383 Other fatigue: Secondary | ICD-10-CM | POA: Diagnosis not present

## 2018-12-22 DIAGNOSIS — E7849 Other hyperlipidemia: Secondary | ICD-10-CM

## 2018-12-22 DIAGNOSIS — I10 Essential (primary) hypertension: Secondary | ICD-10-CM

## 2018-12-22 DIAGNOSIS — R739 Hyperglycemia, unspecified: Secondary | ICD-10-CM

## 2018-12-22 DIAGNOSIS — Z6836 Body mass index (BMI) 36.0-36.9, adult: Secondary | ICD-10-CM

## 2018-12-22 DIAGNOSIS — Z0289 Encounter for other administrative examinations: Secondary | ICD-10-CM

## 2018-12-23 LAB — COMPREHENSIVE METABOLIC PANEL
ALT: 32 IU/L (ref 0–44)
AST: 19 IU/L (ref 0–40)
Albumin/Globulin Ratio: 1.9 (ref 1.2–2.2)
Albumin: 4.7 g/dL (ref 3.8–4.8)
Alkaline Phosphatase: 71 IU/L (ref 39–117)
BUN/Creatinine Ratio: 23 (ref 10–24)
BUN: 22 mg/dL (ref 8–27)
Bilirubin Total: 0.6 mg/dL (ref 0.0–1.2)
CO2: 23 mmol/L (ref 20–29)
Calcium: 9.6 mg/dL (ref 8.6–10.2)
Chloride: 101 mmol/L (ref 96–106)
Creatinine, Ser: 0.96 mg/dL (ref 0.76–1.27)
GFR calc non Af Amer: 84 mL/min/{1.73_m2} (ref >59)
GFR, EST AFRICAN AMERICAN: 97 mL/min/{1.73_m2} (ref >59)
GLOBULIN, TOTAL: 2.5 g/dL (ref 1.5–4.5)
Glucose: 104 mg/dL — ABNORMAL HIGH (ref 65–99)
Potassium: 4.3 mmol/L (ref 3.5–5.2)
Sodium: 140 mmol/L (ref 134–144)
TOTAL PROTEIN: 7.2 g/dL (ref 6.0–8.5)

## 2018-12-23 LAB — VITAMIN B12: Vitamin B-12: 582 pg/mL (ref 232–1245)

## 2018-12-23 LAB — FOLATE: Folate: 7.3 ng/mL (ref >3.0)

## 2018-12-23 LAB — TSH: TSH: 1.38 u[IU]/mL (ref 0.450–4.500)

## 2018-12-23 LAB — T4, FREE: Free T4: 0.93 ng/dL (ref 0.82–1.77)

## 2018-12-23 LAB — T3: T3, Total: 106 ng/dL (ref 71–180)

## 2018-12-26 ENCOUNTER — Encounter (INDEPENDENT_AMBULATORY_CARE_PROVIDER_SITE_OTHER): Payer: Self-pay | Admitting: Family Medicine

## 2018-12-26 NOTE — Progress Notes (Signed)
.  Office: [phone]  /  Fax: [phone]   HPI:   Chief Complaint: OBESITY  George Montgomery (MR# [id number]) is a 62 y.o. male who presents on 12/22/2018 for obesity evaluation and treatment. Current BMI is Body mass index is 36.48 kg/m.  George Montgomery has struggled with obesity for years and has been unsuccessful in either losing weight or maintaining long term weight loss. George Montgomery attended our information session and states he is currently in the action stage of change and ready to dedicate time achieving and maintaining a healthier weight.   George Montgomery states his family eats meals together he thinks his family will eat healthier with him his desired weight loss is 57 lbs he started gaining weight in 2015  his heaviest weight ever was 250 lbs. he snacks frequently in the evenings he wakes up frequently in the middle of the night to eat he frequently makes poor food choices he has problems with excessive hunger  he frequently eats larger portions than normal  he has binge eating behaviors he struggles with emotional eating   George Montgomery has been on phentermine and topiramate prescribed by his PCP. He feels jittery and stopped 2 days ago.   Fatigue George Montgomery feels his energy is lower than it should be. This has worsened with weight gain and has not worsened recently. George Montgomery admits to daytime somnolence. George Montgomery is at risk for obstructive sleep apnea. Patent has a history of symptoms of Epworth sleepiness scale, morning headache and hypertension. George Montgomery generally gets 5 hours of sleep per night, and states they generally have restless sleep. Snoring is present. Apneic episodes are present. Epworth Sleepiness Score is 11.  Dyspnea on exertion [name] notes increasing shortness of breath with exercising and seems to be worsening over time with weight gain. He notes getting out of breath sooner with activity than he used to. This has not gotten worse recently.[name] denies orthopnea.  Hyperglycemia George Montgomery has a history of  an elevated A1c and fasting Insulin without a diagnosis of diabetes. He admits to polyphagia.  At risk for diabetes George Montgomery is at higher than average risk for developing diabetes due to his hyperglycemia and obesity. He currently denies polyuria or polydipsia.  Hyperlipidemia George Montgomery has hyperlipidemia and is on fenofibrate, but is not on a statin. He has been working on his diet to improve his cholesterol levels with intensive lifestyle modification including a low saturated fat diet, exercise and weight loss. He denies any chest pain.  Hypertension George Montgomery is a 63 y.o. male with hypertension. George Montgomery blood pressure is currently stable today as he is on valsartan HCTZ 80-12.5mg  and bisoprolol 10mg . He is working on weight loss to help control his blood pressure with the goal of decreasing his risk of heart attack and stroke. [name] denies chest pain or lightheadedness.  Depression Screen George Montgomery's Food and Mood (modified PHQ-9) score was 14. Depression screen PHQ 2/9 12/22/2018  Decreased Interest 1  Down, Depressed, Hopeless 1  PHQ - 2 Score 2  Altered sleeping 3  Tired, decreased energy 2  Change in appetite 3  Feeling bad or failure about yourself  1  Trouble concentrating 2  Moving slowly or fidgety/restless 1  Suicidal thoughts 0  PHQ-9 Score 14  Difficult doing work/chores Somewhat difficult   ASSESSMENT AND PLAN:  Other fatigue - Plan: EKG 12-Lead, Vitamin B12, Folate, T3, T4, free, TSH  Shortness of breath on exertion  Hyperglycemia - Plan: Comprehensive metabolic panel  Other hyperlipidemia  Essential hypertension  At  risk for diabetes mellitus  Depression screening  Class 2 severe obesity with serious comorbidity and body mass index (BMI) of 36.0 to 36.9 in adult, unspecified obesity type [facility])  PLAN:  Fatigue George Montgomery was informed that his fatigue may be related to obesity, depression or many other causes. Labs will be ordered, and in the meanwhile George Montgomery has agreed to  work on diet, exercise and weight loss to help with fatigue. Proper sleep hygiene was discussed including the need for 7-8 hours of quality sleep each night. A sleep study was not ordered based on symptoms and Epworth score. An EKG and an indirect calorimetry was ordered today.  Dyspnea on exertion George Montgomery shortness of breath appears to be obesity related and exercise induced. He has agreed to work on weight loss and gradually increase exercise to treat his exercise induced shortness of breath. If George Montgomery follows our instructions and loses weight without improvement of his shortness of breath, we will plan to refer to pulmonology. We will monitor this condition regularly. George Montgomery agrees to this plan.  Hyperglycemia Fasting labs will be obtained and results with be discussed with George Montgomery in 2 weeks at his follow up visit. In the meanwhile George Montgomery was started on a lower simple carbohydrate diet and will work on weight loss efforts. George Montgomery agrees to start his diet prescription and he will follow up in 2 weeks.  Diabetes risk counseling George Montgomery was given extended (15 minutes) diabetes prevention counseling today. He is 63 y.o. male and has risk factors for diabetes including hyperglycemia and obesity. We discussed intensive lifestyle modifications today with an emphasis on weight loss as well as increasing exercise and decreasing simple carbohydrates in his diet.  Hyperlipidemia George Montgomery was informed of the American Heart Association Guidelines emphasizing intensive lifestyle modifications as the first line treatment for hyperlipidemia. We discussed many lifestyle modifications today in depth, and George Montgomery will continue to work on decreasing saturated fats such as fatty red meat, butter and many fried foods. He will also increase vegetables and lean protein in his diet and continue to work on exercise and weight loss efforts. George Montgomery agrees to continue his medications and to start his diet prescription. Merlin agreed to follow up as  directed.  Hypertension We discussed sodium restriction, working on healthy weight loss, and a regular exercise program as the means to achieve improved blood pressure control. We will continue to monitor his blood pressure as well as his progress with the above lifestyle modifications. He will continue his medications as prescribed and to start his diet prescription. He will watch for signs of hypotension as he continues his lifestyle modifications. Kayan agreed with this plan and agreed to follow up as directed in 2 weeks.  Depression Screen Mikael had a moderately positive depression screening. Depression is commonly associated with obesity and often results in emotional eating behaviors. We will monitor this closely and work on CBT to help improve the non-hunger eating patterns. Referral to Psychology may be required if no improvement is seen as he continues in our clinic.  Obesity Vannak is currently in the action stage of change and his goal is to continue with weight loss efforts. He has agreed to follow the Category 3 plan modified to add 1 [place] sausage patty at breakfast and gave him Category 4 microwave meals. Fannie has been instructed to work up to a goal of 150 minutes of combined cardio and strengthening exercise per week for weight loss and overall health benefits. We discussed the following Behavioral Modification  Strategies today: increasing lean protein intake, decreasing simple carbohydrates, and work on meal planning and easy cooking plans. Desmon agrees to discontinue phentermine and topiramate.  Walt has agreed to follow up with our clinic in 2 weeks. He was informed of the importance of frequent follow up visits to maximize his success with intensive lifestyle modifications for his multiple health conditions. He was informed we would discuss his lab results at his next visit unless there is a critical issue that needs to be addressed sooner. Jaesean agreed to keep his next visit at the  agreed upon time to discuss these results.  ALLERGIES: Allergies  Allergen Reactions  . Bee Venom Anaphylaxis  . Lisinopril Other (See Comments)    George Montgomery intolerant of drug which caused his tongue to tingle.  . Celebrex [Celecoxib]     MEDICATIONS: Current Outpatient Medications on File Prior to Visit  Medication Sig Dispense Refill  . albuterol (PROVENTIL HFA;VENTOLIN HFA) 108 (90 Base) MCG/ACT inhaler Use 2 inhalations 15 - 20 minutes apart every 4 hours as needed to Rescue Asthma 3 Inhaler 3  . bisoprolol (ZEBETA) 10 MG tablet Take 1/2 to 1 tablet daily for BP 90 tablet 1  . fenofibrate micronized (LOFIBRA) 134 MG capsule TAKE 1 CAPSULE BY MOUTH ONCE DAILY BEFORE BREAKFAST FOR  BLOOD  FATS 90 capsule 1  . gabapentin (NEURONTIN) 300 MG capsule Take 1 capsule (300 mg total) by mouth 3 (three) times daily as needed. For pain. 90 capsule 1  . meloxicam (MOBIC) 15 MG tablet TAKE 1/2 TO 1 TABLET BY MOUTH WITH FOOD FOR PAIN AND INFLAMMATION 90 tablet 3  . omeprazole (PRILOSEC) 20 MG capsule TAKE 1 CAPSULE BY MOUTH ONCE DAILY 90 capsule 3  . phentermine (ADIPEX-P) 37.5 MG tablet Take 1 tablet every morning for Dieting & Weight Loss 30 tablet 2  . sertraline (ZOLOFT) 50 MG tablet TAKE 1 TABLET BY MOUTH ONCE DAILY 90 tablet 1  . sildenafil (REVATIO) 20 MG tablet Take 1 to 5 tabs daily as needed for XXXX 90 tablet 1  . topiramate (TOPAMAX) 50 MG tablet Take 1/2 to 1  tablet 2 x / day at Suppertime & Bedtime for Dieting & Weight Loss 180 tablet 1  . valsartan-hydrochlorothiazide (DIOVAN HCT) 80-12.5 MG tablet Take 1 tablet by mouth daily. 90 tablet 3   No current facility-administered medications on file prior to visit.     PAST MEDICAL HISTORY: Past Medical History:  Diagnosis Date  . Arthritis   . Asthma   . Back pain   . Chronic pain of both shoulders   . Depression   . Dyspnea   . Elevated glucose   . Environmental allergies   . GERD (gastroesophageal reflux disease)   . HLD  (hyperlipidemia)   . HTN (hypertension)   . Insomnia   . Obesity   . OSA on CPAP 08/01/2014  . Reflux   . Trigger finger     PAST SURGICAL HISTORY: Past Surgical History:  Procedure Laterality Date  . APPENDECTOMY  07/2012  . HEMORRHOID SURGERY  09/2012  . HERNIA REPAIR      SOCIAL HISTORY: Social History   Tobacco Use  . Smoking status: Former Smoker    Packs/day: 1.00    Years: 40.00    Pack years: 40.00    Last attempt to quit: 12/03/2012    Years since quitting: 6.0  . Smokeless tobacco: Never Used  Substance Use Topics  . Alcohol use: Yes    Alcohol/week: 2.0  standard drinks    Types: 2 Standard drinks or equivalent per week    Comment: occ  . Drug use: No    FAMILY HISTORY: Family History  Problem Relation Age of Onset  . Heart disease Mother   . Macular degeneration Mother   . Hypertension Mother   . Hyperlipidemia Mother   . Depression Mother   . Anxiety disorder Mother   . Cancer - Colon Other   . Cancer - Prostate Other   . Liver disease Father   . Alcoholism Father   . Stroke Maternal Aunt   . Cancer - Colon Paternal Uncle   . Cancer - Prostate Paternal Uncle     ROS: Review of Systems  Constitutional: Positive for malaise/fatigue. Negative for weight loss.  HENT: Positive for tinnitus.        Positive for dry mouth.  Eyes:       Wears glasses or contacts.  Respiratory: Positive for shortness of breath and wheezing.   Cardiovascular: Negative for chest pain and orthopnea.  Gastrointestinal: Positive for heartburn.  Genitourinary:       Negative for polyuria.  Neurological:       Negative for lightheadedness.  Endo/Heme/Allergies: Negative for polydipsia.       Positive for polyphagia.  Psychiatric/Behavioral: Positive for depression. The George Montgomery has insomnia.     PHYSICAL EXAM: Blood pressure 119/70, pulse (!) 52, height 5\' 9"  (1.753 m), weight 247 lb (112 kg), SpO2 98 %. Body mass index is 36.48 kg/m. Physical Exam Vitals signs  reviewed.  Constitutional:      Appearance: Normal appearance. He is obese.  HENT:     Head: Normocephalic and atraumatic.     Nose: Nose normal.  Eyes:     General: No scleral icterus.    Extraocular Movements: Extraocular movements intact.  Neck:     Musculoskeletal: Normal range of motion and neck supple.     Thyroid: No thyromegaly.     Comments: Negative for thyromegaly. Cardiovascular:     Rate and Rhythm: Normal rate and regular rhythm.  Pulmonary:     Effort: Pulmonary effort is normal. No respiratory distress.  Abdominal:     Palpations: Abdomen is soft.     Tenderness: There is no abdominal tenderness.     Comments: Positive for obesity.  Musculoskeletal:     Comments: ROM normal in all extremities.  Skin:    General: Skin is warm and dry.  Neurological:     Mental Status: He is alert and oriented to person, place, and time.     Coordination: Coordination normal.  Psychiatric:        Mood and Affect: Mood normal.        Behavior: Behavior normal.     RECENT LABS AND TESTS: BMET    Component Value Date/Time   NA 140 12/22/2018 1254   K 4.3 12/22/2018 1254   CL 101 12/22/2018 1254   CO2 23 12/22/2018 1254   GLUCOSE 104 (H) 12/22/2018 1254   GLUCOSE 116 (H) 12/05/2018 1151   BUN 22 12/22/2018 1254   CREATININE 0.96 12/22/2018 1254   CREATININE 1.00 12/05/2018 1151   CALCIUM 9.6 12/22/2018 1254   GFRNONAA 84 12/22/2018 1254   GFRNONAA 80 12/05/2018 1151   GFRAA 97 12/22/2018 1254   GFRAA 92 12/05/2018 1151   Lab Results  Component Value Date   HGBA1C 5.9 (H) 12/15/2018   No results found for: INSULIN CBC    Component Value Date/Time  WBC 6.2 12/05/2018 1151   RBC 4.45 12/05/2018 1151   HGB 14.1 12/05/2018 1151   HCT 40.3 12/05/2018 1151   PLT 212 12/05/2018 1151   MCV 90.6 12/05/2018 1151   MCH 31.7 12/05/2018 1151   MCHC 35.0 12/05/2018 1151   RDW 12.6 12/05/2018 1151   LYMPHSABS 1,649 12/05/2018 1151   MONOABS 710 02/22/2017 1159    EOSABS 112 12/05/2018 1151   BASOSABS 31 12/05/2018 1151   Iron/TIBC/Ferritin/ %Sat    Component Value Date/Time   IRON 118 02/22/2017 1159   TIBC 335 02/22/2017 1159   IRONPCTSAT 35 02/22/2017 1159   Lipid Panel     Component Value Date/Time   CHOL 160 12/15/2018 1655   TRIG 207 (H) 12/15/2018 1655   HDL 39 (L) 12/15/2018 1655   CHOLHDL 4.1 12/15/2018 1655   VLDL 35 (H) 02/22/2017 1159   LDLCALC 91 12/15/2018 1655   Hepatic Function Panel     Component Value Date/Time   PROT 7.2 12/22/2018 1254   ALBUMIN 4.7 12/22/2018 1254   AST 19 12/22/2018 1254   ALT 32 12/22/2018 1254   ALKPHOS 71 12/22/2018 1254   BILITOT 0.6 12/22/2018 1254   BILIDIR 0.1 10/29/2017 1042   IBILI 0.5 10/29/2017 1042      Component Value Date/Time   TSH 1.380 12/22/2018 1254   Results for DELANTE, KARAPETYAN (MRN [id number]) as of 12/26/2018 09:04  Ref. Range 12/15/2018 16:55  Vitamin D, 25-Hydroxy Latest Ref Range: 30 - 100 ng/mL 24 (L)    ECG  shows NSR with a rate of 48 BPM. INDIRECT CALORIMETER done today shows a VO2 of 329 and a REE of 2291. His calculated basal metabolic rate is 2119 thus his basal metabolic rate is better than expected.  OBESITY BEHAVIORAL INTERVENTION VISIT  Today's visit was # 1  Starting weight: 247 lbs Starting date: 12/22/2018 Today's weight : Weight: 247 lb (112 kg)  Today's date: 12/22/2018 Total lbs lost to date: 0    12/22/2018  Height 5\' 9"  (1.753 m)  Weight 247 lb (112 kg)  BMI (Calculated) 36.46  BLOOD PRESSURE - SYSTOLIC 417  BLOOD PRESSURE - DIASTOLIC 70  Waist Measurement  51 inches   Body Fat % 33.6 %  Total Body Water (lbs) 109.8 lbs  RMR 2291    ASK: We discussed the diagnosis of obesity with [name] today and Mcadoo agreed to give [place] permission to discuss obesity behavioral modification therapy today.  ASSESS: Mclain has the diagnosis of obesity and his BMI today is 36.46. Derril is in the action stage of change.   ADVISE: Harden was  educated on the multiple health risks of obesity as well as the benefit of weight loss to improve his health. He was advised of the need for long term treatment and the importance of lifestyle modifications to improve his current health and to decrease his risk of future health problems.  AGREE: Multiple dietary modification options and treatment options were discussed and Gevork agreed to follow the recommendations documented in the above note.  ARRANGE: Ary was educated on the importance of frequent visits to treat obesity as outlined per CMS and USPSTF guidelines and agreed to schedule his next follow up appointment today.   IMarcille Blanco, CMA, am acting as transcriptionist for Starlyn Skeans, MD   I have reviewed the above documentation for accuracy and completeness, and I agree with the above. -Dennard Nip, MD

## 2018-12-27 NOTE — Telephone Encounter (Signed)
Please give pt vitamin D 50K IU qw #4 and advise chili is ok

## 2018-12-28 ENCOUNTER — Telehealth (INDEPENDENT_AMBULATORY_CARE_PROVIDER_SITE_OTHER): Payer: Self-pay | Admitting: Family Medicine

## 2018-12-28 ENCOUNTER — Other Ambulatory Visit (INDEPENDENT_AMBULATORY_CARE_PROVIDER_SITE_OTHER): Payer: Self-pay

## 2018-12-28 DIAGNOSIS — E559 Vitamin D deficiency, unspecified: Secondary | ICD-10-CM

## 2018-12-28 MED ORDER — VITAMIN D (ERGOCALCIFEROL) 1.25 MG (50000 UNIT) PO CAPS: 50000.0000 [IU] | ORAL_CAPSULE | ORAL | 0 refills | Status: DC

## 2018-12-28 NOTE — Telephone Encounter (Signed)
The patient states that Dr. Leafy Ro was going to call in Vitamin D.  [place] [phone]  Please call pt back on mobile once this is called in to pharmacy.

## 2018-12-31 ENCOUNTER — Other Ambulatory Visit: Payer: Self-pay | Admitting: Physician Assistant

## 2019-01-05 ENCOUNTER — Ambulatory Visit (INDEPENDENT_AMBULATORY_CARE_PROVIDER_SITE_OTHER): Payer: PRIVATE HEALTH INSURANCE | Admitting: Family Medicine

## 2019-01-07 ENCOUNTER — Other Ambulatory Visit: Payer: Self-pay | Admitting: Internal Medicine

## 2019-01-07 MED ORDER — ALPRAZOLAM 0.5 MG PO TABS: ORAL_TABLET | ORAL | 0 refills | Status: DC

## 2019-01-12 ENCOUNTER — Encounter (INDEPENDENT_AMBULATORY_CARE_PROVIDER_SITE_OTHER): Payer: Self-pay

## 2019-01-14 ENCOUNTER — Other Ambulatory Visit: Payer: Self-pay | Admitting: Internal Medicine

## 2019-01-14 DIAGNOSIS — I10 Essential (primary) hypertension: Secondary | ICD-10-CM

## 2019-01-17 ENCOUNTER — Ambulatory Visit (INDEPENDENT_AMBULATORY_CARE_PROVIDER_SITE_OTHER): Payer: PRIVATE HEALTH INSURANCE | Admitting: Family Medicine

## 2019-01-25 ENCOUNTER — Other Ambulatory Visit: Payer: Self-pay

## 2019-01-25 ENCOUNTER — Ambulatory Visit (INDEPENDENT_AMBULATORY_CARE_PROVIDER_SITE_OTHER): Payer: PRIVATE HEALTH INSURANCE | Admitting: Family Medicine

## 2019-01-25 ENCOUNTER — Encounter (INDEPENDENT_AMBULATORY_CARE_PROVIDER_SITE_OTHER): Payer: Self-pay | Admitting: Family Medicine

## 2019-01-25 DIAGNOSIS — E559 Vitamin D deficiency, unspecified: Secondary | ICD-10-CM | POA: Diagnosis not present

## 2019-01-25 DIAGNOSIS — R7303 Prediabetes: Secondary | ICD-10-CM | POA: Diagnosis not present

## 2019-01-25 DIAGNOSIS — Z6836 Body mass index (BMI) 36.0-36.9, adult: Secondary | ICD-10-CM | POA: Diagnosis not present

## 2019-01-25 MED ORDER — METFORMIN HCL 500 MG PO TABS: 500.0000 mg | ORAL_TABLET | Freq: Every day | ORAL | 0 refills | Status: DC

## 2019-01-25 MED ORDER — VITAMIN D (ERGOCALCIFEROL) 1.25 MG (50000 UNIT) PO CAPS: 50000.0000 [IU] | ORAL_CAPSULE | ORAL | 0 refills | Status: DC

## 2019-01-25 NOTE — Progress Notes (Signed)
Office: [phone]  /  Fax: [phone] TeleHealth Visit:  George Montgomery has verbally consented to this TeleHealth visit today. The patient is located at home, the provider is located at the [employer] [facility] office. The participants in this visit include the listed provider, patient, and his wife [name]. The visit was conducted today via Webex.  HPI:   Chief Complaint: OBESITY George Montgomery is here to discuss his progress with his obesity treatment plan. He is on the Category 3 plan and is following his eating plan approximately 90 % of the time. He states he is walking, using the elliptical, and yard work 30 minutes 3 times per week. George Montgomery is currently struggling to follow his plan closely due to grocery shortages and increased afternoon stress eating. He states that he has gotten back on track. He still notes afternoon hunger, but is otherwise doing fine with his plan now.   We were unable to weigh the patient today for this TeleHealth visit. He feels as if he has gained weight since his last visit. He has lost 0 lbs since starting treatment with [place].  Pre-Diabetes George Montgomery has a diagnosis of pre-diabetes based on his elevated Hgb A1c at 5.9, with fasting Insulin at 55.7 on 12/15/18 and a fasting glucose at 104 on 3/5//20 and was informed this puts him at greater risk of developing diabetes. George Montgomery notes afternoon polyphagia even on his diet prescription. He is not taking metformin currently and continues to work on diet and exercise to decrease risk of diabetes. He denies hypoglycemia.   Vitamin D Deficiency George Montgomery has a diagnosis of vitamin D deficiency. He is currently stable on vit D, but is not yet at goal. George Montgomery denies nausea, vomiting, or muscle weakness.  ASSESSMENT AND PLAN:  Vitamin D deficiency - Plan: Vitamin D, Ergocalciferol, (DRISDOL) 1.25 MG (50000 UT) CAPS capsule  Prediabetes - Plan: metFORMIN (GLUCOPHAGE) 500 MG tablet  Class 2 severe obesity with serious comorbidity and  body mass index (BMI) of 36.0 to 36.9 in adult, unspecified obesity type [facility])  PLAN:  Pre-Diabetes George Montgomery will continue to work on weight loss, exercise, and decreasing simple carbohydrates in his diet to help decrease the risk of diabetes. He was informed that eating too many simple carbohydrates or too many calories at one sitting increases the likelihood of GI side effects. Squire agreed to continue his diet, exercise, and to start metformin 500 mg qAM #30 with no refills and a prescription was written today. George Montgomery agreed to follow up with [place] as directed to monitor his progress in 3 weeks.   Vitamin D Deficiency George Montgomery was informed that low vitamin D levels contribute to fatigue and are associated with obesity, breast, and colon cancer. George Montgomery agrees to continue to take prescription Vit D @50 ,000 IU every week #4 with no refills and will follow up for routine testing of vitamin D, at least 2-3 times per year. He was informed of the risk of over-replacement of vitamin D and agrees to not increase his dose unless he discusses this with [place] first. Efe agrees to follow up in 3 weeks as directed.  Obesity George Montgomery is currently in the action stage of change. As such, his goal is to continue with weight loss efforts. He has agreed to follow the Category 3 plan with Category 4 modified with a [place] sausage patty at breakfast and microwave meals. Jhamal has been instructed to work up to a goal of 150 minutes of combined cardio and strengthening exercise per  week for weight loss and overall health benefits. We discussed the following Behavioral Modification Strategies today: work on meal planning and easy cooking plans, keeping healthy foods in the home, and ways to avoid boredom eating.  George Montgomery has agreed to follow up with our clinic in 3 weeks. He was informed of the importance of frequent follow up visits to maximize his success with intensive lifestyle modifications for his multiple health  conditions.  ALLERGIES: Allergies  Allergen Reactions   Bee Venom Anaphylaxis   Lisinopril Other (See Comments)    Patient intolerant of drug which caused his tongue to tingle.   Celebrex [Celecoxib]     MEDICATIONS: Current Outpatient Medications on File Prior to Visit  Medication Sig Dispense Refill   albuterol (PROVENTIL HFA;VENTOLIN HFA) 108 (90 Base) MCG/ACT inhaler Use 2 inhalations 15 - 20 minutes apart every 4 hours as needed to Rescue Asthma 3 Inhaler 3   ALPRAZolam (XANAX) 0.5 MG tablet Take 1/2-1 tablet 2 - 3 x /day ONLY if needed for Anxiety Attack &  limit to 5 days /week to avoid addiction 30 tablet 0   bisoprolol (ZEBETA) 10 MG tablet TAKE 1/2 TO 1 (ONE-HALF TO ONE) TABLET BY MOUTH ONCE DAILY FOR BLOOD PRESSURE 30 tablet 0   fenofibrate micronized (LOFIBRA) 134 MG capsule Take 1 capsule daily for Blood Fats 90 each 3   gabapentin (NEURONTIN) 300 MG capsule Take 1 capsule (300 mg total) by mouth 3 (three) times daily as needed. For pain. 90 capsule 1   meloxicam (MOBIC) 15 MG tablet TAKE 1/2 TO 1 TABLET BY MOUTH WITH FOOD FOR PAIN AND INFLAMMATION 90 tablet 3   omeprazole (PRILOSEC) 20 MG capsule TAKE 1 CAPSULE BY MOUTH ONCE DAILY 90 capsule 3   phentermine (ADIPEX-P) 37.5 MG tablet Take 1 tablet every morning for Dieting & Weight Loss 30 tablet 2   sertraline (ZOLOFT) 50 MG tablet TAKE 1 TABLET BY MOUTH ONCE DAILY 90 tablet 1   sildenafil (REVATIO) 20 MG tablet Take 1 to 5 tabs daily as needed for XXXX 90 tablet 1   topiramate (TOPAMAX) 50 MG tablet Take 1/2 to 1  tablet 2 x / day at Suppertime & Bedtime for Dieting & Weight Loss 180 tablet 1   valsartan-hydrochlorothiazide (DIOVAN HCT) 80-12.5 MG tablet Take 1 tablet by mouth daily. 90 tablet 3   No current facility-administered medications on file prior to visit.     PAST MEDICAL HISTORY: Past Medical History:  Diagnosis Date   Arthritis    Asthma    Back pain    Chronic pain of both shoulders     Depression    Dyspnea    Elevated glucose    Environmental allergies    GERD (gastroesophageal reflux disease)    HLD (hyperlipidemia)    HTN (hypertension)    Insomnia    Obesity    OSA on CPAP 08/01/2014   Reflux    Trigger finger     PAST SURGICAL HISTORY: Past Surgical History:  Procedure Laterality Date   APPENDECTOMY  07/2012   HEMORRHOID SURGERY  09/2012   HERNIA REPAIR      SOCIAL HISTORY: Social History   Tobacco Use   Smoking status: Former Smoker    Packs/day: 1.00    Years: 40.00    Pack years: 40.00    Last attempt to quit: 12/03/2012    Years since quitting: 6.1   Smokeless tobacco: Never Used  Substance Use Topics   Alcohol use: Yes  Alcohol/week: 2.0 standard drinks    Types: 2 Standard drinks or equivalent per week    Comment: occ   Drug use: No    FAMILY HISTORY: Family History  Problem Relation Age of Onset   Heart disease Mother    Macular degeneration Mother    Hypertension Mother    Hyperlipidemia Mother    Depression Mother    Anxiety disorder Mother    Cancer - Colon Other    Cancer - Prostate Other    Liver disease Father    Alcoholism Father    Stroke Maternal Aunt    Cancer - Colon Paternal Uncle    Cancer - Prostate Paternal Uncle     ROS: Review of Systems  Gastrointestinal: Negative for nausea and vomiting.  Musculoskeletal:       Negative for muscle weakness.  Endo/Heme/Allergies:       Negative for hypoglycemia.    PHYSICAL EXAM: Pt in no acute distress  RECENT LABS AND TESTS: BMET    Component Value Date/Time   NA 140 12/22/2018 1254   K 4.3 12/22/2018 1254   CL 101 12/22/2018 1254   CO2 23 12/22/2018 1254   GLUCOSE 104 (H) 12/22/2018 1254   GLUCOSE 116 (H) 12/05/2018 1151   BUN 22 12/22/2018 1254   CREATININE 0.96 12/22/2018 1254   CREATININE 1.00 12/05/2018 1151   CALCIUM 9.6 12/22/2018 1254   GFRNONAA 84 12/22/2018 1254   GFRNONAA 80 12/05/2018 1151   GFRAA 97  12/22/2018 1254   GFRAA 92 12/05/2018 1151   Lab Results  Component Value Date   HGBA1C 5.9 (H) 12/15/2018   HGBA1C 5.4 10/29/2017   HGBA1C 5.3 02/22/2017   HGBA1C 5.5 10/21/2016   HGBA1C 5.7 (H) 12/27/2015   No results found for: INSULIN CBC    Component Value Date/Time   WBC 6.2 12/05/2018 1151   RBC 4.45 12/05/2018 1151   HGB 14.1 12/05/2018 1151   HCT 40.3 12/05/2018 1151   PLT 212 12/05/2018 1151   MCV 90.6 12/05/2018 1151   MCH 31.7 12/05/2018 1151   MCHC 35.0 12/05/2018 1151   RDW 12.6 12/05/2018 1151   LYMPHSABS 1,649 12/05/2018 1151   MONOABS 710 02/22/2017 1159   EOSABS 112 12/05/2018 1151   BASOSABS 31 12/05/2018 1151   Iron/TIBC/Ferritin/ %Sat    Component Value Date/Time   IRON 118 02/22/2017 1159   TIBC 335 02/22/2017 1159   IRONPCTSAT 35 02/22/2017 1159   Lipid Panel     Component Value Date/Time   CHOL 160 12/15/2018 1655   TRIG 207 (H) 12/15/2018 1655   HDL 39 (L) 12/15/2018 1655   CHOLHDL 4.1 12/15/2018 1655   VLDL 35 (H) 02/22/2017 1159   LDLCALC 91 12/15/2018 1655   Hepatic Function Panel     Component Value Date/Time   PROT 7.2 12/22/2018 1254   ALBUMIN 4.7 12/22/2018 1254   AST 19 12/22/2018 1254   ALT 32 12/22/2018 1254   ALKPHOS 71 12/22/2018 1254   BILITOT 0.6 12/22/2018 1254   BILIDIR 0.1 10/29/2017 1042   IBILI 0.5 10/29/2017 1042      Component Value Date/Time   TSH 1.380 12/22/2018 1254   TSH 0.76 12/15/2018 1655   TSH 1.05 09/07/2018 1656   Results for EATHON, VALADE (MRN [id number]) as of 01/25/2019 14:27  Ref. Range 12/15/2018 16:55  Vitamin D, 25-Hydroxy Latest Ref Range: 30 - 100 ng/mL 24 (L)     I, Marcille Blanco, CMA, am acting as Location manager for [employer]  Drucie Opitz, MD I have reviewed the above documentation for accuracy and completeness, and I agree with the above. -Dennard Nip, MD

## 2019-01-31 ENCOUNTER — Ambulatory Visit (INDEPENDENT_AMBULATORY_CARE_PROVIDER_SITE_OTHER): Payer: PRIVATE HEALTH INSURANCE | Admitting: Family Medicine

## 2019-02-11 ENCOUNTER — Other Ambulatory Visit: Payer: Self-pay | Admitting: Adult Health

## 2019-02-11 DIAGNOSIS — I10 Essential (primary) hypertension: Secondary | ICD-10-CM

## 2019-02-15 ENCOUNTER — Ambulatory Visit (INDEPENDENT_AMBULATORY_CARE_PROVIDER_SITE_OTHER): Payer: Self-pay | Admitting: Family Medicine

## 2019-02-16 ENCOUNTER — Ambulatory Visit (INDEPENDENT_AMBULATORY_CARE_PROVIDER_SITE_OTHER): Payer: PRIVATE HEALTH INSURANCE | Admitting: Family Medicine

## 2019-02-16 ENCOUNTER — Other Ambulatory Visit: Payer: Self-pay

## 2019-02-16 DIAGNOSIS — E559 Vitamin D deficiency, unspecified: Secondary | ICD-10-CM

## 2019-02-16 DIAGNOSIS — G4709 Other insomnia: Secondary | ICD-10-CM

## 2019-02-16 DIAGNOSIS — R7303 Prediabetes: Secondary | ICD-10-CM

## 2019-02-16 DIAGNOSIS — Z6836 Body mass index (BMI) 36.0-36.9, adult: Secondary | ICD-10-CM

## 2019-02-16 MED ORDER — METFORMIN HCL 500 MG PO TABS: 500.0000 mg | ORAL_TABLET | Freq: Every day | ORAL | 0 refills | Status: DC

## 2019-02-16 MED ORDER — VITAMIN D (ERGOCALCIFEROL) 1.25 MG (50000 UNIT) PO CAPS: 50000.0000 [IU] | ORAL_CAPSULE | ORAL | 0 refills | Status: DC

## 2019-02-17 ENCOUNTER — Encounter (INDEPENDENT_AMBULATORY_CARE_PROVIDER_SITE_OTHER): Payer: Self-pay | Admitting: Family Medicine

## 2019-02-18 ENCOUNTER — Other Ambulatory Visit (INDEPENDENT_AMBULATORY_CARE_PROVIDER_SITE_OTHER): Payer: Self-pay | Admitting: Family Medicine

## 2019-02-18 DIAGNOSIS — R7303 Prediabetes: Secondary | ICD-10-CM

## 2019-02-20 ENCOUNTER — Other Ambulatory Visit: Payer: Self-pay | Admitting: Internal Medicine

## 2019-02-20 DIAGNOSIS — I10 Essential (primary) hypertension: Secondary | ICD-10-CM

## 2019-02-20 MED ORDER — VALSARTAN-HYDROCHLOROTHIAZIDE 80-12.5 MG PO TABS: 1.0000 | ORAL_TABLET | Freq: Every day | ORAL | 3 refills | Status: DC

## 2019-02-20 NOTE — Telephone Encounter (Signed)
Please advise 

## 2019-02-20 NOTE — Progress Notes (Signed)
Office: [phone]  /  Fax: [phone] TeleHealth Visit:  George Montgomery has verbally consented to this TeleHealth visit today. The patient is located at home, the provider is located at the [employer] [facility] office. The participants in this visit include the listed provider, and [name]. Iliya was unable to use Webex today and the telehealth visit was conducted via telephone.  HPI:   Chief Complaint: OBESITY French is here to discuss his progress with his obesity treatment plan. He is on the Category 3 plan and is following his eating plan approximately 90 % of the time. He states he is using the elliptical 30 minutes 3 times per week. [name] feels that he has maintained his weight. His hunger is controlled, but he is getting a bit tired of his plan and would like to look at other options.  We were unable to weigh the patient today for this TeleHealth visit. He feels as if he has maintained weight since his last visit. He has lost 0 lbs since starting treatment with [place].  Insomnia [name] notes more difficulty falling asleep worse in the last month. He is using his CPAP every night faithfully.  Pre-Diabetes [name] has a diagnosis of pre-diabetes based on his elevated Hgb A1c and was informed this puts him at greater risk of developing diabetes. He is struggling to avoid simple carbs. He is taking metformin and is working on his diet and exercise to decrease risk of diabetes. He denies nausea, vomiting, or hypoglycemia.   Vitamin D Deficiency [name] has a diagnosis of vitamin D deficiency. He is currently stable on vit D. [name] denies nausea, vomiting, or muscle weakness.  ASSESSMENT AND PLAN:  Other insomnia  Prediabetes - Plan: metFORMIN (GLUCOPHAGE) 500 MG tablet  Vitamin D deficiency - Plan: Vitamin D, Ergocalciferol, (DRISDOL) 1.25 MG (50000 UT) CAPS capsule  Class 2 severe obesity with serious comorbidity and body mass index (BMI) of 36.0 to 36.9 in adult, unspecified obesity  type (HCC)  PLAN:  Insomnia The problem of recurrent insomnia was discussed. Avoidance of caffeine sources was strongly encouraged and sleep hygiene strategies were discussed. Silver agrees to follow up in 2 weeks.   Pre-Diabetes [name] will continue to work on weight loss, exercise, and decreasing simple carbohydrates in his diet to help decrease the risk of diabetes. He was informed that eating too many simple carbohydrates or too many calories at one sitting increases the likelihood of GI side effects. [name] agreed to continue metformin 500 mg qAM #30 with no refills and a prescription was written today. [name] agreed to follow up with [place] as directed to monitor his progress in 2 weeks.   Vitamin D Deficiency [name] was informed that low vitamin D levels contribute to fatigue and are associated with obesity, breast, and colon cancer. [name] agrees to continue to take prescription Vit D @50 ,000 IU every week #4 with no refills and will follow up for routine testing of vitamin D, at least 2-3 times per year. He was informed of the risk of over-replacement of vitamin D and agrees to not increase his dose unless he discusses this with [place] first. Aerion agrees to follow up in 2 weeks as directed.  Obesity [name] is currently in the action stage of change. As such, his goal is to continue with weight loss efforts.  He has agreed to change to follow a lower carbohydrate, vegetable, and lean protein rich diet plan. [name] has been instructed to work up to a goal of  150 minutes of combined cardio and strengthening exercise per week for weight loss and overall health benefits. We discussed the following Behavioral Modification Strategies today: increasing vegetables, keeping healthy foods in the home, and work on meal planning and easy cooking plans.  Jonanthony has agreed to follow up with our clinic in 2 weeks. He was informed of the importance of frequent follow up visits to maximize his success with intensive lifestyle  modifications for his multiple health conditions.  ALLERGIES: Allergies  Allergen Reactions   Bee Venom Anaphylaxis   Lisinopril Other (See Comments)    Patient intolerant of drug which caused his tongue to tingle.   Celebrex [Celecoxib]     MEDICATIONS: Current Outpatient Medications on File Prior to Visit  Medication Sig Dispense Refill   albuterol (PROVENTIL HFA;VENTOLIN HFA) 108 (90 Base) MCG/ACT inhaler Use 2 inhalations 15 - 20 minutes apart every 4 hours as needed to Rescue Asthma 3 Inhaler 3   ALPRAZolam (XANAX) 0.5 MG tablet Take 1/2-1 tablet 2 - 3 x /day ONLY if needed for Anxiety Attack &  limit to 5 days /week to avoid addiction 30 tablet 0   bisoprolol (ZEBETA) 10 MG tablet Take 1 tablet Daily for BP 90 tablet 1   fenofibrate micronized (LOFIBRA) 134 MG capsule Take 1 capsule daily for Blood Fats 90 each 3   gabapentin (NEURONTIN) 300 MG capsule Take 1 capsule (300 mg total) by mouth 3 (three) times daily as needed. For pain. 90 capsule 1   meloxicam (MOBIC) 15 MG tablet TAKE 1/2 TO 1 TABLET BY MOUTH WITH FOOD FOR PAIN AND INFLAMMATION 90 tablet 3   omeprazole (PRILOSEC) 20 MG capsule TAKE 1 CAPSULE BY MOUTH ONCE DAILY 90 capsule 3   phentermine (ADIPEX-P) 37.5 MG tablet Take 1 tablet every morning for Dieting & Weight Loss 30 tablet 2   sertraline (ZOLOFT) 50 MG tablet TAKE 1 TABLET BY MOUTH ONCE DAILY 90 tablet 1   sildenafil (REVATIO) 20 MG tablet Take 1 to 5 tabs daily as needed for XXXX 90 tablet 1   topiramate (TOPAMAX) 50 MG tablet Take 1/2 to 1  tablet 2 x / day at Suppertime & Bedtime for Dieting & Weight Loss 180 tablet 1   valsartan-hydrochlorothiazide (DIOVAN HCT) 80-12.5 MG tablet Take 1 tablet by mouth daily. 90 tablet 3   No current facility-administered medications on file prior to visit.     PAST MEDICAL HISTORY: Past Medical History:  Diagnosis Date   Arthritis    Asthma    Back pain    Chronic pain of both shoulders     Depression    Dyspnea    Elevated glucose    Environmental allergies    GERD (gastroesophageal reflux disease)    HLD (hyperlipidemia)    HTN (hypertension)    Insomnia    Obesity    OSA on CPAP 08/01/2014   Reflux    Trigger finger     PAST SURGICAL HISTORY: Past Surgical History:  Procedure Laterality Date   APPENDECTOMY  07/2012   HEMORRHOID SURGERY  09/2012   HERNIA REPAIR      SOCIAL HISTORY: Social History   Tobacco Use   Smoking status: Former Smoker    Packs/day: 1.00    Years: 40.00    Pack years: 40.00    Last attempt to quit: 12/03/2012    Years since quitting: 6.2   Smokeless tobacco: Never Used  Substance Use Topics   Alcohol use: Yes    Alcohol/week:  2.0 standard drinks    Types: 2 Standard drinks or equivalent per week    Comment: occ   Drug use: No    FAMILY HISTORY: Family History  Problem Relation Age of Onset   Heart disease Mother    Macular degeneration Mother    Hypertension Mother    Hyperlipidemia Mother    Depression Mother    Anxiety disorder Mother    Cancer - Colon Other    Cancer - Prostate Other    Liver disease Father    Alcoholism Father    Stroke Maternal Aunt    Cancer - Colon Paternal Uncle    Cancer - Prostate Paternal Uncle     ROS: Review of Systems  Gastrointestinal: Negative for nausea and vomiting.  Musculoskeletal:       Negative for muscle weakness.  Endo/Heme/Allergies:       Negative for hypoglycemia.  Psychiatric/Behavioral: The patient has insomnia.     PHYSICAL EXAM: Pt in no acute distress  RECENT LABS AND TESTS: BMET    Component Value Date/Time   NA 140 12/22/2018 1254   K 4.3 12/22/2018 1254   CL 101 12/22/2018 1254   CO2 23 12/22/2018 1254   GLUCOSE 104 (H) 12/22/2018 1254   GLUCOSE 116 (H) 12/05/2018 1151   BUN 22 12/22/2018 1254   CREATININE 0.96 12/22/2018 1254   CREATININE 1.00 12/05/2018 1151   CALCIUM 9.6 12/22/2018 1254   GFRNONAA 84  12/22/2018 1254   GFRNONAA 80 12/05/2018 1151   GFRAA 97 12/22/2018 1254   GFRAA 92 12/05/2018 1151   Lab Results  Component Value Date   HGBA1C 5.9 (H) 12/15/2018   HGBA1C 5.4 10/29/2017   HGBA1C 5.3 02/22/2017   HGBA1C 5.5 10/21/2016   HGBA1C 5.7 (H) 12/27/2015   No results found for: INSULIN CBC    Component Value Date/Time   WBC 6.2 12/05/2018 1151   RBC 4.45 12/05/2018 1151   HGB 14.1 12/05/2018 1151   HCT 40.3 12/05/2018 1151   PLT 212 12/05/2018 1151   MCV 90.6 12/05/2018 1151   MCH 31.7 12/05/2018 1151   MCHC 35.0 12/05/2018 1151   RDW 12.6 12/05/2018 1151   LYMPHSABS 1,649 12/05/2018 1151   MONOABS 710 02/22/2017 1159   EOSABS 112 12/05/2018 1151   BASOSABS 31 12/05/2018 1151   Iron/TIBC/Ferritin/ %Sat    Component Value Date/Time   IRON 118 02/22/2017 1159   TIBC 335 02/22/2017 1159   IRONPCTSAT 35 02/22/2017 1159   Lipid Panel     Component Value Date/Time   CHOL 160 12/15/2018 1655   TRIG 207 (H) 12/15/2018 1655   HDL 39 (L) 12/15/2018 1655   CHOLHDL 4.1 12/15/2018 1655   VLDL 35 (H) 02/22/2017 1159   LDLCALC 91 12/15/2018 1655   Hepatic Function Panel     Component Value Date/Time   PROT 7.2 12/22/2018 1254   ALBUMIN 4.7 12/22/2018 1254   AST 19 12/22/2018 1254   ALT 32 12/22/2018 1254   ALKPHOS 71 12/22/2018 1254   BILITOT 0.6 12/22/2018 1254   BILIDIR 0.1 10/29/2017 1042   IBILI 0.5 10/29/2017 1042      Component Value Date/Time   TSH 1.380 12/22/2018 1254   TSH 0.76 12/15/2018 1655   TSH 1.05 09/07/2018 1656   Results for HAKOP, HUMBARGER (MRN [id number]) as of 02/20/2019 08:35  Ref. Range 12/15/2018 16:55  Vitamin D, 25-Hydroxy Latest Ref Range: 30 - 100 ng/mL 24 (L)     I, Marcille Blanco, CMA,  am acting as transcriptionist for Starlyn Skeans, MD I have reviewed the above documentation for accuracy and completeness, and I agree with the above. -Dennard Nip, MD

## 2019-03-02 ENCOUNTER — Ambulatory Visit (INDEPENDENT_AMBULATORY_CARE_PROVIDER_SITE_OTHER): Payer: Self-pay | Admitting: Family Medicine

## 2019-03-04 ENCOUNTER — Other Ambulatory Visit: Payer: Self-pay | Admitting: Internal Medicine

## 2019-03-16 ENCOUNTER — Other Ambulatory Visit: Payer: Self-pay | Admitting: Internal Medicine

## 2019-03-16 ENCOUNTER — Ambulatory Visit (INDEPENDENT_AMBULATORY_CARE_PROVIDER_SITE_OTHER): Payer: PRIVATE HEALTH INSURANCE | Admitting: Family Medicine

## 2019-04-06 ENCOUNTER — Other Ambulatory Visit (INDEPENDENT_AMBULATORY_CARE_PROVIDER_SITE_OTHER): Payer: Self-pay | Admitting: Family Medicine

## 2019-04-06 DIAGNOSIS — R7303 Prediabetes: Secondary | ICD-10-CM

## 2019-04-10 ENCOUNTER — Encounter: Payer: Self-pay | Admitting: Internal Medicine

## 2019-04-10 NOTE — Progress Notes (Signed)
History of Present Illness:       This very nice62 y.o.MWM with HTN, HLD, Pre-Diabetes, Testosterone Deficiency("173" in Mar 2017)and Vitamin D Deficiencywho presents with c/o of excessive sleepiness for several days. Alleges still using his CPAP even when having long naps thru-out the last several days. Feels that he has a sinus infection with pressure & congestion about his forehead & eyes and having mucopurulent nasal drainage. Does report some water brash & reflux. He is agreeable to restarting Metformin for weightloss.   Medications  Current Outpatient Medications (Endocrine & Metabolic):  .  metFORMIN (GLUCOPHAGE) 500 MG tablet, Take 1 tablet (500 mg total) by mouth daily with breakfast. .  bisoprolol (ZEBETA) 10 MG tablet, Take 1 tablet Daily for BP .  fenofibrate micronized (LOFIBRA) 134 MG capsule, Take 1 capsule daily for Blood Fats .  valsartan-hydrochlorothiazide (DIOVAN HCT) 80-12.5 MG tablet, Take 1 tablet by mouth daily. Marland Kitchen  albuterol (PROVENTIL HFA;VENTOLIN HFA) 108 (90 Base) MCG/ACT inhaler, Use 2 inhalations 15 - 20 minutes apart every 4 hours as needed to Rescue Asthma .  meloxicam (MOBIC) 15 MG tablet, Take 1/2 to 1 tablet Daily with Food for Pain & Inflammation .  ALPRAZolam (XANAX) 0.5 MG tablet, Take 1/2-1 tablet 2 - 3 x /day ONLY if needed for Anxiety Attack &  limit to 5 days /week to avoid addiction .  gabapentin (NEURONTIN) 300 MG capsule, Take 1 capsule (300 mg total) by mouth 3 (three) times daily as needed. For pain. Marland Kitchen  omeprazole (PRILOSEC) 20 MG capsule, TAKE 1 CAPSULE BY MOUTH ONCE DAILY .  sertraline (ZOLOFT) 50 MG tablet, Take 1 tablet by mouth once daily .  topiramate (TOPAMAX) 50 MG tablet, Take 1/2 to 1  tablet 2 x / day at Suppertime & Bedtime for Dieting & Weight Loss .  Vitamin D, Ergocalciferol, (DRISDOL) 1.25 MG (50000 UT) CAPS capsule, Take 1 capsule (50,000 Units total) by mouth every 7 (seven) days.  Problem list He has Essential hypertension;  Cluster headaches; OSA treated with BiPAP; Vitamin D deficiency; Testosterone deficiency; Morbid obesity (BMI 35.85); Other abnormal glucose; Hyperlipidemia, mixed; Screening for colorectal cancer; Onychomycosis; Dyspnea; Trigger finger, right ring finger; Bilateral hand pain; Depression; Chronic pain of both shoulders; Hand arthritis; Insomnia; and Gastroesophageal reflux disease on their problem list.   Observations/Objective:  BP 132/64   Pulse (!) 52   Temp (!) 97.4 F (36.3 C)   Resp 16   Ht 5\' 9"  (1.753 m)   Wt 253 lb 12.8 oz (115.1 kg)   BMI 37.48 kg/m   HEENT - EAC's & TM's - NL. (+) fronto-maxillary tenderness. N/O/P - clear Neck - supple. No Br, nodes, JVD Chest - Clear equal BS. Cor - Nl HS. RRR w/o sig MGR. PP 1(+). No edema. MS- FROM w/o deformities.  Gait Nl. Neuro -  Nl w/o focal abnormalities.  Assessment and Plan:  1. Essential hypertension  - CBC with Differential/Platelet - COMPLETE METABOLIC PANEL WITH GFR - Magnesium - TSH - Urinalysis, Routine w reflex microscopic  2. Testosterone deficiency  - Testosterone  3. Abnormal glucose  - metFORMIN  500 MG tablet; Take 1 tablet 3 x /day with meals  Disp: 270 tab; Refill: 1  4. Vitamin D deficiency  - CBC with Differential/Platelet - COMPLETE METABOLIC PANEL WITH GFR - Magnesium - TSH - VITAMIN D 25 Hydroxyl - Urinalysis, Routine w reflex microscopic - Iron,Total/Total Iron Binding Cap - Vitamin B12 - Testosterone  5. Subacute pansinusitis  - predniSONE (  DELTASONE) 20 MG tablet; 1 tab 3 x day for 3 days, then 1 tab 2 x day for 3 days, then 1 tab 1 x day for 5 days  Dispense: 20 tablet  - azithromycin (ZITHROMAX) 250 MG tablet; Take 2 tablets (500 mg) on  Day 1,  followed by 1 tablet (250 mg) once daily on Days 2 through 5.  Dispense: 6 each; Refill: 1  6. Gastroesophageal reflux disease  - CBC with Differential/Platelet  7. Fatigue, unspecified type  - CBC with Differential/Platelet -  TSH - VITAMIN D 25 Hydroxyl - Urinalysis, Routine w reflex microscopic - Iron,Total/Total Iron Binding Cap - Vitamin B12  8. Medication management  - CBC with Differential/Platelet - COMPLETE METABOLIC PANEL WITH GFR - Magnesium - TSH - VITAMIN D 25 Hydroxyl - Urinalysis, Routine w reflex microscopic - Iron,Total/Total Iron Binding Cap - Vitamin B12  Follow Up Instructions:   I discussed the assessment and treatment plan with the patient. The patient was provided an opportunity to ask questions and all were answered. The patient agreed with the plan and demonstrated an understanding of the instructions.Between 26 minutes of counseling, chart review, and critical decision making was performed. The patient was advised to call back or seek an in-person evaluation if the symptoms worsen or if the condition fails to improve as anticipated.  Kirtland Bouchard, MD

## 2019-04-11 ENCOUNTER — Encounter: Payer: Self-pay | Admitting: Internal Medicine

## 2019-04-11 ENCOUNTER — Ambulatory Visit: Payer: No Typology Code available for payment source | Admitting: Internal Medicine

## 2019-04-11 ENCOUNTER — Other Ambulatory Visit: Payer: Self-pay

## 2019-04-11 VITALS — BP 132/64 | HR 52 | Temp 97.4°F | Resp 16 | Ht 69.0 in | Wt 253.8 lb

## 2019-04-11 DIAGNOSIS — E559 Vitamin D deficiency, unspecified: Secondary | ICD-10-CM

## 2019-04-11 DIAGNOSIS — I10 Essential (primary) hypertension: Secondary | ICD-10-CM | POA: Diagnosis not present

## 2019-04-11 DIAGNOSIS — R5383 Other fatigue: Secondary | ICD-10-CM | POA: Diagnosis not present

## 2019-04-11 DIAGNOSIS — J0140 Acute pansinusitis, unspecified: Secondary | ICD-10-CM

## 2019-04-11 DIAGNOSIS — R7309 Other abnormal glucose: Secondary | ICD-10-CM | POA: Diagnosis not present

## 2019-04-11 DIAGNOSIS — Z79899 Other long term (current) drug therapy: Secondary | ICD-10-CM

## 2019-04-11 DIAGNOSIS — E349 Endocrine disorder, unspecified: Secondary | ICD-10-CM | POA: Diagnosis not present

## 2019-04-11 DIAGNOSIS — K219 Gastro-esophageal reflux disease without esophagitis: Secondary | ICD-10-CM

## 2019-04-11 MED ORDER — AZITHROMYCIN 250 MG PO TABS: ORAL_TABLET | ORAL | 1 refills | Status: DC

## 2019-04-11 MED ORDER — PREDNISONE 20 MG PO TABS: ORAL_TABLET | ORAL | 0 refills | Status: DC

## 2019-04-11 MED ORDER — METFORMIN HCL 500 MG PO TABS: ORAL_TABLET | ORAL | 1 refills | Status: DC

## 2019-04-11 NOTE — Patient Instructions (Signed)

## 2019-04-12 LAB — CBC WITH DIFFERENTIAL/PLATELET
Absolute Monocytes: 510 cells/uL (ref 200–950)
Basophils Absolute: 30 cells/uL (ref 0–200)
Basophils Relative: 0.5 %
Eosinophils Absolute: 102 cells/uL (ref 15–500)
Eosinophils Relative: 1.7 %
HCT: 42 % (ref 38.5–50.0)
Hemoglobin: 14.8 g/dL (ref 13.2–17.1)
Lymphs Abs: 2028 cells/uL (ref 850–3900)
MCH: 31.6 pg (ref 27.0–33.0)
MCHC: 35.2 g/dL (ref 32.0–36.0)
MCV: 89.7 fL (ref 80.0–100.0)
MPV: 10.7 fL (ref 7.5–12.5)
Monocytes Relative: 8.5 %
Neutro Abs: 3330 cells/uL (ref 1500–7800)
Neutrophils Relative %: 55.5 %
Platelets: 207 10*3/uL (ref 140–400)
RBC: 4.68 10*6/uL (ref 4.20–5.80)
RDW: 12.6 % (ref 11.0–15.0)
Total Lymphocyte: 33.8 %
WBC: 6 10*3/uL (ref 3.8–10.8)

## 2019-04-12 LAB — URINALYSIS, ROUTINE W REFLEX MICROSCOPIC
Bilirubin Urine: NEGATIVE
Glucose, UA: NEGATIVE
Hgb urine dipstick: NEGATIVE
Ketones, ur: NEGATIVE
Leukocytes,Ua: NEGATIVE
Nitrite: NEGATIVE
Protein, ur: NEGATIVE
Specific Gravity, Urine: 1.021 (ref 1.001–1.03)
pH: 7 (ref 5.0–8.0)

## 2019-04-12 LAB — VITAMIN D 25 HYDROXY (VIT D DEFICIENCY, FRACTURES): Vit D, 25-Hydroxy: 40 ng/mL (ref 30–100)

## 2019-04-12 LAB — COMPLETE METABOLIC PANEL WITH GFR
AG Ratio: 1.5 (calc) (ref 1.0–2.5)
ALT: 36 U/L (ref 9–46)
AST: 20 U/L (ref 10–35)
Albumin: 4.6 g/dL (ref 3.6–5.1)
Alkaline phosphatase (APISO): 69 U/L (ref 35–144)
BUN: 18 mg/dL (ref 7–25)
CO2: 29 mmol/L (ref 20–32)
Calcium: 9.8 mg/dL (ref 8.6–10.3)
Chloride: 104 mmol/L (ref 98–110)
Creat: 0.87 mg/dL (ref 0.70–1.25)
GFR, Est African American: 106 mL/min/{1.73_m2} (ref >=60)
GFR, Est Non African American: 91 mL/min/{1.73_m2} (ref >=60)
Globulin: 3 g/dL (calc) (ref 1.9–3.7)
Glucose, Bld: 91 mg/dL (ref 65–99)
Potassium: 4.5 mmol/L (ref 3.5–5.3)
Sodium: 141 mmol/L (ref 135–146)
Total Bilirubin: 0.5 mg/dL (ref 0.2–1.2)
Total Protein: 7.6 g/dL (ref 6.1–8.1)

## 2019-04-12 LAB — TESTOSTERONE: Testosterone: 286 ng/dL (ref 250–827)

## 2019-04-12 LAB — TSH: TSH: 1.61 mIU/L (ref 0.40–4.50)

## 2019-04-12 LAB — IRON, TOTAL/TOTAL IRON BINDING CAP
%SAT: 33 % (calc) (ref 20–48)
Iron: 109 ug/dL (ref 50–180)
TIBC: 333 mcg/dL (calc) (ref 250–425)

## 2019-04-12 LAB — VITAMIN B12: Vitamin B-12: 522 pg/mL (ref 200–1100)

## 2019-04-12 LAB — MAGNESIUM: Magnesium: 2 mg/dL (ref 1.5–2.5)

## 2019-05-02 ENCOUNTER — Ambulatory Visit: Payer: PRIVATE HEALTH INSURANCE | Admitting: Nurse Practitioner

## 2019-05-17 ENCOUNTER — Encounter: Payer: Self-pay | Admitting: Physician Assistant

## 2019-05-17 ENCOUNTER — Other Ambulatory Visit: Payer: Self-pay

## 2019-05-17 ENCOUNTER — Ambulatory Visit: Payer: No Typology Code available for payment source | Admitting: Physician Assistant

## 2019-05-17 VITALS — BP 134/80 | HR 64 | Temp 98.4°F | Wt 257.0 lb

## 2019-05-17 DIAGNOSIS — M6740 Ganglion, unspecified site: Secondary | ICD-10-CM | POA: Diagnosis not present

## 2019-05-17 DIAGNOSIS — H66002 Acute suppurative otitis media without spontaneous rupture of ear drum, left ear: Secondary | ICD-10-CM | POA: Diagnosis not present

## 2019-05-17 DIAGNOSIS — R61 Generalized hyperhidrosis: Secondary | ICD-10-CM | POA: Diagnosis not present

## 2019-05-17 MED ORDER — CIPROFLOXACIN HCL 0.3 % OP SOLN: OPHTHALMIC | 0 refills | Status: DC

## 2019-05-17 MED ORDER — ALUMINUM CHLORIDE 20 % EX SOLN: Freq: Every day | CUTANEOUS | 0 refills | Status: DC

## 2019-05-17 MED ORDER — AMOXICILLIN-POT CLAVULANATE 875-125 MG PO TABS: 1.0000 | ORAL_TABLET | Freq: Two times a day (BID) | ORAL | 0 refills | Status: AC

## 2019-05-17 NOTE — Patient Instructions (Signed)
Stop the wine and see if this helps with weight.   Can do zinc 40-50 mg a day Can try shake that is whey protein, almond milk, avocado oil, and spinach and strawberries in the morning  9 Ways to Naturally Increase Testosterone Levels  1.   Lose Weight If you're overweight, shedding the excess pounds may increase your testosterone levels, according to research presented at the Endocrine Society's 2012 meeting. Overweight men are more likely to have low testosterone levels to begin with, so this is an important trick to increase your body's testosterone production when you need it most.  2.   High-Intensity Exercise like Peak Fitness  Short intense exercise has a proven positive effect on increasing testosterone levels and preventing its decline. That's unlike aerobics or prolonged moderate exercise, which have shown to have negative or no effect on testosterone levels. Having a whey protein meal after exercise can further enhance the satiety/testosterone-boosting impact (hunger hormones cause the opposite effect on your testosterone and libido). Here's a summary of what a typical high-intensity Peak Fitness routine might look like: " Warm up for three minutes  " Exercise as hard and fast as you can for 30 seconds. You should feel like you couldn't possibly go on another few seconds  " Recover at a slow to moderate pace for 90 seconds  " Repeat the high intensity exercise and recovery 7 more times .  3.   Consume Plenty of Zinc The mineral zinc is important for testosterone production, and supplementing your diet for as little as six weeks has been shown to cause a marked improvement in testosterone among men with low levels.1 Likewise, research has shown that restricting dietary sources of zinc leads to a significant decrease in testosterone, while zinc supplementation increases it2 -- and even protects men from exercised-induced reductions in testosterone levels.3 It's estimated that up to 76  percent of adults over the age of 60 may have lower than recommended zinc intakes; even when dietary supplements were added in, an estimated 20-25 percent of older adults still had inadequate zinc intakes, according to a [employer] and Nutrition Examination Survey.4 Your diet is the best source of zinc; along with protein-rich foods like meats and fish, other good dietary sources of zinc include raw milk, raw cheese, beans, and yogurt or kefir made from raw milk. It can be difficult to obtain enough dietary zinc if you're a vegetarian, and also for meat-eaters as well, largely because of conventional farming methods that rely heavily on chemical fertilizers and pesticides. These chemicals deplete the soil of nutrients ... nutrients like zinc that must be absorbed by plants in order to be passed on to you. In many cases, you may further deplete the nutrients in your food by the way you prepare it. For most food, cooking it will drastically reduce its levels of nutrients like zinc ... particularly over-cooking, which many people do. If you decide to use a zinc supplement, stick to a dosage of less than 40 mg a day, as this is the recommended adult upper limit. Taking too much zinc can interfere with your body's ability to absorb other minerals, especially copper, and may cause nausea as a side effect.  4.   Strength Training In addition to Peak Fitness, strength training is also known to boost testosterone levels, provided you are doing so intensely enough. When strength training to boost testosterone, you'll want to increase the weight and lower your number of reps, and then focus on exercises  that work a large number of muscles, such as dead lifts or squats.  You can "turbo-charge" your weight training by going slower. By slowing down your movement, you're actually turning it into a high-intensity exercise. Super Slow movement allows your muscle, at the microscopic level, to access the maximum number of  cross-bridges between the protein filaments that produce movement in the muscle.   5.   Optimize Your Vitamin D Levels Vitamin D, a steroid hormone, is essential for the healthy development of the nucleus of the sperm cell, and helps maintain semen quality and sperm count. Vitamin D also increases levels of testosterone, which may boost libido. In one study, overweight men who were given vitamin D supplements had a significant increase in testosterone levels after one year.5   6.   Reduce Stress When you're under a lot of stress, your body releases high levels of the stress hormone cortisol. This hormone actually blocks the effects of testosterone,6 presumably because, from a biological standpoint, testosterone-associated behaviors (mating, competing, aggression) may have lowered your chances of survival in an emergency (hence, the "fight or flight" response is dominant, courtesy of cortisol).  7.   Limit or Eliminate Sugar from Your Diet Testosterone levels decrease after you eat sugar, which is likely because the sugar leads to a high insulin level, another factor leading to low testosterone.7 Based on USDA estimates, the average American consumes 12 teaspoons of sugar a day, which equates to about TWO TONS of sugar during a lifetime.  8.   Eat Healthy Fats By healthy, this means not only mon- and polyunsaturated fats, like that found in avocadoes and nuts, but also saturated, as these are essential for building testosterone. Research shows that a diet with less than 40 percent of energy as fat (and that mainly from animal sources, i.e. saturated) lead to a decrease in testosterone levels.8 My personal diet is about 60-70 percent healthy fat, and other experts agree that the ideal diet includes somewhere between 50-70 percent fat.  It's important to understand that your body requires saturated fats from animal and vegetable sources (such as meat, dairy, certain oils, and tropical plants like  coconut) for optimal functioning, and if you neglect this important food group in favor of sugar, grains and other starchy carbs, your health and weight are almost guaranteed to suffer. Examples of healthy fats you can eat more of to give your testosterone levels a boost include: Olives and Olive oil  Coconuts and coconut oil Butter made from raw grass-fed organic milk Raw nuts, such as, almonds or pecans Organic pastured egg yolks Avocados Grass-fed meats Palm oil Unheated organic nut oils   9.   Boost Your Intake of Branch Chain Amino Acids (BCAA) from Foods Like [place] suggests that BCAAs result in higher testosterone levels, particularly when taken along with resistance training.9 While BCAAs are available in supplement form, you'll find the highest concentrations of BCAAs like leucine in dairy products - especially quality cheeses and whey protein. Even when getting leucine from your natural food supply, it's often wasted or used as a building block instead of an anabolic agent. So to create the correct anabolic environment, you need to boost leucine consumption way beyond mere maintenance levels. That said, keep in mind that using leucine as a free form amino acid can be highly counterproductive as when free form amino acids are artificially administrated, they rapidly enter your circulation while disrupting insulin function, and impairing your body's glycemic control. Food-based leucine is  really the ideal form that can benefit your muscles without side effects.

## 2019-05-17 NOTE — Progress Notes (Signed)
Subjective:    Patient ID: George Montgomery, male    DOB: 05-13-1956, 63 y.o.   MRN: [id number]  HPI 63 y.o. WM presetns with left ear pain x 3-4 days. [place] and had some popping in his ears then. Has had some dizziness since that time. Was cleaning out his left ear and did have some blood in it. He has had some sinus issues, drainage. He is on allegra at night. No nasal spray.   Has had decreased hearing left ear, feels like he is in a drum, + dizziness. Has chronic tinnitus. No fever chills, cough, SOB, CP.   Has always had excessive sweating under his arms, this is unchanged.   H has [place] sausage, toast, coffee, and 2 eggs. Lunch is sandwich and a soup. Supper chicken breast, stew, asparagus and just water. He feels he is gaining weight, has been doing elliptical twice a day for 6 weeks and has not been losing any weight.  BMI is Body mass index is 37.95 kg/m., he is working on diet and exercise. Wt Readings from Last 3 Encounters:  05/17/19 257 lb (116.6 kg)  04/11/19 253 lb 12.8 oz (115.1 kg)  12/22/18 247 lb (112 kg)   Lab Results  Component Value Date   TSH 1.61 04/11/2019     Blood pressure 134/80, pulse 64, temperature 98.4 F (36.9 C), weight 257 lb (116.6 kg), SpO2 98 %.  Medications Current Outpatient Medications on File Prior to Visit  Medication Sig  . albuterol (PROVENTIL HFA;VENTOLIN HFA) 108 (90 Base) MCG/ACT inhaler Use 2 inhalations 15 - 20 minutes apart every 4 hours as needed to Rescue Asthma  . ALPRAZolam (XANAX) 0.5 MG tablet Take 1/2-1 tablet 2 - 3 x /day ONLY if needed for Anxiety Attack &  limit to 5 days /week to avoid addiction  . bisoprolol (ZEBETA) 10 MG tablet Take 1 tablet Daily for BP  . fenofibrate micronized (LOFIBRA) 134 MG capsule Take 1 capsule daily for Blood Fats  . gabapentin (NEURONTIN) 300 MG capsule Take 1 capsule (300 mg total) by mouth 3 (three) times daily as needed. For pain.  .  meloxicam (MOBIC) 15 MG tablet Take 1/2 to 1 tablet Daily with Food for Pain & Inflammation  . metFORMIN (GLUCOPHAGE) 500 MG tablet Take 1 tablet 3 x /day with meals  . omeprazole (PRILOSEC) 20 MG capsule TAKE 1 CAPSULE BY MOUTH ONCE DAILY  . sertraline (ZOLOFT) 50 MG tablet Take 1 tablet by mouth once daily  . valsartan-hydrochlorothiazide (DIOVAN HCT) 80-12.5 MG tablet Take 1 tablet by mouth daily.   No current facility-administered medications on file prior to visit.     Problem list He has Essential hypertension; Cluster headaches; OSA treated with BiPAP; Vitamin D deficiency; Testosterone deficiency; Morbid obesity (BMI 35.85); Other abnormal glucose; Hyperlipidemia, mixed; Screening for colorectal cancer; Onychomycosis; Dyspnea; Trigger finger, right ring finger; Bilateral hand pain; Depression; Chronic pain of both shoulders; Hand arthritis; Insomnia; and Gastroesophageal reflux disease on their problem list.  Allergies Allergies  Allergen Reactions  . Bee Venom Anaphylaxis  . Lisinopril Other (See Comments)    Patient intolerant of drug which caused his tongue to tingle.  . Celebrex [Celecoxib]     SURGICAL HISTORY He  has a past surgical history that includes Hemorrhoid surgery (09/2012); Appendectomy (07/2012); and Hernia repair. FAMILY HISTORY His family history includes Alcoholism in his father; Anxiety disorder in his mother; Cancer - Colon  in his paternal uncle and another family member; Cancer - Prostate in his paternal uncle and another family member; Depression in his mother; Heart disease in his mother; Hyperlipidemia in his mother; Hypertension in his mother; Liver disease in his father; Macular degeneration in his mother; Stroke in his maternal aunt. SOCIAL HISTORY He  reports that he quit smoking about 6 years ago. He has a 40.00 pack-year smoking history. He has never used smokeless tobacco. He reports current alcohol use of about 2.0 standard drinks of alcohol per  week. He reports that he does not use drugs.   Review of Systems  Constitutional: Positive for diaphoresis. Negative for activity change, appetite change, chills, fatigue, fever and unexpected weight change.  HENT: Positive for ear discharge, ear pain, hearing loss and tinnitus. Negative for congestion, dental problem, drooling, facial swelling, rhinorrhea, sinus pressure, sinus pain, sore throat and trouble swallowing.   Respiratory: Negative.   Cardiovascular: Negative.   Gastrointestinal: Negative.   Musculoskeletal: Positive for arthralgias. Negative for back pain, gait problem, joint swelling, myalgias, neck pain and neck stiffness.       Objective:   Physical Exam Constitutional:      General: He is not in acute distress.    Appearance: He is obese. He is not ill-appearing or toxic-appearing.  HENT:     Right Ear: Hearing, tympanic membrane, ear canal and external ear normal.     Left Ear: Decreased hearing noted. Drainage, swelling and tenderness present. A middle ear effusion is present. There is no impacted cerumen. No foreign body. No mastoid tenderness. No PE tube. No hemotympanum. Tympanic membrane is injected. Tympanic membrane is not scarred, perforated, erythematous, retracted or bulging.     Nose: Nose normal.     Mouth/Throat:     Mouth: Mucous membranes are moist.     Pharynx: Oropharynx is clear.  Eyes:     Extraocular Movements: Extraocular movements intact.     Conjunctiva/sclera: Conjunctivae normal.     Pupils: Pupils are equal, round, and reactive to light.  Cardiovascular:     Rate and Rhythm: Normal rate.     Pulses: Normal pulses.     Heart sounds: Normal heart sounds.  Pulmonary:     Effort: Pulmonary effort is normal.     Breath sounds: Normal breath sounds.  Musculoskeletal:     Comments: Left 2nd finger at DIP with tender nodule.   Skin:    General: Skin is warm.  Neurological:     General: No focal deficit present.        Assessment &  Plan:  Blanton was seen today for acute visit and otalgia.  Diagnoses and all orders for this visit:  Non-recurrent acute suppurative otitis media of left ear without spontaneous rupture of tympanic membrane -     ciprofloxacin (CILOXAN) 0.3 % ophthalmic solution; 2 gtt in eye(s) Q2Hours while awake for 2 days, then 2 gtt in eye(s) Q4Hours for 5 days. -     amoxicillin-clavulanate (AUGMENTIN) 875-125 MG tablet; Take 1 tablet by mouth 2 (two) times daily for 7 days. The patient was advised to call immediately if he has any concerning symptoms in the interval. The patient voices understanding of current treatment options and is in agreement with the current care plan.The patient knows to call the clinic with any problems, questions or concerns or go to the ER if any further progression of symptoms.   Hyperhidrosis -     aluminum chloride (DRYSOL) 20 % external solution; Apply  topically at bedtime. 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

## 2019-05-22 ENCOUNTER — Other Ambulatory Visit: Payer: Self-pay | Admitting: Physician Assistant

## 2019-05-22 MED ORDER — TAMSULOSIN HCL 0.4 MG PO CAPS: 0.4000 mg | ORAL_CAPSULE | Freq: Every day | ORAL | 3 refills | Status: DC

## 2019-06-08 ENCOUNTER — Other Ambulatory Visit: Payer: Self-pay | Admitting: Internal Medicine

## 2019-07-05 ENCOUNTER — Encounter: Payer: Self-pay | Admitting: Internal Medicine

## 2019-07-18 ENCOUNTER — Other Ambulatory Visit: Payer: Self-pay | Admitting: Internal Medicine

## 2019-07-25 ENCOUNTER — Ambulatory Visit (INDEPENDENT_AMBULATORY_CARE_PROVIDER_SITE_OTHER): Payer: No Typology Code available for payment source

## 2019-07-25 ENCOUNTER — Other Ambulatory Visit: Payer: Self-pay

## 2019-07-25 VITALS — Temp 97.5°F

## 2019-07-25 DIAGNOSIS — Z23 Encounter for immunization: Secondary | ICD-10-CM

## 2019-07-25 NOTE — Progress Notes (Signed)
Patient presents to the office for Flu Vaccine. Vaccine administered toLEFTDeltoid withoutanycomplication. Temperature taken and recorded

## 2019-07-26 ENCOUNTER — Other Ambulatory Visit: Payer: Self-pay | Admitting: Internal Medicine

## 2019-07-26 DIAGNOSIS — I10 Essential (primary) hypertension: Secondary | ICD-10-CM

## 2019-07-27 ENCOUNTER — Other Ambulatory Visit: Payer: Self-pay | Admitting: *Deleted

## 2019-07-27 DIAGNOSIS — Z87891 Personal history of nicotine dependence: Secondary | ICD-10-CM

## 2019-07-27 DIAGNOSIS — Z122 Encounter for screening for malignant neoplasm of respiratory organs: Secondary | ICD-10-CM

## 2019-08-09 ENCOUNTER — Ambulatory Visit
Admission: RE | Admit: 2019-08-09 | Discharge: 2019-08-09 | Disposition: A | Discharge status: Home / Self Care | Payer: Self-pay | Source: Ambulatory Visit | Attending: Acute Care | Admitting: Acute Care

## 2019-08-09 ENCOUNTER — Other Ambulatory Visit: Payer: Self-pay

## 2019-08-09 ENCOUNTER — Telehealth: Payer: Self-pay | Admitting: Acute Care

## 2019-08-09 DIAGNOSIS — Z122 Encounter for screening for malignant neoplasm of respiratory organs: Secondary | ICD-10-CM

## 2019-08-09 DIAGNOSIS — Z87891 Personal history of nicotine dependence: Secondary | ICD-10-CM

## 2019-08-09 NOTE — Telephone Encounter (Signed)
Pt informed of CT results per Sarah Groce, NP.  PT verbalized understanding.  Copy sent to PCP.  Order placed for 1 yr f/u CT.  

## 2019-08-10 ENCOUNTER — Telehealth: Payer: Self-pay | Admitting: Acute Care

## 2019-08-21 NOTE — Telephone Encounter (Signed)
Error

## 2019-09-12 ENCOUNTER — Other Ambulatory Visit: Payer: Self-pay

## 2019-09-12 DIAGNOSIS — Z20822 Contact with and (suspected) exposure to covid-19: Secondary | ICD-10-CM

## 2019-09-14 LAB — NOVEL CORONAVIRUS, NAA: SARS-CoV-2, NAA: NOT DETECTED

## 2019-09-16 ENCOUNTER — Other Ambulatory Visit: Payer: Self-pay | Admitting: Physician Assistant

## 2019-10-24 ENCOUNTER — Encounter: Payer: No Typology Code available for payment source | Admitting: Internal Medicine

## 2019-11-04 ENCOUNTER — Other Ambulatory Visit: Payer: Self-pay | Admitting: Internal Medicine

## 2019-11-14 ENCOUNTER — Ambulatory Visit: Payer: PRIVATE HEALTH INSURANCE | Admitting: Neurology

## 2019-11-27 ENCOUNTER — Encounter: Payer: Self-pay | Admitting: Internal Medicine

## 2019-11-27 NOTE — Progress Notes (Signed)
Annual  Screening/Preventative Visit  & Comprehensive Evaluation & Examination     This very nice 64 y.o. MWM presents for a Screening /Preventative Visit & comprehensive evaluation and management of multiple medical co-morbidities.  Patient has been followed for HTN, HLD,  PreDM/Insulin Resistance  and Vitamin D Deficiency.  In 2018, he had evaluation by Dr Elsworth Soho for Dyspnea with Normal Spirometry & Std  ETT and was dx'd with deconditioning. Patient does have OSA and is on CPAP.      HTN predates since 2015. Patient's BP has been controlled at home.  Today's BP is slightly elevated at 152/74. Patient denies any cardiac symptoms as chest pain, palpitations, shortness of breath, dizziness or ankle swelling.     Patient's hyperlipidemia is controlled with diet and medications. Patient denies myalgias or other medication SE's. Last lipids were at goal except elevated Trig's:  Lab Results  Component Value Date   CHOL 160 12/15/2018   HDL 39 (L) 12/15/2018   LDLCALC 91 12/15/2018   TRIG 207 (H) 12/15/2018   CHOLHDL 4.1 12/15/2018       Patient has  Morbid Obesity (BMI 37+) with hx/o PreDM since 2016. He was started on Metformin in April 2020 for Insulin Resistance at the [facility]. Patient denies reactive hypoglycemic symptoms, visual blurring, diabetic polys or paresthesias. Last A1c was at goal:  Lab Results  Component Value Date   HGBA1C 5.9 (H) 12/15/2018    Wt Readings from Last 3 Encounters:  11/28/19 254 lb 9.6 oz (115.5 kg)  05/17/19 257 lb (116.6 kg)  04/11/19 253 lb 12.8 oz (115.1 kg)       Finally, patient has history of Vitamin D Deficiency ("21" / 2016) and last vitamin D was still low (goal = 70-100):  Lab Results  Component Value Date   VD25OH 40 04/11/2019    Current Outpatient Medications on File Prior to Visit  Medication Sig  . albuterol (PROVENTIL HFA;VENTOLIN HFA) 108 (90 Base) MCG/ACT inhaler Use 2 inhalations 15 - 20 minutes apart every 4  hours as needed to Rescue Asthma  . ALPRAZolam (XANAX) 0.5 MG tablet Take 1/2-1 tablet 2 - 3 x /day ONLY if needed for Anxiety Attack &  limit to 5 days /week to avoid addiction  . bisoprolol (ZEBETA) 10 MG tablet Take 1 tablet Daily for BP  . ciprofloxacin (CILOXAN) 0.3 % ophthalmic solution 2 gtt in eye(s) Q2Hours while awake for 2 days, then 2 gtt in eye(s) Q4Hours for 5 days.  . fenofibrate micronized (LOFIBRA) 134 MG capsule Take 1 capsule daily for Blood Fats  . gabapentin (NEURONTIN) 300 MG capsule Take 1 capsule (300 mg total) by mouth 3 (three) times daily as needed. For pain.  . meloxicam (MOBIC) 15 MG tablet Take 1/2 to 1 tablet Daily with Food for Pain & Inflammation  . metFORMIN (GLUCOPHAGE) 500 MG tablet Take 1 tablet 3 x /day with meals  . omeprazole (PRILOSEC) 20 MG capsule Take 1 capsule Daily for Indigestion & Heartburn  . phentermine (ADIPEX-P) 37.5 MG tablet Take 1 tablet every Morning for Dieting & Weight Loss  . sertraline (ZOLOFT) 50 MG tablet Take 1 tablet Daily for Mood  . valsartan-hydrochlorothiazide (DIOVAN HCT) 80-12.5 MG tablet Take 1 tablet by mouth daily.  . tamsulosin (FLOMAX) 0.4 MG CAPS capsule Take 1 capsule (0.4 mg total) by mouth daily after supper. (Patient not taking: Reported on 11/28/2019)   No current facility-administered medications on file prior to visit.   Allergies  Allergen Reactions  . Bee Venom Anaphylaxis  . Lisinopril Other (See Comments)    Patient intolerant of drug which caused his tongue to tingle.  . Celebrex [Celecoxib]    Past Medical History:  Diagnosis Date  . Arthritis   . Asthma   . Back pain   . Chronic pain of both shoulders   . Depression   . Dyspnea   . Elevated glucose   . Environmental allergies   . GERD (gastroesophageal reflux disease)   . HLD (hyperlipidemia)   . HTN (hypertension)   . Insomnia   . Obesity   . OSA on CPAP 08/01/2014  . Reflux   . Trigger finger    Health Maintenance  Topic Date Due  .  Hepatitis C Screening  [birth year]  . HIV Screening  [birth year]/1970  . COLONOSCOPY  02/13/2020  . TETANUS/TDAP  11/06/2026  . INFLUENZA VACCINE  Completed   Immunization History  Administered Date(s) Administered  . Influenza Inj Mdck Quad With Preservative 08/19/2017, 07/25/2019  . Influenza Split 08/06/2015  . Influenza,inj,quad, With Preservative 08/20/2016  . PPD Test 06/03/2018, 11/28/2019  . Pneumococcal Polysaccharide-23 08/20/2016  . Tdap 11/06/2016  . Zoster 11/06/2016   Last Colon - 02/13/2015 - Dr Erlene Senters- Recc 5 year f/u due May 2021  Past Surgical History:  Procedure Laterality Date  . APPENDECTOMY  07/2012  . HEMORRHOID SURGERY  09/2012  . HERNIA REPAIR     Family History  Problem Relation Age of Onset  . Heart disease Mother   . Macular degeneration Mother   . Hypertension Mother   . Hyperlipidemia Mother   . Depression Mother   . Anxiety disorder Mother   . Cancer - Colon Other   . Cancer - Prostate Other   . Liver disease Father   . Alcoholism Father   . Stroke Maternal Aunt   . Cancer - Colon Paternal Uncle   . Cancer - Prostate Paternal Uncle    Social History   Socioeconomic History  . Marital status: Married    Spouse name: [name]  . Number of children: 4  . Years of education: college  Occupational History  . Occupation: Medical illustrator    Comment: [employer]  Tobacco Use  . Smoking status: Former Smoker    Packs/day: 1.00    Years: 40.00    Pack years: 40.00    Quit date: 12/03/2012    Years since quitting: 6.9  . Smokeless tobacco: Never Used  Substance and Sexual Activity  . Alcohol use: Yes    Alcohol/week: 2.0 standard drinks    Types: 2 Standard drinks or equivalent per week    Comment: occ  . Drug use: No  Social History Narrative   Right handed, caffeine 3-4 cups daily, Married, 4 kids, 9 g kids., FT insurance agent (self employed). 4 yrs college    ROS Constitutional: Denies fever, chills, weight loss/gain,  headaches, insomnia,  night sweats or change in appetite. Does c/o fatigue. Eyes: Denies redness, blurred vision, diplopia, discharge, itchy or watery eyes.  ENT: Denies discharge, congestion, post nasal drip, epistaxis, sore throat, earache, hearing loss, dental pain, Tinnitus, Vertigo, Sinus pain or snoring.  Cardio: Denies chest pain, palpitations, irregular heartbeat, syncope, dyspnea, diaphoresis, orthopnea, PND, claudication or edema Respiratory: denies cough, dyspnea, DOE, pleurisy, hoarseness, laryngitis or wheezing.  Gastrointestinal: Denies dysphagia, heartburn, reflux, water brash, pain, cramps, nausea, vomiting, bloating, diarrhea, constipation, hematemesis, melena, hematochezia, jaundice or hemorrhoids Genitourinary: Denies dysuria, frequency, discharge, hematuria or  flank pain. Has urgency, nocturia x 2-3 & occasional hesitancy. Musculoskeletal: Denies arthralgia, myalgia, stiffness, Jt. Swelling, pain, limp or strain/sprain. Denies Falls. Skin: Denies puritis, rash, hives, warts, acne, eczema or change in skin lesion Neuro: No weakness, tremor, incoordination, spasms, paresthesia or pain Psychiatric: Denies confusion, memory loss or sensory loss. Denies Depression. Endocrine: Denies change in weight, skin, hair change, nocturia, and paresthesia, diabetic polys, visual blurring or hyper / hypo glycemic episodes.  Heme/Lymph: No excessive bleeding, bruising or enlarged lymph nodes.  Physical Exam  BP (!) 152/74   Pulse (!) 56   Temp (!) 97.1 F (36.2 C)   Resp 16   Ht 5' 9.5" (1.765 m)   Wt 254 lb 9.6 oz (115.5 kg)   BMI 37.06 kg/m   General Appearance: Over nourished and well groomed and in no apparent distress.  Eyes: PERRLA, EOMs, conjunctiva no swelling or erythema, normal fundi and vessels. Sinuses: No frontal/maxillary tenderness ENT/Mouth: EACs patent / TMs  nl. Nares clear without erythema, swelling, mucoid exudates. Oral hygiene is good. No erythema, swelling, or  exudate. Tongue normal, non-obstructing. Tonsils not swollen or erythematous. Hearing normal.  Neck: Supple, thyroid not palpable. No bruits, nodes or JVD. Respiratory: Respiratory effort normal.  BS equal and clear bilateral without rales, rhonci, wheezing or stridor. Cardio: Heart sounds are normal with regular rate and rhythm and no murmurs, rubs or gallops. Peripheral pulses are normal and equal bilaterally without edema. No aortic or femoral bruits. Chest: symmetric with normal excursions and percussion.  Abdomen: Soft, Rotund with Nl bowel sounds. Nontender, no guarding, rebound, hernias, masses, or organomegaly.  Lymphatics: Non tender without lymphadenopathy.  Musculoskeletal: Full ROM all peripheral extremities, joint stability, 5/5 strength, and normal gait. Skin: Warm and dry without rashes, lesions, cyanosis, clubbing or  ecchymosis.  Neuro: Cranial nerves intact, reflexes equal bilaterally. Normal muscle tone, no cerebellar symptoms. Sensation intact to touch, vibratory and Monofilament to the toes bilaterally. Pysch: Alert and oriented X 3 with normal affect, insight and judgment appropriate.   Assessment and Plan  1. Annual Preventative/Screening Exam   2. Essential hypertension  - EKG 12-Lead - [place], RETROPERITNL ABD,  LTD - Urinalysis, Routine w reflex microscopic - Microalbumin / creatinine urine ratio - CBC with Differential/Platelet - COMPLETE METABOLIC PANEL WITH GFR - Magnesium - TSH  3. Hyperlipidemia, mixed  - EKG 12-Lead - [place], RETROPERITNL ABD,  LTD - Lipid panel - TSH  4. Insulin resistance  - EKG 12-Lead - [place], RETROPERITNL ABD,  LTD - Hemoglobin A1c - Insulin, random  5. Vitamin D deficiency  - VITAMIN D 25 Hydroxy  6. Prediabetes  - [place], RETROPERITNL ABD,  LTD - Hemoglobin A1c - Insulin, random  7. Testosterone deficiency  - Testosterone  8. Class 2 severe obesity due to excess calories with serious comorbidity and body mass index (BMI)  of 38.0 to 38.9 in adult ([place])   9. BPH with obstruction/lower urinary tract symptoms  - PSA  10. Screening for colorectal cancer  - POC Hemoccult Bld/Stl  11. Prostate cancer screening  - PSA  12. Screening for ischemic heart disease  - EKG 12-Lead  13. Atherosclerosis of native coronary artery of native  heart without angina pectoris  - EKG 12-Lead - [place], RETROPERITNL ABD,  LTD  14. FHx: heart disease  - EKG 12-Lead - [place], RETROPERITNL ABD,  LTD  15. Former smoker  - EKG 12-Lead - [place], RETROPERITNL ABD,  LTD  16. Screening for AAA (aortic abdominal aneurysm)  -  [place], RETROPERITNL ABD,  LTD  17. Fatigue, unspecified type  - Iron,Total/Total Iron Binding Cap - Vitamin B12  18. Medication management  - Urinalysis, Routine w reflex microscopic - Microalbumin / creatinine urine ratio - CBC with Differential/Platelet - COMPLETE METABOLIC PANEL WITH GFR - Magnesium - Lipid panel - TSH - Hemoglobin A1c - Insulin, random - VITAMIN D 25 Hydroxy  19. Screening examination for pulmonary tuberculosis  - TB Skin Test         Discussed with patient restarting his Phentermine & Topiramate at 1/2 dose to aid weight loss.  Patient was counseled in prudent diet, weight control to achieve/maintain BMI less than 25, BP monitoring, regular exercise and medications as discussed.  Discussed med effects and SE's. Routine screening labs and tests as requested with regular follow-up as recommended. Over 40 minutes of exam, counseling, chart review and high complex critical decision making was performed   Kirtland Bouchard,  MD  This note is not being shared with the patient for the following reason: To prevent harm (release of this note would result in harm to the life or physical safety of the patient or another).

## 2019-11-27 NOTE — Patient Instructions (Signed)
- Link to sign up at [employer] site for the Covid-19 vaccine   [email or web address].[email or web address]   Or call [phone]   +++++++++++++++++++++++++++++++++++++  Vit D  & Vit C 1,000 mg   are recommended to help protect  against the Covid-19 and other Corona viruses.    Also it's recommended  to take  Zinc 50 mg  to help  protect against the Covid-19   and best place to get  is also on [employer].com  and don't pay more than 6-8 cents /pill !  ================================ Coronavirus (COVID-19) Are you at risk?  Are you at risk for the Coronavirus (COVID-19)?  To be considered HIGH RISK for Coronavirus (COVID-19), you have to meet the following criteria:  . Traveled to [place], [place], [place], [place] or [place]; or in the [place] to [place], [place], [place]  . or [place]; and have fever, cough, and shortness of breath within the last 2 weeks of travel OR . Been in close contact with a person diagnosed with COVID-19 within the last 2 weeks and have  . fever, cough,and shortness of breath .  . IF YOU DO NOT MEET THESE CRITERIA, YOU ARE CONSIDERED LOW RISK FOR COVID-19.  What to do if you are HIGH RISK for COVID-19?  Marland Kitchen If you are having a medical emergency, call 911. . Seek medical care right away. Before you go to a doctor's office, urgent care or emergency department, .  call ahead and tell them about your recent travel, contact with someone diagnosed with COVID-19  .  and your symptoms.  . You should receive instructions from your physician's office regarding next steps of care.  . When you arrive at healthcare provider, tell the healthcare staff immediately you have returned from  . visiting [place], [place], [place], [place] or [place]; or traveled in the [place] to [place], [place],  . [place] or [place] in the last  two weeks or you have been in close contact with a person diagnosed with  . COVID-19 in the last 2 weeks.   . Tell the health care staff about your symptoms: fever, cough and shortness of breath. . After you have been seen by a medical provider, you will be either: o Tested for (COVID-19) and discharged home on quarantine except to seek medical care if  o symptoms worsen, and asked to  - Stay home and avoid contact with others until you get your results (4-5 days)  - Avoid travel on public transportation if possible (such as bus, train, or airplane) or o Sent to the Emergency Department by EMS for evaluation, COVID-19 testing  and  o possible admission depending on your condition and test results.  What to do if you are LOW RISK for COVID-19?  Reduce your risk of any infection by using the same precautions used for avoiding the common cold or flu:  Marland Kitchen Wash your hands often with soap and warm water for at least 20 seconds.  If soap and water are not readily available,  . use an alcohol-based hand sanitizer with at least 60% alcohol.  . If coughing or sneezing, cover your mouth and nose by coughing or sneezing into the elbow areas of your shirt or coat, .  into a tissue or into your sleeve (not your hands). . Avoid shaking hands with others and consider head nods or verbal greetings only. . Avoid touching your eyes, nose, or mouth with unwashed  hands.  . Avoid close contact with people who are sick. . Avoid places or events with large numbers of people in one location, like concerts or sporting events. . Carefully consider travel plans you have or are making. . If you are planning any travel outside or inside the [place], visit the CDC's Travelers' Health webpage for the latest health notices. . If you have some symptoms but not all symptoms, continue to monitor at home and seek medical attention  . if your symptoms worsen. . If you are having a medical emergency, call  911. >>>>>>>>>>>>>>>>>>>>>>>>>>>>>>>>>>>>>>>>>>>>>>>>>>>>>>> We Do NOT Approve of  [facility] [place] Soliciting Our Patients  To Do Home Visits  & We Do NOT Approve of [employer] > > > > > > > > > > > > > > > > > > > > > > > > > > > > > > > > > > >  > > > >   Preventive Care for Adults  A healthy lifestyle and preventive care can promote health and wellness. Preventive health guidelines for men include the following key practices:  A routine yearly physical is a good way to check with your health care provider about your health and preventative screening. It is a chance to share any concerns and updates on your health and to receive a thorough exam.  Visit your dentist for a routine exam and preventative care every 6 months. Brush your teeth twice a day and floss once a day. Good oral hygiene prevents tooth decay and gum disease.  The frequency of eye exams is based on your age, health, family medical history, use of contact lenses, and other factors. Follow your health care provider's recommendations for frequency of eye exams.  Eat a healthy diet. Foods such as vegetables, fruits, whole grains, low-fat dairy products, and lean protein foods contain the nutrients you need without too many calories. Decrease your intake of foods high in solid fats, added sugars, and salt. Eat the right amount of calories for you. Get information about a proper diet from your health care provider, if necessary.  Regular physical exercise is one of the most important things you can do for your health. Most adults should get at least 150 minutes of moderate-intensity exercise (any activity that increases your heart rate and causes you to sweat) each week. In addition, most adults need muscle-strengthening exercises on 2 or more days a week.  Maintain a healthy weight. The body mass index (BMI) is a screening tool to identify possible weight problems. It provides an estimate of body fat  based on height and weight. Your health care provider can find your BMI and can help you achieve or maintain a healthy weight. For adults 20 years and older:  A BMI below 18.5 is considered underweight.  A BMI of 18.5 to 24.9 is normal.  A BMI of 25 to 29.9 is considered overweight.  A BMI of 30 and above is considered obese.  Maintain normal blood lipids and cholesterol levels by exercising and minimizing your intake of saturated fat. Eat a balanced diet with plenty of fruit and vegetables. Blood tests for lipids and cholesterol should begin at age 45 and be repeated every 5 years. If your lipid or cholesterol levels are high, you are over 50, or you are at high risk for heart disease, you may need your cholesterol levels checked more frequently. Ongoing high lipid and cholesterol levels should be treated with medicines if diet and  exercise are not working.  If you smoke, find out from your health care provider how to quit. If you do not use tobacco, do not start.  Lung cancer screening is recommended for adults aged 25-80 years who are at high risk for developing lung cancer because of a history of smoking. A yearly low-dose CT scan of the lungs is recommended for people who have at least a 30-pack-year history of smoking and are a current smoker or have quit within the past 15 years. A pack year of smoking is smoking an average of 1 pack of cigarettes a day for 1 year (for example: 1 pack a day for 30 years or 2 packs a day for 15 years). Yearly screening should continue until the smoker has stopped smoking for at least 15 years. Yearly screening should be stopped for people who develop a health problem that would prevent them from having lung cancer treatment.  If you choose to drink alcohol, do not have more than 2 drinks per day. One drink is considered to be 12 ounces (355 mL) of beer, 5 ounces (148 mL) of wine, or 1.5 ounces (44 mL) of liquor.  Avoid use of street drugs. Do not share  needles with anyone. Ask for help if you need support or instructions about stopping the use of drugs.  High blood pressure causes heart disease and increases the risk of stroke. Your blood pressure should be checked at least every 1-2 years. Ongoing high blood pressure should be treated with medicines, if weight loss and exercise are not effective.  If you are 61-69 years old, ask your health care provider if you should take aspirin to prevent heart disease.  Diabetes screening involves taking a blood sample to check your fasting blood sugar level. Testing should be considered at a younger age or be carried out more frequently if you are overweight and have at least 1 risk factor for diabetes.  Colorectal cancer can be detected and often prevented. Most routine colorectal cancer screening begins at the age of 34 and continues through age 32. However, your health care provider may recommend screening at an earlier age if you have risk factors for colon cancer. On a yearly basis, your health care provider may provide home test kits to check for hidden blood in the stool. Use of a small camera at the end of a tube to directly examine the colon (sigmoidoscopy or colonoscopy) can detect the earliest forms of colorectal cancer. Talk to your health care provider about this at age 68, when routine screening begins. Direct exam of the colon should be repeated every 5-10 years through age 73, unless early forms of precancerous polyps or small growths are found.  Hepatitis C blood testing is recommended for all people born from [birth year] through 1966 and any individual with known risks for hepatitis C.  Screening for abdominal aortic aneurysm (AAA)  by ultrasound is recommended for people who have history of high blood pressure or who are current or former smokers.  Healthy men should  receive prostate-specific antigen (PSA) blood tests as part of routine cancer screening. Talk with your health care provider about  prostate cancer screening.  Testicular cancer screening is  recommended for adult males. Screening includes self-exam, a health care provider exam, and other screening tests. Consult with your health care provider about any symptoms you have or any concerns you have about testicular cancer.  Use sunscreen. Apply sunscreen liberally and repeatedly throughout the day. You should seek  shade when your shadow is shorter than you. Protect yourself by wearing long sleeves, pants, a wide-brimmed hat, and sunglasses year round, whenever you are outdoors.  Once a month, do a whole-body skin exam, using a mirror to look at the skin on your back. Tell your health care provider about new moles, moles that have irregular borders, moles that are larger than a pencil eraser, or moles that have changed in shape or color.  Stay current with required vaccines (immunizations).  Influenza vaccine. All adults should be immunized every year.  Tetanus, diphtheria, and acellular pertussis (Td, Tdap) vaccine. An adult who has not previously received Tdap or who does not know his vaccine status should receive 1 dose of Tdap. This initial dose should be followed by tetanus and diphtheria toxoids (Td) booster doses every 10 years. Adults with an unknown or incomplete history of completing a 3-dose immunization series with Td-containing vaccines should begin or complete a primary immunization series including a Tdap dose. Adults should receive a Td booster every 10 years.  Zoster vaccine. One dose is recommended for adults aged 8 years or older unless certain conditions are present.    PREVNAR - Pneumococcal 13-valent conjugate (PCV13) vaccine. When indicated, a person who is uncertain of his immunization history and has no record of immunization should receive the PCV13 vaccine. An adult aged 94 years or older who has certain medical conditions and has not been previously immunized should receive 1 dose of PCV13 vaccine. This  PCV13 should be followed with a dose of pneumococcal polysaccharide (PPSV23) vaccine. The PPSV23 vaccine dose should be obtained 1 or more year(s)after the dose of PCV13 vaccine. An adult aged 36 years or older who has certain medical conditions and previously received 1 or more doses of PPSV23 vaccine should receive 1 dose of PCV13. The PCV13 vaccine dose should be obtained 1 or more years after the last PPSV23 vaccine dose.    PNEUMOVAX - Pneumococcal polysaccharide (PPSV23) vaccine. When PCV13 is also indicated, PCV13 should be obtained first. All adults aged 56 years and older should be immunized. An adult younger than age 34 years who has certain medical conditions should be immunized. Any person who resides in a nursing home or long-term care facility should be immunized. An adult smoker should be immunized. People with an immunocompromised condition and certain other conditions should receive both PCV13 and PPSV23 vaccines. People with human immunodeficiency virus (HIV) infection should be immunized as soon as possible after diagnosis. Immunization during chemotherapy or radiation therapy should be avoided. Routine use of PPSV23 vaccine is not recommended for American Indians, [place] Natives, or people younger than 65 years unless there are medical conditions that require PPSV23 vaccine. When indicated, people who have unknown immunization and have no record of immunization should receive PPSV23 vaccine. One-time revaccination 5 years after the first dose of PPSV23 is recommended for people aged 19-64 years who have chronic kidney failure, nephrotic syndrome, asplenia, or immunocompromised conditions. People who received 1-2 doses of PPSV23 before age 61 years should receive another dose of PPSV23 vaccine at age 73 years or later if at least 5 years have passed since the previous dose. Doses of PPSV23 are not needed for people immunized with PPSV23 at or after age 58 years.    Hepatitis A vaccine.  Adults who wish to be protected from this disease, have certain high-risk conditions, work with hepatitis A-infected animals, work in hepatitis A research labs, or travel to or work in countries with a  high rate of hepatitis A should be immunized. Adults who were previously unvaccinated and who anticipate close contact with an international adoptee during the first 60 days after arrival in the [place] from a country with a high rate of hepatitis A should be immunized.    Hepatitis B vaccine. Adults should be immunized if they wish to be protected from this disease, have certain high-risk conditions, may be exposed to blood or other infectious body fluids, are household contacts or sex partners of hepatitis B positive people, are clients or workers in certain care facilities, or travel to or work in countries with a high rate of hepatitis B.   Preventive Service / Frequency   Ages 64 and over  Blood pressure check.  Lipid and cholesterol check.  Lung cancer screening. / Every year if you are aged 30-80 years and have a 30-pack-year history of smoking and currently smoke or have quit within the past 15 years. Yearly screening is stopped once you have quit smoking for at least 15 years or develop a health problem that would prevent you from having lung cancer treatment.  Fecal occult blood test (FOBT) of stool. You may not have to do this test if you get a colonoscopy every 10 years.  Flexible sigmoidoscopy** or colonoscopy.** / Every 5 years for a flexible sigmoidoscopy or every 10 years for a colonoscopy beginning at age 34 and continuing until age 16.  Hepatitis C blood test.** / For all people born from [birth year] through 1966 and any individual with known risks for hepatitis C.  Abdominal aortic aneurysm (AAA) screening./ Screening current or former smokers or have Hypertension.  Skin self-exam. / Monthly.  Influenza vaccine. / Every year.  Tetanus, diphtheria, and acellular pertussis  (Tdap/Td) vaccine.** / 1 dose of Td every 10 years.   Zoster vaccine.** / 1 dose for adults aged 54 years or older.         Pneumococcal 13-valent conjugate (PCV13) vaccine.    Pneumococcal polysaccharide (PPSV23) vaccine.     Hepatitis A vaccine.** / Consult your health care provider.  Hepatitis B vaccine.** / Consult your health care provider. Screening for abdominal aortic aneurysm (AAA)  by ultrasound is recommended for people who have history of high blood pressure or who are current or former smokers. ++++++++++ Recommend Adult Low Dose Aspirin or  coated  Aspirin 81 mg daily  To reduce risk of Colon Cancer 40 %,  Skin Cancer 26 % ,  Malignant Melanoma 46%  and  Pancreatic cancer 60% ++++++++++++++++++++++ Vitamin D goal  is between 70-100.  Please make sure that you are taking your Vitamin D as directed.  It is very important as a natural anti-inflammatory  helping hair, skin, and nails, as well as reducing stroke and heart attack risk.  It helps your bones and helps with mood. It also decreases numerous cancer risks so please take it as directed.  Low Vit D is associated with a 200-300% higher risk for CANCER  and 200-300% higher risk for HEART   ATTACK  &  STROKE.   .....................................Marland Kitchen It is also associated with higher death rate at younger ages,  autoimmune diseases like Rheumatoid arthritis, Lupus, Multiple Sclerosis.    Also many other serious conditions, like depression, Alzheimer's Dementia, infertility, muscle aches, fatigue, fibromyalgia - just to name a few. ++++++++++++++++++++++ Recommend the book "The END of DIETING" by Dr Excell Seltzer  & the book "The END of DIABETES " by Dr Excell Seltzer At [email or web address] -  get book & Audio CD's    Being diabetic has a  300% increased risk for heart attack, stroke, cancer, and alzheimer- type vascular dementia. It is very important that you work harder with diet by avoiding all foods that are white. Avoid  white rice (brown & wild rice is OK), white potatoes (sweetpotatoes in moderation is OK), White bread or wheat bread or anything made out of white flour like bagels, donuts, rolls, buns, biscuits, cakes, pastries, cookies, pizza crust, and pasta (made from white flour & egg whites) - vegetarian pasta or spinach or wheat pasta is OK. Multigrain breads like Arnold's or Pepper[employer], or multigrain sandwich thins or flatbreads.  Diet, exercise and weight loss can reverse and cure diabetes in the early stages.  Diet, exercise and weight loss is very important in the control and prevention of complications of diabetes which affects every system in your body, ie. Brain - dementia/stroke, eyes - glaucoma/blindness, heart - heart attack/heart failure, kidneys - dialysis, stomach - gastric paralysis, intestines - malabsorption, nerves - severe painful neuritis, circulation - gangrene & loss of a leg(s), and finally cancer and Alzheimers.    I recommend avoid fried & greasy foods,  sweets/candy, white rice (brown or wild rice or Quinoa is OK), white potatoes (sweet potatoes are OK) - anything made from white flour - bagels, doughnuts, rolls, buns, biscuits,white and wheat breads, pizza crust and traditional pasta made of white flour & egg white(vegetarian pasta or spinach or wheat pasta is OK).  Multi-grain bread is OK - like multi-grain flat bread or sandwich thins. Avoid alcohol in excess. Exercise is also important.    Eat all the vegetables you want - avoid meat, especially red meat and dairy - especially cheese.  Cheese is the most concentrated form of trans-fats which is the worst thing to clog up our arteries. Veggie cheese is OK which can be found in the fresh produce section at [employer] or [employer] or [employer]  ++++++++++++++++++++++ DASH Eating Plan  DASH stands for "Dietary Approaches to Stop Hypertension."   The DASH eating plan is a healthy eating plan that has been shown to reduce high  blood pressure (hypertension). Additional health benefits may include reducing the risk of type 2 diabetes mellitus, heart disease, and stroke. The DASH eating plan may also help with weight loss. WHAT DO I NEED TO KNOW ABOUT THE DASH EATING PLAN? For the DASH eating plan, you will follow these general guidelines:  Choose foods with a percent daily value for sodium of less than 5% (as listed on the food label).  Use salt-free seasonings or herbs instead of table salt or sea salt.  Check with your health care provider or pharmacist before using salt substitutes.  Eat lower-sodium products, often labeled as "lower sodium" or "no salt added."  Eat fresh foods.  Eat more vegetables, fruits, and low-fat dairy products.  Choose whole grains. Look for the word "whole" as the first word in the ingredient list.  Choose fish   Limit sweets, desserts, sugars, and sugary drinks.  Choose heart-healthy fats.  Eat veggie cheese   Eat more home-cooked food and less restaurant, buffet, and fast food.  Limit fried foods.  Cook foods using methods other than frying.  Limit canned vegetables. If you do use them, rinse them well to decrease the sodium.  When eating at a restaurant, ask that your food be prepared with less salt, or no salt if possible.  WHAT FOODS CAN I EAT? Read Dr Fara Olden Fuhrman's books on The End of Dieting & The End of Diabetes  Grains Whole grain or whole wheat bread. Brown rice. Whole grain or whole wheat pasta. Quinoa, bulgur, and whole grain cereals. Low-sodium cereals. Corn or whole wheat flour tortillas. Whole grain cornbread. Whole grain crackers. Low-sodium crackers.  Vegetables Fresh or frozen vegetables (raw, steamed, roasted, or grilled). Low-sodium or reduced-sodium tomato and vegetable juices. Low-sodium or reduced-sodium tomato sauce and paste. Low-sodium or reduced-sodium canned vegetables.   Fruits All fresh, canned (in natural juice), or  frozen fruits.  Protein Products  All fish and seafood.  Dried beans, peas, or lentils. Unsalted nuts and seeds. Unsalted canned beans.  Dairy Low-fat dairy products, such as skim or 1% milk, 2% or reduced-fat cheeses, low-fat ricotta or cottage cheese, or plain low-fat yogurt. Low-sodium or reduced-sodium cheeses.  Fats and Oils Tub margarines without trans fats. Light or reduced-fat mayonnaise and salad dressings (reduced sodium). Avocado. Safflower, olive, or canola oils. Natural peanut or almond butter.  Other Unsalted popcorn and pretzels. The items listed above may not be a complete list of recommended foods or beverages. Contact your dietitian for more options.  ++++++++++++++++++++  WHAT FOODS ARE NOT RECOMMENDED? Grains/ White flour or wheat flour White bread. White pasta. White rice. Refined cornbread. Bagels and croissants. Crackers that contain trans fat.  Vegetables  Creamed or fried vegetables. Vegetables in a . Regular canned vegetables. Regular canned tomato sauce and paste. Regular tomato and vegetable juices.  Fruits Dried fruits. Canned fruit in light or heavy syrup. Fruit juice.  Meat and Other Protein Products Meat in general - RED meat & White meat.  Fatty cuts of meat. Ribs, chicken wings, all processed meats as bacon, sausage, bologna, salami, fatback, hot dogs, bratwurst and packaged luncheon meats.  Dairy Whole or 2% milk, cream, half-and-half, and cream cheese. Whole-fat or sweetened yogurt. Full-fat cheeses or blue cheese. Non-dairy creamers and whipped toppings. Processed cheese, cheese spreads, or cheese curds.  Condiments Onion and garlic salt, seasoned salt, table salt, and sea salt. Canned and packaged gravies. Worcestershire sauce. Tartar sauce. Barbecue sauce. Teriyaki sauce. Soy sauce, including reduced sodium. Steak sauce. Fish sauce. Oyster sauce. Cocktail sauce. Horseradish. Ketchup and mustard. Meat flavorings and tenderizers. Bouillon cubes.  Hot sauce. Tabasco sauce. Marinades. Taco seasonings. Relishes.  Fats and Oils Butter, stick margarine, lard, shortening and bacon fat. Coconut, palm kernel, or palm oils. Regular salad dressings.  Pickles and olives. Salted popcorn and pretzels.  The items listed above may not be a complete list of foods and beverages to avoid.

## 2019-11-28 ENCOUNTER — Ambulatory Visit: Payer: No Typology Code available for payment source | Admitting: Internal Medicine

## 2019-11-28 ENCOUNTER — Other Ambulatory Visit: Payer: Self-pay

## 2019-11-28 VITALS — BP 152/74 | HR 56 | Temp 97.1°F | Resp 16 | Ht 69.5 in | Wt 254.6 lb

## 2019-11-28 DIAGNOSIS — Z1389 Encounter for screening for other disorder: Secondary | ICD-10-CM

## 2019-11-28 DIAGNOSIS — N401 Enlarged prostate with lower urinary tract symptoms: Secondary | ICD-10-CM | POA: Diagnosis not present

## 2019-11-28 DIAGNOSIS — R350 Frequency of micturition: Secondary | ICD-10-CM

## 2019-11-28 DIAGNOSIS — E349 Endocrine disorder, unspecified: Secondary | ICD-10-CM

## 2019-11-28 DIAGNOSIS — Z8249 Family history of ischemic heart disease and other diseases of the circulatory system: Secondary | ICD-10-CM | POA: Diagnosis not present

## 2019-11-28 DIAGNOSIS — Z130 Encounter for screening for diseases of the blood and blood-forming organs and certain disorders involving the immune mechanism: Secondary | ICD-10-CM | POA: Diagnosis not present

## 2019-11-28 DIAGNOSIS — R5383 Other fatigue: Secondary | ICD-10-CM

## 2019-11-28 DIAGNOSIS — E559 Vitamin D deficiency, unspecified: Secondary | ICD-10-CM

## 2019-11-28 DIAGNOSIS — Z1329 Encounter for screening for other suspected endocrine disorder: Secondary | ICD-10-CM | POA: Diagnosis not present

## 2019-11-28 DIAGNOSIS — I2510 Atherosclerotic heart disease of native coronary artery without angina pectoris: Secondary | ICD-10-CM

## 2019-11-28 DIAGNOSIS — Z125 Encounter for screening for malignant neoplasm of prostate: Secondary | ICD-10-CM

## 2019-11-28 DIAGNOSIS — N138 Other obstructive and reflux uropathy: Secondary | ICD-10-CM

## 2019-11-28 DIAGNOSIS — Z111 Encounter for screening for respiratory tuberculosis: Secondary | ICD-10-CM

## 2019-11-28 DIAGNOSIS — E88819 Insulin resistance, unspecified: Secondary | ICD-10-CM

## 2019-11-28 DIAGNOSIS — E8881 Metabolic syndrome: Secondary | ICD-10-CM

## 2019-11-28 DIAGNOSIS — R7303 Prediabetes: Secondary | ICD-10-CM

## 2019-11-28 DIAGNOSIS — Z0000 Encounter for general adult medical examination without abnormal findings: Secondary | ICD-10-CM | POA: Diagnosis not present

## 2019-11-28 DIAGNOSIS — Z79899 Other long term (current) drug therapy: Secondary | ICD-10-CM | POA: Diagnosis not present

## 2019-11-28 DIAGNOSIS — Z131 Encounter for screening for diabetes mellitus: Secondary | ICD-10-CM | POA: Diagnosis not present

## 2019-11-28 DIAGNOSIS — Z0001 Encounter for general adult medical examination with abnormal findings: Secondary | ICD-10-CM

## 2019-11-28 DIAGNOSIS — Z87891 Personal history of nicotine dependence: Secondary | ICD-10-CM | POA: Diagnosis not present

## 2019-11-28 DIAGNOSIS — Z13220 Encounter for screening for lipoid disorders: Secondary | ICD-10-CM | POA: Diagnosis not present

## 2019-11-28 DIAGNOSIS — I10 Essential (primary) hypertension: Secondary | ICD-10-CM

## 2019-11-28 DIAGNOSIS — Z136 Encounter for screening for cardiovascular disorders: Secondary | ICD-10-CM

## 2019-11-28 DIAGNOSIS — E782 Mixed hyperlipidemia: Secondary | ICD-10-CM

## 2019-11-28 DIAGNOSIS — Z1211 Encounter for screening for malignant neoplasm of colon: Secondary | ICD-10-CM

## 2019-11-29 LAB — COMPLETE METABOLIC PANEL WITH GFR
AG Ratio: 1.7 (calc) (ref 1.0–2.5)
ALT: 35 U/L (ref 9–46)
AST: 20 U/L (ref 10–35)
Albumin: 4.3 g/dL (ref 3.6–5.1)
Alkaline phosphatase (APISO): 63 U/L (ref 35–144)
BUN: 23 mg/dL (ref 7–25)
CO2: 26 mmol/L (ref 20–32)
Calcium: 8.7 mg/dL (ref 8.6–10.3)
Chloride: 106 mmol/L (ref 98–110)
Creat: 0.94 mg/dL (ref 0.70–1.25)
GFR, Est African American: 99 mL/min/{1.73_m2} (ref >=60)
GFR, Est Non African American: 85 mL/min/{1.73_m2} (ref >=60)
Globulin: 2.5 g/dL (calc) (ref 1.9–3.7)
Glucose, Bld: 154 mg/dL — ABNORMAL HIGH (ref 65–99)
Potassium: 4 mmol/L (ref 3.5–5.3)
Sodium: 140 mmol/L (ref 135–146)
Total Bilirubin: 0.6 mg/dL (ref 0.2–1.2)
Total Protein: 6.8 g/dL (ref 6.1–8.1)

## 2019-11-29 LAB — VITAMIN D 25 HYDROXY (VIT D DEFICIENCY, FRACTURES): Vit D, 25-Hydroxy: 88 ng/mL (ref 30–100)

## 2019-11-29 LAB — LIPID PANEL
Cholesterol: 147 mg/dL (ref <200)
HDL: 31 mg/dL — ABNORMAL LOW (ref >=40)
LDL Cholesterol (Calc): 84 mg/dL (calc)
Non-HDL Cholesterol (Calc): 116 mg/dL (calc) (ref <130)
Total CHOL/HDL Ratio: 4.7 (calc) (ref <5.0)
Triglycerides: 227 mg/dL — ABNORMAL HIGH (ref <150)

## 2019-11-29 LAB — HEMOGLOBIN A1C
Hgb A1c MFr Bld: 6 % of total Hgb — ABNORMAL HIGH (ref <5.7)
Mean Plasma Glucose: 126 (calc)
eAG (mmol/L): 7 (calc)

## 2019-11-29 LAB — CBC WITH DIFFERENTIAL/PLATELET
Absolute Monocytes: 398 cells/uL (ref 200–950)
Basophils Absolute: 50 cells/uL (ref 0–200)
Basophils Relative: 0.9 %
Eosinophils Absolute: 101 cells/uL (ref 15–500)
Eosinophils Relative: 1.8 %
HCT: 41.1 % (ref 38.5–50.0)
Hemoglobin: 14 g/dL (ref 13.2–17.1)
Lymphs Abs: 1680 cells/uL (ref 850–3900)
MCH: 31 pg (ref 27.0–33.0)
MCHC: 34.1 g/dL (ref 32.0–36.0)
MCV: 90.9 fL (ref 80.0–100.0)
MPV: 10 fL (ref 7.5–12.5)
Monocytes Relative: 7.1 %
Neutro Abs: 3371 cells/uL (ref 1500–7800)
Neutrophils Relative %: 60.2 %
Platelets: 208 10*3/uL (ref 140–400)
RBC: 4.52 10*6/uL (ref 4.20–5.80)
RDW: 12.4 % (ref 11.0–15.0)
Total Lymphocyte: 30 %
WBC: 5.6 10*3/uL (ref 3.8–10.8)

## 2019-11-29 LAB — URINALYSIS, ROUTINE W REFLEX MICROSCOPIC
Bilirubin Urine: NEGATIVE
Glucose, UA: NEGATIVE
Hgb urine dipstick: NEGATIVE
Ketones, ur: NEGATIVE
Leukocytes,Ua: NEGATIVE
Nitrite: NEGATIVE
Protein, ur: NEGATIVE
Specific Gravity, Urine: 1.023 (ref 1.001–1.03)
pH: 6.5 (ref 5.0–8.0)

## 2019-11-29 LAB — IRON, TOTAL/TOTAL IRON BINDING CAP
%SAT: 35 % (calc) (ref 20–48)
Iron: 107 ug/dL (ref 50–180)
TIBC: 309 mcg/dL (calc) (ref 250–425)

## 2019-11-29 LAB — MICROALBUMIN / CREATININE URINE RATIO
Creatinine, Urine: 109 mg/dL (ref 20–320)
Microalb, Ur: <0.2 mg/dL

## 2019-11-29 LAB — VITAMIN B12: Vitamin B-12: 461 pg/mL (ref 200–1100)

## 2019-11-29 LAB — TESTOSTERONE: Testosterone: 295 ng/dL (ref 250–827)

## 2019-11-29 LAB — PSA: PSA: 0.8 ng/mL (ref <=4.0)

## 2019-11-29 LAB — TSH: TSH: 1.36 mIU/L (ref 0.40–4.50)

## 2019-11-29 LAB — MAGNESIUM: Magnesium: 2 mg/dL (ref 1.5–2.5)

## 2019-11-29 LAB — INSULIN, RANDOM: Insulin: 71.1 u[IU]/mL — ABNORMAL HIGH

## 2019-12-18 ENCOUNTER — Encounter: Payer: Self-pay | Admitting: Neurology

## 2020-01-16 ENCOUNTER — Other Ambulatory Visit: Payer: Self-pay | Admitting: Internal Medicine

## 2020-01-18 ENCOUNTER — Telehealth: Payer: Self-pay

## 2020-01-18 NOTE — Telephone Encounter (Signed)
I called pt to remind him to bring his bipap chip to his appt on Monday. No answer, left a message asking him to call me back. If pt calls back, please remind him of this.

## 2020-01-22 ENCOUNTER — Encounter: Payer: Self-pay | Admitting: Neurology

## 2020-01-22 ENCOUNTER — Ambulatory Visit: Payer: Medicare HMO | Admitting: Neurology

## 2020-01-22 ENCOUNTER — Other Ambulatory Visit: Payer: Self-pay

## 2020-01-22 VITALS — BP 135/61 | HR 57 | Ht 69.5 in | Wt 248.0 lb

## 2020-01-22 DIAGNOSIS — G4733 Obstructive sleep apnea (adult) (pediatric): Secondary | ICD-10-CM | POA: Diagnosis not present

## 2020-01-22 NOTE — Patient Instructions (Signed)
We will do a home sleep test for your convenience to re-evaluate your sleep apnea and to qualify for new equipment.  You will take testing equipment home for this and bring it back the next day for analysis.    I will likely see you back after your sleep study to go over the test results and where to go from there. We will call you after your sleep study to advise about the results (you will hear from my nurse) and to set up an appointment at the time.    Our sleep lab administrative assistant will call you to schedule your sleep equipment pick up so you or your family member can learn how to put the sensors on at night. If you don't hear back from her by next week please feel free to call her at [phone]. This is her direct line and please leave a message with your phone number to call back if you get the voicemail box. She will call back as soon as possible.

## 2020-01-22 NOTE — Progress Notes (Signed)
Subjective:    Patient ID: George Montgomery is a 64 y.o. male.  HPI     Interim history:  [name] is a 64 year old right-handed gentleman with an underlying medical  history of cervical and lumbar degenerative spine disease, anxiety, depression (not currently on medication d/t side), asthma, hypertension, obesity, and OSA, who presents for follow-up consultation of his OSA, for his yearly checkup. The patient is unaccompanied today. I last saw him on 11/08/2018, at which time he was fully compliant with his BiPAP machine.  He wanted to pursue getting a new machine through Medicare when he turns 34.  He had significant joint pain and had trouble losing weight.  I made a referral to weight management clinic.  He also had additional stressors what with his elderly mother's medical issues and his wife's joint replacement surgery pending.  Today, 01/22/2020: I reviewed his BiPAP compliance data from 11/19/2019 through 12/18/2019, which is a total of 30 days, during which time he used his machine every night with percent use days greater than 4 hours at 100%, indicating superb compliance, average usage of 9 hours and 5 minutes, residual AHI at goal at 1.8/h, pressure of 17/13, leak acceptable.  Data from after December 18, 2019 is not available.  He reports that he would like to have a new machine.  For some reason, we could not get the data from after 12/18/2019.  He is compliant with treatment.  He uses nasal pillows and has been getting his supplies online.  He has not had a sleep study in many years.  He is working on weight loss.  He would be willing to proceed with a home sleep test to reevaluate his sleep apnea diagnosis and getting new equipment, he is eligible for new machine.  He has received his first Covid vaccine dose, is due this week for his second dose, did well after the first 1.    The patient's allergies, current medications, family history, past medical history, past social history, past surgical  history and problem list were reviewed and updated as appropriate.    Previously (copied from previous notes for reference):    I saw him on 09/22/2017, at which time he was fully compliant with his BiPAP and reported some sleep disruption from his joint pain and nocturia.   I reviewed his BiPAP compliance data from 10/09/2018 through 11/07/2018 which is a total of 30 days, during which time he used his BiPAP every night with percent used days greater than 4 hours at 100%, indicating superb compliance. Average usage of 9 hours and 21 minutes, residual AHI at goal at 1.4 per hour, leak in the acceptable range, pressure at 17/13 cm.      I saw him on 07/20/2016, at which time he reported doing quite well, no further headaches, had some joint pain, requested to switch in his DME provider to [facility] He was compliant with his BiPAP of 17/13 cm.   I reviewed his BiPAP compliance data from 08/23/2017 through 09/21/2014 which is a total of 30 days, during which time he used his machine every night with percent used days greater than 4 hours at 100%, indicating superb compliance with an average usage of 8 hours and 22 minutes, residual AHI at goal at 1.9 per hour, leak acceptable, pressure of 17/13 cm.    I saw him on 07/18/2015, at which time he reported feeling better. Headaches had improved. He was compliant with BiPAP. He was on verapamil. Unfortunately, he had  taken a fall on his boat and landed on both knees, was suffering from right knee pain since then and was supposed to see her orthopedic doctor. He was taking trazodone as needed, 150 mg strength, a quarter of a pill as needed.   I saw him on 08/27/2014 and he canceled an appointment for December 2015. He was advised to follow-up in one month at the time. I suggested sumatriptan 100 mg strength for abortive treatment of cluster headaches at the time and starting him on verapamil for headache prevention. I asked him to be compliant with BiPAP therapy. I  asked him to undergo a brain MRI. He did not pursue this as his headaches improved after he started the verapamil and he started feeling better.   I reviewed his BiPAP compliance data from 06/18/2015 through 07/17/2015 which is a total of 30 days during which time he was 100% compliant. Average usage was 8 hours and 57 minutes. He estimates that he sleeps about 6-7 hours on an average night. Sometimes he has to get up to use the bathroom. Average AHI was 1.5 per hour, pressure of 17/13, leak low.   The patient reports new onset HA since 08/02/14, started abruptly. No triggers noted. He recently had the flu shot. He also stopped his testosterone replacement about a month ago. He does endorse more stress, work related. He has also recently increased his caffeine intake. He has had a daily headache which lasts up to 2 hours. It typically starts in the evening. It is right-sided behind his eye and retro-orbital also in the temporal area and radiates to the back. It is severe. He does get puffiness around the right eye and occasional runny nose on the right side. He does not have any double vision or blurry vision. Glasses have not been checked in over 18 months. He does not have nausea but does have to lie down. Hydrocodone has not helped it only 1 pill and he has to take 2. He has also taken high-dose Motrin, 600 mg, and has tried a muscle relaxant, he has to lie down. He has never had anything like this before. He does not have a history of migraines before. He does not note any neurological symptoms and in between he feels at baseline. He has been reluctant to do anything besides the minimal for fear of exacerbating these headaches. They have been daily since 08/02/2014. He was prescribed Frovatriptan on 08/21/14 by Dr. Bebe Shaggy, but his insurance did not cover that. He has tried sumatriptan and 50 mg which he felt has helped after about 15-20 minutes.he has found relief with taking a warm bath. He has also tried  heat pad on his head, which provided some relief.   The patient recalls being diagnosed with OSA some 12 years ago, severe, per patient. Over the years, he has had about 3 sleep studies, last some 3 years ago. He was originally placed on CPAP and most recently at the last sleep study he remembers that he was switched to BiPAP. He brought his BiPAP machine in for review and we were able to get a download: This was from 05/01/2014 through 07/31/2014 during which time he used his BiPAP machine every night without any skipped days and his percent used days greater than 4 hours was 100% indicating superb compliance, average usage was 8 hours and 7 minutes approximately. Residual AHI was low at 0.7 per hour and leak was very low at 1 minute and 58 seconds as far  as large leak per day dose. His pressure is 17/13 cm.     His typical bedtime is between 10:30 and 11 PM and usually falls asleep quickly. Weight time is between 6 and 6:30 AM. He estimates that he gets about 7 hours of sleep. He denies any morning headaches or severe daytime somnolence. He does have an ESS of 11/24 today. He denies significant restless leg symptoms. His first sleep study may have been about 12 years ago. He gained quite a bit of weight after he quit smoking about 2 years ago. He gets his DME care through [place] [facility].      His Past Medical History Is Significant For: Past Medical History:  Diagnosis Date  . Arthritis   . Asthma   . Back pain   . Chronic pain of both shoulders   . Depression   . Dyspnea   . Elevated glucose   . Environmental allergies   . GERD (gastroesophageal reflux disease)   . HLD (hyperlipidemia)   . HTN (hypertension)   . Insomnia   . Obesity   . OSA on CPAP 08/01/2014  . Reflux   . Trigger finger     His Past Surgical History Is Significant For: Past Surgical History:  Procedure Laterality Date  . APPENDECTOMY  07/2012  . HEMORRHOID SURGERY  09/2012  . HERNIA REPAIR      His  Family History Is Significant For: Family History  Problem Relation Age of Onset  . Heart disease Mother   . Macular degeneration Mother   . Hypertension Mother   . Hyperlipidemia Mother   . Depression Mother   . Anxiety disorder Mother   . Cancer - Colon Other   . Cancer - Prostate Other   . Liver disease Father   . Alcoholism Father   . Stroke Maternal Aunt   . Cancer - Colon Paternal Uncle   . Cancer - Prostate Paternal Uncle     His Social History Is Significant For: Social History   Socioeconomic History  . Marital status: Married    Spouse name: [name]  . Number of children: 4  . Years of education: college  . Highest education level: Not on file  Occupational History  . Occupation: Medical illustrator    Comment: [employer]  Tobacco Use  . Smoking status: Former Smoker    Packs/day: 1.00    Years: 40.00    Pack years: 40.00    Quit date: 12/03/2012    Years since quitting: 7.1  . Smokeless tobacco: Never Used  Substance and Sexual Activity  . Alcohol use: Yes    Alcohol/week: 2.0 standard drinks    Types: 2 Standard drinks or equivalent per week    Comment: occ  . Drug use: No  . Sexual activity: Not on file  Other Topics Concern  . Not on file  Social History Narrative   Right handed, caffeine 3-4 cups daily, Married, 4 kids, 9 g kids., FT insurance agent (self employed). 4 yrs college   Social Determinants of Health   Financial Resource Strain:   . Difficulty of Paying Living Expenses:   Food Insecurity:   . Worried About Charity fundraiser in the Last Year:   . Arboriculturist in the Last Year:   Transportation Needs:   . Film/video editor (Medical):   Marland Kitchen Lack of Transportation (Non-Medical):   Physical Activity:   . Days of Exercise per Week:   . Minutes  of Exercise per Session:   Stress:   . Feeling of Stress :   Social Connections:   . Frequency of Communication with Friends and Family:   . Frequency of Social Gatherings  with Friends and Family:   . Attends Religious Services:   . Active Member of Clubs or Organizations:   . Attends Archivist Meetings:   Marland Kitchen Marital Status:     His Allergies Are:  Allergies  Allergen Reactions  . Bee Venom Anaphylaxis  . Lisinopril Other (See Comments)    Patient intolerant of drug which caused his tongue to tingle.  . Celebrex [Celecoxib]   :   His Current Medications Are:  Outpatient Encounter Medications as of 01/22/2020  Medication Sig  . albuterol (PROVENTIL HFA;VENTOLIN HFA) 108 (90 Base) MCG/ACT inhaler Use 2 inhalations 15 - 20 minutes apart every 4 hours as needed to Rescue Asthma  . ALPRAZolam (XANAX) 0.5 MG tablet Take 1/2-1 tablet 2 - 3 x /day ONLY if needed for Anxiety Attack &  limit to 5 days /week to avoid addiction  . bisoprolol (ZEBETA) 10 MG tablet Take 1 tablet Daily for BP  . ciprofloxacin (CILOXAN) 0.3 % ophthalmic solution 2 gtt in eye(s) Q2Hours while awake for 2 days, then 2 gtt in eye(s) Q4Hours for 5 days.  . fenofibrate micronized (LOFIBRA) 134 MG capsule Take 1 capsule daily for Blood Fats  . gabapentin (NEURONTIN) 300 MG capsule Take 1 capsule (300 mg total) by mouth 3 (three) times daily as needed. For pain.  . meloxicam (MOBIC) 15 MG tablet Take 1/2 to 1 tablet Daily with Food for Pain & Inflammation  . montelukast (SINGULAIR) 10 MG tablet Take 1 tablet Daily for Allergies  . omeprazole (PRILOSEC) 20 MG capsule Take 1 capsule Daily for Indigestion & Heartburn  . phentermine (ADIPEX-P) 37.5 MG tablet Take 1 tablet every Morning for Dieting & Weight Loss  . sertraline (ZOLOFT) 50 MG tablet Take 1 tablet Daily for Mood  . tamsulosin (FLOMAX) 0.4 MG CAPS capsule Take 1 capsule (0.4 mg total) by mouth daily after supper.  . valsartan-hydrochlorothiazide (DIOVAN HCT) 80-12.5 MG tablet Take 1 tablet by mouth daily. (Patient taking differently: Take 0.5 tablets by mouth daily. )  . [DISCONTINUED] metFORMIN (GLUCOPHAGE) 500 MG tablet Take  1 tablet 3 x /day with meals   No facility-administered encounter medications on file as of 01/22/2020.  :  Review of Systems:  Out of a complete 14 point review of systems, all are reviewed and negative with the exception of these symptoms as listed below: Review of Systems  Neurological:       Pt presents today to discuss his bipap. Pt reports that he would like a new bipap since his is about 64 years old. He would be agreeable to a new sleep study. Pt would like to use [facility].    Objective:  Neurological Exam  Physical Exam Physical Examination:   Vitals:   01/22/20 1141  BP: 135/61  Pulse: (!) 57    General Examination: The patient is a very pleasant 64 y.o. male in no acute distress. He appears well-developed and well-nourished and well groomed.   HEENT:Normocephalic, atraumatic, pupils are equal, round and reactive to light.Corrective eye glasses in place.Normal smooth pursuit is noted. Hearing is grossly intact. Face is symmetric with normal facial animation and normal facial sensation. Speech is clear with no dysarthria noted. There is no hypophonia. There is no lip, neck/head, jaw or voice tremor. Neck shows FROM.  Oropharynx exam reveals: mild mouth dryness, adequate dental hygiene and moderate airway crowding. Mallampati is class II. Tongue protrudes centrally and palate elevates symmetrically. Tonsils are absent.   Chest:Clear to auscultation without wheezing, rhonchi or crackles noted.  Heart:S1+S2+0, regular and normal without murmurs, rubs or gallops noted.   Abdomen:Soft, non-tender and non-distended.  Extremities:There is no pitting edema in the distal lower extremities bilaterally.  Skin: Warm and dry without trophic changes noted.   Musculoskeletal: exam reveals no obv. Change.   Neurologically:  Mental status: The patient is awake, alert and oriented in all 4 spheres. His immediate and remote memory, attention, language skills and fund of knowledge  are appropriate. There is no evidence of aphasia, agnosia, apraxia or anomia. Speech is clear with normal prosody and enunciation. Thought process is linear. Mood is normal and affect is normal.  Cranial nerves II - XII are as described above under HEENT exam. Motor exam: Normal bulk, strength and tone is noted. There is no tremor. Fine motor skills and coordination: intactgrossly. Cerebellar testing: No dysmetria or intention tremor. There is no truncal or gait ataxia.  Sensory exam: intact to light touch in the upper and lower extremities.  Gait, station and balance: He stands easily. No veering to one side is noted. No leaning to one side is noted. Posture is age-appropriate and stance is narrow based. Gait shows normal stride length and normal pace. No problems turning are noted.   Assessment and Plan:   In summary, George Montgomery is a very pleasant 64 year old male with an underlying medical history of cervical and lumbar degenerative spine disease, anxiety, depression, asthma, hypertension, history of cluster headaches, and obesity who presents for follow-up consultation of his obstructive sleep apnea, well-established on BiPAP therapy. He continues to be fully compliant and is commended for this. He should be eligible for a new machine.I suggested we proceed with a home sleep test for reevaluation.  He is currently on a standard BiPAP of 17/13 centimeters.  He would be willing to try AutoPap therapy if the need arises.  He is advised to continue to work on weight loss.  We will get in touch with him soon to set up his home sleep test and after his sleep study we will be in touch by phone call regarding the test results and new therapy equipment for home use.  I answered all his questions today and he was in agreement.  I spent 20 minutes in total face-to-face time and in reviewing records during pre-charting, more than 50% of which was spent in counseling and coordination of care, reviewing  test results, reviewing medications and treatment regimen and/or in discussing or reviewing the diagnosis of OSA, the prognosis and treatment options. Pertinent laboratory and imaging test results that were available during this visit with the patient were reviewed by me and considered in my medical decision making (see chart for details).

## 2020-01-24 DIAGNOSIS — Z85828 Personal history of other malignant neoplasm of skin: Secondary | ICD-10-CM | POA: Diagnosis not present

## 2020-01-24 DIAGNOSIS — D0462 Carcinoma in situ of skin of left upper limb, including shoulder: Secondary | ICD-10-CM | POA: Diagnosis not present

## 2020-01-24 DIAGNOSIS — L821 Other seborrheic keratosis: Secondary | ICD-10-CM | POA: Diagnosis not present

## 2020-01-24 DIAGNOSIS — L814 Other melanin hyperpigmentation: Secondary | ICD-10-CM | POA: Diagnosis not present

## 2020-01-24 DIAGNOSIS — D225 Melanocytic nevi of trunk: Secondary | ICD-10-CM | POA: Diagnosis not present

## 2020-01-24 DIAGNOSIS — L918 Other hypertrophic disorders of the skin: Secondary | ICD-10-CM | POA: Diagnosis not present

## 2020-01-24 DIAGNOSIS — Z85820 Personal history of malignant melanoma of skin: Secondary | ICD-10-CM | POA: Diagnosis not present

## 2020-01-24 DIAGNOSIS — D2272 Melanocytic nevi of left lower limb, including hip: Secondary | ICD-10-CM | POA: Diagnosis not present

## 2020-01-24 DIAGNOSIS — L570 Actinic keratosis: Secondary | ICD-10-CM | POA: Diagnosis not present

## 2020-01-24 DIAGNOSIS — L718 Other rosacea: Secondary | ICD-10-CM | POA: Diagnosis not present

## 2020-01-24 DIAGNOSIS — D1801 Hemangioma of skin and subcutaneous tissue: Secondary | ICD-10-CM | POA: Diagnosis not present

## 2020-01-25 ENCOUNTER — Ambulatory Visit: Payer: PRIVATE HEALTH INSURANCE | Admitting: Neurology

## 2020-01-27 ENCOUNTER — Other Ambulatory Visit: Payer: Self-pay | Admitting: Internal Medicine

## 2020-02-02 DIAGNOSIS — M65331 Trigger finger, right middle finger: Secondary | ICD-10-CM | POA: Diagnosis not present

## 2020-02-02 DIAGNOSIS — M79641 Pain in right hand: Secondary | ICD-10-CM | POA: Diagnosis not present

## 2020-02-02 DIAGNOSIS — M71349 Other bursal cyst, unspecified hand: Secondary | ICD-10-CM | POA: Diagnosis not present

## 2020-02-02 DIAGNOSIS — M79642 Pain in left hand: Secondary | ICD-10-CM | POA: Diagnosis not present

## 2020-02-12 ENCOUNTER — Ambulatory Visit (INDEPENDENT_AMBULATORY_CARE_PROVIDER_SITE_OTHER): Payer: Medicare HMO | Admitting: Neurology

## 2020-02-12 DIAGNOSIS — G4733 Obstructive sleep apnea (adult) (pediatric): Secondary | ICD-10-CM

## 2020-02-14 ENCOUNTER — Encounter: Payer: Self-pay | Admitting: Adult Health

## 2020-02-14 ENCOUNTER — Ambulatory Visit (INDEPENDENT_AMBULATORY_CARE_PROVIDER_SITE_OTHER): Payer: Medicare HMO | Admitting: Adult Health

## 2020-02-14 ENCOUNTER — Other Ambulatory Visit: Payer: Self-pay

## 2020-02-14 VITALS — BP 130/70 | HR 55 | Temp 97.0°F | Ht 69.5 in | Wt 248.0 lb

## 2020-02-14 DIAGNOSIS — R5383 Other fatigue: Secondary | ICD-10-CM | POA: Diagnosis not present

## 2020-02-14 DIAGNOSIS — M545 Low back pain, unspecified: Secondary | ICD-10-CM

## 2020-02-14 DIAGNOSIS — N3943 Post-void dribbling: Secondary | ICD-10-CM | POA: Diagnosis not present

## 2020-02-14 DIAGNOSIS — K760 Fatty (change of) liver, not elsewhere classified: Secondary | ICD-10-CM

## 2020-02-14 DIAGNOSIS — N138 Other obstructive and reflux uropathy: Secondary | ICD-10-CM | POA: Insufficient documentation

## 2020-02-14 DIAGNOSIS — N401 Enlarged prostate with lower urinary tract symptoms: Secondary | ICD-10-CM

## 2020-02-14 MED ORDER — PREDNISONE 20 MG PO TABS: ORAL_TABLET | ORAL | 0 refills | Status: DC

## 2020-02-14 MED ORDER — FINASTERIDE 5 MG PO TABS: 5.0000 mg | ORAL_TABLET | Freq: Every day | ORAL | 1 refills | Status: DC

## 2020-02-14 NOTE — Patient Instructions (Addendum)
Consider sublingual B12 supplement  Get back on vitamin D - suggest 5000 IU   Try back exercises, stretches, avoid rowing or other exercises that might stress back, try heating pad and massage  Follow up if pain isn't improving in 3-4 weeks or with any new or different symptoms    Back Exercises These exercises help to make your trunk and back strong. They also help to keep the lower back flexible. Doing these exercises can help to prevent back pain or lessen existing pain.  If you have back pain, try to do these exercises 2-3 times each day or as told by your doctor.  As you get better, do the exercises once each day. Repeat the exercises more often as told by your doctor.  To stop back pain from coming back, do the exercises once each day, or as told by your doctor. Exercises Single knee to chest Do these steps 3-5 times in a row for each leg: 1. Lie on your back on a firm bed or the floor with your legs stretched out. 2. Bring one knee to your chest. 3. Grab your knee or thigh with both hands and hold them it in place. 4. Pull on your knee until you feel a gentle stretch in your lower back or buttocks. 5. Keep doing the stretch for 10-30 seconds. 6. Slowly let go of your leg and straighten it. Pelvic tilt Do these steps 5-10 times in a row: 1. Lie on your back on a firm bed or the floor with your legs stretched out. 2. Bend your knees so they point up to the ceiling. Your feet should be flat on the floor. 3. Tighten your lower belly (abdomen) muscles to press your lower back against the floor. This will make your tailbone point up to the ceiling instead of pointing down to your feet or the floor. 4. Stay in this position for 5-10 seconds while you gently tighten your muscles and breathe evenly. Cat-cow Do these steps until your lower back bends more easily: 1. Get on your hands and knees on a firm surface. Keep your hands under your shoulders, and keep your knees under your  hips. You may put padding under your knees. 2. Let your head hang down toward your chest. Tighten (contract) the muscles in your belly. Point your tailbone toward the floor so your lower back becomes rounded like the back of a cat. 3. Stay in this position for 5 seconds. 4. Slowly lift your head. Let the muscles of your belly relax. Point your tailbone up toward the ceiling so your back forms a sagging arch like the back of a cow. 5. Stay in this position for 5 seconds.  Press-ups Do these steps 5-10 times in a row: 1. Lie on your belly (face-down) on the floor. 2. Place your hands near your head, about shoulder-width apart. 3. While you keep your back relaxed and keep your hips on the floor, slowly straighten your arms to raise the top half of your body and lift your shoulders. Do not use your back muscles. You may change where you place your hands in order to make yourself more comfortable. 4. Stay in this position for 5 seconds. 5. Slowly return to lying flat on the floor.  Bridges Do these steps 10 times in a row: 1. Lie on your back on a firm surface. 2. Bend your knees so they point up to the ceiling. Your feet should be flat on the floor. Your arms  should be flat at your sides, next to your body. 3. Tighten your butt muscles and lift your butt off the floor until your waist is almost as high as your knees. If you do not feel the muscles working in your butt and the back of your thighs, slide your feet 1-2 inches farther away from your butt. 4. Stay in this position for 3-5 seconds. 5. Slowly lower your butt to the floor, and let your butt muscles relax. If this exercise is too easy, try doing it with your arms crossed over your chest. Belly crunches Do these steps 5-10 times in a row: 1. Lie on your back on a firm bed or the floor with your legs stretched out. 2. Bend your knees so they point up to the ceiling. Your feet should be flat on the floor. 3. Cross your arms over your  chest. 4. Tip your chin a little bit toward your chest but do not bend your neck. 5. Tighten your belly muscles and slowly raise your chest just enough to lift your shoulder blades a tiny bit off of the floor. Avoid raising your body higher than that, because it can put too much stress on your low back. 6. Slowly lower your chest and your head to the floor. Back lifts Do these steps 5-10 times in a row: 1. Lie on your belly (face-down) with your arms at your sides, and rest your forehead on the floor. 2. Tighten the muscles in your legs and your butt. 3. Slowly lift your chest off of the floor while you keep your hips on the floor. Keep the back of your head in line with the curve in your back. Look at the floor while you do this. 4. Stay in this position for 3-5 seconds. 5. Slowly lower your chest and your face to the floor. Contact a doctor if:  Your back pain gets a lot worse when you do an exercise.  Your back pain does not get better 2 hours after you exercise. If you have any of these problems, stop doing the exercises. Do not do them again unless your doctor says it is okay. Get help right away if:  You have sudden, very bad back pain. If this happens, stop doing the exercises. Do not do them again unless your doctor says it is okay. This information is not intended to replace advice given to you by your health care provider. Make sure you discuss any questions you have with your health care provider. Document Revised: 06/30/2018 Document Reviewed: 06/30/2018 Elsevier Patient Education  2020 [employer].

## 2020-02-14 NOTE — Progress Notes (Signed)
Assessment and Plan:  George Montgomery was seen today for flank pain, excessive sweating and fatigue.  Diagnoses and all orders for this visit:  Acute left-sided low back pain without sciatica - negative straight leg Prednisone was prescribed,NSAIDs, RICE, and exercise given If not better follow up in office or will refer to PT/orthopedics. Natural history and expected course discussed. Questions answered. Neurosurgeon distributed. Proper lifting, bending technique discussed. Stretching exercises discussed. Short (2-4 day) period of relative rest recommended until acute symptoms improve. Heat to affected area as needed for local pain relief. Follow-up in 2 weeks. if not improving or with new/progressive sx -     predniSONE (DELTASONE) 20 MG tablet; 2 tablets daily for 3 days, 1 tablet daily for 4 days.  Fatty liver Discussed was noted on lung CT from 07/2019 Weight loss advised, avoid alcohol/tylenol, will monitor LFTs  Other fatigue Check basic labs, no other specific concerning sx, abdominal pain seems to be musculoskeletal etiology coming from back as can be elicited by pressure to lumbar musculature, no red flags, otherwise normal exam will try conservative intervention;  pending sleep apnea follow up study with Dr. Rexene Alberts; follow up in office if not improving.  -     CBC with Differential/Platelet -     COMPLETE METABOLIC PANEL WITH GFR -     TSH  Benign prostatic hyperplasia (BPH) with post-void dribbling D/c tamsulosin intolerance, discussed lifestyle, initiate stream again after completing after waiting 30-60 sec, discussed possible long term improvement with finasteride, after discussion will initiate for 12-18 months and evaluate benefit with this. If progressive sx, discussed possible referral to uro, though defer for now as no severe obstructive sx -     finasteride (PROSCAR) 5 MG tablet; Take 1 tablet (5 mg total) by mouth daily.    Further disposition pending results of  labs. Discussed med's effects and SE's.   Over 30 minutes of exam, counseling, chart review, and critical decision making was performed.   Future Appointments  Date Time Provider [place]  03/07/2020  8:45 AM Liane Comber, NP [facility] None  06/10/2020  9:30 AM Unk Pinto, MD [facility] None  12/05/2020  9:00 AM Unk Pinto, MD [facility] None    ------------------------------------------------------------------------------------------------------------------   HPI BP 130/70   Pulse (!) 55   Temp (!) 97 F (36.1 C)   Ht 5' 9.5" (1.765 m)   Wt 248 lb (112.5 kg)   SpO2 97%   BMI 36.10 kg/m   64 y.o.male presents for evaluation of 2 weeks of intermittent sharp left lateral abdominal pain/flank pain, woke up with pain suddenly, reports is intermittent but persistent, describes as sharp, localized, non-radiating, improved with rest, sitting or lying supine, hurts with sitting to standing and for a few steps, then will ease. He recalls he did a vigorous rowing exercise program the day prior to onset. Today he reports pain is improved and pain free. He has been taking meloxicam 15 mg daily for other chronic pain. Hasn't tried any other interventions. Denies nausea, vomiting, diarrhea, constipation, abdominal pain.   He mentions excessive axillary sweating, but states this is ongoing for many years and unchanged. Denies atypical drenching night sweats. Normal chest CT 07/2019 for former smoking history.   He reports worse fatigue in the last 3-5 weeks, long history of poor sleep, doesn't wake up feeling rested, had home sleep study 2 days ago, for BiPAP renewal by Dr. Rexene Alberts, results pending. He is UTD on lung cancer screening. Denies any other notable/specific symptoms.  BMI is Body mass index is 36.1 kg/m., he has been working on diet and exercise, has been doing a lot of extra exercise, working on weight loss. Using noom app and doing a lot of walking and hiking, down 6  lb since. Admits to 3 glasses of wine 1-2 times per week.  Wt Readings from Last 3 Encounters:  02/14/20 248 lb (112.5 kg)  01/22/20 248 lb (112.5 kg)  11/28/19 254 lb 9.6 oz (115.5 kg)   He admits stopped vitamin D supplement a few weeks ago  Lab Results  Component Value Date   VD25OH 49 11/28/2019   He is not on a B12 supplement Lab Results  Component Value Date   VITAMINB12 461 11/28/2019   He reports 2-3 episodes of nocturia, reports has post void dribbling, saturating underwear after he thinks done with urinating and will need to change, no sense of incomplete emptying, denies straining, slow stream. He was prescribed tamsulosin by previous provider but states caused dizziness the day after he would take and couldn't tolerate. Dizziness resolved after stopping med.  Lab Results  Component Value Date   PSA 0.8 11/28/2019   PSA 1.0 06/03/2018   PSA 0.9 02/22/2017      Past Medical History:  Diagnosis Date  . Arthritis   . Asthma   . Back pain   . Chronic pain of both shoulders   . Depression   . Dyspnea   . Elevated glucose   . Environmental allergies   . GERD (gastroesophageal reflux disease)   . HLD (hyperlipidemia)   . HTN (hypertension)   . Insomnia   . Obesity   . OSA on CPAP 08/01/2014  . Reflux   . Trigger finger      Allergies  Allergen Reactions  . Bee Venom Anaphylaxis  . Lisinopril Other (See Comments)    Patient intolerant of drug which caused his tongue to tingle.  . Celebrex [Celecoxib]     Current Outpatient Medications on File Prior to Visit  Medication Sig  . albuterol (PROVENTIL HFA;VENTOLIN HFA) 108 (90 Base) MCG/ACT inhaler Use 2 inhalations 15 - 20 minutes apart every 4 hours as needed to Rescue Asthma  . bisoprolol (ZEBETA) 10 MG tablet Take 1 tablet Daily for BP  . ciprofloxacin (CILOXAN) 0.3 % ophthalmic solution 2 gtt in eye(s) Q2Hours while awake for 2 days, then 2 gtt in eye(s) Q4Hours for 5 days.  . fenofibrate micronized  (LOFIBRA) 134 MG capsule Take 1 capsule Daily for Triglycerides (Blood Fats)  . gabapentin (NEURONTIN) 300 MG capsule Take 1 capsule (300 mg total) by mouth 3 (three) times daily as needed. For pain.  . meloxicam (MOBIC) 15 MG tablet Take 1/2 to 1 tablet Daily with Food for Pain & Inflammation  . montelukast (SINGULAIR) 10 MG tablet Take 1 tablet Daily for Allergies  . omeprazole (PRILOSEC) 20 MG capsule Take 1 capsule Daily for Indigestion & Heartburn  . phentermine (ADIPEX-P) 37.5 MG tablet Take 1 tablet every Morning for Dieting & Weight Loss  . sertraline (ZOLOFT) 50 MG tablet Take 1 tablet Daily for Mood  . valsartan-hydrochlorothiazide (DIOVAN HCT) 80-12.5 MG tablet Take 1 tablet by mouth daily. (Patient taking differently: Take 0.5 tablets by mouth daily. )  . ALPRAZolam (XANAX) 0.5 MG tablet Take 1/2-1 tablet 2 - 3 x /day ONLY if needed for Anxiety Attack &  limit to 5 days /week to avoid addiction (Patient not taking: Reported on 02/14/2020)  . tamsulosin (FLOMAX) 0.4 MG CAPS  capsule Take 1 capsule (0.4 mg total) by mouth daily after supper. (Patient not taking: Reported on 02/14/2020)   No current facility-administered medications on file prior to visit.    ROS: all negative except above.   Physical Exam:  BP 130/70   Pulse (!) 55   Temp (!) 97 F (36.1 C)   Ht 5' 9.5" (1.765 m)   Wt 248 lb (112.5 kg)   SpO2 97%   BMI 36.10 kg/m   General Appearance: Well nourished, obese male in no apparent distress. Eyes: PERRLA, conjunctiva no swelling or erythema ENT/Mouth: Ext aud canals clear, TMs without erythema, bulging. No erythema, swelling, or exudate on post pharynx.  Tonsils not swollen or erythematous. Hearing normal.  Neck: Supple, thyroid normal.  Respiratory: Respiratory effort normal, BS equal bilaterally without rales, rhonchi, wheezing or stridor.  Cardio: RRR with no MRGs. Brisk peripheral pulses without edema.  Abdomen: Soft, + BS.  LLQ tenderness without guarding,  rebound, hernias, masses. Lymphatics: Non tender without lymphadenopathy.  Musculoskeletal: Full ROM, 5/5 strength, normal gait. He has L lumbar paraspinal tenderness which aggravates L abdominal pain, resolves when pressure released. No midlines/spinous tenderness, SI joint tenderness. Neg straight leg raise.  Skin: Warm, dry without rashes, lesions, ecchymosis.  Neuro: Cranial nerves intact. Normal muscle tone, no cerebellar symptoms. Sensation intact.  Psych: Awake and oriented X 3, normal affect, Insight and Judgment appropriate.     Izora Ribas, NP 10:14 AM [facility]

## 2020-02-15 ENCOUNTER — Telehealth: Payer: Self-pay

## 2020-02-15 DIAGNOSIS — M79642 Pain in left hand: Secondary | ICD-10-CM | POA: Diagnosis not present

## 2020-02-15 DIAGNOSIS — M65331 Trigger finger, right middle finger: Secondary | ICD-10-CM | POA: Diagnosis not present

## 2020-02-15 DIAGNOSIS — Z4789 Encounter for other orthopedic aftercare: Secondary | ICD-10-CM | POA: Diagnosis not present

## 2020-02-15 LAB — CBC WITH DIFFERENTIAL/PLATELET
Absolute Monocytes: 556 cells/uL (ref 200–950)
Basophils Absolute: 32 cells/uL (ref 0–200)
Basophils Relative: 0.6 %
Eosinophils Absolute: 103 cells/uL (ref 15–500)
Eosinophils Relative: 1.9 %
HCT: 40.7 % (ref 38.5–50.0)
Hemoglobin: 13.9 g/dL (ref 13.2–17.1)
Lymphs Abs: 1733 cells/uL (ref 850–3900)
MCH: 31.1 pg (ref 27.0–33.0)
MCHC: 34.2 g/dL (ref 32.0–36.0)
MCV: 91.1 fL (ref 80.0–100.0)
MPV: 10.1 fL (ref 7.5–12.5)
Monocytes Relative: 10.3 %
Neutro Abs: 2975 cells/uL (ref 1500–7800)
Neutrophils Relative %: 55.1 %
Platelets: 185 10*3/uL (ref 140–400)
RBC: 4.47 10*6/uL (ref 4.20–5.80)
RDW: 12.3 % (ref 11.0–15.0)
Total Lymphocyte: 32.1 %
WBC: 5.4 10*3/uL (ref 3.8–10.8)

## 2020-02-15 LAB — COMPLETE METABOLIC PANEL WITH GFR
AG Ratio: 1.6 (calc) (ref 1.0–2.5)
ALT: 29 U/L (ref 9–46)
AST: 19 U/L (ref 10–35)
Albumin: 4.4 g/dL (ref 3.6–5.1)
Alkaline phosphatase (APISO): 76 U/L (ref 35–144)
BUN: 21 mg/dL (ref 7–25)
CO2: 32 mmol/L (ref 20–32)
Calcium: 9.5 mg/dL (ref 8.6–10.3)
Chloride: 105 mmol/L (ref 98–110)
Creat: 0.86 mg/dL (ref 0.70–1.25)
GFR, Est African American: 105 mL/min/{1.73_m2} (ref >=60)
GFR, Est Non African American: 91 mL/min/{1.73_m2} (ref >=60)
Globulin: 2.7 g/dL (calc) (ref 1.9–3.7)
Glucose, Bld: 106 mg/dL — ABNORMAL HIGH (ref 65–99)
Potassium: 4.8 mmol/L (ref 3.5–5.3)
Sodium: 143 mmol/L (ref 135–146)
Total Bilirubin: 0.6 mg/dL (ref 0.2–1.2)
Total Protein: 7.1 g/dL (ref 6.1–8.1)

## 2020-02-15 LAB — TSH: TSH: 1.58 mIU/L (ref 0.40–4.50)

## 2020-02-15 NOTE — Telephone Encounter (Signed)
-----   Message from Star Age, MD sent at 02/15/2020  7:52 AM EDT ----- Patient was last seen for his yearly check up on 01/22/20 and should qualify for new equipment. He has been on BiPAP of 17/13 with full compliance. He had a HST on 02/12/20.    Please call and notify the patient that the recent home sleep test showed obstructive sleep apnea in the severe range; he did not sleep very much and may not have achieved REM sleep. I would like to write for a new BiPAP machine with the current settings and we will fax order to his DME. He will need a FU for compliance check in less than 3 mo, may see NP.

## 2020-02-15 NOTE — Telephone Encounter (Signed)
I called pt. No answer, left a message asking pt to call me back.   

## 2020-02-15 NOTE — Addendum Note (Signed)
Addended by: Star Age on: 02/15/2020 07:52 AM   Modules accepted: Orders

## 2020-02-15 NOTE — Progress Notes (Signed)
Patient was last seen for his yearly check up on 01/22/20 and should qualify for new equipment. He has been on BiPAP of 17/13 with full compliance. He had a HST on 02/12/20.    Please call and notify the patient that the recent home sleep test showed obstructive sleep apnea in the severe range; he did not sleep very much and may not have achieved REM sleep. I would like to write for a new BiPAP machine with the current settings and we will fax order to his DME. He will need a FU for compliance check in less than 3 mo, may see NP.

## 2020-02-15 NOTE — Procedures (Signed)
Patient Information     First Name: George Last Name: Montgomery ID: [id number]  Birth Date: 04-17-1956 Age: 64 Gender: Male  Referring Provider: Unk Pinto, MD BMI: 36.9 (W=249 lb, H=5' 9'')  Neck Circ.:  18 '' Epworth:  11/24   Sleep Study Information    Study Date: Feb 12, 2020 S/H/A Version: 001.001.001.001 / 4.1.1528 / 51  History:    64 year old man with a history of cervical and lumbar degenerative spine disease, anxiety, depression, asthma, hypertension, obesity, and OSA, who presents for re-evaluation of his OSA, as he needs a new machine. He has been compliant with standard BiPAP therapy of 17/13 cm.  Summary & Diagnosis:     Severe OSA Recommendations:     This home sleep test confirms severe obstructive sleep apnea with a total AHI of 73.9/hour and O2 nadir of 89%. The patient has been compliant with his BiPAP machine and is eligible for new equipment. I will prescribe a new BiPAP machine with the current treatment settings, as his AHI is at goal on a pressure of 17/13 cm. The patient will be advised to be fully compliant with PAP therapy and follow up as per insurance requirement in within 3 months of starting the new equipment. Please note that untreated obstructive sleep apnea may carry additional perioperative morbidity. Patients with significant obstructive sleep apnea should receive perioperative PAP therapy and the surgeons and particularly the anesthesiologist should be informed of the diagnosis and the severity of the sleep disordered breathing. The patient should be cautioned not to drive, work at heights, or operate dangerous or heavy equipment when tired or sleepy. Review and reiteration of good sleep hygiene measures should be pursued with any patient. Other causes of the patient's symptoms, including circadian rhythm disturbances, an underlying mood disorder, medication effect and/or an underlying medical problem cannot be ruled out based on this test. Clinical correlation  is recommended. The patient and his referring provider will be notified of the test results. The patient will be seen in follow up in sleep clinic at [facility].  I certify that I have reviewed the raw data recording prior to the issuance of this report in accordance with the standards of the American Academy of Sleep Medicine (AASM).  Star Age, MD, PhD [facility]) Diplomat, ABPN (Neurology and Sleep)               Start Study Time: End Study Time: Total Recording Time:          10:12:51 PM      4:51:02 AM 6 h, 38 min  Total Sleep Time % REM of Sleep Time:  3 h, 34 min  8.4    Mean: 95 Minimum: 89 Maximum: 99  Mean of Desaturations Nadirs (%):   92  Oxygen Desaturation. %: 4-9 10-20 >20 Total  Events Number Total  193 100.0  0 0.0  0 0.0  193 100.0  Oxygen Saturation: <90 <=88 <85 <80 <70  Duration (minutes): Sleep % 0.7 0.0 0.3 0.0 0.0 0.0 0.0 0.0 0.0 0.0     Respiratory Indices       Total Events REM NREM All Night  pRDI: pAHI: ODI:  262  260  193 N/A N/A N/A N/A N/A N/A 74.5 73.9 54.9       Pulse Rate Statistics during Sleep (BPM)      Mean: 51 Minimum: 36 Maximum: 116    Sleep Summary  Oxygen Saturation Statistics   Indices are  calculated using technically valid sleep time of 3 h, 31 min. pRDI/pAHI are calculated using oxi desaturations ? 3% REM/NREM indices appear only if REM time > 30 min.                  Sleep Stages Chart                                                                             pAHI=73.9                                                                                              Mild              Moderate                    Severe                                                 5              15                    30

## 2020-02-16 NOTE — Telephone Encounter (Signed)
Pt returned call. Please call back when available. 

## 2020-02-19 NOTE — Telephone Encounter (Signed)
Patient called back in regards to missed call  

## 2020-02-19 NOTE — Telephone Encounter (Signed)
Took call from phone rep. I was able to discuss sleep study results and pt was agreeable to starting Bipap. I reviewed pap compliance and he will continue to use [facility] as his DME. I have sent order to [facility] and I have scheduled f/u appointment for 05/07/2020 @ 230 pm.  My chart message with information about bipap compliance, dme info and f/u appointment has been sent.

## 2020-02-21 ENCOUNTER — Other Ambulatory Visit: Payer: Self-pay | Admitting: Internal Medicine

## 2020-02-21 MED ORDER — ALPRAZOLAM 0.5 MG PO TABS: ORAL_TABLET | ORAL | 0 refills | Status: DC

## 2020-03-05 DIAGNOSIS — G4733 Obstructive sleep apnea (adult) (pediatric): Secondary | ICD-10-CM | POA: Diagnosis not present

## 2020-03-07 ENCOUNTER — Ambulatory Visit: Payer: No Typology Code available for payment source | Admitting: Adult Health

## 2020-03-13 DIAGNOSIS — M25511 Pain in right shoulder: Secondary | ICD-10-CM | POA: Diagnosis not present

## 2020-03-13 DIAGNOSIS — M25512 Pain in left shoulder: Secondary | ICD-10-CM | POA: Diagnosis not present

## 2020-03-15 ENCOUNTER — Ambulatory Visit: Payer: Medicare HMO | Admitting: Adult Health

## 2020-03-29 ENCOUNTER — Other Ambulatory Visit: Payer: Self-pay | Admitting: Internal Medicine

## 2020-03-29 DIAGNOSIS — I10 Essential (primary) hypertension: Secondary | ICD-10-CM

## 2020-04-05 DIAGNOSIS — G4733 Obstructive sleep apnea (adult) (pediatric): Secondary | ICD-10-CM | POA: Diagnosis not present

## 2020-04-10 ENCOUNTER — Other Ambulatory Visit: Payer: Self-pay

## 2020-04-10 ENCOUNTER — Ambulatory Visit (INDEPENDENT_AMBULATORY_CARE_PROVIDER_SITE_OTHER): Payer: Medicare HMO | Admitting: Adult Health

## 2020-04-10 ENCOUNTER — Encounter: Payer: Self-pay | Admitting: Adult Health

## 2020-04-10 VITALS — BP 138/74 | HR 54 | Temp 96.4°F | Wt 249.8 lb

## 2020-04-10 DIAGNOSIS — H1032 Unspecified acute conjunctivitis, left eye: Secondary | ICD-10-CM

## 2020-04-10 DIAGNOSIS — R5383 Other fatigue: Secondary | ICD-10-CM

## 2020-04-10 DIAGNOSIS — Z860100 Personal history of colon polyps, unspecified: Secondary | ICD-10-CM

## 2020-04-10 DIAGNOSIS — H1010 Acute atopic conjunctivitis, unspecified eye: Secondary | ICD-10-CM | POA: Diagnosis not present

## 2020-04-10 DIAGNOSIS — J309 Allergic rhinitis, unspecified: Secondary | ICD-10-CM

## 2020-04-10 MED ORDER — PREDNISONE 20 MG PO TABS: ORAL_TABLET | ORAL | 0 refills | Status: DC

## 2020-04-10 MED ORDER — FEXOFENADINE HCL 180 MG PO TABS: 180.0000 mg | ORAL_TABLET | Freq: Every day | ORAL | 2 refills | Status: DC

## 2020-04-10 MED ORDER — POLYMYXIN B-TRIMETHOPRIM 10000-0.1 UNIT/ML-% OP SOLN: 1.0000 [drp] | OPHTHALMIC | 0 refills | Status: DC

## 2020-04-10 NOTE — Progress Notes (Signed)
Assessment and Plan:  George Montgomery was seen today for cough, fatigue and nasal congestion.  Diagnoses and all orders for this visit:  Allergic rhinoconjunctivitis Discussed the importance of avoiding unnecessary antibiotic therapy. Suggested symptomatic OTC remedies. Nasal saline spray for congestion. Nasal steroids, allergy pill, oral steroids offered Follow up as needed. -     predniSONE (DELTASONE) 20 MG tablet; 2 tablets daily for 3 days, 1 tablet daily for 4 days. -     fexofenadine (ALLEGRA) 180 MG tablet; Take 1 tablet (180 mg total) by mouth daily.  Acute conjunctivitis of left eye, unspecified acute conjunctivitis type Hygiene explained, if symptoms increase or vision changes Eye Doctor ASAP -     trimethoprim-polymyxin b (POLYTRIM) ophthalmic solution; Place 1 drop into the left eye every 4 (four) hours. For 10 days. Dispense quantity sufficient.  Fatigue, unspecified type After extensive discussion and review per HPI, suspect correlates to days not taking phentermine He will monitor and keep a log Lab workup has been normal  UTD cancer screening excepting colonoscopy which I reminded him to schedule Follow up as scheduled or sooner if needed  History of colon polyps Reminded to schedule colonoscopy - due April 2021  Morbid obesity ([place]) Some benefit with phetermine - suggested 2-3 months then 1 week drug break  May try metformin which was prescribed by obesity clinic - however has diarrhea May benefit from ozempic or saxenda - will check insurance formulary, information provided Long discussion about weight loss, diet, and exercise Continue close monitring  Discussed med's effects and SE's.   Over 30 minutes of exam, counseling, chart review, and critical decision making was performed.   Future Appointments  Date Time Provider [place]  05/07/2020  2:30 PM Ward Givens, NP [facility] None  06/10/2020  9:30 AM Unk Pinto, MD [facility] None  12/05/2020  9:00  AM Unk Pinto, MD [facility] None    ------------------------------------------------------------------------------------------------------------------   HPI BP 138/74    Pulse (!) 54    Temp (!) 96.4 F (35.8 C)    Wt 249 lb 12.8 oz (113.3 kg)    SpO2 96%    BMI 36.36 kg/m   64 y.o.male with hx of OSA on CPAP, allergies presents for evaluation of upper respiratory sx, fatigue, frustrations with weight.    He reports got new bicycle, has been riding frequently, 1 week ago was riding on [address], started having itchy/waterly eyes, runny nose, scratchy throat, dry hacky cough (associates with post nasal drip, "irritated"), worst while outdoors, around trees. Denies headache, sore throat, dyspnea, CP. Does endorse some intermittent wheezing. Denies fever/chills. Has had complete covid 19 vaccine. Wife does not had any sx. Currently taking Singulair daily for asthma, albuterol PRN (has used with some benefit for wheezing when outdoors). Hasn't tried antihistamine.   In the last 1-2 days also reports redness, gritty sensation, purulent discharge/crusting of eye lid of left eye. Denies vision changes, pain.   He also reports 6 months having more fatigue, variable, will wake up up feeling tired and poorly rested, waking up multiple times at night (but this is ongoing for many years). Has had normal labs, just had CPAP machine adjusted after repeat sleep study. Denies CP, dyspnea, palpitations, edema, unintended weight loss. Tolerates exertion well, exercising extensively. Had normal stress test 01/2017.  After extensive discussion he correlates fatigue with days not taking phentermine (currently taking intermittently every few weeks).   He is a former smoker, 40 pack year hisotry, quit in 2014 Had normal CT  lung cancer screening 08/09/2019 Low risk PSA 11/28/2019 Due colonoscopy this year - 5 year follow up, 3 polyps removed in 2016 - DUE  BMI is Body mass index is 36.36 kg/m., he has been  working on diet and exercise. He is tracking food intake via noom, following with [facility], was given metformin for insulin resistance but never started. Taking phentermine 37.5 mg in AM 2-3 weeks at a time and taking extensive frequent drug breaks.  Wt Readings from Last 3 Encounters:  04/10/20 249 lb 12.8 oz (113.3 kg)  02/14/20 248 lb (112.5 kg)  01/22/20 248 lb (112.5 kg)   Lab Results  Component Value Date   TESTOSTERONE 295 11/28/2019   Lab Results  Component Value Date   HGBA1C 6.0 (H) 11/28/2019   Lab Results  Component Value Date   TSH 1.58 02/14/2020   Lab Results  Component Value Date   UXNATFTD32 202 11/28/2019     Past Medical History:  Diagnosis Date   Arthritis    Asthma    Back pain    Chronic pain of both shoulders    Depression    Dyspnea    Elevated glucose    Environmental allergies    GERD (gastroesophageal reflux disease)    HLD (hyperlipidemia)    HTN (hypertension)    Insomnia    Obesity    OSA on CPAP 08/01/2014   Reflux    Trigger finger      Allergies  Allergen Reactions   Bee Venom Anaphylaxis   Lisinopril Other (See Comments)    Patient intolerant of drug which caused his tongue to tingle.   Celebrex [Celecoxib]     Current Outpatient Medications on File Prior to Visit  Medication Sig   albuterol (PROVENTIL HFA;VENTOLIN HFA) 108 (90 Base) MCG/ACT inhaler Use 2 inhalations 15 - 20 minutes apart every 4 hours as needed to Rescue Asthma   ALPRAZolam (XANAX) 0.5 MG tablet Take 1/2-1 tablet 2 - 3 x /day ONLY if needed for Anxiety Attack &  limit to 5 days /week to avoid addiction   bisoprolol (ZEBETA) 10 MG tablet Take 1 tablet Daily for BP   ciprofloxacin (CILOXAN) 0.3 % ophthalmic solution 2 gtt in eye(s) Q2Hours while awake for 2 days, then 2 gtt in eye(s) Q4Hours for 5 days.   fenofibrate micronized (LOFIBRA) 134 MG capsule Take 1 capsule Daily for Triglycerides (Blood Fats)   finasteride  (PROSCAR) 5 MG tablet Take 1 tablet (5 mg total) by mouth daily.   meloxicam (MOBIC) 15 MG tablet Take 1/2 to 1 tablet Daily with Food for Pain & Inflammation   montelukast (SINGULAIR) 10 MG tablet Take 1 tablet Daily for Allergies   omeprazole (PRILOSEC) 20 MG capsule Take 1 capsule Daily for Indigestion & Heartburn   phentermine (ADIPEX-P) 37.5 MG tablet Take 1 tablet every Morning for Dieting & Weight Loss   sertraline (ZOLOFT) 50 MG tablet Take 1 tablet Daily for Mood   valsartan-hydrochlorothiazide (DIOVAN-HCT) 80-12.5 MG tablet Take 1 tablet Daily for BP (Patient taking differently: Take 1/2 tablet Daily for BP)   gabapentin (NEURONTIN) 300 MG capsule Take 1 capsule (300 mg total) by mouth 3 (three) times daily as needed. For pain. (Patient not taking: Reported on 04/10/2020)   predniSONE (DELTASONE) 20 MG tablet 2 tablets daily for 3 days, 1 tablet daily for 4 days.   No current facility-administered medications on file prior to visit.    ROS: all negative except above.   Physical  Exam:  BP 138/74    Pulse (!) 54    Temp (!) 96.4 F (35.8 C)    Wt 249 lb 12.8 oz (113.3 kg)    SpO2 96%    BMI 36.36 kg/m   General Appearance: Well nourished, obese male in no apparent distress. Eyes: PERRLA, EOMs, R conjunctiva no swelling or erythema, L conjuntiva erythematous with watery discharge Sinuses: No Frontal/maxillary tenderness ENT/Mouth: Ext aud canals clear, TMs without erythema, bulging. No erythema, swelling, or exudate on post pharynx.  Tonsils not swollen or erythematous. Hearing normal.  Neck: Supple, thyroid normal.  Respiratory: Respiratory effort normal, BS equal bilaterally without rales, rhonchi, wheezing or stridor.  Cardio: RRR with no MRGs. Brisk peripheral pulses without edema.  Abdomen: Soft, non-distended obese abdomen limiting exam, + BS.  Non tender, no guarding, rebound, palpable hernias, masses. Lymphatics: Non tender without lymphadenopathy.   Musculoskeletal: Full ROM, 5/5 strength, normal gait.  Skin: Warm, dry without rashes, lesions, ecchymosis.  Neuro: Normal muscle tone, no cerebellar symptoms. Sensation intact.  Psych: Awake and oriented X 3, normal affect, Insight and Judgment appropriate.     Izora Ribas, NP 3:33 PM [facility]

## 2020-04-10 NOTE — Patient Instructions (Addendum)
Would suggest getting on claritin or allegra - daily in the morning  Could try low dose benadryl 25 mg in the evening   Check with insurance about formulary coverage with GLP1 inhibitors - ozempic, trulicity, etc     Semaglutide injection solution What is this medicine? SEMAGLUTIDE (Sem a GLOO tide) is used to improve blood sugar control in adults with type 2 diabetes. This medicine may be used with other diabetes medicines. This drug may also reduce the risk of heart attack or stroke if you have type 2 diabetes and risk factors for heart disease. This medicine may be used for other purposes; ask your health care provider or pharmacist if you have questions. COMMON BRAND NAME(S): OZEMPIC What should I tell my health care provider before I take this medicine? They need to know if you have any of these conditions:  endocrine tumors (MEN 2) or if someone in your family had these tumors  eye disease, vision problems  history of pancreatitis  kidney disease  stomach problems  thyroid cancer or if someone in your family had thyroid cancer  an unusual or allergic reaction to semaglutide, other medicines, foods, dyes, or preservatives  pregnant or trying to get pregnant  breast-feeding How should I use this medicine? This medicine is for injection under the skin of your upper leg (thigh), stomach area, or upper arm. It is given once every week (every 7 days). You will be taught how to prepare and give this medicine. Use exactly as directed. Take your medicine at regular intervals. Do not take it more often than directed. If you use this medicine with insulin, you should inject this medicine and the insulin separately. Do not mix them together. Do not give the injections right next to each other. Change (rotate) injection sites with each injection. It is important that you put your used needles and syringes in a special sharps container. Do not put them in a trash can. If you do not have  a sharps container, call your pharmacist or healthcare provider to get one. A special MedGuide will be given to you by the pharmacist with each prescription and refill. Be sure to read this information carefully each time. This drug comes with INSTRUCTIONS FOR USE. Ask your pharmacist for directions on how to use this drug. Read the information carefully. Talk to your pharmacist or health care provider if you have questions. Talk to your pediatrician regarding the use of this medicine in children. Special care may be needed. Overdosage: If you think you have taken too much of this medicine contact a poison control center or emergency room at once. NOTE: This medicine is only for you. Do not share this medicine with others. What if I miss a dose? If you miss a dose, take it as soon as you can within 5 days after the missed dose. Then take your next dose at your regular weekly time. If it has been longer than 5 days after the missed dose, do not take the missed dose. Take the next dose at your regular time. Do not take double or extra doses. If you have questions about a missed dose, contact your health care provider for advice. What may interact with this medicine?  other medicines for diabetes Many medications may cause changes in blood sugar, these include:  alcohol containing beverages  antiviral medicines for HIV or AIDS  aspirin and aspirin-like drugs  certain medicines for blood pressure, heart disease, irregular heart beat  chromium  diuretics  male hormones, such as estrogens or progestins, birth control pills  fenofibrate  gemfibrozil  isoniazid  lanreotide  male hormones or anabolic steroids  MAOIs like Carbex, Eldepryl, Marplan, Nardil, and Parnate  medicines for weight loss  medicines for allergies, asthma, cold, or cough  medicines for depression, anxiety, or psychotic disturbances  niacin  nicotine  NSAIDs, medicines for pain and inflammation, like  ibuprofen or naproxen  octreotide  pasireotide  pentamidine  phenytoin  probenecid  quinolone antibiotics such as ciprofloxacin, levofloxacin, ofloxacin  some herbal dietary supplements  steroid medicines such as prednisone or cortisone  sulfamethoxazole; trimethoprim  thyroid hormones Some medications can hide the warning symptoms of low blood sugar (hypoglycemia). You may need to monitor your blood sugar more closely if you are taking one of these medications. These include:  beta-blockers, often used for high blood pressure or heart problems (examples include atenolol, metoprolol, propranolol)  clonidine  guanethidine  reserpine This list may not describe all possible interactions. Give your health care provider a list of all the medicines, herbs, non-prescription drugs, or dietary supplements you use. Also tell them if you smoke, drink alcohol, or use illegal drugs. Some items may interact with your medicine. What should I watch for while using this medicine? Visit your doctor or health care professional for regular checks on your progress. Drink plenty of fluids while taking this medicine. Check with your doctor or health care professional if you get an attack of severe diarrhea, nausea, and vomiting. The loss of too much body fluid can make it dangerous for you to take this medicine. A test called the HbA1C (A1C) will be monitored. This is a simple blood test. It measures your blood sugar control over the last 2 to 3 months. You will receive this test every 3 to 6 months. Learn how to check your blood sugar. Learn the symptoms of low and high blood sugar and how to manage them. Always carry a quick-source of sugar with you in case you have symptoms of low blood sugar. Examples include hard sugar candy or glucose tablets. Make sure others know that you can choke if you eat or drink when you develop serious symptoms of low blood sugar, such as seizures or unconsciousness. They  must get medical help at once. Tell your doctor or health care professional if you have high blood sugar. You might need to change the dose of your medicine. If you are sick or exercising more than usual, you might need to change the dose of your medicine. Do not skip meals. Ask your doctor or health care professional if you should avoid alcohol. Many nonprescription cough and cold products contain sugar or alcohol. These can affect blood sugar. Pens should never be shared. Even if the needle is changed, sharing may result in passing of viruses like hepatitis or HIV. Wear a medical ID bracelet or chain, and carry a card that describes your disease and details of your medicine and dosage times. Do not become pregnant while taking this medicine. Women should inform their doctor if they wish to become pregnant or think they might be pregnant. There is a potential for serious side effects to an unborn child. Talk to your health care professional or pharmacist for more information. What side effects may I notice from receiving this medicine? Side effects that you should report to your doctor or health care professional as soon as possible:  allergic reactions like skin rash, itching or hives, swelling of the face, lips, or  tongue  breathing problems  changes in vision  diarrhea that continues or is severe  lump or swelling on the neck  severe nausea  signs and symptoms of infection like fever or chills; cough; sore throat; pain or trouble passing urine  signs and symptoms of low blood sugar such as feeling anxious, confusion, dizziness, increased hunger, unusually weak or tired, sweating, shakiness, cold, irritable, headache, blurred vision, fast heartbeat, loss of consciousness  signs and symptoms of kidney injury like trouble passing urine or change in the amount of urine  trouble swallowing  unusual stomach upset or pain  vomiting Side effects that usually do not require medical  attention (report to your doctor or health care professional if they continue or are bothersome):  constipation  diarrhea  nausea  pain, redness, or irritation at site where injected  stomach upset This list may not describe all possible side effects. Call your doctor for medical advice about side effects. You may report side effects to FDA at 1-800-FDA-1088. Where should I keep my medicine? Keep out of the reach of children. Store unopened pens in a refrigerator between 2 and 8 degrees C (36 and 46 degrees F). Do not freeze. Protect from light and heat. After you first use the pen, it can be stored for 56 days at room temperature between 15 and 30 degrees C (59 and 86 degrees F) or in a refrigerator. Throw away your used pen after 56 days or after the expiration date, whichever comes first. Do not store your pen with the needle attached. If the needle is left on, medicine may leak from the pen. NOTE: This sheet is a summary. It may not cover all possible information. If you have questions about this medicine, talk to your doctor, pharmacist, or health care provider.  2020 Elsevier/Gold Standard (2019-06-20 09:41:51)

## 2020-04-17 DIAGNOSIS — M25511 Pain in right shoulder: Secondary | ICD-10-CM | POA: Diagnosis not present

## 2020-04-17 DIAGNOSIS — M7541 Impingement syndrome of right shoulder: Secondary | ICD-10-CM | POA: Diagnosis not present

## 2020-04-17 DIAGNOSIS — M7542 Impingement syndrome of left shoulder: Secondary | ICD-10-CM | POA: Diagnosis not present

## 2020-04-17 DIAGNOSIS — M25512 Pain in left shoulder: Secondary | ICD-10-CM | POA: Diagnosis not present

## 2020-04-30 DIAGNOSIS — G4733 Obstructive sleep apnea (adult) (pediatric): Secondary | ICD-10-CM | POA: Diagnosis not present

## 2020-04-30 DIAGNOSIS — M65331 Trigger finger, right middle finger: Secondary | ICD-10-CM | POA: Diagnosis not present

## 2020-04-30 DIAGNOSIS — Z4789 Encounter for other orthopedic aftercare: Secondary | ICD-10-CM | POA: Diagnosis not present

## 2020-05-01 DIAGNOSIS — M7542 Impingement syndrome of left shoulder: Secondary | ICD-10-CM | POA: Diagnosis not present

## 2020-05-02 ENCOUNTER — Other Ambulatory Visit: Payer: Self-pay | Admitting: Internal Medicine

## 2020-05-02 DIAGNOSIS — M25512 Pain in left shoulder: Secondary | ICD-10-CM | POA: Insufficient documentation

## 2020-05-05 DIAGNOSIS — G4733 Obstructive sleep apnea (adult) (pediatric): Secondary | ICD-10-CM | POA: Diagnosis not present

## 2020-05-07 ENCOUNTER — Ambulatory Visit: Payer: Medicare HMO | Admitting: Adult Health

## 2020-05-07 ENCOUNTER — Other Ambulatory Visit: Payer: Self-pay

## 2020-05-07 ENCOUNTER — Encounter: Payer: Self-pay | Admitting: Adult Health

## 2020-05-07 VITALS — BP 139/63 | HR 53 | Ht 69.5 in | Wt 248.0 lb

## 2020-05-07 DIAGNOSIS — G4733 Obstructive sleep apnea (adult) (pediatric): Secondary | ICD-10-CM

## 2020-05-07 NOTE — Progress Notes (Signed)
PATIENT: George Montgomery DOB: [birth year]  REASON FOR VISIT: follow up HISTORY FROM: patient  HISTORY OF PRESENT ILLNESS: Today 05/07/20:  George Montgomery is a 64 year old male with a history of obstructive sleep apnea on biPAP.  His download indicates that he uses machine nightly for compliance of 100%.  He uses machine greater than 4 hours each night.  On average he uses his machine 8 hours and 55 minutes.  His residual AHI is 1.1 on 17/13 centimeters of water with 4 cm of water pressure support.  His leak in the 95th percentile is 28.3 L/min.  Reports that he is still getting used to the machine.  Overall he does feel that it does offer him some benefit.  HISTORY 01/22/2020: I reviewed his BiPAP compliance data from 11/19/2019 through 12/18/2019, which is a total of 30 days, during which time he used his machine every night with percent use days greater than 4 hours at 100%, indicating superb compliance, average usage of 9 hours and 5 minutes, residual AHI at goal at 1.8/h, pressure of 17/13, leak acceptable.  Data from after December 18, 2019 is not available.  He reports that he would like to have a new machine.  For some reason, we could not get the data from after 12/18/2019.  He is compliant with treatment.  He uses nasal pillows and has been getting his supplies online.  He has not had a sleep study in many years.  He is working on weight loss.  He would be willing to proceed with a home sleep test to reevaluate his sleep apnea diagnosis and getting new equipment, he is eligible for new machine.  He has received his first Covid vaccine dose, is due this week for his second dose, did well after the first 1.  REVIEW OF SYSTEMS: Out of a complete 14 system review of symptoms, the patient complains only of the following symptoms, and all other reviewed systems are negative.  FSS 17 ESS 5  ALLERGIES: Allergies  Allergen Reactions  . Bee Venom Anaphylaxis  . Lisinopril Other (See Comments)    Patient  intolerant of drug which caused his tongue to tingle.  . Celebrex [Celecoxib]     HOME MEDICATIONS: Outpatient Medications Prior to Visit  Medication Sig Dispense Refill  . albuterol (PROVENTIL HFA;VENTOLIN HFA) 108 (90 Base) MCG/ACT inhaler Use 2 inhalations 15 - 20 minutes apart every 4 hours as needed to Rescue Asthma 3 Inhaler 3  . ALPRAZolam (XANAX) 0.5 MG tablet Take 1/2-1 tablet 2 - 3 x /day ONLY if needed for Anxiety Attack &  limit to 5 days /week to avoid addiction 30 tablet 0  . bisoprolol (ZEBETA) 10 MG tablet Take 1 tablet Daily for BP 90 tablet 3  . ciprofloxacin (CILOXAN) 0.3 % ophthalmic solution 2 gtt in eye(s) Q2Hours while awake for 2 days, then 2 gtt in eye(s) Q4Hours for 5 days. 10 mL 0  . fenofibrate micronized (LOFIBRA) 134 MG capsule Take 1 capsule Daily for Triglycerides (Blood Fats) 90 capsule 0  . fexofenadine (ALLEGRA) 180 MG tablet Take 1 tablet (180 mg total) by mouth daily. 30 tablet 2  . finasteride (PROSCAR) 5 MG tablet Take 1 tablet (5 mg total) by mouth daily. 90 tablet 1  . gabapentin (NEURONTIN) 300 MG capsule Take 1 capsule (300 mg total) by mouth 3 (three) times daily as needed. For pain. (Patient not taking: Reported on 04/10/2020) 90 capsule 1  . meloxicam (MOBIC) 15 MG tablet Take  1/2 to 1 tablet Daily with Food for Pain & Inflammation 90 tablet 3  . montelukast (SINGULAIR) 10 MG tablet Take 1 tablet Daily for Allergies 90 tablet 0  . omeprazole (PRILOSEC) 20 MG capsule Take 1 capsule Daily for Indigestion & Heartburn 90 capsule 3  . phentermine (ADIPEX-P) 37.5 MG tablet TAKE 1 TABLET EVERY MORNING FOR DIETING & WEIGHT LOSS 90 tablet 1  . predniSONE (DELTASONE) 20 MG tablet 2 tablets daily for 3 days, 1 tablet daily for 4 days. 10 tablet 0  . sertraline (ZOLOFT) 50 MG tablet Take 1 tablet Daily for Mood 90 tablet 3  . trimethoprim-polymyxin b (POLYTRIM) ophthalmic solution Place 1 drop into the left eye every 4 (four) hours. For 10 days. Dispense quantity  sufficient. 10 mL 0  . valsartan-hydrochlorothiazide (DIOVAN-HCT) 80-12.5 MG tablet Take 1 tablet Daily for BP (Patient taking differently: Take 1/2 tablet Daily for BP) 90 tablet 1   No facility-administered medications prior to visit.    PAST MEDICAL HISTORY: Past Medical History:  Diagnosis Date  . Arthritis   . Asthma   . Back pain   . Chronic pain of both shoulders   . Depression   . Dyspnea   . Elevated glucose   . Environmental allergies   . GERD (gastroesophageal reflux disease)   . HLD (hyperlipidemia)   . HTN (hypertension)   . Insomnia   . Obesity   . OSA on CPAP 08/01/2014  . Reflux   . Trigger finger     PAST SURGICAL HISTORY: Past Surgical History:  Procedure Laterality Date  . APPENDECTOMY  07/2012  . HEMORRHOID SURGERY  09/2012  . HERNIA REPAIR      FAMILY HISTORY: Family History  Problem Relation Age of Onset  . Heart disease Mother   . Macular degeneration Mother   . Hypertension Mother   . Hyperlipidemia Mother   . Depression Mother   . Anxiety disorder Mother   . Cancer - Colon Other   . Cancer - Prostate Other   . Liver disease Father   . Alcoholism Father   . Stroke Maternal Aunt   . Cancer - Colon Paternal Uncle   . Cancer - Prostate Paternal Uncle     SOCIAL HISTORY: Social History   Socioeconomic History  . Marital status: Married    Spouse name: [name]  . Number of children: 4  . Years of education: college  . Highest education level: Not on file  Occupational History  . Occupation: Medical illustrator    Comment: [employer]  Tobacco Use  . Smoking status: Former Smoker    Packs/day: 1.00    Years: 40.00    Pack years: 40.00    Quit date: 12/03/2012    Years since quitting: 7.4  . Smokeless tobacco: Never Used  Substance and Sexual Activity  . Alcohol use: Yes    Alcohol/week: 2.0 standard drinks    Types: 2 Standard drinks or equivalent per week    Comment: occ  . Drug use: No  . Sexual activity: Not  on file  Other Topics Concern  . Not on file  Social History Narrative   Right handed, caffeine 3-4 cups daily, Married, 4 kids, 9 g kids., FT insurance agent (self employed). 4 yrs college   Social Determinants of Health   Financial Resource Strain:   . Difficulty of Paying Living Expenses:   Food Insecurity:   . Worried About Charity fundraiser in the Last Year:   .  Ran Out of Food in the Last Year:   Transportation Needs:   . Film/video editor (Medical):   Marland Kitchen Lack of Transportation (Non-Medical):   Physical Activity:   . Days of Exercise per Week:   . Minutes of Exercise per Session:   Stress:   . Feeling of Stress :   Social Connections:   . Frequency of Communication with Friends and Family:   . Frequency of Social Gatherings with Friends and Family:   . Attends Religious Services:   . Active Member of Clubs or Organizations:   . Attends Archivist Meetings:   Marland Kitchen Marital Status:   Intimate Partner Violence:   . Fear of Current or Ex-Partner:   . Emotionally Abused:   Marland Kitchen Physically Abused:   . Sexually Abused:       PHYSICAL EXAM  There were no vitals filed for this visit. There is no height or weight on file to calculate BMI.  Generalized: Well developed, in no acute distress  Chest: Lungs clear to auscultation bilaterally  Neurological examination  Mentation: Alert oriented to time, place, history taking. Follows all commands speech and language fluent Cranial nerve II-XII: Extraocular movements were full, visual field were full on confrontational test Head turning and shoulder shrug  were normal and symmetric. Motor: The motor testing reveals 5 over 5 strength of all 4 extremities. Good symmetric motor tone is noted throughout.  Sensory: Sensory testing is intact to soft touch on all 4 extremities. No evidence of extinction is noted.  Gait and station: Gait is normal.    DIAGNOSTIC DATA (LABS, IMAGING, TESTING) - I reviewed patient records,  labs, notes, testing and imaging myself where available.  Lab Results  Component Value Date   WBC 5.4 02/14/2020   HGB 13.9 02/14/2020   HCT 40.7 02/14/2020   MCV 91.1 02/14/2020   PLT 185 02/14/2020      Component Value Date/Time   NA 143 02/14/2020 1056   NA 140 12/22/2018 1254   K 4.8 02/14/2020 1056   CL 105 02/14/2020 1056   CO2 32 02/14/2020 1056   GLUCOSE 106 (H) 02/14/2020 1056   BUN 21 02/14/2020 1056   BUN 22 12/22/2018 1254   CREATININE 0.86 02/14/2020 1056   CALCIUM 9.5 02/14/2020 1056   PROT 7.1 02/14/2020 1056   PROT 7.2 12/22/2018 1254   ALBUMIN 4.7 12/22/2018 1254   AST 19 02/14/2020 1056   ALT 29 02/14/2020 1056   ALKPHOS 71 12/22/2018 1254   BILITOT 0.6 02/14/2020 1056   BILITOT 0.6 12/22/2018 1254   GFRNONAA 91 02/14/2020 1056   GFRAA 105 02/14/2020 1056   Lab Results  Component Value Date   CHOL 147 11/28/2019   HDL 31 (L) 11/28/2019   LDLCALC 84 11/28/2019   TRIG 227 (H) 11/28/2019   CHOLHDL 4.7 11/28/2019   Lab Results  Component Value Date   HGBA1C 6.0 (H) 11/28/2019   Lab Results  Component Value Date   VITAMINB12 461 11/28/2019   Lab Results  Component Value Date   TSH 1.58 02/14/2020      ASSESSMENT AND PLAN 64 y.o. year old male  has a past medical history of Arthritis, Asthma, Back pain, Chronic pain of both shoulders, Depression, Dyspnea, Elevated glucose, Environmental allergies, GERD (gastroesophageal reflux disease), HLD (hyperlipidemia), HTN (hypertension), Insomnia, Obesity, OSA on CPAP (08/01/2014), Reflux, and Trigger finger. here with:  1. OSA on BiPAP  - BiPAP compliance excellent - Good treatment of AHI  - Encourage  patient to use CPAP nightly and > 4 hours each night - F/U in 1 year or sooner if needed   I spent 20 minutes of face-to-face and non-face-to-face time with patient.  This included previsit chart review, lab review, study review, order entry, electronic health record documentation, patient  education.  Ward Givens, MSN, NP-C 05/07/2020, 2:14 PM [facility] [address], [place], [place] [address] [phone]

## 2020-05-07 NOTE — Patient Instructions (Addendum)
Continue using BiPAP nightly and greater than 4 hours each night If your symptoms worsen or you develop new symptoms please let us know.    

## 2020-05-08 ENCOUNTER — Other Ambulatory Visit: Payer: Self-pay | Admitting: Internal Medicine

## 2020-05-08 DIAGNOSIS — M25511 Pain in right shoulder: Secondary | ICD-10-CM | POA: Diagnosis not present

## 2020-05-08 DIAGNOSIS — M25512 Pain in left shoulder: Secondary | ICD-10-CM | POA: Diagnosis not present

## 2020-05-15 ENCOUNTER — Other Ambulatory Visit: Payer: Self-pay | Admitting: Internal Medicine

## 2020-05-16 ENCOUNTER — Other Ambulatory Visit: Payer: Self-pay | Admitting: Internal Medicine

## 2020-05-16 MED ORDER — FLUCONAZOLE 100 MG PO TABS
100.0000 mg | ORAL_TABLET | Freq: Every day | ORAL | 0 refills | Status: AC
Start: 2020-05-16 — End: 2020-05-30

## 2020-05-21 DIAGNOSIS — H5203 Hypermetropia, bilateral: Secondary | ICD-10-CM | POA: Diagnosis not present

## 2020-05-21 DIAGNOSIS — E119 Type 2 diabetes mellitus without complications: Secondary | ICD-10-CM | POA: Diagnosis not present

## 2020-05-23 DIAGNOSIS — M7541 Impingement syndrome of right shoulder: Secondary | ICD-10-CM | POA: Diagnosis not present

## 2020-05-26 DIAGNOSIS — T63441A Toxic effect of venom of bees, accidental (unintentional), initial encounter: Secondary | ICD-10-CM | POA: Diagnosis not present

## 2020-05-28 DIAGNOSIS — M71349 Other bursal cyst, unspecified hand: Secondary | ICD-10-CM | POA: Diagnosis not present

## 2020-05-28 DIAGNOSIS — M65331 Trigger finger, right middle finger: Secondary | ICD-10-CM | POA: Diagnosis not present

## 2020-06-05 DIAGNOSIS — M7541 Impingement syndrome of right shoulder: Secondary | ICD-10-CM | POA: Diagnosis not present

## 2020-06-05 DIAGNOSIS — G4733 Obstructive sleep apnea (adult) (pediatric): Secondary | ICD-10-CM | POA: Diagnosis not present

## 2020-06-10 ENCOUNTER — Ambulatory Visit: Payer: No Typology Code available for payment source | Admitting: Internal Medicine

## 2020-06-10 ENCOUNTER — Ambulatory Visit: Payer: PRIVATE HEALTH INSURANCE | Admitting: Orthopaedic Surgery

## 2020-06-10 DIAGNOSIS — M25561 Pain in right knee: Secondary | ICD-10-CM | POA: Diagnosis not present

## 2020-06-10 DIAGNOSIS — M238X1 Other internal derangements of right knee: Secondary | ICD-10-CM | POA: Diagnosis not present

## 2020-06-10 DIAGNOSIS — M2391 Unspecified internal derangement of right knee: Secondary | ICD-10-CM | POA: Diagnosis not present

## 2020-06-17 ENCOUNTER — Telehealth: Payer: Self-pay | Admitting: Orthopaedic Surgery

## 2020-06-17 DIAGNOSIS — G4733 Obstructive sleep apnea (adult) (pediatric): Secondary | ICD-10-CM | POA: Diagnosis not present

## 2020-06-17 NOTE — Telephone Encounter (Signed)
Patient called. He would like some cream ordered for his knee. Says it was a few years back and don;t remember the name of the cream. His number is [phone]

## 2020-06-18 ENCOUNTER — Other Ambulatory Visit: Payer: Self-pay

## 2020-06-18 NOTE — Telephone Encounter (Signed)
He is actually going to get this over the counter

## 2020-06-18 NOTE — Telephone Encounter (Signed)
LMOM for patient to call back and let me know what pharmacy to call this into

## 2020-07-03 ENCOUNTER — Ambulatory Visit (INDEPENDENT_AMBULATORY_CARE_PROVIDER_SITE_OTHER): Payer: Medicare HMO | Admitting: Internal Medicine

## 2020-07-03 ENCOUNTER — Encounter: Payer: Self-pay | Admitting: Internal Medicine

## 2020-07-03 ENCOUNTER — Other Ambulatory Visit: Payer: Self-pay

## 2020-07-03 VITALS — BP 122/60 | HR 55 | Temp 97.3°F | Resp 16 | Ht 69.5 in | Wt 246.6 lb

## 2020-07-03 DIAGNOSIS — J0410 Acute tracheitis without obstruction: Secondary | ICD-10-CM | POA: Diagnosis not present

## 2020-07-03 DIAGNOSIS — J0140 Acute pansinusitis, unspecified: Secondary | ICD-10-CM

## 2020-07-03 MED ORDER — DEXAMETHASONE 4 MG PO TABS: ORAL_TABLET | ORAL | 0 refills | Status: DC

## 2020-07-03 MED ORDER — BENZONATATE 200 MG PO CAPS: ORAL_CAPSULE | ORAL | 1 refills | Status: DC

## 2020-07-03 MED ORDER — PROMETHAZINE-DM 6.25-15 MG/5ML PO SYRP: ORAL_SOLUTION | ORAL | 1 refills | Status: DC

## 2020-07-03 MED ORDER — LEVOFLOXACIN 500 MG PO TABS: ORAL_TABLET | ORAL | 0 refills | Status: DC

## 2020-07-03 NOTE — Progress Notes (Signed)
History of Present Illness:      Medications  Current Outpatient Medications (Endocrine & Metabolic):  .  dexamethasone (DECADRON) 4 MG tablet, Take 1 tab 3 x day - 3 days, then 2 x day - 3 days, then 1 tab daily  Current Outpatient Medications (Cardiovascular):  .  bisoprolol (ZEBETA) 10 MG tablet, Take 1 tablet Daily for BP .  fenofibrate micronized (LOFIBRA) 134 MG capsule, Take 1 capsule Daily for Triglycerides (Blood Fats) .  valsartan-hydrochlorothiazide (DIOVAN-HCT) 80-12.5 MG tablet, Take 1 tablet Daily for BP (Patient taking differently: Take 1/2 tablet Daily for BP)  Current Outpatient Medications (Respiratory):  .  albuterol (VENTOLIN HFA) 108 (90 Base) MCG/ACT inhaler, USE 2 INHALATIONS 15-20 MINUTES APART EVERY 4 HOURS AS NEEDED TO RESCUE ASTHMA .  benzonatate (TESSALON) 200 MG capsule, Take 1 perle 3 x /day to prevent cough .  fexofenadine (ALLEGRA) 180 MG tablet, Take 1 tablet (180 mg total) by mouth daily. .  montelukast (SINGULAIR) 10 MG tablet, Take 1 tablet Daily for Allergies .  promethazine-dextromethorphan (PROMETHAZINE-DM) 6.25-15 MG/5ML syrup, Take 1 to 2 tsp enery 4 hours if needed for cough  Current Outpatient Medications (Analgesics):  .  meloxicam (MOBIC) 15 MG tablet, Take 1/2 to 1 tablet Daily with Food for Pain & Inflammation   Current Outpatient Medications (Other):  Marland Kitchen  ALPRAZolam (XANAX) 0.5 MG tablet, Take 1/2-1 tablet 2 - 3 x /day ONLY if needed for Anxiety Attack &  limit to 5 days /week to avoid addiction .  ciprofloxacin (CILOXAN) 0.3 % ophthalmic solution, 2 gtt in eye(s) Q2Hours while awake for 2 days, then 2 gtt in eye(s) Q4Hours for 5 days. .  finasteride (PROSCAR) 5 MG tablet, Take 1 tablet (5 mg total) by mouth daily. Marland Kitchen  levofloxacin (LEVAQUIN) 500 MG tablet, Take    1 tablet      Daily     with Food for Infection .  omeprazole (PRILOSEC) 20 MG capsule, Take 1 capsule Daily for Indigestion & Heartburn .  phentermine (ADIPEX-P) 37.5 MG  tablet, TAKE 1 TABLET EVERY MORNING FOR DIETING & WEIGHT LOSS .  trimethoprim-polymyxin b (POLYTRIM) ophthalmic solution, Place 1 drop into the left eye every 4 (four) hours. For 10 days. Dispense quantity sufficient.  Problem list He has Essential hypertension; Cluster headaches; OSA treated with BiPAP; Vitamin D deficiency; Testosterone deficiency; Morbid obesity ([place]); Other abnormal glucose; Hyperlipidemia, mixed; Screening for colorectal cancer; Onychomycosis; Dyspnea; Trigger finger, right ring finger; Bilateral hand pain; Depression; Chronic pain of both shoulders; Hand arthritis; Insomnia; Gastroesophageal reflux disease; Fatty liver; and Benign prostatic hyperplasia (BPH) with post-void dribbling on their problem list.   Observations/Objective:   BP 122/60   Pulse (!) 55   Temp (!) 97.3 F (36.3 C)   Resp 16   Ht 5' 9.5" (1.765 m)   Wt 246 lb 9.6 oz (111.9 kg)   SpO2 98%   BMI 35.89 kg/m   Dry raspy cough. No stridor.   HEENT -  EAC's & TM's NL. N?O?P - Clear. (+) tender Frontal & Maxillary areas. Neck - supple.  Chest -  Few scattered Insiratory rales. No rhonchi/wheezes.  Cor - Nl HS. RRR w/o sig MGR. PP 1(+). No edema. MS- FROM w/o deformities.  Gait Nl. Neuro -  Nl w/o focal abnormalities.  Assessment and Plan:  1. Acute non-recurrent pansinusitis  - levofloxacin  500 MG tablet; Take    1 tablet      Daily     with  Food for Infection  Disp: 10 tablet   - dexamethasone  4 MG tablet; Take 1 tab 3 x day - 3 days, then 2 x day - 3 days, then 1 tab daily  Disp: 20 tablet   2. Tracheitis  - levofloxacin  500 MG tablet; Take    1 tablet      Daily     with Food for Infection  Disp 10 tablet - dexamethasone  4 MG tablet; Take 1 tab 3 x day - 3 days, then 2 x day - 3 days, then 1 tab daily  Disp: 20 tablet  - promethazine-DM 6.25-15 MG/5ML syrup; Take 1 to 2 tsp enery 4 hours if needed for cough  Disp: 360 mL; Rf:1  - benzonatate  200 MG capsule; Take 1 perle 3 x /day  to prevent cough  Disp: 30 caps; Rf: 1   Follow Up Instructions:    I discussed the assessment and treatment plan with the patient. The patient was provided an opportunity to ask questions and all were answered. The patient agreed with the plan and demonstrated an understanding of the instructions.   The patient was advised to call back or seek an in-person evaluation if the symptoms worsen or if the condition fails to improve as anticipated.   Kirtland Bouchard, MD

## 2020-07-06 DIAGNOSIS — G4733 Obstructive sleep apnea (adult) (pediatric): Secondary | ICD-10-CM | POA: Diagnosis not present

## 2020-07-11 ENCOUNTER — Ambulatory Visit: Payer: Medicare HMO | Admitting: Adult Health

## 2020-07-16 DIAGNOSIS — R69 Illness, unspecified: Secondary | ICD-10-CM | POA: Diagnosis not present

## 2020-07-17 NOTE — Progress Notes (Signed)
WELCOME TO MEDICARE, WELLNESS VISIT AND 3 MONTH FOLLOW UP  Assessment:   [name] was seen today for welcome to medicare wellness and follow-up.  Diagnoses and all orders for this visit:  Welcome to Medicare preventive visit Yearly for annual wellness  Essential hypertension Continue current medications: Monitor blood pressure at home; call if consistently over 130/80 Continue DASH diet.   Reminder to go to the ER if any CP, SOB, nausea, dizziness, severe HA, changes vision/speech, left arm numbness and tingling and jaw pain. -     CBC with Differential/Platelet -     COMPLETE METABOLIC PANEL WITH GFR  Hyperlipidemia, mixed Continue medications: fenofibrate 134mg  Discussed dietary and exercise modifications Low fat diet -     Lipid panel  Vitamin D deficiency Continue supplementation to maintain goal of 70-100 Taking Vitamin D IU daily  Testosterone deficiency No supplementation at this time.  BPH with obstruction/lower urinary tract symptoms Doing well at this time Continue medications: finasteride Will continue to monitor Defer PSA this check  Fatty liver Discussed dietary and exercise modifications  Screening for AAA (abdominal aortic aneurysm) -     [place], RETROPERITNL ABD,  LTD  Acute non-recurrent pansinusitis Improved, completed ABX course  Thrush Likely related to previous ABX treatment Rx Nystatin liquid 19ml, QID, swish 75min or more, swallow. Rx Diflucan one tablet today, second tablet in three days if symptoms not improved. Discussed oral hygiene Continue to monitor  Seasonal allergic rhinitis due to pollen Stop loratadine, change to  Cetirizine, levacetirazine or fexofenadine  Medication management Continued  OSA treated with BiPap Reports wears nightly Discussed hygiene regarding equipment  Morbid Obesity (BMI 36) Discussed dietary and exercise modifications    Over 30 minutes of face to face interview, exam, counseling, chart review, and  critical decision making was performed  Future Appointments  Date Time Provider [place]  10/22/2020  3:30 PM Unk Pinto, MD [facility] None  01/29/2021  9:00 AM Liane Comber, NP [facility] None  06/03/2021  1:00 PM Ward Givens, NP [facility] None  07/08/2021  4:00 PM Liane Comber, NP [facility] None     Plan:   During the course of the visit the patient was educated and counseled about appropriate screening and preventive services including:    Pneumococcal vaccine   Influenza vaccine  Prevnar 13  Td vaccine  Screening electrocardiogram  Colorectal cancer screening  Diabetes screening  Glaucoma screening  Nutrition counseling    Subjective:  George Montgomery is a 64 y.o. male who presents for Medicare Annual Wellness Visit and 3 month follow up for HTN, HLD, BPH, prediabetes, and vitamin D Def.   Reports he has been battling pansinusitis over the course of the past month.  He reports that he has completed azithromycin and dexamethasone taper.  Symptoms were not resolved and was prescribed Levaquin.  He completed the full course as prescribed.  Reports his symptoms have improved but not resolved.  Today he reports he has a constant runny nose.  He is blowing his nose all the time, clear.  He is also coughing frequently, non-productive, it is worse at night.  He has been taking loratadine daily.  He has also tried promethazine cough syrup and tessalon pearls.  He reports he is not sure if it is effective  Denies any fevers, chills, headaches, chest pains or shortness of breath. He has been using his albuterol inhaler more frequently but reports it has not been helping.  He has history of asthma and taking montelukast  daily.  His blood pressure has been controlled at home, today their BP is BP: (!) 126/56 He does workout. He denies chest pain, shortness of breath, dizziness.  He is on cholesterol medication and denies myalgias. His cholesterol is at goal. The  cholesterol last visit was:   Lab Results  Component Value Date   CHOL 147 11/28/2019   HDL 31 (L) 11/28/2019   LDLCALC 84 11/28/2019   TRIG 227 (H) 11/28/2019   CHOLHDL 4.7 11/28/2019   He has been working on diet and exercise for prediabetes, and denies polydipsia, polyuria, visual disturbances, vomiting and weight loss. Last A1C in the office was:  Lab Results  Component Value Date   HGBA1C 6.0 (H) 11/28/2019   Last GFR Lab Results  Component Value Date   GFRNONAA 91 02/14/2020     Lab Results  Component Value Date   GFRAA 105 02/14/2020   Patient is on Vitamin D supplement.   Lab Results  Component Value Date   VD25OH 88 11/28/2019     BPH Currently he is symptomatic and endorses nocturia x 1 a night and denies urinary hesitancy, incomplete voiding, double voiding, weak stream, perineal discomfort, dysuria, hematuria, abdominal pain, flank pain and testicular pain.  He is taking Finasteride (Propecia).  Reports he no longer has dribbling after he urinates since starting finasteride. He is not following with urology at this time and last results were in normal. Lab Results  Component Value Date   PSA 0.8 11/28/2019       Medication Review:   Current Outpatient Medications (Cardiovascular):  .  bisoprolol (ZEBETA) 10 MG tablet, Take 1 tablet Daily for BP .  fenofibrate micronized (LOFIBRA) 134 MG capsule, Take 1 capsule Daily for Triglycerides (Blood Fats) .  valsartan-hydrochlorothiazide (DIOVAN-HCT) 80-12.5 MG tablet, Take 1 tablet Daily for BP (Patient taking differently: Take 1/2 tablet Daily for BP)  Current Outpatient Medications (Respiratory):  .  albuterol (VENTOLIN HFA) 108 (90 Base) MCG/ACT inhaler, USE 2 INHALATIONS 15-20 MINUTES APART EVERY 4 HOURS AS NEEDED TO RESCUE ASTHMA .  fexofenadine (ALLEGRA) 180 MG tablet, Take 1 tablet (180 mg total) by mouth daily. .  montelukast (SINGULAIR) 10 MG tablet, Take 1 tablet Daily for Allergies  Current  Outpatient Medications (Analgesics):  .  meloxicam (MOBIC) 15 MG tablet, Take 1/2 to 1 tablet Daily with Food for Pain & Inflammation   Current Outpatient Medications (Other):  Marland Kitchen  ALPRAZolam (XANAX) 0.5 MG tablet, Take 1/2-1 tablet 2 - 3 x /day ONLY if needed for Anxiety Attack &  limit to 5 days /week to avoid addiction .  ciprofloxacin (CILOXAN) 0.3 % ophthalmic solution, 2 gtt in eye(s) Q2Hours while awake for 2 days, then 2 gtt in eye(s) Q4Hours for 5 days. .  finasteride (PROSCAR) 5 MG tablet, Take 1 tablet (5 mg total) by mouth daily. .  fluconazole (DIFLUCAN) 150 MG tablet, Take one tablet at onset of symptoms and second tablet three days later for yeast. .  nystatin (MYCOSTATIN) 100000 UNIT/ML suspension, 5 ml four times a day, retain in mouth as long as possible (Swish and Swallow).  Use for 48 hours after symptoms resolve. Marland Kitchen  omeprazole (PRILOSEC) 20 MG capsule, Take 1 capsule Daily for Indigestion & Heartburn .  phentermine (ADIPEX-P) 37.5 MG tablet, TAKE 1 TABLET EVERY MORNING FOR DIETING & WEIGHT LOSS .  trimethoprim-polymyxin b (POLYTRIM) ophthalmic solution, Place 1 drop into the left eye every 4 (four) hours. For 10 days. Dispense quantity sufficient.  Allergies:  Allergies  Allergen Reactions  . Bee Venom Anaphylaxis  . Pine Anaphylaxis  . Lisinopril Other (See Comments)    Patient intolerant of drug which caused his tongue to tingle.  . Celebrex [Celecoxib]     Current Problems (verified) has Essential hypertension; Cluster headaches; OSA treated with BiPAP; Vitamin D deficiency; Testosterone deficiency; Morbid obesity ([place]); Hyperlipidemia, mixed; Screening for colorectal cancer; Dyspnea; Trigger finger, right ring finger; Bilateral hand pain; Depression; Chronic pain of both shoulders; Insomnia; Gastroesophageal reflux disease; Fatty liver; and Benign prostatic hyperplasia (BPH) with post-void dribbling on their problem list.  Screening Tests Immunization History   Administered Date(s) Administered  . Influenza Inj Mdck Quad With Preservative 08/19/2017, 07/25/2019  . Influenza Split 08/06/2015  . Influenza,inj,quad, With Preservative 08/20/2016  . PPD Test 06/03/2018, 11/28/2019  . Pneumococcal Polysaccharide-23 08/20/2016  . Tdap 11/06/2016  . Zoster 11/06/2016    Preventative care: Last colonoscopy: 2016   Prior vaccinations: TD or Tdap: 2018  Influenza: Due for 2021  Pneumococcal: 2017 Shingles/Zostavax: 2018  Names of Other Physician/Practitioners you currently use: 1. [facility] here for primary care 2. [facility], 04/2020 3. Dental Exam Dr Kalman Shan, Peridontal disease Q9month, 03/2020 Patient Care Team: Unk Pinto, MD as PCP - General (Internal Medicine)   Surgical: He  has a past surgical history that includes Hemorrhoid surgery (09/2012); Appendectomy (07/2012); and Hernia repair. Family His family history includes Alcoholism in his father; Anxiety disorder in his mother; Cancer - Colon in his paternal uncle and another family member; Cancer - Prostate in his paternal uncle and another family member; Depression in his mother; Heart disease in his mother; Hyperlipidemia in his mother; Hypertension in his mother; Liver disease in his father; Macular degeneration in his mother; Stroke in his maternal aunt. Social history  He reports that he quit smoking about 7 years ago. He has a 40.00 pack-year smoking history. He has never used smokeless tobacco. He reports current alcohol use of about 2.0 standard drinks of alcohol per week. He reports that he does not use drugs.   MEDICARE WELLNESS OBJECTIVES: Physical activity: Current Exercise Habits: Home exercise routine, Type of exercise: Other - see comments (Rides his bike, hiking active around the home), Time (Minutes): 60, Frequency (Times/Week): 3, Weekly Exercise (Minutes/Week): 180, Intensity: Moderate, Exercise limited by:  None identified Cardiac risk factors: Cardiac Risk Factors include: advanced age (>20men, >73 women);hypertension;dyslipidemia;male gender;obesity (BMI >30kg/m2) Depression/mood screen:   Depression screen [facility] 2/9 07/18/2020  Decreased Interest 0  Down, Depressed, Hopeless 0  PHQ - 2 Score 0  Altered sleeping -  Tired, decreased energy -  Change in appetite -  Feeling bad or failure about yourself  -  Trouble concentrating -  Moving slowly or fidgety/restless -  Suicidal thoughts -  PHQ-9 Score -  Difficult doing work/chores -    ADLs:  In your present state of health, do you have any difficulty performing the following activities: 07/18/2020 11/27/2019  Hearing? N N  Vision? N N  Difficulty concentrating or making decisions? N N  Walking or climbing stairs? N N  Dressing or bathing? N N  Doing errands, shopping? N N  Preparing Food and eating ? N -  Using the Toilet? N -  In the past six months, have you accidently leaked urine? N -  Do you have problems with loss of bowel control? N -  Managing your Medications? N -  Managing your Finances? N -  Housekeeping or  managing your Housekeeping? N -  Some recent data might be hidden     Cognitive Testing  Alert? Yes  Normal Appearance?Yes  Oriented to person? Yes  Place? Yes   Time? Yes  Recall of three objects?  Yes  Can perform simple calculations? Yes  Displays appropriate judgment?Yes  Can read the correct time from a watch face?Yes  EOL planning: Does Patient Have a Medical Advance Directive?: Yes Type of Advance Directive: Healthcare Power of Attorney, Living will Copy of [place] in Chart?: No - copy requested   Objective:   Today's Vitals   07/18/20 1037  BP: (!) 126/56  Pulse: (!) 58  Resp: 16  Temp: 97.6 F (36.4 C)  SpO2: 97%  Weight: 247 lb 6.4 oz (112.2 kg)   Body mass index is 36.01 kg/m.  General appearance: alert, no distress, WD/WN, male HEENT: normocephalic, sclerae  anicteric, TMs pearly, nares patent, no discharge or erythema, pharynx normal Oral cavity: MMM, no lesions Neck: supple, no lymphadenopathy, no thyromegaly, no masses Heart: RRR, normal S1, S2, no murmurs Lungs: CTA bilaterally, no wheezes, rhonchi, or rales Abdomen: +bs, soft, non tender, non distended, no masses, no hepatomegaly, no splenomegaly. diastasis with hernia noted. Musculoskeletal: nontender, no swelling, no obvious deformity Extremities: no edema, no cyanosis, no clubbing Pulses: 2+ symmetric, upper and lower extremities, normal cap refill Neurological: alert, oriented x 3, CN2-12 intact, strength normal upper extremities and lower extremities, sensation normal throughout, DTRs 2+ throughout, no cerebellar signs, gait normal Psychiatric: normal affect, behavior normal, pleasant   Aorta scan: less than 3cm, WNL  Medicare Attestation I have personally reviewed: The patient's medical and social history Their use of alcohol, tobacco or illicit drugs Their current medications and supplements The patient's functional ability including ADLs,fall risks, home safety risks, cognitive, and hearing and visual impairment Diet and physical activities Evidence for depression or mood disorders  The patient's weight, height, BMI, and visual acuity have been recorded in the chart.  I have made referrals, counseling, and provided education to the patient based on review of the above and I have provided the patient with a written personalized care plan for preventive services.      Garnet Sierras, NP [facility] 07/18/2020  11:55 PM

## 2020-07-18 ENCOUNTER — Encounter: Payer: Self-pay | Admitting: Adult Health Nurse Practitioner

## 2020-07-18 ENCOUNTER — Ambulatory Visit (INDEPENDENT_AMBULATORY_CARE_PROVIDER_SITE_OTHER): Payer: Medicare HMO | Admitting: Adult Health Nurse Practitioner

## 2020-07-18 ENCOUNTER — Other Ambulatory Visit: Payer: Self-pay

## 2020-07-18 VITALS — BP 126/56 | HR 58 | Temp 97.6°F | Resp 16 | Wt 247.4 lb

## 2020-07-18 DIAGNOSIS — Z0000 Encounter for general adult medical examination without abnormal findings: Secondary | ICD-10-CM

## 2020-07-18 DIAGNOSIS — E559 Vitamin D deficiency, unspecified: Secondary | ICD-10-CM | POA: Diagnosis not present

## 2020-07-18 DIAGNOSIS — J0140 Acute pansinusitis, unspecified: Secondary | ICD-10-CM

## 2020-07-18 DIAGNOSIS — K760 Fatty (change of) liver, not elsewhere classified: Secondary | ICD-10-CM | POA: Diagnosis not present

## 2020-07-18 DIAGNOSIS — N401 Enlarged prostate with lower urinary tract symptoms: Secondary | ICD-10-CM

## 2020-07-18 DIAGNOSIS — Z79899 Other long term (current) drug therapy: Secondary | ICD-10-CM

## 2020-07-18 DIAGNOSIS — E349 Endocrine disorder, unspecified: Secondary | ICD-10-CM | POA: Diagnosis not present

## 2020-07-18 DIAGNOSIS — G4733 Obstructive sleep apnea (adult) (pediatric): Secondary | ICD-10-CM | POA: Diagnosis not present

## 2020-07-18 DIAGNOSIS — E782 Mixed hyperlipidemia: Secondary | ICD-10-CM

## 2020-07-18 DIAGNOSIS — B370 Candidal stomatitis: Secondary | ICD-10-CM | POA: Diagnosis not present

## 2020-07-18 DIAGNOSIS — N138 Other obstructive and reflux uropathy: Secondary | ICD-10-CM

## 2020-07-18 DIAGNOSIS — J301 Allergic rhinitis due to pollen: Secondary | ICD-10-CM

## 2020-07-18 DIAGNOSIS — Z136 Encounter for screening for cardiovascular disorders: Secondary | ICD-10-CM

## 2020-07-18 DIAGNOSIS — I10 Essential (primary) hypertension: Secondary | ICD-10-CM

## 2020-07-18 DIAGNOSIS — Z0100 Encounter for examination of eyes and vision without abnormal findings: Secondary | ICD-10-CM | POA: Diagnosis not present

## 2020-07-18 MED ORDER — FLUCONAZOLE 150 MG PO TABS: ORAL_TABLET | ORAL | 0 refills | Status: DC

## 2020-07-18 MED ORDER — NYSTATIN 100000 UNIT/ML MT SUSP: OROMUCOSAL | 0 refills | Status: DC

## 2020-07-18 NOTE — Patient Instructions (Addendum)
You have Thrush in your mouth.  We ar sending in Nystatin mouth wash and a tablet.    Allergy Symptoms / Runny Nose:   Stop th Claritin / Loratadine Pick one from below you can get these at any pharmacy   Zyrtec / Cetirizine Take 10mg  by mouth May cause drowsiness, take nightly Be sure to drink plenty of water If this is not effective, try Xyzal or Allegra  Xyzal / Levocetirazine  Take 5mg  by mouth May cause drowsiness, take nightly Be sure to drink plenty of water If this is not effective try Allegra or Zyrtec  Allegra / fexofenadine Take 180mg  by mouth daily If this is not effective try Zyrtec or Xyzal    Continue taking: Singular / Montelukast Take 10mg  every day    Cough:  Delsym Cough Syrup: 43ml every 4 hours as needed for cough.   This is a cough suppressant.      George Montgomery , Thank you for taking time to come for your Medicare Wellness Visit. I appreciate your ongoing commitment to your health goals. Please review the following plan we discussed and let me know if I can assist you in the future.     This is a list of the screening recommended for you and due dates:  Health Maintenance  Topic Date Due  .  Hepatitis C: One time screening is recommended by Center for Disease Control  (CDC) for  adults born from [birth year] through 1966.   Never done  . HIV Screening  Never done  . Pneumonia vaccines (1 of 2 - PCV13) 01/19/2020  . Colon Cancer Screening  02/13/2020  . Flu Shot  05/19/2020  . Tetanus Vaccine  11/06/2026  . COVID-19 Vaccine  Completed

## 2020-07-19 ENCOUNTER — Ambulatory Visit: Payer: Medicare HMO | Admitting: Adult Health

## 2020-07-19 LAB — COMPLETE METABOLIC PANEL WITH GFR
AG Ratio: 1.7 (calc) (ref 1.0–2.5)
ALT: 41 U/L (ref 9–46)
AST: 21 U/L (ref 10–35)
Albumin: 4 g/dL (ref 3.6–5.1)
Alkaline phosphatase (APISO): 46 U/L (ref 35–144)
BUN: 22 mg/dL (ref 7–25)
CO2: 31 mmol/L (ref 20–32)
Calcium: 9.4 mg/dL (ref 8.6–10.3)
Chloride: 105 mmol/L (ref 98–110)
Creat: 0.8 mg/dL (ref 0.70–1.25)
GFR, Est African American: 109 mL/min/{1.73_m2} (ref >=60)
GFR, Est Non African American: 94 mL/min/{1.73_m2} (ref >=60)
Globulin: 2.3 g/dL (calc) (ref 1.9–3.7)
Glucose, Bld: 75 mg/dL (ref 65–99)
Potassium: 4.8 mmol/L (ref 3.5–5.3)
Sodium: 142 mmol/L (ref 135–146)
Total Bilirubin: 0.5 mg/dL (ref 0.2–1.2)
Total Protein: 6.3 g/dL (ref 6.1–8.1)

## 2020-07-19 LAB — CBC WITH DIFFERENTIAL/PLATELET
Absolute Monocytes: 835 cells/uL (ref 200–950)
Basophils Absolute: 29 cells/uL (ref 0–200)
Basophils Relative: 0.3 %
Eosinophils Absolute: 96 cells/uL (ref 15–500)
Eosinophils Relative: 1 %
HCT: 40.5 % (ref 38.5–50.0)
Hemoglobin: 13.9 g/dL (ref 13.2–17.1)
Lymphs Abs: 2544 cells/uL (ref 850–3900)
MCH: 32.5 pg (ref 27.0–33.0)
MCHC: 34.3 g/dL (ref 32.0–36.0)
MCV: 94.6 fL (ref 80.0–100.0)
MPV: 10 fL (ref 7.5–12.5)
Monocytes Relative: 8.7 %
Neutro Abs: 6096 cells/uL (ref 1500–7800)
Neutrophils Relative %: 63.5 %
Platelets: 174 10*3/uL (ref 140–400)
RBC: 4.28 10*6/uL (ref 4.20–5.80)
RDW: 12.8 % (ref 11.0–15.0)
Total Lymphocyte: 26.5 %
WBC: 9.6 10*3/uL (ref 3.8–10.8)

## 2020-07-19 LAB — LIPID PANEL
Cholesterol: 144 mg/dL (ref <200)
HDL: 44 mg/dL (ref >=40)
LDL Cholesterol (Calc): 81 mg/dL (calc)
Non-HDL Cholesterol (Calc): 100 mg/dL (calc) (ref <130)
Total CHOL/HDL Ratio: 3.3 (calc) (ref <5.0)
Triglycerides: 92 mg/dL (ref <150)

## 2020-07-30 ENCOUNTER — Other Ambulatory Visit: Payer: Self-pay | Admitting: Internal Medicine

## 2020-07-30 MED ORDER — AMOXICILLIN 250 MG PO CAPS: ORAL_CAPSULE | ORAL | 0 refills | Status: DC

## 2020-08-01 ENCOUNTER — Other Ambulatory Visit: Payer: Self-pay | Admitting: *Deleted

## 2020-08-01 DIAGNOSIS — Z87891 Personal history of nicotine dependence: Secondary | ICD-10-CM

## 2020-08-01 MED ORDER — AMOXICILLIN 500 MG PO CAPS: 500.0000 mg | ORAL_CAPSULE | Freq: Three times a day (TID) | ORAL | 0 refills | Status: DC

## 2020-08-05 DIAGNOSIS — Z20822 Contact with and (suspected) exposure to covid-19: Secondary | ICD-10-CM | POA: Diagnosis not present

## 2020-08-05 DIAGNOSIS — G4733 Obstructive sleep apnea (adult) (pediatric): Secondary | ICD-10-CM | POA: Diagnosis not present

## 2020-08-06 ENCOUNTER — Telehealth: Payer: Self-pay

## 2020-08-06 ENCOUNTER — Ambulatory Visit
Admission: RE | Admit: 2020-08-06 | Discharge: 2020-08-06 | Disposition: A | Discharge status: Home / Self Care | Payer: Medicare HMO | Source: Ambulatory Visit | Attending: Adult Health Nurse Practitioner | Admitting: Adult Health Nurse Practitioner

## 2020-08-06 ENCOUNTER — Other Ambulatory Visit: Payer: Self-pay

## 2020-08-06 ENCOUNTER — Other Ambulatory Visit: Payer: Self-pay | Admitting: Adult Health Nurse Practitioner

## 2020-08-06 DIAGNOSIS — R5383 Other fatigue: Secondary | ICD-10-CM | POA: Diagnosis not present

## 2020-08-06 DIAGNOSIS — R059 Cough, unspecified: Secondary | ICD-10-CM | POA: Diagnosis not present

## 2020-08-06 NOTE — Telephone Encounter (Signed)
Patient states that he has been sick for 6 weeks and is not better.  Took a Covid test yesterday and it was negative. Requesting an order for a chest xray. Please advise.

## 2020-08-07 ENCOUNTER — Other Ambulatory Visit: Payer: Self-pay | Admitting: Adult Health

## 2020-08-07 DIAGNOSIS — R69 Illness, unspecified: Secondary | ICD-10-CM | POA: Diagnosis not present

## 2020-08-12 ENCOUNTER — Other Ambulatory Visit: Payer: Self-pay | Admitting: Internal Medicine

## 2020-08-13 ENCOUNTER — Other Ambulatory Visit: Payer: Self-pay | Admitting: Internal Medicine

## 2020-08-16 DIAGNOSIS — R059 Cough, unspecified: Secondary | ICD-10-CM | POA: Diagnosis not present

## 2020-08-16 DIAGNOSIS — J019 Acute sinusitis, unspecified: Secondary | ICD-10-CM | POA: Diagnosis not present

## 2020-08-17 DIAGNOSIS — G4733 Obstructive sleep apnea (adult) (pediatric): Secondary | ICD-10-CM | POA: Diagnosis not present

## 2020-09-05 DIAGNOSIS — G4733 Obstructive sleep apnea (adult) (pediatric): Secondary | ICD-10-CM | POA: Diagnosis not present

## 2020-09-11 ENCOUNTER — Other Ambulatory Visit: Payer: Self-pay | Admitting: Adult Health

## 2020-09-27 DIAGNOSIS — G4733 Obstructive sleep apnea (adult) (pediatric): Secondary | ICD-10-CM | POA: Diagnosis not present

## 2020-09-30 ENCOUNTER — Other Ambulatory Visit: Payer: Self-pay | Admitting: Internal Medicine

## 2020-09-30 DIAGNOSIS — I10 Essential (primary) hypertension: Secondary | ICD-10-CM

## 2020-10-02 DIAGNOSIS — M19011 Primary osteoarthritis, right shoulder: Secondary | ICD-10-CM | POA: Diagnosis not present

## 2020-10-02 DIAGNOSIS — M542 Cervicalgia: Secondary | ICD-10-CM | POA: Diagnosis not present

## 2020-10-02 DIAGNOSIS — M25511 Pain in right shoulder: Secondary | ICD-10-CM | POA: Diagnosis not present

## 2020-10-02 DIAGNOSIS — R229 Localized swelling, mass and lump, unspecified: Secondary | ICD-10-CM | POA: Diagnosis not present

## 2020-10-02 DIAGNOSIS — M7541 Impingement syndrome of right shoulder: Secondary | ICD-10-CM | POA: Diagnosis not present

## 2020-10-05 DIAGNOSIS — G4733 Obstructive sleep apnea (adult) (pediatric): Secondary | ICD-10-CM | POA: Diagnosis not present

## 2020-10-16 ENCOUNTER — Ambulatory Visit: Payer: Medicare HMO | Admitting: Internal Medicine

## 2020-10-22 ENCOUNTER — Ambulatory Visit: Payer: Medicare HMO | Admitting: Internal Medicine

## 2020-10-25 DIAGNOSIS — M65331 Trigger finger, right middle finger: Secondary | ICD-10-CM | POA: Diagnosis not present

## 2020-10-25 DIAGNOSIS — M79641 Pain in right hand: Secondary | ICD-10-CM | POA: Diagnosis not present

## 2020-10-25 DIAGNOSIS — M79642 Pain in left hand: Secondary | ICD-10-CM | POA: Diagnosis not present

## 2020-11-05 DIAGNOSIS — G4733 Obstructive sleep apnea (adult) (pediatric): Secondary | ICD-10-CM | POA: Diagnosis not present

## 2020-11-13 ENCOUNTER — Ambulatory Visit (INDEPENDENT_AMBULATORY_CARE_PROVIDER_SITE_OTHER): Payer: Medicare HMO | Admitting: Internal Medicine

## 2020-11-13 ENCOUNTER — Encounter: Payer: Self-pay | Admitting: Internal Medicine

## 2020-11-13 ENCOUNTER — Other Ambulatory Visit: Payer: Self-pay

## 2020-11-13 VITALS — BP 130/70 | HR 56 | Temp 97.2°F | Resp 16 | Ht 69.5 in | Wt 245.2 lb

## 2020-11-13 DIAGNOSIS — N401 Enlarged prostate with lower urinary tract symptoms: Secondary | ICD-10-CM | POA: Diagnosis not present

## 2020-11-13 DIAGNOSIS — E8881 Metabolic syndrome: Secondary | ICD-10-CM

## 2020-11-13 DIAGNOSIS — I10 Essential (primary) hypertension: Secondary | ICD-10-CM

## 2020-11-13 DIAGNOSIS — N138 Other obstructive and reflux uropathy: Secondary | ICD-10-CM

## 2020-11-13 DIAGNOSIS — I700 Atherosclerosis of aorta: Secondary | ICD-10-CM | POA: Diagnosis not present

## 2020-11-13 DIAGNOSIS — E559 Vitamin D deficiency, unspecified: Secondary | ICD-10-CM | POA: Diagnosis not present

## 2020-11-13 DIAGNOSIS — Z79899 Other long term (current) drug therapy: Secondary | ICD-10-CM

## 2020-11-13 DIAGNOSIS — E782 Mixed hyperlipidemia: Secondary | ICD-10-CM | POA: Diagnosis not present

## 2020-11-13 DIAGNOSIS — E88819 Insulin resistance, unspecified: Secondary | ICD-10-CM | POA: Insufficient documentation

## 2020-11-13 MED ORDER — TAMSULOSIN HCL 0.4 MG PO CAPS: ORAL_CAPSULE | ORAL | 3 refills | Status: DC

## 2020-11-13 MED ORDER — DEXAMETHASONE 4 MG PO TABS: ORAL_TABLET | ORAL | 0 refills | Status: DC

## 2020-11-13 NOTE — Progress Notes (Signed)
/       History of Present Illness:       This very nice 65 y.o.  MWM presents for 6 month follow up with HTN, HLD, Pre-Diabetes and Vitamin D Deficiency. Chest CT scan on 08/09/2019 showed Aortic Atherosclerosis. Patient does have OSA and is on CPAP (Dr Rexene Alberts).       Patient is followed  with  labile HTN (2015)  & is on Zebeta & BP has been controlled at home. Today's BP is at goal - 130/70. Patient has had no complaints of any cardiac type chest pain, palpitations, dyspnea / orthopnea / PND, dizziness, claudication, or dependent edema.      Hyperlipidemia is controlled with diet & meds. Patient denies myalgias or other med SE's. Last Lipids were at goal:  Lab Results  Component Value Date   CHOL 144 07/18/2020   HDL 44 07/18/2020   LDLCALC 81 07/18/2020   TRIG 92 07/18/2020   CHOLHDL 3.3 07/18/2020    Also, the patient has history of  Morbid Obesity (BMI 37+z0 and PreDiabetes (2016) and then was started on Metformin in 2020 for Insulin Resistance at the [facility]. Patient  has had no symptoms of reactive hypoglycemia, diabetic polys, paresthesias or visual blurring.  Last A1c was not at goal:  Lab Results  Component Value Date   HGBA1C 6.0 (H) 11/28/2019          Further, the patient also has history of Vitamin D Deficiency ("21" /2016) and supplements vitamin D without any suspected side-effects. Last vitamin D was at goal:  Lab Results  Component Value Date   VD25OH 88 11/28/2019    Current Outpatient Medications on File Prior to Visit  Medication Sig  . albuterol (VENTOLIN HFA) 108 (90 Base) MCG/ACT inhaler USE 2 INHALATIONS 15-20 MINUTES APART EVERY 4 HOURS AS NEEDED TO RESCUE ASTHMA  . ALPRAZolam (XANAX) 0.5 MG tablet Take 1/2-1 tablet 2 - 3 x /day ONLY if needed for Anxiety Attack &  limit to 5 days /week to avoid addiction  . bisoprolol (ZEBETA) 10 MG tablet Take 1 tablet by mouth once daily for blood pressure  . fenofibrate micronized (LOFIBRA) 134  MG capsule Take     1 capsule      Daily      for Triglycerides (Blood Fats)  . fexofenadine (ALLEGRA) 180 MG tablet Take 1 tablet  daily.  . finasteride (PROSCAR) 5 MG tablet Take 1 tablet by mouth once daily  . meloxicam15 MG tablet Take      1/2 to 1 tablet        Daily       with Food for Pain & Inflammation  . montelukast (SINGULAIR) 10 MG tablet Take     1 tablet      Daily     for Allergies  . nystatin (MYCOSTATIN) 100000 UNIT/ML suspension 5 ml four times a day, retain in mouth as long as possible (Swish and Swallow).  Use for 48 hours after symptoms resolve.  Marland Kitchen omeprazole (PRILOSEC) 20 MG capsule Take 1 capsule Daily for Indigestion & Heartburn  . phentermine (ADIPEX-P) 37.5 MG tablet TAKE 1 TABLET EVERY MORNING FOR DIETING & WEIGHT LOSS  . valsartan-hydrochlorothiazide (DIOVAN-HCT) 80-12.5 MG tablet Take 1 tablet Daily for BP (Patient taking differently: Take 1/2 tablet Daily for BP)   No current facility-administered medications on file prior to visit.    Allergies  Allergen Reactions  . Bee Venom Anaphylaxis  . Pine Anaphylaxis  .  Lisinopril Other (See Comments)    Patient intolerant of drug which caused his tongue to tingle.  . Celebrex [Celecoxib]     PMHx:   Past Medical History:  Diagnosis Date  . Arthritis   . Asthma   . Back pain   . Chronic pain of both shoulders   . Depression   . Dyspnea   . Elevated glucose   . Environmental allergies   . GERD (gastroesophageal reflux disease)   . HLD (hyperlipidemia)   . HTN (hypertension)   . Insomnia   . Obesity   . OSA on CPAP 08/01/2014  . Reflux   . Trigger finger     Immunization History  Administered Date(s) Administered  . Influenza Inj Mdck Quad With Preservative 08/19/2017, 07/25/2019  . Influenza Split 08/06/2015  . Influenza, High Dose Seasonal PF 08/07/2020  . Influenza,inj,quad, With Preservative 08/20/2016, 07/20/2019  . Moderna Sars-Covid-2 Vaccination 01/04/2020, 01/25/2020  . PFIZER(Purple  Top)SARS-COV-2 Vaccination 08/27/2020  . PPD Test 06/03/2018, 11/28/2019  . Pneumococcal Polysaccharide-23 08/20/2016  . Tdap 11/06/2016  . Zoster 11/06/2016    Past Surgical History:  Procedure Laterality Date  . APPENDECTOMY  07/2012  . HEMORRHOID SURGERY  09/2012  . HERNIA REPAIR      FHx:    Reviewed / unchanged  SHx:    Reviewed / unchanged   Systems Review:  Constitutional: Denies fever, chills, wt changes, headaches, insomnia, fatigue, night sweats, change in appetite. Eyes: Denies redness, blurred vision, diplopia, discharge, itchy, watery eyes.  ENT: Denies discharge, congestion, post nasal drip, epistaxis, sore throat, earache, hearing loss, dental pain, tinnitus, vertigo, sinus pain, snoring.  CV: Denies chest pain, palpitations, irregular heartbeat, syncope, dyspnea, diaphoresis, orthopnea, PND, claudication or edema. Respiratory: denies cough, dyspnea, DOE, pleurisy, hoarseness, laryngitis, wheezing.  Gastrointestinal: Denies dysphagia, odynophagia, heartburn, reflux, water brash, abdominal pain or cramps, nausea, vomiting, bloating, diarrhea, constipation, hematemesis, melena, hematochezia  or hemorrhoids. Genitourinary: Denies dysuria, frequency, urgency, nocturia, hesitancy, discharge, hematuria or flank pain. Musculoskeletal: Denies arthralgias, myalgias, stiffness, jt. swelling, pain, limping or strain/sprain.  Skin: Denies pruritus, rash, hives, warts, acne, eczema or change in skin lesion(s). Neuro: No weakness, tremor, incoordination, spasms, paresthesia or pain. Psychiatric: Denies confusion, memory loss or sensory loss. Endo: Denies change in weight, skin or hair change.  Heme/Lymph: No excessive bleeding, bruising or enlarged lymph nodes.  Physical Exam  BP 130/70   Pulse (!) 56   Temp (!) 97.2 F (36.2 C)   Resp 16   Ht 5' 9.5" (1.765 m)   Wt 245 lb 3.2 oz (111.2 kg)   SpO2 96%   BMI 35.69 kg/m   Appears  over nourished, well groomed  and in no  distress.  Eyes: PERRLA, EOMs, conjunctiva no swelling or erythema. Sinuses: No frontal/maxillary tenderness ENT/Mouth: EAC's clear, TM's nl w/o erythema, bulging. Nares clear w/o erythema, swelling, exudates. Oropharynx clear without erythema or exudates. Oral hygiene is good. Tongue normal, non obstructing. Hearing intact.  Neck: Supple. Thyroid not palpable. Car 2+/2+ without bruits, nodes or JVD. Chest: Respirations nl with BS clear & equal w/o rales, rhonchi, wheezing or stridor.  Cor: Heart sounds normal w/ regular rate and rhythm without sig. murmurs, gallops, clicks or rubs. Peripheral pulses normal and equal  without edema.  Abdomen: Soft & bowel sounds normal. Non-tender w/o guarding, rebound, hernias, masses or organomegaly.  Lymphatics: Unremarkable.  Musculoskeletal: Full ROM all peripheral extremities, joint stability, 5/5 strength and normal gait.  Skin: Warm, dry without exposed rashes, lesions or  ecchymosis apparent.  Neuro: Cranial nerves intact, reflexes equal bilaterally. Sensory-motor testing grossly intact. Tendon reflexes grossly intact.  Pysch: Alert & oriented x 3.  Insight and judgement nl & appropriate. No ideations.  Assessment and Plan:  1. Essential hypertension  - Continue medication, monitor blood pressure at home.  - Continue DASH diet.  Reminder to go to the ER if any CP,  SOB, nausea, dizziness, severe HA, changes vision/speech.  - CBC with Differential/Platelet - COMPLETE METABOLIC PANEL WITH GFR - Magnesium - TSH  2. Insulin resistance  - Continue diet/meds, exercise,& lifestyle modifications.  - Continue monitor periodic cholesterol/liver & renal functions   - Hemoglobin A1c - Insulin, random  3. Hyperlipidemia, mixed  - Continue diet, exercise  - Lifestyle modifications.  - Monitor appropriate labs.  - Lipid panel - TSH  4. Vitamin D deficiency  - Continue supplementation.  - VITAMIN D 25 Hydroxyl  5. Thoracic aortic  atherosclerosis ([place]) by Chest ct SCAN 0N 08/09/2019   6. BPH with obstruction/lower urinary tract symptoms  - tamsulosin (FLOMAX) 0.4 MG CAPS capsule; Take  1 tablet  at Bedtime   for Prostate  Dispense: 90 capsule; Refill: 3  7. Medication management  - CBC with Differential/Platelet - COMPLETE METABOLIC PANEL WITH GFR - Magnesium - Lipid panel - TSH - Hemoglobin A1c - Insulin, random - VITAMIN D 25 Hydroxy  - dexamethasone (DECADRON) 4 MG tablet; Taper -   Disp: 13 tablet        Discussed  regular exercise, BP monitoring, weight control to achieve/maintain BMI less than 25 and discussed med and SE's. Recommended labs to assess and monitor clinical status with further disposition pending results of labs.  I discussed the assessment and treatment plan with the patient. The patient was provided an opportunity to ask questions and all were answered. The patient agreed with the plan and demonstrated an understanding of the instructions.  I provided over 30 minutes of exam, counseling, chart review and  complex critical decision making.         The patient was advised to call back or seek an in-person evaluation if the symptoms worsen or if the condition fails to improve as anticipated.   Kirtland Bouchard, MD

## 2020-11-13 NOTE — Patient Instructions (Signed)

## 2020-11-14 LAB — CBC WITH DIFFERENTIAL/PLATELET
Absolute Monocytes: 668 cells/uL (ref 200–950)
Basophils Absolute: 38 cells/uL (ref 0–200)
Basophils Relative: 0.5 %
Eosinophils Absolute: 60 cells/uL (ref 15–500)
Eosinophils Relative: 0.8 %
HCT: 45.4 % (ref 38.5–50.0)
Hemoglobin: 15.8 g/dL (ref 13.2–17.1)
Lymphs Abs: 2138 cells/uL (ref 850–3900)
MCH: 32 pg (ref 27.0–33.0)
MCHC: 34.8 g/dL (ref 32.0–36.0)
MCV: 92.1 fL (ref 80.0–100.0)
MPV: 10.5 fL (ref 7.5–12.5)
Monocytes Relative: 8.9 %
Neutro Abs: 4598 cells/uL (ref 1500–7800)
Neutrophils Relative %: 61.3 %
Platelets: 197 10*3/uL (ref 140–400)
RBC: 4.93 10*6/uL (ref 4.20–5.80)
RDW: 12 % (ref 11.0–15.0)
Total Lymphocyte: 28.5 %
WBC: 7.5 10*3/uL (ref 3.8–10.8)

## 2020-11-14 LAB — HEMOGLOBIN A1C
Hgb A1c MFr Bld: 6.2 % of total Hgb — ABNORMAL HIGH (ref <5.7)
Mean Plasma Glucose: 131 mg/dL
eAG (mmol/L): 7.3 mmol/L

## 2020-11-14 LAB — COMPLETE METABOLIC PANEL WITH GFR
AG Ratio: 1.8 (calc) (ref 1.0–2.5)
ALT: 34 U/L (ref 9–46)
AST: 20 U/L (ref 10–35)
Albumin: 4.6 g/dL (ref 3.6–5.1)
Alkaline phosphatase (APISO): 80 U/L (ref 35–144)
BUN: 21 mg/dL (ref 7–25)
CO2: 31 mmol/L (ref 20–32)
Calcium: 9.5 mg/dL (ref 8.6–10.3)
Chloride: 106 mmol/L (ref 98–110)
Creat: 0.98 mg/dL (ref 0.70–1.25)
GFR, Est African American: 93 mL/min/{1.73_m2} (ref >=60)
GFR, Est Non African American: 81 mL/min/{1.73_m2} (ref >=60)
Globulin: 2.6 g/dL (calc) (ref 1.9–3.7)
Glucose, Bld: 100 mg/dL — ABNORMAL HIGH (ref 65–99)
Potassium: 4.1 mmol/L (ref 3.5–5.3)
Sodium: 142 mmol/L (ref 135–146)
Total Bilirubin: 0.6 mg/dL (ref 0.2–1.2)
Total Protein: 7.2 g/dL (ref 6.1–8.1)

## 2020-11-14 LAB — TSH: TSH: 1.74 mIU/L (ref 0.40–4.50)

## 2020-11-14 LAB — MAGNESIUM: Magnesium: 2 mg/dL (ref 1.5–2.5)

## 2020-11-14 LAB — INSULIN, RANDOM: Insulin: 38.6 u[IU]/mL — ABNORMAL HIGH

## 2020-11-14 LAB — LIPID PANEL
Cholesterol: 151 mg/dL (ref <200)
HDL: 32 mg/dL — ABNORMAL LOW (ref >=40)
LDL Cholesterol (Calc): 88 mg/dL (calc)
Non-HDL Cholesterol (Calc): 119 mg/dL (calc) (ref <130)
Total CHOL/HDL Ratio: 4.7 (calc) (ref <5.0)
Triglycerides: 225 mg/dL — ABNORMAL HIGH (ref <150)

## 2020-11-14 LAB — VITAMIN D 25 HYDROXY (VIT D DEFICIENCY, FRACTURES): Vit D, 25-Hydroxy: 41 ng/mL (ref 30–100)

## 2020-11-14 NOTE — Progress Notes (Signed)
========================================================== - Test results slightly outside the reference range are not unusual. If there is anything important, I will review this with you,  otherwise it is considered normal test values.  If you have further questions,  please do not hesitate to contact me at the office or via My Chart.  ========================================================== ==========================================================  -  Total Chol = 151  - and LDL Chol = 88  - Both  Excellent   - Very low risk for Heart Attack  / Stroke ========================================================  - But Triglycerides (   225  ) or fats in blood are too high  (goal is less than 150)    - Recommend avoid fried & greasy foods,  sweets / candy,   - Avoid white rice  (brown or wild rice or Quinoa is OK),   - Avoid white potatoes  (sweet potatoes are OK)   - Avoid anything made from white flour  - bagels, doughnuts, rolls, buns, biscuits, white and   wheat breads, pizza crust and traditional  pasta made of white flour & egg white  - (vegetarian pasta or spinach or wheat pasta is OK).    - Multi-grain bread is OK - like multi-grain flat bread or  sandwich thins.   - Avoid alcohol in excess.   - Exercise is also important. ========================================================== ==========================================================  - A1c ( 12 week average Blood Sugar ) = 6.2% - has gone up & is too high  (Ideal or Goal is less than 5.7%)   Being diabetic has a  300% increased risk for heart attack,  stroke, cancer, and alzheimer- type vascular dementia.   It is very important that you work harder with diet by  avoiding all foods that are white except chicken,   fish & calliflower.  - Avoid white rice  (brown & wild rice is OK),   - Avoid white potatoes  (sweet potatoes in moderation is OK),   White bread or wheat bread or anything made out of    white flour like bagels, donuts, rolls, buns, biscuits, cakes,  - pastries, cookies, pizza crust, and pasta (made from  white flour & egg whites)   - vegetarian pasta or spinach or wheat pasta is OK.  - Multigrain breads like Arnold's, Pepperidge Farm or   multigrain sandwich thins or high fiber breads like   [place] bread or "Dave's Killer" breads that are  4 to 5 grams fiber per slice !  are best.    Diet, exercise and weight loss can reverse and cure  diabetes in the early stages.    - Diet, exercise and weight loss is very important in the   control and prevention of complications of diabetes which  affects every system in your body, ie.   -Brain - dementia/stroke,  - eyes - glaucoma/blindness,  - heart - heart attack/heart failure,  - kidneys - dialysis,  - stomach - gastric paralysis,  - intestines - malabsorption,  - nerves - severe painful neuritis,  - circulation - gangrene & loss of a leg(s)  - and finally  . . . . . . . . . . . . . . . . . .    - cancer and Alzheimers. ========================================================== ==========================================================  - Vitamin D -  Low has dropped from good level of 88 down to  current level of 41                                                                                          ========================================================== (Ideal or Goal is between 70-100 A                           41   ========================================================== ==========================================================  - All Else - CBC - Kidneys - Electrolytes - Liver  &  Magnesium  - all  Normal / OK ===========================================================

## 2020-11-19 ENCOUNTER — Other Ambulatory Visit: Payer: Self-pay | Admitting: Internal Medicine

## 2020-11-21 DIAGNOSIS — E291 Testicular hypofunction: Secondary | ICD-10-CM | POA: Diagnosis not present

## 2020-11-21 DIAGNOSIS — N5201 Erectile dysfunction due to arterial insufficiency: Secondary | ICD-10-CM | POA: Diagnosis not present

## 2020-11-21 DIAGNOSIS — R948 Abnormal results of function studies of other organs and systems: Secondary | ICD-10-CM | POA: Diagnosis not present

## 2020-11-21 DIAGNOSIS — Z125 Encounter for screening for malignant neoplasm of prostate: Secondary | ICD-10-CM | POA: Diagnosis not present

## 2020-11-21 DIAGNOSIS — N3943 Post-void dribbling: Secondary | ICD-10-CM | POA: Diagnosis not present

## 2020-11-26 DIAGNOSIS — M65331 Trigger finger, right middle finger: Secondary | ICD-10-CM | POA: Diagnosis not present

## 2020-11-26 DIAGNOSIS — M25561 Pain in right knee: Secondary | ICD-10-CM | POA: Diagnosis not present

## 2020-11-26 DIAGNOSIS — M19041 Primary osteoarthritis, right hand: Secondary | ICD-10-CM | POA: Diagnosis not present

## 2020-11-27 DIAGNOSIS — D1801 Hemangioma of skin and subcutaneous tissue: Secondary | ICD-10-CM | POA: Diagnosis not present

## 2020-11-27 DIAGNOSIS — L821 Other seborrheic keratosis: Secondary | ICD-10-CM | POA: Diagnosis not present

## 2020-11-27 DIAGNOSIS — D485 Neoplasm of uncertain behavior of skin: Secondary | ICD-10-CM | POA: Diagnosis not present

## 2020-11-27 DIAGNOSIS — L718 Other rosacea: Secondary | ICD-10-CM | POA: Diagnosis not present

## 2020-11-27 DIAGNOSIS — L711 Rhinophyma: Secondary | ICD-10-CM | POA: Diagnosis not present

## 2020-11-27 DIAGNOSIS — Z85828 Personal history of other malignant neoplasm of skin: Secondary | ICD-10-CM | POA: Diagnosis not present

## 2020-11-27 DIAGNOSIS — L812 Freckles: Secondary | ICD-10-CM | POA: Diagnosis not present

## 2020-11-27 DIAGNOSIS — C44311 Basal cell carcinoma of skin of nose: Secondary | ICD-10-CM | POA: Diagnosis not present

## 2020-11-27 DIAGNOSIS — L218 Other seborrheic dermatitis: Secondary | ICD-10-CM | POA: Diagnosis not present

## 2020-11-30 ENCOUNTER — Other Ambulatory Visit: Payer: Self-pay | Admitting: Internal Medicine

## 2020-11-30 DIAGNOSIS — I10 Essential (primary) hypertension: Secondary | ICD-10-CM

## 2020-12-05 ENCOUNTER — Encounter: Payer: No Typology Code available for payment source | Admitting: Internal Medicine

## 2020-12-06 DIAGNOSIS — M19011 Primary osteoarthritis, right shoulder: Secondary | ICD-10-CM | POA: Diagnosis not present

## 2020-12-06 DIAGNOSIS — Z4789 Encounter for other orthopedic aftercare: Secondary | ICD-10-CM | POA: Diagnosis not present

## 2020-12-06 DIAGNOSIS — X58XXXA Exposure to other specified factors, initial encounter: Secondary | ICD-10-CM | POA: Diagnosis not present

## 2020-12-06 DIAGNOSIS — Y999 Unspecified external cause status: Secondary | ICD-10-CM | POA: Diagnosis not present

## 2020-12-06 DIAGNOSIS — G8918 Other acute postprocedural pain: Secondary | ICD-10-CM | POA: Diagnosis not present

## 2020-12-06 DIAGNOSIS — M7541 Impingement syndrome of right shoulder: Secondary | ICD-10-CM | POA: Diagnosis not present

## 2020-12-06 DIAGNOSIS — S43431A Superior glenoid labrum lesion of right shoulder, initial encounter: Secondary | ICD-10-CM | POA: Diagnosis not present

## 2020-12-06 DIAGNOSIS — G4733 Obstructive sleep apnea (adult) (pediatric): Secondary | ICD-10-CM | POA: Diagnosis not present

## 2020-12-06 DIAGNOSIS — M7542 Impingement syndrome of left shoulder: Secondary | ICD-10-CM | POA: Diagnosis not present

## 2020-12-16 DIAGNOSIS — M25511 Pain in right shoulder: Secondary | ICD-10-CM | POA: Diagnosis not present

## 2020-12-16 DIAGNOSIS — M25611 Stiffness of right shoulder, not elsewhere classified: Secondary | ICD-10-CM | POA: Diagnosis not present

## 2020-12-20 DIAGNOSIS — M25611 Stiffness of right shoulder, not elsewhere classified: Secondary | ICD-10-CM | POA: Diagnosis not present

## 2020-12-20 DIAGNOSIS — M25511 Pain in right shoulder: Secondary | ICD-10-CM | POA: Diagnosis not present

## 2020-12-24 DIAGNOSIS — M25611 Stiffness of right shoulder, not elsewhere classified: Secondary | ICD-10-CM | POA: Diagnosis not present

## 2020-12-24 DIAGNOSIS — M25511 Pain in right shoulder: Secondary | ICD-10-CM | POA: Diagnosis not present

## 2020-12-26 DIAGNOSIS — M25511 Pain in right shoulder: Secondary | ICD-10-CM | POA: Diagnosis not present

## 2020-12-26 DIAGNOSIS — M25611 Stiffness of right shoulder, not elsewhere classified: Secondary | ICD-10-CM | POA: Diagnosis not present

## 2020-12-30 DIAGNOSIS — C44311 Basal cell carcinoma of skin of nose: Secondary | ICD-10-CM | POA: Diagnosis not present

## 2021-01-01 DIAGNOSIS — M25611 Stiffness of right shoulder, not elsewhere classified: Secondary | ICD-10-CM | POA: Diagnosis not present

## 2021-01-01 DIAGNOSIS — M25511 Pain in right shoulder: Secondary | ICD-10-CM | POA: Diagnosis not present

## 2021-01-02 DIAGNOSIS — M25511 Pain in right shoulder: Secondary | ICD-10-CM | POA: Diagnosis not present

## 2021-01-02 DIAGNOSIS — M25611 Stiffness of right shoulder, not elsewhere classified: Secondary | ICD-10-CM | POA: Diagnosis not present

## 2021-01-04 ENCOUNTER — Other Ambulatory Visit: Payer: Self-pay | Admitting: Adult Health

## 2021-01-04 DIAGNOSIS — I10 Essential (primary) hypertension: Secondary | ICD-10-CM

## 2021-01-07 DIAGNOSIS — G4733 Obstructive sleep apnea (adult) (pediatric): Secondary | ICD-10-CM | POA: Diagnosis not present

## 2021-01-07 DIAGNOSIS — M25611 Stiffness of right shoulder, not elsewhere classified: Secondary | ICD-10-CM | POA: Diagnosis not present

## 2021-01-07 DIAGNOSIS — M25511 Pain in right shoulder: Secondary | ICD-10-CM | POA: Diagnosis not present

## 2021-01-10 DIAGNOSIS — M25611 Stiffness of right shoulder, not elsewhere classified: Secondary | ICD-10-CM | POA: Diagnosis not present

## 2021-01-10 DIAGNOSIS — M25511 Pain in right shoulder: Secondary | ICD-10-CM | POA: Diagnosis not present

## 2021-01-16 DIAGNOSIS — H04123 Dry eye syndrome of bilateral lacrimal glands: Secondary | ICD-10-CM | POA: Diagnosis not present

## 2021-01-16 DIAGNOSIS — B300 Keratoconjunctivitis due to adenovirus: Secondary | ICD-10-CM | POA: Diagnosis not present

## 2021-01-24 DIAGNOSIS — M19041 Primary osteoarthritis, right hand: Secondary | ICD-10-CM | POA: Diagnosis not present

## 2021-01-29 ENCOUNTER — Encounter: Payer: Medicare HMO | Admitting: Adult Health

## 2021-02-16 NOTE — Patient Instructions (Signed)

## 2021-02-16 NOTE — Progress Notes (Signed)
Annual  Screening/Preventative Visit  & Comprehensive Evaluation & Examination   Future Appointments  Date Time Provider [place]  02/17/2021 11:00 AM Unk Pinto, MD [facility] None  06/03/2021  1:00 PM Ward Givens, NP [facility] None  07/08/2021  4:00 PM Liane Comber, NP [facility] None  02/18/2022  3:00 PM Unk Pinto, MD [facility] None                                               This very nice 65 y.o. MWM presents for a Screening /Preventative Visit & comprehensive evaluation and management of multiple medical co-morbidities.  Patient has been followed for HTN, HLD, Prediabetes and Vitamin D Deficiency. Patient has Thoracic Aortic Atherosclerosis by CT scan on Oct 21/2020. Patient has OSA on Bipap with improved sleep hygiene (Dr Radene Gunning) .  Patient has GERD controlled on his PPI.  Patient has ongoing c/o pains in both hands and desired something for pain aa s      HTN predates circa 2015. Patient's BP has been controlled at home.  Today's BP is at goal - 122/64. Patient denies any cardiac symptoms as chest pain, palpitations, shortness of breath, dizziness or ankle swelling.      Patient's hyperlipidemia is controlled with diet and medications. Patient denies myalgias or other medication SE's. Last lipids were at goal except elevated Trig's:  Lab Results  Component Value Date   CHOL 151 11/13/2020   HDL 32 (L) 11/13/2020   LDLCALC 88 11/13/2020   TRIG 225 (H) 11/13/2020   CHOLHDL 4.7 11/13/2020        Patient has hx/o Morbid Obesity (BMI 36.7+) and consequent prediabetes /Insulin Resistance and patient denies reactive hypoglycemic symptoms, visual blurring, diabetic polys or paresthesias. Patient  is still struggling with weight.  Last A1c was not at goal:  Lab Results  Component Value Date   HGBA1C 6.2 (H) 11/13/2020         Finally, patient has history of Vitamin D Deficiency ("21" /2016) and last vitamin D was low (goal  (70-100):  Lab Results  Component Value Date   VD25OH 41 11/13/2020    Current Outpatient Medications on File Prior to Visit  Medication Sig  . albuterol (VENTOLIN HFA) 108 (90 Base) MCG/ACT inhaler USE 2 INHALATIONS 15-20 MINUTES APART EVERY 4 HOURS AS NEEDED TO RESCUE ASTHMA  . bisoprolol (ZEBETA) 10 MG tablet Take 1 tablet  daily for blood pressure  . fenofibrate micronized (LOFIBRA) 134 MG capsule Take  1 capsule  Daily  for Triglycerides ( Blood Fats )  . fexofenadine (ALLEGRA) 180 MG tablet Take 1 tabletdaily.  . finasteride (PROSCAR) 5 MG tablet Take 1 tablet  daily  . meloxicam (MOBIC) 15 MG tablet Take      1/2 to 1 tablet        Daily       with Food for Pain & Inflammation  . montelukast (SINGULAIR) 10 MG tablet Take  1 tablet  Daily  for Allergies  . omeprazole (PRILOSEC) 20 MG capsule Take  1 capsule  Daily  to Prevent Heartburn & Indigestion  . phentermine (ADIPEX-P) 37.5 MG tablet TAKE 1 TABLET EVERY MORNING FOR DIETING & WEIGHT LOSS  . tamsulosin (FLOMAX) 0.4 MG CAPS capsule Take  1 tablet  at Bedtime   for Prostate  . valsartan-hctz  80-12.5 MG tablet Take 1  table  Daily  for BP     Allergies  Allergen Reactions  . Bee Venom Anaphylaxis  . Pine Anaphylaxis  . Lisinopril Other (See Comments)    Patient intolerant of drug which caused his tongue to tingle.  . Celebrex [Celecoxib]     Past Medical History:  Diagnosis Date  . Arthritis   . Asthma   . Back pain   . Chronic pain of both shoulders   . Depression   . Dyspnea   . Elevated glucose   . Environmental allergies   . GERD (gastroesophageal reflux disease)   . HLD (hyperlipidemia)   . HTN (hypertension)   . Insomnia   . Obesity   . OSA on CPAP 08/01/2014  . Reflux   . Trigger finger     Health Maintenance  Topic Date Due  . Hepatitis C Screening  Never done  . PNA vac Low Risk Adult (1 of 2 - PCV13) 01/19/2020  . COLONOSCOPY 02/13/2020  . INFLUENZA VACCINE  05/19/2021  . TETANUS/TDAP   11/06/2026  . COVID-19 Vaccine  Completed  . HPV VACCINES  Aged Out    Immunization History  Administered Date(s) Administered  . Influenza Inj Mdck Quad 08/19/2017, 07/25/2019  . Influenza Split 08/06/2015  . Influenza, High Dose Seasonal PF 08/07/2020  . Influenza,inj,quad 08/20/2016, 07/20/2019  . Moderna Sars-Covid-2 Vacc 01/04/2020, 01/25/2020  . PFIZER  SARS-COV-2 Vacci 08/27/2020  . PPD Test 06/03/2018, 11/28/2019  . Pneumococcal Polysaccharide-23 08/20/2016  . Tdap 11/06/2016  . Zoster 11/06/2016    Last Colon -   Past Surgical History:  Procedure Laterality Date  . APPENDECTOMY  07/2012  . HEMORRHOID SURGERY  09/2012  . HERNIA REPAIR      Family History  Problem Relation Age of Onset  . Heart disease Mother   . Macular degeneration Mother   . Hypertension Mother   . Hyperlipidemia Mother   . Depression Mother   . Anxiety disorder Mother   . Cancer - Colon Other   . Cancer - Prostate Other   . Liver disease Father   . Alcoholism Father   . Stroke Maternal Aunt   . Cancer - Colon Paternal Uncle   . Cancer - Prostate Paternal Uncle     Social History   Socioeconomic History  . Marital status: Married    Spouse name: [name]  . Number of children: 4  . Years of education: college  Occupational History  . Occupation: Medical illustrator    Comment: [employer]  Tobacco Use  . Smoking status: Former Smoker    Packs/day: 1.00    Years: 40.00    Pack years: 40.00    Quit date: 12/03/2012    Years since quitting: 8.2  . Smokeless tobacco: Never Used  Substance and Sexual Activity  . Alcohol use: Yes    Alcohol/week: 2.0 standard drinks    Types: 2 Standard drinks or equivalent per week    Comment: occ  . Drug use: No  . Sexual activity: Not on file  Other Topics Concern  . Not on file  Social History Narrative   Right handed, caffeine 3-4 cups daily, Married, 4 kids, 9 g kids., FT insurance agent (self employed). 4 yrs college    ROS Constitutional: Denies fever, chills, weight loss/gain, headaches, insomnia,  night sweats or change in appetite. Does c/o fatigue. Eyes: Denies redness, blurred vision, diplopia, discharge, itchy or watery eyes.  ENT: Denies discharge, congestion, post nasal drip, epistaxis, sore throat,  earache, hearing loss, dental pain, Tinnitus, Vertigo, Sinus pain or snoring.  Cardio: Denies chest pain, palpitations, irregular heartbeat, syncope, dyspnea, diaphoresis, orthopnea, PND, claudication or edema Respiratory: denies cough, dyspnea, DOE, pleurisy, hoarseness, laryngitis or wheezing.  Gastrointestinal: Denies dysphagia, heartburn, reflux, water brash, pain, cramps, nausea, vomiting, bloating, diarrhea, constipation, hematemesis, melena, hematochezia, jaundice or hemorrhoids Genitourinary: Denies dysuria, frequency, urgency, nocturia, hesitancy, discharge, hematuria or flank pain. Has urgency, nocturia x 2-3 & occasional hesitancy.  Musculoskeletal: Denies arthralgia, myalgia, stiffness, Jt. Swelling, pain, limp or strain/sprain. Denies Falls. Skin: Denies puritis, rash, hives, warts, acne, eczema or change in skin lesion Neuro: No weakness, tremor, incoordination, spasms, paresthesia or pain Psychiatric: Denies confusion, memory loss or sensory loss. Denies Depression. Endocrine: Denies change in weight, skin, hair change, nocturia, and paresthesia, diabetic polys, visual blurring or hyper / hypo glycemic episodes.  Heme/Lymph: No excessive bleeding, bruising or enlarged lymph nodes.  Physical Exam  BP 122/64   Pulse (!) 56   Temp 97.7 F (36.5 C)   Resp 16   Ht 5\' 9"  (1.753 m)   Wt 248 lb 12.8 oz (112.9 kg)   SpO2 98%   BMI 36.74 kg/m   General Appearance: over nourished and well groomed and in no apparent distress.  Eyes: PERRLA, EOMs, conjunctiva no swelling or erythema, normal fundi and vessels. Sinuses: No frontal/maxillary tenderness ENT/Mouth: EACs patent / TMs  nl. Nares  clear without erythema, swelling, mucoid exudates. Oral hygiene is good. No erythema, swelling, or exudate. Tongue normal, non-obstructing. Tonsils not swollen or erythematous. Hearing normal.  Neck: Supple, thyroid not palpable. No bruits, nodes or JVD. Respiratory: Respiratory effort normal.  BS equal and clear bilateral without rales, rhonci, wheezing or stridor. Cardio: Heart sounds are normal with regular rate and rhythm and no murmurs, rubs or gallops. Peripheral pulses are normal and equal bilaterally without edema. No aortic or femoral bruits. Chest: symmetric with normal excursions and percussion.  Abdomen: Soft, with Nl bowel sounds. Nontender, no guarding, rebound, hernias, masses, or organomegaly.  Lymphatics: Non tender without lymphadenopathy.  Musculoskeletal: Full ROM all peripheral extremities, joint stability, 5/5 strength, and normal gait. Skin: Warm and dry without rashes, lesions, cyanosis, clubbing or  ecchymosis.  Neuro: Cranial nerves intact, reflexes equal bilaterally. Normal muscle tone, no cerebellar symptoms. Sensation intact.  Pysch: Alert and oriented X 3 with normal affect, insight and judgment appropriate.   Assessment and Plan  1. Annual Preventative/Screening Exam    2. Essential hypertension  - EKG 12-Lead - [place], RETROPERITNL ABD,  LTD - CBC with Differential/Platelet - COMPLETE METABOLIC PANEL WITH GFR - Magnesium - TSH  3. Hyperlipidemia, mixed  - EKG 12-Lead - [place], RETROPERITNL ABD,  LTD - Lipid panel - TSH  4. Abnormal glucose  - EKG 12-Lead - [place], RETROPERITNL ABD,  LTD - Hemoglobin A1c - Insulin, random  5. Vitamin D deficiency  - VITAMIN D 25 Hydroxy  6. Insulin resistance  - EKG 12-Lead - [place], RETROPERITNL ABD,  LTD - Hemoglobin A1c - Insulin, random  7. OSA treated with BiPAP   8. BPH with obstruction/lower urinary tract symptoms  - PSA  9. Thoracic aortic atherosclerosis ([place]) by Chest ct SCAN 0N 08/09/2019  - EKG  12-Lead - [place], RETROPERITNL ABD,  LTD  10. Severe obesity (BMI 35.0-35.9 with comorbidity) (HCC)  - TSH  11. Prostate cancer screening  - PSA  12. Screening for colorectal cancer  - POC Hemoccult Bld/Stl  13. Screening for ischemic heart disease  - EKG  12-Lead  14. Screening for AAA   - [place], RETROPERITNL ABD,  LTD  15. Medication management  - Urinalysis, Routine w reflex microscopic - Microalbumin / creatinine urine ratio - CBC with Differential/Platelet - Magnesium - Lipid panel - TSH - Hemoglobin A1c - Insulin, random - VITAMIN D 25 Hydroxy         Patient was counseled in prudent diet, weight control to achieve/maintain BMI less than 25, BP monitoring, regular exercise and medications as discussed.  Discussed med effects and SE's. Routine screening labs and tests as requested with regular follow-up as recommended. Over 40 minutes of exam, counseling, chart review and high complex critical decision making was performed   Kirtland Bouchard, MD

## 2021-02-17 ENCOUNTER — Other Ambulatory Visit: Payer: Self-pay

## 2021-02-17 ENCOUNTER — Encounter: Payer: Self-pay | Admitting: Internal Medicine

## 2021-02-17 ENCOUNTER — Ambulatory Visit (INDEPENDENT_AMBULATORY_CARE_PROVIDER_SITE_OTHER): Payer: Medicare HMO | Admitting: Internal Medicine

## 2021-02-17 VITALS — BP 122/64 | HR 56 | Temp 97.7°F | Resp 16 | Ht 69.0 in | Wt 248.8 lb

## 2021-02-17 DIAGNOSIS — Z0001 Encounter for general adult medical examination with abnormal findings: Secondary | ICD-10-CM

## 2021-02-17 DIAGNOSIS — Z136 Encounter for screening for cardiovascular disorders: Secondary | ICD-10-CM | POA: Diagnosis not present

## 2021-02-17 DIAGNOSIS — I700 Atherosclerosis of aorta: Secondary | ICD-10-CM

## 2021-02-17 DIAGNOSIS — Z79899 Other long term (current) drug therapy: Secondary | ICD-10-CM | POA: Diagnosis not present

## 2021-02-17 DIAGNOSIS — I10 Essential (primary) hypertension: Secondary | ICD-10-CM

## 2021-02-17 DIAGNOSIS — Z125 Encounter for screening for malignant neoplasm of prostate: Secondary | ICD-10-CM | POA: Diagnosis not present

## 2021-02-17 DIAGNOSIS — E8881 Metabolic syndrome: Secondary | ICD-10-CM

## 2021-02-17 DIAGNOSIS — Z1211 Encounter for screening for malignant neoplasm of colon: Secondary | ICD-10-CM

## 2021-02-17 DIAGNOSIS — Z6838 Body mass index (BMI) 38.0-38.9, adult: Secondary | ICD-10-CM

## 2021-02-17 DIAGNOSIS — E782 Mixed hyperlipidemia: Secondary | ICD-10-CM

## 2021-02-17 DIAGNOSIS — N138 Other obstructive and reflux uropathy: Secondary | ICD-10-CM

## 2021-02-17 DIAGNOSIS — N401 Enlarged prostate with lower urinary tract symptoms: Secondary | ICD-10-CM | POA: Diagnosis not present

## 2021-02-17 DIAGNOSIS — R7309 Other abnormal glucose: Secondary | ICD-10-CM

## 2021-02-17 DIAGNOSIS — E559 Vitamin D deficiency, unspecified: Secondary | ICD-10-CM

## 2021-02-17 DIAGNOSIS — G4733 Obstructive sleep apnea (adult) (pediatric): Secondary | ICD-10-CM

## 2021-02-17 DIAGNOSIS — M2550 Pain in unspecified joint: Secondary | ICD-10-CM

## 2021-02-17 DIAGNOSIS — Z0000 Encounter for general adult medical examination without abnormal findings: Secondary | ICD-10-CM | POA: Diagnosis not present

## 2021-02-17 MED ORDER — DULOXETINE HCL 30 MG PO CPEP: ORAL_CAPSULE | ORAL | 1 refills | Status: DC

## 2021-02-17 MED ORDER — TOPIRAMATE 50 MG PO TABS: ORAL_TABLET | ORAL | 1 refills | Status: DC

## 2021-02-17 MED ORDER — PHENTERMINE HCL 37.5 MG PO TABS: ORAL_TABLET | ORAL | 1 refills | Status: DC

## 2021-02-17 NOTE — Progress Notes (Signed)
AortaScan < 3 cm. Within normal limits, per Dr Mckeown. 

## 2021-02-18 DIAGNOSIS — M71349 Other bursal cyst, unspecified hand: Secondary | ICD-10-CM | POA: Diagnosis not present

## 2021-02-18 DIAGNOSIS — M71341 Other bursal cyst, right hand: Secondary | ICD-10-CM | POA: Diagnosis not present

## 2021-02-18 DIAGNOSIS — M19041 Primary osteoarthritis, right hand: Secondary | ICD-10-CM | POA: Diagnosis not present

## 2021-02-18 LAB — MICROALBUMIN / CREATININE URINE RATIO
Creatinine, Urine: 69 mg/dL (ref 20–320)
Microalb, Ur: <0.2 mg/dL

## 2021-02-18 LAB — COMPLETE METABOLIC PANEL WITH GFR
AG Ratio: 1.8 (calc) (ref 1.0–2.5)
ALT: 30 U/L (ref 9–46)
AST: 23 U/L (ref 10–35)
Albumin: 4.4 g/dL (ref 3.6–5.1)
Alkaline phosphatase (APISO): 81 U/L (ref 35–144)
BUN: 19 mg/dL (ref 7–25)
CO2: 26 mmol/L (ref 20–32)
Calcium: 9.3 mg/dL (ref 8.6–10.3)
Chloride: 105 mmol/L (ref 98–110)
Creat: 0.94 mg/dL (ref 0.70–1.25)
GFR, Est African American: 98 mL/min/{1.73_m2} (ref >=60)
GFR, Est Non African American: 84 mL/min/{1.73_m2} (ref >=60)
Globulin: 2.4 g/dL (calc) (ref 1.9–3.7)
Glucose, Bld: 105 mg/dL — ABNORMAL HIGH (ref 65–99)
Potassium: 4.2 mmol/L (ref 3.5–5.3)
Sodium: 139 mmol/L (ref 135–146)
Total Bilirubin: 0.7 mg/dL (ref 0.2–1.2)
Total Protein: 6.8 g/dL (ref 6.1–8.1)

## 2021-02-18 LAB — URINALYSIS, ROUTINE W REFLEX MICROSCOPIC
Bilirubin Urine: NEGATIVE
Glucose, UA: NEGATIVE
Hgb urine dipstick: NEGATIVE
Ketones, ur: NEGATIVE
Leukocytes,Ua: NEGATIVE
Nitrite: NEGATIVE
Protein, ur: NEGATIVE
Specific Gravity, Urine: 1.014 (ref 1.001–1.035)
pH: 7 (ref 5.0–8.0)

## 2021-02-18 LAB — CBC WITH DIFFERENTIAL/PLATELET
Absolute Monocytes: 624 cells/uL (ref 200–950)
Basophils Absolute: 38 cells/uL (ref 0–200)
Basophils Relative: 0.6 %
Eosinophils Absolute: 120 cells/uL (ref 15–500)
Eosinophils Relative: 1.9 %
HCT: 44 % (ref 38.5–50.0)
Hemoglobin: 14.9 g/dL (ref 13.2–17.1)
Lymphs Abs: 1947 cells/uL (ref 850–3900)
MCH: 31.8 pg (ref 27.0–33.0)
MCHC: 33.9 g/dL (ref 32.0–36.0)
MCV: 94 fL (ref 80.0–100.0)
MPV: 10.9 fL (ref 7.5–12.5)
Monocytes Relative: 9.9 %
Neutro Abs: 3572 cells/uL (ref 1500–7800)
Neutrophils Relative %: 56.7 %
Platelets: 202 10*3/uL (ref 140–400)
RBC: 4.68 10*6/uL (ref 4.20–5.80)
RDW: 12.5 % (ref 11.0–15.0)
Total Lymphocyte: 30.9 %
WBC: 6.3 10*3/uL (ref 3.8–10.8)

## 2021-02-18 LAB — HEMOGLOBIN A1C
Hgb A1c MFr Bld: 5.9 % of total Hgb — ABNORMAL HIGH (ref <5.7)
Mean Plasma Glucose: 123 mg/dL
eAG (mmol/L): 6.8 mmol/L

## 2021-02-18 LAB — MAGNESIUM: Magnesium: 2.1 mg/dL (ref 1.5–2.5)

## 2021-02-18 LAB — LIPID PANEL
Cholesterol: 152 mg/dL (ref <200)
HDL: 31 mg/dL — ABNORMAL LOW (ref >=40)
LDL Cholesterol (Calc): 91 mg/dL (calc)
Non-HDL Cholesterol (Calc): 121 mg/dL (calc) (ref <130)
Total CHOL/HDL Ratio: 4.9 (calc) (ref <5.0)
Triglycerides: 199 mg/dL — ABNORMAL HIGH (ref <150)

## 2021-02-18 LAB — TSH: TSH: 1.47 mIU/L (ref 0.40–4.50)

## 2021-02-18 LAB — VITAMIN D 25 HYDROXY (VIT D DEFICIENCY, FRACTURES): Vit D, 25-Hydroxy: 47 ng/mL (ref 30–100)

## 2021-02-18 LAB — INSULIN, RANDOM: Insulin: 26.3 u[IU]/mL — ABNORMAL HIGH

## 2021-02-18 LAB — PSA: PSA: 0.87 ng/mL (ref <=4.00)

## 2021-02-18 NOTE — Progress Notes (Signed)
============================================================ -   Test results slightly outside the reference range are not unusual. If there is anything important, I will review this with you,  otherwise it is considered normal test values.  If you have further questions,  please do not hesitate to contact me at the office or via My Chart.  ============================================================ ============================================================  -  PSA -Low - Great  !   ============================================================ ============================================================  -  Total Chol = 152 and LDL Chol = 91 - Both  Excellent   - Very low risk for Heart Attack  / Stroke ========================================================  - And Triglycerides (   199  ) or fats in blood are too high  (goal is less than 150)    - Recommend avoid fried & greasy foods,  sweets / candy,   - Avoid white rice  (brown or wild rice or Quinoa is OK),   - Avoid white potatoes  (sweet potatoes are OK)   - Avoid anything made from white flour  - bagels, doughnuts, rolls, buns, biscuits, white and   wheat breads, pizza crust and traditional  pasta made of white flour & egg white  - (vegetarian pasta or spinach or wheat pasta is OK).    - Multi-grain bread is OK - like multi-grain flat bread or  sandwich thins.   - Avoid alcohol in excess.   - Exercise is also important. ============================================================ ============================================================  -  A1c - Much better - Down from    6.2% to 5.9% - Great  ! ============================================================ ============================================================  -  Vitamin D = 47 - is a little LOW  ============================================================ ============================================================  -  All Else - CBC - Kidneys  - Electrolytes - Liver - Magnesium & Thyroid    - all  Normal / OK ============================================================ ============================================================

## 2021-02-21 ENCOUNTER — Other Ambulatory Visit: Payer: Self-pay | Admitting: Internal Medicine

## 2021-02-26 ENCOUNTER — Other Ambulatory Visit: Payer: Self-pay | Admitting: Internal Medicine

## 2021-02-26 DIAGNOSIS — I10 Essential (primary) hypertension: Secondary | ICD-10-CM

## 2021-03-14 DIAGNOSIS — M71342 Other bursal cyst, left hand: Secondary | ICD-10-CM | POA: Diagnosis not present

## 2021-03-20 DIAGNOSIS — M71349 Other bursal cyst, unspecified hand: Secondary | ICD-10-CM | POA: Diagnosis not present

## 2021-03-20 DIAGNOSIS — M71342 Other bursal cyst, left hand: Secondary | ICD-10-CM | POA: Diagnosis not present

## 2021-03-25 DIAGNOSIS — M79645 Pain in left finger(s): Secondary | ICD-10-CM | POA: Diagnosis not present

## 2021-03-26 DIAGNOSIS — M79642 Pain in left hand: Secondary | ICD-10-CM | POA: Diagnosis not present

## 2021-03-27 DIAGNOSIS — M79642 Pain in left hand: Secondary | ICD-10-CM | POA: Diagnosis not present

## 2021-03-28 DIAGNOSIS — M79642 Pain in left hand: Secondary | ICD-10-CM | POA: Diagnosis not present

## 2021-04-01 DIAGNOSIS — M79642 Pain in left hand: Secondary | ICD-10-CM | POA: Diagnosis not present

## 2021-04-03 DIAGNOSIS — M79642 Pain in left hand: Secondary | ICD-10-CM | POA: Diagnosis not present

## 2021-04-07 ENCOUNTER — Other Ambulatory Visit: Payer: Self-pay | Admitting: Internal Medicine

## 2021-04-07 DIAGNOSIS — M79642 Pain in left hand: Secondary | ICD-10-CM | POA: Diagnosis not present

## 2021-04-07 DIAGNOSIS — G4733 Obstructive sleep apnea (adult) (pediatric): Secondary | ICD-10-CM | POA: Diagnosis not present

## 2021-04-09 DIAGNOSIS — M79642 Pain in left hand: Secondary | ICD-10-CM | POA: Diagnosis not present

## 2021-04-15 DIAGNOSIS — M79642 Pain in left hand: Secondary | ICD-10-CM | POA: Diagnosis not present

## 2021-04-22 DIAGNOSIS — M79642 Pain in left hand: Secondary | ICD-10-CM | POA: Diagnosis not present

## 2021-04-24 DIAGNOSIS — M79642 Pain in left hand: Secondary | ICD-10-CM | POA: Diagnosis not present

## 2021-04-30 DIAGNOSIS — M79645 Pain in left finger(s): Secondary | ICD-10-CM | POA: Diagnosis not present

## 2021-05-02 DIAGNOSIS — M79642 Pain in left hand: Secondary | ICD-10-CM | POA: Diagnosis not present

## 2021-05-06 DIAGNOSIS — M79642 Pain in left hand: Secondary | ICD-10-CM | POA: Diagnosis not present

## 2021-05-07 DIAGNOSIS — G4733 Obstructive sleep apnea (adult) (pediatric): Secondary | ICD-10-CM | POA: Diagnosis not present

## 2021-05-08 DIAGNOSIS — M79642 Pain in left hand: Secondary | ICD-10-CM | POA: Diagnosis not present

## 2021-05-13 DIAGNOSIS — M79642 Pain in left hand: Secondary | ICD-10-CM | POA: Diagnosis not present

## 2021-05-16 DIAGNOSIS — M79642 Pain in left hand: Secondary | ICD-10-CM | POA: Diagnosis not present

## 2021-05-18 ENCOUNTER — Ambulatory Visit
Admission: EM | Admit: 2021-05-18 | Discharge: 2021-05-18 | Disposition: A | Discharge status: Home / Self Care | Payer: Medicare HMO | Attending: Physician Assistant | Admitting: Physician Assistant

## 2021-05-18 ENCOUNTER — Encounter: Payer: Self-pay | Admitting: Emergency Medicine

## 2021-05-18 ENCOUNTER — Other Ambulatory Visit: Payer: Self-pay

## 2021-05-18 DIAGNOSIS — T63441A Toxic effect of venom of bees, accidental (unintentional), initial encounter: Secondary | ICD-10-CM

## 2021-05-18 DIAGNOSIS — T7840XA Allergy, unspecified, initial encounter: Secondary | ICD-10-CM | POA: Diagnosis not present

## 2021-05-18 MED ORDER — CETIRIZINE HCL 10 MG PO TABS: 10.0000 mg | ORAL_TABLET | Freq: Every day | ORAL | 0 refills | Status: DC

## 2021-05-18 MED ORDER — FAMOTIDINE 40 MG PO TABS
40.0000 mg | ORAL_TABLET | Freq: Once | ORAL | Status: AC
  Administered 2021-05-18: 40 mg via ORAL

## 2021-05-18 MED ORDER — DIPHENHYDRAMINE HCL 25 MG PO CAPS
25.0000 mg | ORAL_CAPSULE | Freq: Once | ORAL | Status: AC
  Administered 2021-05-18: 25 mg via ORAL

## 2021-05-18 MED ORDER — FAMOTIDINE 40 MG PO TABS: 40.0000 mg | ORAL_TABLET | Freq: Every day | ORAL | 0 refills | Status: DC

## 2021-05-18 MED ORDER — PREDNISONE 10 MG (21) PO TBPK: ORAL_TABLET | ORAL | 0 refills | Status: DC

## 2021-05-18 MED ORDER — EPINEPHRINE 0.3 MG/0.3ML IJ SOAJ: 0.3000 mg | INTRAMUSCULAR | 0 refills | Status: DC | PRN

## 2021-05-18 MED ORDER — METHYLPREDNISOLONE SODIUM SUCC 125 MG IJ SOLR
125.0000 mg | Freq: Once | INTRAMUSCULAR | Status: AC
  Administered 2021-05-18: 125 mg via INTRAMUSCULAR

## 2021-05-18 NOTE — ED Provider Notes (Signed)
[name]    CSN: [id number] Arrival date & time: 05/18/21  1010      History   Chief Complaint Chief Complaint  Patient presents with   Allergic Reaction    HPI George Montgomery is a 65 y.o. male.   Patient presents today with a several hour history of allergic reaction.  Reports that he was stung by a bee on the nose approximately 45 minutes ago.  He does have a history of allergy to bee venom and has had anaphylaxis in the past though he does not have an EpiPen.  He has developed widespread itching and swelling involving his nose and face.  He denies any shortness of breath, difficulty swallowing, changes in voice.  He did take 25 mg of Benadryl prior to coming into clinic today.  He is speaking normally and able to swallow without difficulty.   Past Medical History:  Diagnosis Date   Arthritis    Asthma    Back pain    Chronic pain of both shoulders    Depression    Dyspnea    Elevated glucose    Environmental allergies    GERD (gastroesophageal reflux disease)    HLD (hyperlipidemia)    HTN (hypertension)    Insomnia    Obesity    OSA on CPAP 08/01/2014   Reflux    Trigger finger     Patient Active Problem List   Diagnosis Date Noted   Thoracic aortic atherosclerosis ([place]) by Chest CT scan on10/21/2020. 11/13/2020   Insulin resistance 11/13/2020   Fatty liver 02/14/2020   Benign prostatic hyperplasia (BPH) with post-void dribbling 02/14/2020   Gastroesophageal reflux disease 06/02/2018   Insomnia 02/22/2018   Chronic pain of both shoulders 11/22/2017   Depression 07/20/2017   Trigger finger, right ring finger 04/07/2017   Bilateral hand pain 04/07/2017   Dyspnea 02/09/2017   Hyperlipidemia, mixed 08/06/2015   Screening for colorectal cancer 08/06/2015   Essential hypertension 11/06/2014   Cluster headaches 11/06/2014   OSA treated with BiPAP 11/06/2014   Vitamin D deficiency 11/06/2014   Testosterone deficiency 11/06/2014   Morbid  obesity ([place]) 11/06/2014    Past Surgical History:  Procedure Laterality Date   APPENDECTOMY  07/2012   bone spur removal     HEMORRHOID SURGERY  09/2012   HERNIA REPAIR         Home Medications    Prior to Admission medications   Medication Sig Start Date End Date Taking? Authorizing Provider  bisoprolol (ZEBETA) 10 MG tablet Take 1 tablet by mouth once daily for blood pressure 01/04/21  Yes McClanahan, Kyra, NP  cetirizine (ZYRTEC ALLERGY) 10 MG tablet Take 1 tablet (10 mg total) by mouth daily. 05/18/21  Yes Taunia Frasco K, PA-C  EPINEPHrine 0.3 mg/0.3 mL IJ SOAJ injection Inject 0.3 mg into the muscle as needed for anaphylaxis. 05/18/21  Yes Patriece Archbold K, PA-C  famotidine (PEPCID) 40 MG tablet Take 1 tablet (40 mg total) by mouth daily. 05/18/21  Yes Khala Tarte, Derry Skill, PA-C  fenofibrate micronized (LOFIBRA) 134 MG capsule Take  1 capsule  Daily  for Triglycerides (Blood Fats) 02/26/21  Yes Unk Pinto, MD  fexofenadine (ALLEGRA) 180 MG tablet Take 1 tablet (180 mg total) by mouth daily. 04/10/20  Yes Liane Comber, NP  meloxicam (MOBIC) 15 MG tablet Take      1/2 to 1 tablet        Daily       with Food for Pain &  Inflammation 08/12/20  Yes Unk Pinto, MD  montelukast (SINGULAIR) 10 MG tablet Take  1 tablet  Daily  for Allergies 04/07/21  Yes Unk Pinto, MD  omeprazole (PRILOSEC) 20 MG capsule TAKE 1 CAPSULE BY MOUTH ONCE DAILY TO  PREVENT  HEARTBURN  AND  INDIGESTION 02/21/21  Yes Liane Comber, NP  predniSONE (STERAPRED UNI-PAK 21 TAB) 10 MG (21) TBPK tablet As directed 05/18/21  Yes Antara Brecheisen K, PA-C  tamsulosin (FLOMAX) 0.4 MG CAPS capsule Take  1 tablet  at Bedtime   for Prostate 11/13/20  Yes Unk Pinto, MD  valsartan-hydrochlorothiazide (DIOVAN-HCT) 80-12.5 MG tablet Take 1 tablet Daily for BP 02/26/21  Yes Unk Pinto, MD  Vitamin D, Ergocalciferol, (DRISDOL) 1.25 MG (50000 UNIT) CAPS capsule Take 50,000 Units by mouth every 7 (seven) days.   Yes  [provider]  albuterol (VENTOLIN HFA) 108 (90 Base) MCG/ACT inhaler USE 2 INHALATIONS 15-20 MINUTES APART EVERY 4 HOURS AS NEEDED TO RESCUE ASTHMA 05/08/20   Liane Comber, NP  DULoxetine (CYMBALTA) 30 MG capsule Take  1 capsule  2 x /day  for Mood & Chronic Pain 02/17/21   Unk Pinto, MD  finasteride (PROSCAR) 5 MG tablet Take 1 tablet by mouth once daily Patient not taking: No sig reported 09/11/20   Liane Comber, NP  phentermine (ADIPEX-P) 37.5 MG tablet Take  1/2 to 1 tablet  every Morning  for Dieting & Weight  Loss 02/17/21   Unk Pinto, MD  topiramate (TOPAMAX) 50 MG tablet Take 1/2 to 1  tablet 2 x / day at Suppertime & Bedtime for Dieting & Weight Loss 02/17/21   Unk Pinto, MD    Family History Family History  Problem Relation Age of Onset   Heart disease Mother    Macular degeneration Mother    Hypertension Mother    Hyperlipidemia Mother    Depression Mother    Anxiety disorder Mother    Cancer - Colon Other    Cancer - Prostate Other    Liver disease Father    Alcoholism Father    Stroke Maternal Aunt    Cancer - Colon Paternal Uncle    Cancer - Prostate Paternal Uncle     Social History Social History   Tobacco Use   Smoking status: Former    Packs/day: 1.00    Years: 40.00    Pack years: 40.00    Types: Cigarettes    Quit date: 12/03/2012    Years since quitting: 8.4   Smokeless tobacco: Never  Substance Use Topics   Alcohol use: Yes    Alcohol/week: 2.0 standard drinks    Types: 2 Standard drinks or equivalent per week    Comment: occ   Drug use: No     Allergies   Bee venom, [place], Lisinopril, and Celebrex [celecoxib]   Review of Systems Review of Systems  Constitutional:  Negative for activity change, appetite change, fatigue and fever.  Respiratory:  Negative for cough, chest tightness and shortness of breath.   Cardiovascular:  Negative for chest pain.  Gastrointestinal:  Negative for abdominal pain, diarrhea,  nausea and vomiting.  Skin:  Positive for color change.  Neurological:  Negative for dizziness, light-headedness and headaches.    Physical Exam Triage Vital Signs ED Triage Vitals  Enc Vitals Group     BP      Pulse      Resp      Temp      Temp src  SpO2      Weight      Height      Head Circumference      Peak Flow      Pain Score      Pain Loc      Pain Edu?      Excl. in [place]?    No data found.  Updated Vital Signs BP 137/61 (BP Location: Left Arm)   Pulse 65   Temp 98.1 F (36.7 C) (Oral)   Resp 18   SpO2 95%   Visual Acuity Right Eye Distance:   Left Eye Distance:   Bilateral Distance:    Right Eye Near:   Left Eye Near:    Bilateral Near:     Physical Exam Vitals reviewed.  Constitutional:      General: He is awake.     Appearance: Normal appearance. He is normal weight. He is not ill-appearing.     Comments: Very pleasant male appears stated age in no acute distress sitting comfortably in exam room obviously anxious  HENT:     Head: Normocephalic and atraumatic.     Nose:     Comments: Erythema noted over nose with spread into glabella and left zygomatic arch.    Mouth/Throat:     Pharynx: Uvula midline. No oropharyngeal exudate or posterior oropharyngeal erythema.     Comments: Normal-appearing posterior oropharynx.  Patient managing secretions.  Normal phonation. Cardiovascular:     Rate and Rhythm: Normal rate and regular rhythm.     Heart sounds: Normal heart sounds, S1 normal and S2 normal. No murmur heard. Pulmonary:     Effort: Pulmonary effort is normal.     Breath sounds: Normal breath sounds. No stridor. No wheezing, rhonchi or rales.     Comments: Clear to auscultation bilaterally Abdominal:     General: Bowel sounds are normal.     Palpations: Abdomen is soft.     Tenderness: There is no abdominal tenderness.  Neurological:     Mental Status: He is alert.  Psychiatric:        Behavior: Behavior is cooperative.     UC  Treatments / Results  Labs (all labs ordered are listed, but only abnormal results are displayed) Labs Reviewed - No data to display  EKG   Radiology No results found.  Procedures Procedures (including critical care time)  Medications Ordered in UC Medications  methylPREDNISolone sodium succinate (SOLU-MEDROL) 125 mg/2 mL injection 125 mg (125 mg Intramuscular Given 05/18/21 1020)  diphenhydrAMINE (BENADRYL) capsule 25 mg (25 mg Oral Given 05/18/21 1021)  famotidine (PEPCID) tablet 40 mg (40 mg Oral Given 05/18/21 1022)    Initial Impression / Assessment and Plan / UC Course  I have reviewed the triage vital signs and the nursing notes.  Pertinent labs & imaging results that were available during my care of the patient were reviewed by me and considered in my medical decision making (see chart for details).      Initially discussed going to the emergency room given involvement of face but patient declined this today.  He was given 25 mg of Benadryl to a total dose of Benadryl 50 mg, 40 mg of Pepcid, 125 mg of Solu-Medrol.  Patient was monitored for over an hour and had improvement of symptoms and normal vital signs.  Patient was discharged on prednisone taper with instruction to begin this tomorrow.  He was instructed not to take NSAIDs with this medication due to risk of GI bleeding.  Recommend he alternate antihistamines.  Prescription for EpiPen sent to pharmacy.  Discussed that if he requires use of this medication he needs to go to the emergency room.  Discussed at length alarm symptoms that warrant emergent evaluation.  Strict return precautions given to which patient expressed understanding.  Final Clinical Impressions(s) / UC Diagnoses   Final diagnoses:  Allergic reaction, initial encounter  Bee sting, accidental or unintentional, initial encounter     Discharge Instructions      We have given you Pepcid, Benadryl, steroids today.  I do want you to alternate  antihistamines by taking Zyrtec in the morning and Pepcid at night.  Start prednisone taper tomorrow.  If you develop any worsening symptoms including difficulty breathing, shortness of breath, spread of rash you need to go to the emergency room as we discussed.     ED Prescriptions     Medication Sig Dispense Auth. Provider   predniSONE (STERAPRED UNI-PAK 21 TAB) 10 MG (21) TBPK tablet As directed 21 tablet Sunshyne Horvath K, PA-C   cetirizine (ZYRTEC ALLERGY) 10 MG tablet Take 1 tablet (10 mg total) by mouth daily. 30 tablet Nick Armel K, PA-C   famotidine (PEPCID) 40 MG tablet Take 1 tablet (40 mg total) by mouth daily. 30 tablet Giah Fickett K, PA-C   EPINEPHrine 0.3 mg/0.3 mL IJ SOAJ injection Inject 0.3 mg into the muscle as needed for anaphylaxis. 1 each Omarius Grantham, Derry Skill, PA-C      PDMP not reviewed this encounter.   Terrilee Croak, PA-C 05/18/21 1142

## 2021-05-18 NOTE — ED Triage Notes (Signed)
Bee sting to nose 45 minutes prior. Hx of anaphylaxis, almost needed intubation in the past but responded well to treatment. Took 1 25 mg Benadryl prior to arrival. Patient very anxious. Nose, area surrounding nose, and circumoral erythema present. Pt states he was feeling mild tongue swelling and throat tightness. Denies SOB. Able to manage secretions, speak, swallow medications without difficulty

## 2021-05-18 NOTE — Discharge Instructions (Addendum)
We have given you Pepcid, Benadryl, steroids today.  I do want you to alternate antihistamines by taking Zyrtec in the morning and Pepcid at night.  Start prednisone taper tomorrow.  If you develop any worsening symptoms including difficulty breathing, shortness of breath, spread of rash you need to go to the emergency room as we discussed.

## 2021-05-18 NOTE — ED Notes (Signed)
Vitals stable. Itching intensifying, erythema spreading to chest. Denies SOB, throat or oral tightness. Anxiety appears to be easing. Complaining mostly of itching now.

## 2021-05-20 DIAGNOSIS — M79642 Pain in left hand: Secondary | ICD-10-CM | POA: Diagnosis not present

## 2021-05-23 DIAGNOSIS — M79645 Pain in left finger(s): Secondary | ICD-10-CM | POA: Diagnosis not present

## 2021-05-27 DIAGNOSIS — M79642 Pain in left hand: Secondary | ICD-10-CM | POA: Diagnosis not present

## 2021-05-29 DIAGNOSIS — M79642 Pain in left hand: Secondary | ICD-10-CM | POA: Diagnosis not present

## 2021-06-03 ENCOUNTER — Telehealth: Payer: Medicare HMO | Admitting: Adult Health

## 2021-06-03 DIAGNOSIS — M79642 Pain in left hand: Secondary | ICD-10-CM | POA: Diagnosis not present

## 2021-06-06 DIAGNOSIS — M79642 Pain in left hand: Secondary | ICD-10-CM | POA: Diagnosis not present

## 2021-06-07 DIAGNOSIS — G4733 Obstructive sleep apnea (adult) (pediatric): Secondary | ICD-10-CM | POA: Diagnosis not present

## 2021-06-16 ENCOUNTER — Ambulatory Visit: Payer: Medicare HMO | Admitting: Nurse Practitioner

## 2021-06-17 DIAGNOSIS — M25642 Stiffness of left hand, not elsewhere classified: Secondary | ICD-10-CM | POA: Diagnosis not present

## 2021-06-17 DIAGNOSIS — M71342 Other bursal cyst, left hand: Secondary | ICD-10-CM | POA: Diagnosis not present

## 2021-06-19 DIAGNOSIS — L821 Other seborrheic keratosis: Secondary | ICD-10-CM | POA: Diagnosis not present

## 2021-06-19 DIAGNOSIS — L218 Other seborrheic dermatitis: Secondary | ICD-10-CM | POA: Diagnosis not present

## 2021-06-19 DIAGNOSIS — Z85828 Personal history of other malignant neoplasm of skin: Secondary | ICD-10-CM | POA: Diagnosis not present

## 2021-06-30 DIAGNOSIS — M25642 Stiffness of left hand, not elsewhere classified: Secondary | ICD-10-CM | POA: Diagnosis not present

## 2021-07-04 DIAGNOSIS — M7021 Olecranon bursitis, right elbow: Secondary | ICD-10-CM | POA: Diagnosis not present

## 2021-07-04 DIAGNOSIS — M25522 Pain in left elbow: Secondary | ICD-10-CM | POA: Diagnosis not present

## 2021-07-07 NOTE — Progress Notes (Deleted)
ANNUAL WELLNESS VISIT AND 3 MONTH FOLLOW UP  Assessment:   George Montgomery was seen today for welcome to medicare wellness and follow-up.  Diagnoses and all orders for this visit:  Encounter for Medicare Annual Wellness Exam Yearly for annual wellness  Essential hypertension Continue current medications: Monitor blood pressure at home; call if consistently over 130/80 Continue DASH diet.   Reminder to go to the ER if any CP, SOB, nausea, dizziness, severe HA, changes vision/speech, left arm numbness and tingling and jaw pain. -     CBC with Differential/Platelet -     COMPLETE METABOLIC PANEL WITH GFR  Hyperlipidemia, mixed Continue medications: fenofibrate '134mg'$  Discussed dietary and exercise modifications Low fat diet -     Lipid panel - TSH  Vitamin D deficiency Continue supplementation to maintain goal of 70-100 Taking Vitamin D IU daily Vitamin D  Testosterone deficiency No supplementation at this time.  BPH with obstruction/lower urinary tract symptoms Doing well at this time Continue medications: finasteride Will continue to monitor Defer PSA this check  Fatty liver Discussed dietary and exercise modifications  Seasonal allergic rhinitis due to pollen Stop loratadine, change to  Cetirizine, levacetirazine or fexofenadine  Medication management Magnesium  OSA treated with BiPap Reports wears nightly Discussed hygiene regarding equipment  Morbid Obesity (BMI 36) Discussed dietary and exercise modifications    Over 30 minutes of face to face interview, exam, counseling, chart review, and critical decision making was performed  Future Appointments  Date Time Provider [place]  07/08/2021  4:00 PM Magda Bernheim, NP [facility] None  08/14/2021 10:30 AM Ward Givens, NP [facility] None  10/07/2021  9:30 AM Unk Pinto, MD [facility] None  02/18/2022  3:00 PM Unk Pinto, MD [facility] None     Plan:   During the course of the visit the  patient was educated and counseled about appropriate screening and preventive services including:   Pneumococcal vaccine  Influenza vaccine Prevnar 13 Td vaccine Screening electrocardiogram Colorectal cancer screening Diabetes screening Glaucoma screening Nutrition counseling    Subjective:  George Montgomery is a 65 y.o. male who presents for Medicare Annual Wellness Visit and 3 month follow up for HTN, HLD, BPH, prediabetes, and vitamin D Def.   BMI is There is no height or weight on file to calculate BMI., he {HAS HAS [id number] been working on diet and exercise. Wt Readings from Last 3 Encounters:  02/17/21 248 lb 12.8 oz (112.9 kg)  11/13/20 245 lb 3.2 oz (111.2 kg)  07/18/20 247 lb 6.4 oz (112.2 kg)     His blood pressure has been controlled at home, today their BP is   He does workout. He denies chest pain, shortness of breath, dizziness.  He is on cholesterol medication and denies myalgias. His cholesterol is at goal. The cholesterol last visit was:   Lab Results  Component Value Date   CHOL 152 02/17/2021   HDL 31 (L) 02/17/2021   LDLCALC 91 02/17/2021   TRIG 199 (H) 02/17/2021   CHOLHDL 4.9 02/17/2021   He has been working on diet and exercise for prediabetes, and denies polydipsia, polyuria, visual disturbances, vomiting and weight loss. Last A1C in the office was:  Lab Results  Component Value Date   HGBA1C 5.9 (H) 02/17/2021   Last GFR Lab Results  Component Value Date   GFRNONAA 84 02/17/2021     Lab Results  Component Value Date   GFRAA 98 02/17/2021   Patient is on Vitamin D supplement.  Lab Results  Component Value Date   VD25OH 47 02/17/2021     BPH Currently he is symptomatic and endorses nocturia x 1 a night and denies urinary hesitancy, incomplete voiding, double voiding, weak stream, perineal discomfort, dysuria, hematuria, abdominal pain, flank pain and testicular pain.  He is taking Finasteride (Propecia).  Reports he no longer has  dribbling after he urinates since starting finasteride. He is not following with urology at this time and last results were in normal. Lab Results  Component Value Date   PSA 0.87 02/17/2021       Medication Review:  Current Outpatient Medications (Endocrine & Metabolic):    predniSONE (STERAPRED UNI-PAK 21 TAB) 10 MG (21) TBPK tablet, As directed  Current Outpatient Medications (Cardiovascular):    bisoprolol (ZEBETA) 10 MG tablet, Take 1 tablet by mouth once daily for blood pressure   EPINEPHrine 0.3 mg/0.3 mL IJ SOAJ injection, Inject 0.3 mg into the muscle as needed for anaphylaxis.   fenofibrate micronized (LOFIBRA) 134 MG capsule, Take  1 capsule  Daily  for Triglycerides (Blood Fats)   valsartan-hydrochlorothiazide (DIOVAN-HCT) 80-12.5 MG tablet, Take 1 tablet Daily for BP  Current Outpatient Medications (Respiratory):    albuterol (VENTOLIN HFA) 108 (90 Base) MCG/ACT inhaler, USE 2 INHALATIONS 15-20 MINUTES APART EVERY 4 HOURS AS NEEDED TO RESCUE ASTHMA   cetirizine (ZYRTEC ALLERGY) 10 MG tablet, Take 1 tablet (10 mg total) by mouth daily.   fexofenadine (ALLEGRA) 180 MG tablet, Take 1 tablet (180 mg total) by mouth daily.   montelukast (SINGULAIR) 10 MG tablet, Take  1 tablet  Daily  for Allergies  Current Outpatient Medications (Analgesics):    meloxicam (MOBIC) 15 MG tablet, Take      1/2 to 1 tablet        Daily       with Food for Pain & Inflammation   Current Outpatient Medications (Other):    DULoxetine (CYMBALTA) 30 MG capsule, Take  1 capsule  2 x /day  for Mood & Chronic Pain   famotidine (PEPCID) 40 MG tablet, Take 1 tablet (40 mg total) by mouth daily.   finasteride (PROSCAR) 5 MG tablet, Take 1 tablet by mouth once daily (Patient not taking: No sig reported)   omeprazole (PRILOSEC) 20 MG capsule, TAKE 1 CAPSULE BY MOUTH ONCE DAILY TO  PREVENT  HEARTBURN  AND  INDIGESTION   phentermine (ADIPEX-P) 37.5 MG tablet, Take  1/2 to 1 tablet  every Morning  for Dieting  & Weight  Loss   tamsulosin (FLOMAX) 0.4 MG CAPS capsule, Take  1 tablet  at Bedtime   for Prostate   topiramate (TOPAMAX) 50 MG tablet, Take 1/2 to 1  tablet 2 x / day at Suppertime & Bedtime for Dieting & Weight Loss   Vitamin D, Ergocalciferol, (DRISDOL) 1.25 MG (50000 UNIT) CAPS capsule, Take 50,000 Units by mouth every 7 (seven) days.  Allergies: Allergies  Allergen Reactions   Bee Venom Anaphylaxis   Pine Anaphylaxis   Lisinopril Other (See Comments)    Patient intolerant of drug which caused his tongue to tingle.   Celebrex [Celecoxib]     Current Problems (verified) has Essential hypertension; Cluster headaches; OSA treated with BiPAP; Vitamin D deficiency; Testosterone deficiency; Morbid obesity ([place]); Hyperlipidemia, mixed; Screening for colorectal cancer; Dyspnea; Trigger finger, right ring finger; Bilateral hand pain; Depression; Chronic pain of both shoulders; Insomnia; Gastroesophageal reflux disease; Fatty liver; Benign prostatic hyperplasia (BPH) with post-void dribbling; Thoracic aortic atherosclerosis ([place]) by Chest CT  scan on10/21/2020.; and Insulin resistance on their problem list.  Screening Tests Immunization History  Administered Date(s) Administered   Influenza Inj Mdck Quad With Preservative 08/19/2017, 07/25/2019   Influenza Split 08/06/2015   Influenza, High Dose Seasonal PF 08/07/2020   Influenza,inj,quad, With Preservative 08/20/2016, 07/20/2019   Moderna Sars-Covid-2 Vaccination 01/04/2020, 01/25/2020   PFIZER(Purple Top)SARS-COV-2 Vaccination 08/27/2020   PPD Test 06/03/2018, 11/28/2019   Pneumococcal Polysaccharide-23 08/20/2016   Tdap 11/06/2016   Zoster, Live 11/06/2016    Preventative care: Last colonoscopy: 2016 due 2021***  Prior vaccinations: TD or Tdap: 2018  Influenza: Due for 2021  Pneumococcal: 2017 Shingles/Zostavax: 2018  Names of Other Physician/Practitioners you currently use: 1. [facility]  here for primary care 2. [place], 04/2020 3. Dental Exam Dr Kalman Shan, Peridontal disease Q50month 03/2020 Patient Care Team: MUnk Pinto MD as PCP - General (Internal Medicine)   Surgical: He  has a past surgical history that includes Hemorrhoid surgery (09/2012); Appendectomy (07/2012); Hernia repair; and bone spur removal. Family His family history includes Alcoholism in his father; Anxiety disorder in his mother; Cancer - Colon in his paternal uncle and another family member; Cancer - Prostate in his paternal uncle and another family member; Depression in his mother; Heart disease in his mother; Hyperlipidemia in his mother; Hypertension in his mother; Liver disease in his father; Macular degeneration in his mother; Stroke in his maternal aunt. Social history  He reports that he quit smoking about 8 years ago. His smoking use included cigarettes. He has a 40.00 pack-year smoking history. He has never used smokeless tobacco. He reports current alcohol use of about 2.0 standard drinks per week. He reports that he does not use drugs.   MEDICARE WELLNESS OBJECTIVES: Physical activity:   Cardiac risk factors:   Depression/mood screen:   [facility]  Decreased Interest 0  Down, Depressed, Hopeless 0  PHQ - 2 Score 0  Altered sleeping -  Tired, decreased energy -  Change in appetite -  Feeling bad or failure about yourself  -  Trouble concentrating -  Moving slowly or fidgety/restless -  Suicidal thoughts -  PHQ-9 Score -  Difficult doing work/chores -    ADLs:  In your present state of health, do you have any difficulty performing the following activities: 07/18/2020  Hearing? N  Vision? N  Difficulty concentrating or making decisions? N  Walking or climbing stairs? N  Dressing or bathing? N  Doing errands, shopping? N  Preparing Food and eating ? N  Using the Toilet? N  In the past six months, have you accidently leaked urine? N   Do you have problems with loss of bowel control? N  Managing your Medications? N  Managing your Finances? N  Housekeeping or managing your Housekeeping? N  Some recent data might be hidden     Cognitive Testing  Alert? Yes  Normal Appearance?Yes  Oriented to person? Yes  Place? Yes   Time? Yes  Recall of three objects?  Yes  Can perform simple calculations? Yes  Displays appropriate judgment?Yes  Can read the correct time from a watch face?Yes  EOL planning:     Objective:   There were no vitals filed for this visit.  There is no height or weight on file to calculate BMI.  General appearance: alert, no distress, WD/WN, male HEENT: normocephalic, sclerae anicteric, TMs pearly, nares patent, no discharge or erythema, pharynx normal Oral cavity: MMM, no lesions  Neck: supple, no lymphadenopathy, no thyromegaly, no masses Heart: RRR, normal S1, S2, no murmurs Lungs: CTA bilaterally, no wheezes, rhonchi, or rales Abdomen: +bs, soft, non tender, non distended, no masses, no hepatomegaly, no splenomegaly. diastasis with hernia noted. Musculoskeletal: nontender, no swelling, no obvious deformity Extremities: no edema, no cyanosis, no clubbing Pulses: 2+ symmetric, upper and lower extremities, normal cap refill Neurological: alert, oriented x 3, CN2-12 intact, strength normal upper extremities and lower extremities, sensation normal throughout, DTRs 2+ throughout, no cerebellar signs, gait normal Psychiatric: normal affect, behavior normal, pleasant   Aorta scan: less than 3cm, WNL  Medicare Attestation I have personally reviewed: The patient's medical and social history Their use of alcohol, tobacco or illicit drugs Their current medications and supplements The patient's functional ability including ADLs,fall risks, home safety risks, cognitive, and hearing and visual impairment Diet and physical activities Evidence for depression or mood disorders  The patient's weight,  height, BMI, and visual acuity have been recorded in the chart.  I have made referrals, counseling, and provided education to the patient based on review of the above and I have provided the patient with a written personalized care plan for preventive services.      Garnet Sierras, NP [facility] 07/07/2021  11:55 PM

## 2021-07-08 ENCOUNTER — Ambulatory Visit: Payer: Self-pay | Admitting: Nurse Practitioner

## 2021-07-08 DIAGNOSIS — M25521 Pain in right elbow: Secondary | ICD-10-CM | POA: Diagnosis not present

## 2021-07-14 NOTE — Progress Notes (Addendum)
ANNUAL WELLNESS VISIT AND 3 MONTH FOLLOW UP  Assessment:   George Montgomery was seen today for welcome to medicare wellness and follow-up.  Diagnoses and all orders for this visit:  Encounter for Medicare Annual Wellness Exam Yearly for annual wellness  Essential hypertension Continue current medications: Monitor blood pressure at home; call if consistently over 130/80 Continue DASH diet.   Reminder to go to the ER if any CP, SOB, nausea, dizziness, severe HA, changes vision/speech, left arm numbness and tingling and jaw pain. -     CBC with Differential/Platelet -     COMPLETE METABOLIC PANEL WITH GFR  Hyperlipidemia, mixed Continue medications: fenofibrate 134mg  Discussed dietary and exercise modifications Low fat diet -     Lipid panel   Vitamin D deficiency Continue supplementation to maintain goal of 70-100 Taking Vitamin D IU daily  Testosterone deficiency No supplementation at this time.  BPH with obstruction/lower urinary tract symptoms Continues to wake up 1-3 times a night but had erectile issues when used finasteride and Tamsulosin Continue medications: Tamsulosin Will continue to monitor  Fatty liver Discussed dietary and exercise modifications  Seasonal allergic rhinitis due to pollen Continue fexofenadine  Medication management Monitor   OSA treated with BiPap Reports wears nightly Discussed hygiene regarding equipment  Morbid Obesity (BMI 36) Discussed dietary and exercise modifications  Screening for hematuria/proteinuria Microalbumin/creatinine ratio  Abnormal Glucose Continue diet and exercise A1C  Over 30 minutes of face to face interview, exam, counseling, chart review, and critical decision making was performed  Future Appointments  Date Time Provider [place]  08/14/2021 10:30 AM Ward Givens, NP [facility] None  10/07/2021  9:30 AM Unk Pinto, MD [facility] None  02/18/2022  3:00 PM Unk Pinto, MD [facility] None   07/08/2022  3:00 PM Magda Bernheim, NP [facility] None  07/15/2022 11:00 AM Magda Bernheim, NP [facility] None     Plan:   During the course of the visit the patient was educated and counseled about appropriate screening and preventive services including:   Pneumococcal vaccine  Influenza vaccine Prevnar 13 Td vaccine Screening electrocardiogram Colorectal cancer screening Diabetes screening Glaucoma screening Nutrition counseling    Subjective:  George Montgomery is a 65 y.o. male who presents for Medicare Annual Wellness Visit and 3 month follow up for HTN, HLD, BPH, prediabetes, and vitamin D Def.   BMI is Body mass index is 34.85 kg/m., he has been working on diet and exercise. nWas down 10 more pounds.  Continues to exercise and trying to eat healthy.  Wt Readings from Last 3 Encounters:  07/15/21 236 lb (107 kg)  02/17/21 248 lb 12.8 oz (112.9 kg)  11/13/20 245 lb 3.2 oz (111.2 kg)     His blood pressure has been controlled at home, today their BP is BP: 130/62 BP Readings from Last 3 Encounters:  07/15/21 130/62  05/18/21 137/61  02/17/21 122/64    He does workout. He denies chest pain, shortness of breath, dizziness.  He is on cholesterol medication and denies myalgias. His cholesterol is at goal. The cholesterol last visit was:   Lab Results  Component Value Date   CHOL 152 02/17/2021   HDL 31 (L) 02/17/2021   LDLCALC 91 02/17/2021   TRIG 199 (H) 02/17/2021   CHOLHDL 4.9 02/17/2021   He has been working on diet and exercise for prediabetes, and denies polydipsia, polyuria, visual disturbances, vomiting and weight loss. Last A1C in the office was:  Lab Results  Component Value Date  HGBA1C 5.9 (H) 02/17/2021   Last GFR Lab Results  Component Value Date   GFRNONAA 84 02/17/2021     Lab Results  Component Value Date   GFRAA 98 02/17/2021   Patient is on Vitamin D supplement.   Lab Results  Component Value Date   VD25OH 47 02/17/2021      BPH Currently he is symptomatic and endorses nocturia x 1-3 a night and denies urinary hesitancy, incomplete voiding, double voiding, weak stream, perineal discomfort, dysuria, hematuria, abdominal pain, flank pain and testicular pain.  He is taking Flomax.    He is not following with urology at this time and last results were in normal. Lab Results  Component Value Date   PSA 0.87 02/17/2021    Bone spurs removed from right shoulder in 2/22 doing well.   Bone spur removed from left index finger by [facility] 03/14/21, got infected, had wound that needed dressing throughout the summer.  Lost some bone in first digit of left hand so has some hypermobility of the joint. Pain persists in left 1st finger, splinting and doing rehab.   Bone spur on right elbow, following with [facility]. Just finished course of Keflex.     Medication Review:  Current Outpatient Medications (Endocrine & Metabolic):    predniSONE (STERAPRED UNI-PAK 21 TAB) 10 MG (21) TBPK tablet, As directed (Patient not taking: Reported on 07/15/2021)  Current Outpatient Medications (Cardiovascular):    bisoprolol (ZEBETA) 10 MG tablet, Take 1 tablet by mouth once daily for blood pressure   EPINEPHrine 0.3 mg/0.3 mL IJ SOAJ injection, Inject 0.3 mg into the muscle as needed for anaphylaxis.   fenofibrate micronized (LOFIBRA) 134 MG capsule, Take  1 capsule  Daily  for Triglycerides (Blood Fats)   valsartan-hydrochlorothiazide (DIOVAN-HCT) 80-12.5 MG tablet, Take 1 tablet Daily for BP  Current Outpatient Medications (Respiratory):    albuterol (VENTOLIN HFA) 108 (90 Base) MCG/ACT inhaler, USE 2 INHALATIONS 15-20 MINUTES APART EVERY 4 HOURS AS NEEDED TO RESCUE ASTHMA   cetirizine (ZYRTEC ALLERGY) 10 MG tablet, Take 1 tablet (10 mg total) by mouth daily.   fexofenadine (ALLEGRA) 180 MG tablet, Take 1 tablet (180 mg total) by mouth daily.   montelukast (SINGULAIR) 10 MG tablet, Take  1 tablet  Daily  for Allergies  Current  Outpatient Medications (Analgesics):    meloxicam (MOBIC) 15 MG tablet, Take      1/2 to 1 tablet        Daily       with Food for Pain & Inflammation   Current Outpatient Medications (Other):    famotidine (PEPCID) 40 MG tablet, Take 1 tablet (40 mg total) by mouth daily.   omeprazole (PRILOSEC) 20 MG capsule, TAKE 1 CAPSULE BY MOUTH ONCE DAILY TO  PREVENT  HEARTBURN  AND  INDIGESTION   phentermine (ADIPEX-P) 37.5 MG tablet, Take  1/2 to 1 tablet  every Morning  for Dieting & Weight  Loss   tamsulosin (FLOMAX) 0.4 MG CAPS capsule, Take  1 tablet  at Bedtime   for Prostate   Vitamin D, Ergocalciferol, (DRISDOL) 1.25 MG (50000 UNIT) CAPS capsule, Take 50,000 Units by mouth every 7 (seven) days.   ciprofloxacin (CILOXAN) 0.3 % ophthalmic solution, 2 gtt in eye(s) Q2Hours while awake for 2 days, then 2 gtt in eye(s) Q4Hours for 5 days.   DULoxetine (CYMBALTA) 30 MG capsule, Take  1 capsule  2 x /day  for Mood & Chronic Pain (Patient not taking: Reported on 07/15/2021)  finasteride (PROSCAR) 5 MG tablet, Take 1 tablet by mouth once daily (Patient not taking: Reported on 07/15/2021)   topiramate (TOPAMAX) 50 MG tablet, Take 1/2 to 1  tablet 2 x / day at Suppertime & Bedtime for Dieting & Weight Loss (Patient not taking: Reported on 07/15/2021)  Allergies: Allergies  Allergen Reactions   Bee Venom Anaphylaxis   Pine Anaphylaxis   Lisinopril Other (See Comments)    Patient intolerant of drug which caused his tongue to tingle.   Celebrex [Celecoxib]     Current Problems (verified) has Essential hypertension; Cluster headaches; OSA treated with BiPAP; Vitamin D deficiency; Testosterone deficiency; Morbid obesity ([place]); Hyperlipidemia, mixed; Screening for colorectal cancer; Dyspnea; Trigger finger, right ring finger; Bilateral hand pain; Depression; Chronic pain of both shoulders; Insomnia; Gastroesophageal reflux disease; Fatty liver; Benign prostatic hyperplasia (BPH) with post-void dribbling;  Thoracic aortic atherosclerosis ([place]) by Chest CT scan on10/21/2020.; and Insulin resistance on their problem list.  Screening Tests Immunization History  Administered Date(s) Administered   Influenza Inj Mdck Quad With Preservative 08/19/2017, 07/25/2019   Influenza Split 08/06/2015   Influenza, High Dose Seasonal PF 08/07/2020, 07/15/2021   Influenza,inj,quad, With Preservative 08/20/2016, 07/20/2019   Moderna Sars-Covid-2 Vaccination 01/04/2020, 01/25/2020   PFIZER(Purple Top)SARS-COV-2 Vaccination 08/27/2020   PPD Test 06/03/2018, 11/28/2019   Pneumococcal Polysaccharide-23 08/20/2016   Tdap 11/06/2016   Zoster, Live 11/06/2016    Preventative care: Last colonoscopy: 2016 called Dr. Collene Mares last year and she said not due until 2026 but pt wants to schedule this year, will call  Prior vaccinations: TD or Tdap: 2018  Influenza: Due for 2021  Pneumococcal: 2017 Shingles/Zostavax: 2018  Names of Other Physician/Practitioners you currently use: 1. [facility] here for primary care 2. [place], 04/2020 3. Dental Exam Dr Kalman Shan, Peridontal disease Q25month, 03/2021 Patient Care Team: Unk Pinto, MD as PCP - General (Internal Medicine)   Surgical: He  has a past surgical history that includes Hemorrhoid surgery (09/2012); Appendectomy (07/2012); Hernia repair; and bone spur removal. Family His family history includes Alcoholism in his father; Anxiety disorder in his mother; Cancer - Colon in his paternal uncle and another family member; Cancer - Prostate in his paternal uncle and another family member; Depression in his mother; Heart disease in his mother; Hyperlipidemia in his mother; Hypertension in his mother; Liver disease in his father; Macular degeneration in his mother; Stroke in his maternal aunt. Social history  He reports that he quit smoking about 8 years ago. His smoking use included cigarettes. He has a 40.00  pack-year smoking history. He has never used smokeless tobacco. He reports current alcohol use of about 2.0 standard drinks per week. He reports that he does not use drugs.   MEDICARE WELLNESS OBJECTIVES: Physical activity: Current Exercise Habits: Home exercise routine, Type of exercise: calisthenics;walking, Time (Minutes): 30, Frequency (Times/Week): 4, Weekly Exercise (Minutes/Week): 120, Intensity: Mild, Exercise limited by: orthopedic condition(s) Cardiac risk factors: Cardiac Risk Factors include: advanced age (>57men, >58 women);dyslipidemia;hypertension;male gender;obesity (BMI >30kg/m2) Depression/mood screen:   Depression screen [facility] 2/9 07/15/2021  Decreased Interest -  Down, Depressed, Hopeless 0  PHQ - 2 Score 0  Altered sleeping -  Tired, decreased energy -  Change in appetite -  Feeling bad or failure about yourself  -  Trouble concentrating -  Moving slowly or fidgety/restless -  Suicidal thoughts -  PHQ-9 Score -  Difficult doing work/chores -    ADLs:  In your present state  of health, do you have any difficulty performing the following activities: 07/15/2021 07/18/2020  Hearing? N N  Vision? N N  Difficulty concentrating or making decisions? N N  Walking or climbing stairs? N N  Dressing or bathing? N N  Doing errands, shopping? N N  Preparing Food and eating ? - N  Using the Toilet? - N  In the past six months, have you accidently leaked urine? - N  Do you have problems with loss of bowel control? - N  Managing your Medications? - N  Managing your Finances? - N  Housekeeping or managing your Housekeeping? - N  Some recent data might be hidden     Cognitive Testing  Alert? Yes  Normal Appearance?Yes  Oriented to person? Yes  Place? Yes   Time? Yes  Recall of three objects?  Yes  Can perform simple calculations? Yes  Displays appropriate judgment?Yes  Can read the correct time from a watch face?Yes  EOL planning: Does Patient Have a Medical Advance  Directive?: Yes Type of Advance Directive: Healthcare Power of Attorney, Living will Does patient want to make changes to medical advance directive?: No - Patient declined Copy of [place] in Chart?: No - copy requested   Objective:   Today's Vitals   07/15/21 1110  BP: 130/62  Pulse: (!) 58  Temp: 97.7 F (36.5 C)  SpO2: 95%  Weight: 236 lb (107 kg)    Body mass index is 34.85 kg/m.  General appearance: alert, no distress, WD/WN, male HEENT: normocephalic, sclerae anicteric, TMs pearly, nares patent, no discharge or erythema, pharynx normal Oral cavity: MMM, no lesions Neck: supple, no lymphadenopathy, no thyromegaly, no masses Heart: RRR, normal S1, S2, no murmurs Lungs: CTA bilaterally, no wheezes, rhonchi, or rales Abdomen: +bs, soft, non tender, non distended, no masses, no hepatomegaly, no splenomegaly. diastasis with hernia noted. Musculoskeletal: nontender, no swelling, no obvious deformity Extremities: no edema, no cyanosis, no clubbing. Diabetic foot exam no deformities, PT & DP pulses 2+ bilaterally, intact touch Pulses: 2+ symmetric, upper and lower extremities, normal cap refill Neurological: alert, oriented x 3, CN2-12 intact, strength normal upper extremities and lower extremities, sensation normal throughout, DTRs 2+ throughout, no cerebellar signs, gait normal Psychiatric: normal affect, behavior normal, pleasant     Medicare Attestation I have personally reviewed: The patient's medical and social history Their use of alcohol, tobacco or illicit drugs Their current medications and supplements The patient's functional ability including ADLs,fall risks, home safety risks, cognitive, and hearing and visual impairment Diet and physical activities Evidence for depression or mood disorders  The patient's weight, height, BMI, and visual acuity have been recorded in the chart.  I have made referrals, counseling, and provided education to the  patient based on review of the above and I have provided the patient with a written personalized care plan for preventive services.      Magda Bernheim ANP-C  [facility]  07/15/2021

## 2021-07-15 ENCOUNTER — Encounter: Payer: Self-pay | Admitting: Nurse Practitioner

## 2021-07-15 ENCOUNTER — Other Ambulatory Visit: Payer: Self-pay

## 2021-07-15 ENCOUNTER — Ambulatory Visit (INDEPENDENT_AMBULATORY_CARE_PROVIDER_SITE_OTHER): Payer: Medicare HMO | Admitting: Nurse Practitioner

## 2021-07-15 VITALS — BP 130/62 | HR 58 | Temp 97.7°F | Wt 236.0 lb

## 2021-07-15 DIAGNOSIS — G4733 Obstructive sleep apnea (adult) (pediatric): Secondary | ICD-10-CM

## 2021-07-15 DIAGNOSIS — N138 Other obstructive and reflux uropathy: Secondary | ICD-10-CM

## 2021-07-15 DIAGNOSIS — H66002 Acute suppurative otitis media without spontaneous rupture of ear drum, left ear: Secondary | ICD-10-CM | POA: Diagnosis not present

## 2021-07-15 DIAGNOSIS — E782 Mixed hyperlipidemia: Secondary | ICD-10-CM | POA: Diagnosis not present

## 2021-07-15 DIAGNOSIS — I10 Essential (primary) hypertension: Secondary | ICD-10-CM | POA: Diagnosis not present

## 2021-07-15 DIAGNOSIS — R6889 Other general symptoms and signs: Secondary | ICD-10-CM

## 2021-07-15 DIAGNOSIS — M7021 Olecranon bursitis, right elbow: Secondary | ICD-10-CM | POA: Insufficient documentation

## 2021-07-15 DIAGNOSIS — K760 Fatty (change of) liver, not elsewhere classified: Secondary | ICD-10-CM | POA: Diagnosis not present

## 2021-07-15 DIAGNOSIS — R7309 Other abnormal glucose: Secondary | ICD-10-CM

## 2021-07-15 DIAGNOSIS — E349 Endocrine disorder, unspecified: Secondary | ICD-10-CM | POA: Diagnosis not present

## 2021-07-15 DIAGNOSIS — I700 Atherosclerosis of aorta: Secondary | ICD-10-CM

## 2021-07-15 DIAGNOSIS — Z1389 Encounter for screening for other disorder: Secondary | ICD-10-CM

## 2021-07-15 DIAGNOSIS — N401 Enlarged prostate with lower urinary tract symptoms: Secondary | ICD-10-CM | POA: Diagnosis not present

## 2021-07-15 DIAGNOSIS — Z23 Encounter for immunization: Secondary | ICD-10-CM

## 2021-07-15 DIAGNOSIS — Z79899 Other long term (current) drug therapy: Secondary | ICD-10-CM

## 2021-07-15 DIAGNOSIS — Z0000 Encounter for general adult medical examination without abnormal findings: Secondary | ICD-10-CM

## 2021-07-15 DIAGNOSIS — Z0001 Encounter for general adult medical examination with abnormal findings: Secondary | ICD-10-CM

## 2021-07-15 DIAGNOSIS — Z6838 Body mass index (BMI) 38.0-38.9, adult: Secondary | ICD-10-CM

## 2021-07-15 MED ORDER — CIPROFLOXACIN HCL 0.3 % OP SOLN: OPHTHALMIC | 0 refills | Status: DC

## 2021-07-15 NOTE — Patient Instructions (Signed)

## 2021-07-16 LAB — CBC WITH DIFFERENTIAL/PLATELET
Absolute Monocytes: 648 cells/uL (ref 200–950)
Basophils Absolute: 40 cells/uL (ref 0–200)
Basophils Relative: 0.5 %
Eosinophils Absolute: 120 cells/uL (ref 15–500)
Eosinophils Relative: 1.5 %
HCT: 43.7 % (ref 38.5–50.0)
Hemoglobin: 14.6 g/dL (ref 13.2–17.1)
Lymphs Abs: 2136 cells/uL (ref 850–3900)
MCH: 30.9 pg (ref 27.0–33.0)
MCHC: 33.4 g/dL (ref 32.0–36.0)
MCV: 92.4 fL (ref 80.0–100.0)
MPV: 10.8 fL (ref 7.5–12.5)
Monocytes Relative: 8.1 %
Neutro Abs: 5056 cells/uL (ref 1500–7800)
Neutrophils Relative %: 63.2 %
Platelets: 201 10*3/uL (ref 140–400)
RBC: 4.73 10*6/uL (ref 4.20–5.80)
RDW: 12.3 % (ref 11.0–15.0)
Total Lymphocyte: 26.7 %
WBC: 8 10*3/uL (ref 3.8–10.8)

## 2021-07-16 LAB — COMPLETE METABOLIC PANEL WITH GFR
AG Ratio: 1.6 (calc) (ref 1.0–2.5)
ALT: 21 U/L (ref 9–46)
AST: 17 U/L (ref 10–35)
Albumin: 4.5 g/dL (ref 3.6–5.1)
Alkaline phosphatase (APISO): 72 U/L (ref 35–144)
BUN: 19 mg/dL (ref 7–25)
CO2: 29 mmol/L (ref 20–32)
Calcium: 9.6 mg/dL (ref 8.6–10.3)
Chloride: 104 mmol/L (ref 98–110)
Creat: 1.04 mg/dL (ref 0.70–1.35)
Globulin: 2.9 g/dL (calc) (ref 1.9–3.7)
Glucose, Bld: 99 mg/dL (ref 65–99)
Potassium: 4.9 mmol/L (ref 3.5–5.3)
Sodium: 141 mmol/L (ref 135–146)
Total Bilirubin: 0.7 mg/dL (ref 0.2–1.2)
Total Protein: 7.4 g/dL (ref 6.1–8.1)
eGFR: 79 mL/min/{1.73_m2} (ref >=60)

## 2021-07-16 LAB — HEMOGLOBIN A1C
Hgb A1c MFr Bld: 5.9 % of total Hgb — ABNORMAL HIGH (ref <5.7)
Mean Plasma Glucose: 123 mg/dL
eAG (mmol/L): 6.8 mmol/L

## 2021-07-16 LAB — URINALYSIS, ROUTINE W REFLEX MICROSCOPIC
Bilirubin Urine: NEGATIVE
Glucose, UA: NEGATIVE
Hgb urine dipstick: NEGATIVE
Ketones, ur: NEGATIVE
Leukocytes,Ua: NEGATIVE
Nitrite: NEGATIVE
Protein, ur: NEGATIVE
Specific Gravity, Urine: 1.007 (ref 1.001–1.035)
pH: 7 (ref 5.0–8.0)

## 2021-07-16 LAB — MICROALBUMIN / CREATININE URINE RATIO
Creatinine, Urine: 35 mg/dL (ref 20–320)
Microalb, Ur: <0.2 mg/dL

## 2021-07-16 LAB — LIPID PANEL
Cholesterol: 155 mg/dL (ref <200)
HDL: 35 mg/dL — ABNORMAL LOW (ref >=40)
LDL Cholesterol (Calc): 97 mg/dL (calc)
Non-HDL Cholesterol (Calc): 120 mg/dL (calc) (ref <130)
Total CHOL/HDL Ratio: 4.4 (calc) (ref <5.0)
Triglycerides: 130 mg/dL (ref <150)

## 2021-07-17 DIAGNOSIS — M25521 Pain in right elbow: Secondary | ICD-10-CM | POA: Diagnosis not present

## 2021-07-17 DIAGNOSIS — M7021 Olecranon bursitis, right elbow: Secondary | ICD-10-CM | POA: Diagnosis not present

## 2021-08-14 ENCOUNTER — Ambulatory Visit: Payer: Medicare HMO | Admitting: Adult Health

## 2021-08-14 DIAGNOSIS — M71342 Other bursal cyst, left hand: Secondary | ICD-10-CM | POA: Diagnosis not present

## 2021-08-14 DIAGNOSIS — M25642 Stiffness of left hand, not elsewhere classified: Secondary | ICD-10-CM | POA: Diagnosis not present

## 2021-08-26 ENCOUNTER — Other Ambulatory Visit: Payer: Self-pay | Admitting: Adult Health Nurse Practitioner

## 2021-08-26 DIAGNOSIS — I10 Essential (primary) hypertension: Secondary | ICD-10-CM

## 2021-09-08 DIAGNOSIS — Z85828 Personal history of other malignant neoplasm of skin: Secondary | ICD-10-CM | POA: Diagnosis not present

## 2021-09-08 DIAGNOSIS — L853 Xerosis cutis: Secondary | ICD-10-CM | POA: Diagnosis not present

## 2021-09-08 DIAGNOSIS — I788 Other diseases of capillaries: Secondary | ICD-10-CM | POA: Diagnosis not present

## 2021-09-08 DIAGNOSIS — L218 Other seborrheic dermatitis: Secondary | ICD-10-CM | POA: Diagnosis not present

## 2021-09-17 DIAGNOSIS — G4733 Obstructive sleep apnea (adult) (pediatric): Secondary | ICD-10-CM | POA: Diagnosis not present

## 2021-09-22 ENCOUNTER — Telehealth: Payer: Self-pay | Admitting: Neurology

## 2021-09-22 NOTE — Telephone Encounter (Signed)
Called and rescheduled patient's 12/6 appointment due to Dr. Rexene Alberts being out of office. Pt became upset over the phone and stated that this was the second time we have rescheduled him and he may have to find a different doctor. I apologized to pt and gave him next available appt of 12/8 with Debbora Presto, NP.

## 2021-09-23 ENCOUNTER — Ambulatory Visit: Payer: Medicare HMO | Admitting: Neurology

## 2021-09-25 ENCOUNTER — Other Ambulatory Visit: Payer: Self-pay

## 2021-09-25 ENCOUNTER — Telehealth (INDEPENDENT_AMBULATORY_CARE_PROVIDER_SITE_OTHER): Payer: Medicare HMO | Admitting: Family Medicine

## 2021-09-25 ENCOUNTER — Encounter: Payer: Self-pay | Admitting: Family Medicine

## 2021-09-25 DIAGNOSIS — G4733 Obstructive sleep apnea (adult) (pediatric): Secondary | ICD-10-CM

## 2021-09-25 NOTE — Patient Instructions (Addendum)
Please continue using your BiPAP regularly. While your insurance requires that you use BiPAP at least 4 hours each night on 70% of the nights, I recommend, that you not skip any nights and use it throughout the night if you can. Getting used to BiPAP and staying with the treatment long term does take time and patience and discipline. Untreated obstructive sleep apnea when it is moderate to severe can have an adverse impact on cardiovascular health and raise her risk for heart disease, arrhythmias, hypertension, congestive heart failure, stroke and diabetes. Untreated obstructive sleep apnea causes sleep disruption, nonrestorative sleep, and sleep deprivation. This can have an impact on your day to day functioning and cause daytime sleepiness and impairment of cognitive function, memory loss, mood disturbance, and problems focussing. Using BiPAP regularly can improve these symptoms.   Follow up in 1 year  

## 2021-09-25 NOTE — Progress Notes (Signed)
   PATIENT: George Montgomery DOB: [birth year]  REASON FOR VISIT: follow up HISTORY FROM: patient  Virtual Visit via Telephone Note  I connected with George Montgomery on 09/25/21 at  1:00 PM EST by telephone and verified that I am speaking with the correct person using two identifiers.   I discussed the limitations, risks, security and privacy concerns of performing an evaluation and management service by telephone and the availability of in person appointments. I also discussed with the patient that there may be a patient responsible charge related to this service. The patient expressed understanding and agreed to proceed.   History of Present Illness:  09/25/21 ALL: George Montgomery is a 65 y.o. male here today for follow up for OSA on BiPAP. He continues to do well on therapy. He is using a new nasal pillow and reports leak has been much better at home. He is sleeping much better. Not snoring. He is feeling well and without concerns, today.      Observations/Objective:  Generalized: televisit  Mentation: Alert oriented to time, place, history taking. Follows all commands speech and language fluent   Assessment and Plan:  65 y.o. year old male  has a past medical history of Arthritis, Asthma, Back pain, Chronic pain of both shoulders, Depression, Dyspnea, Elevated glucose, Environmental allergies, GERD (gastroesophageal reflux disease), HLD (hyperlipidemia), HTN (hypertension), Insomnia, Obesity, OSA on CPAP (08/01/2014), Reflux, and Trigger finger. here with    ICD-10-CM   1. OSA treated with BiPAP  G47.33 For home use only DME Bipap      Mr Going continues to do well on BiPAP therapy. Compliance report reveals excellent compliance. He does have an elevated leak, however, feels that this has improved with new mask. AHI is well managed. He will continue to monitor at home. He was encouraged to continue BiPAP daily for at least 4 hours. He will continue healthy lifestyle habits.  Follow up in 1 year.   Orders Placed This Encounter  Procedures   For home use only DME Bipap    Supplies    Order Specific Question:   Length of Need    Answer:   Lifetime    Order Specific Question:   Inspiratory pressure    Answer:   OTHER SEE COMMENTS    Order Specific Question:   Expiratory pressure    Answer:   OTHER SEE COMMENTS     No orders of the defined types were placed in this encounter.    Follow Up Instructions:  I discussed the assessment and treatment plan with the patient. The patient was provided an opportunity to ask questions and all were answered. The patient agreed with the plan and demonstrated an understanding of the instructions.   The patient was advised to call back or seek an in-person evaluation if the symptoms worsen or if the condition fails to improve as anticipated.  I provided 11 minutes of non-face-to-face time during this encounter. Patient located at their place of residence during [place] visit. Provider is in the office.    Debbora Presto, NP

## 2021-09-26 ENCOUNTER — Other Ambulatory Visit: Payer: Self-pay | Admitting: Internal Medicine

## 2021-10-07 ENCOUNTER — Ambulatory Visit: Payer: Medicare HMO | Admitting: Internal Medicine

## 2021-10-07 ENCOUNTER — Other Ambulatory Visit: Payer: Self-pay | Admitting: Internal Medicine

## 2021-10-07 DIAGNOSIS — J0410 Acute tracheitis without obstruction: Secondary | ICD-10-CM

## 2021-10-16 DIAGNOSIS — M71342 Other bursal cyst, left hand: Secondary | ICD-10-CM | POA: Diagnosis not present

## 2021-10-17 DIAGNOSIS — G4733 Obstructive sleep apnea (adult) (pediatric): Secondary | ICD-10-CM | POA: Diagnosis not present

## 2021-10-29 ENCOUNTER — Ambulatory Visit: Payer: Medicare HMO | Admitting: Internal Medicine

## 2021-10-30 MED ORDER — FINASTERIDE 5 MG PO TABS: 5.0000 mg | ORAL_TABLET | Freq: Every day | ORAL | 0 refills | Status: DC

## 2021-10-30 NOTE — Progress Notes (Signed)
Future Appointments  Date Time Provider Department  10/31/2021 10:30 AM Unk Pinto, MD [facility]  02/18/2022                 CPE  3:00 PM Unk Pinto, MD [facility]  07/15/2022               Wellness 11:00 AM Magda Bernheim, NP [facility]  09/29/2022  8:45 AM Debbora Presto, NP [facility]    History of Present Illness:       This very nice 66 y.o. MWM presents for 6 month follow up with HTN, HLD, Pre-Diabetes and Vitamin D Deficiency. Chest CT scan in Oct 2020 showed Aortic Atherosclerosis.Today he's also c/o pains in his hands & fingers.        Patient is treated for HTN & BP has been controlled at home. Todays BP is at goal - 130/78. Patient has had no complaints of any cardiac type chest pain, palpitations, dyspnea / orthopnea / PND, dizziness, claudication, or dependent edema.       Hyperlipidemia is controlled with diet & meds. Patient denies myalgias or other med SEs. Last Lipids were at goal :  Lab Results  Component Value Date   CHOL 155 07/15/2021   HDL 35 (L) 07/15/2021   LDLCALC 97 07/15/2021   TRIG 130 07/15/2021   CHOLHDL 4.4 07/15/2021     Also, the patient has history of PreDiabetes and has had no symptoms of reactive hypoglycemia, diabetic polys, paresthesias or visual blurring.  Last A1c was near goal :  Lab Results  Component Value Date   HGBA1C 5.9 (H) 07/15/2021  \                                                         Further, the patient also has history of Vitamin D Deficiency and supplements vitamin D without any suspected side-effects. Last vitamin D was still slightly low :  Lab Results  Component Value Date   VD25OH 47 02/17/2021  \  Current Outpatient Medications on File Prior to Visit  Medication Sig   albuterol HFA inhaler USE 2 INHALATIONS EVERY 4 HOURS AS NEEDED TO RESCUE ASTHMA   bisoprolol 10 MG tablet Take 1 tablet daily for blood pressure   cetirizine  10 MG tablet Take 1 tablet daily.   ciprofloxacin 0.3 % ophth  soln 2 gtt  in eye(s) Q2Hours while awake for 2 days, then 2 gtt in eye(s) Q4Hours for 5 days.   EPINEPHrine 0.3 mg/0.3 mL injec Inject 0.3 mg into the muscle as needed for anaphylaxis.   famotidine ( 40 MG tablet Take 1 tablet  daily.   fenofibrate  134 MG capsule Take  1 capsule  Daily     fexofenadine  180 MG tablet Take 1 tablet daily.   meloxicam  15 MG tablet Take  1/2 to 1 tablet Daily     montelukast 10 MG tablet Take  1 tablet  Daily  for Allergies   omeprazole 20 MG capsule TAKE 1 CAPSULE   DAILY    phentermine 37.5 MG tablet Take  1/2 to 1 tablet  every Morning    sildenafil 100 MG tablet TAKE ONE TABLET DAILY AS NEEDED   tamsulosin 0.4 MG CAPS capsule Take  1 tablet  at Bedtime  valsartan-hctz 80-12.5 MG tablet Take 1 tablet Daily \   Vitamin D  50,000 u Take  every 7 days.  \  Allergies  Allergen Reactions   Bee Venom Anaphylaxis   Pine Anaphylaxis   Lisinopril Other (See Comments)    Patient intolerant of drug which caused his tongue to tingle.   Celebrex [Celecoxib]     PMHx:   Past Medical History:  Diagnosis Date   Arthritis    Asthma    Back pain    Chronic pain of both shoulders    Depression    Dyspnea    Elevated glucose    Environmental allergies    GERD (gastroesophageal reflux disease)    HLD (hyperlipidemia)    HTN (hypertension)    Insomnia    Obesity    OSA on CPAP 08/01/2014   Reflux    Trigger finger     Immunization History  Administered Date(s) Administered   Influenza Inj Mdck Quad With Preservative 08/19/2017, 07/25/2019   Influenza Split 08/06/2015   Influenza, High Dose Seasonal PF 08/07/2020, 07/15/2021   Influenza,inj,quad, With Preservative 08/20/2016, 07/20/2019   Moderna Sars-Covid-2 Vacc 01/04/2020, 01/25/2020   PFIZER  SARS-COV-2 Vacc 08/27/2020   PPD Test 06/03/2018, 11/28/2019   Pneumococcal -23 08/20/2016   Tdap 11/06/2016   Zoster, Live 11/06/2016    Past Surgical History:  Procedure Laterality Date   APPENDECTOMY   07/2012   bone spur removal     HEMORRHOID SURGERY  09/2012   HERNIA REPAIR      FHx:    Reviewed / unchanged  SHx:    Reviewed / unchanged   Systems Review:  Constitutional: Denies fever, chills, wt changes, headaches, insomnia, fatigue, night sweats, change in appetite. Eyes: Denies redness, blurred vision, diplopia, discharge, itchy, watery eyes.  ENT: Denies discharge, congestion, post nasal drip, epistaxis, sore throat, earache, hearing loss, dental pain, tinnitus, vertigo, sinus pain, snoring.  CV: Denies chest pain, palpitations, irregular heartbeat, syncope, dyspnea, diaphoresis, orthopnea, PND, claudication or edema. Respiratory: denies cough, dyspnea, DOE, pleurisy, hoarseness, laryngitis, wheezing.  Gastrointestinal: Denies dysphagia, odynophagia, heartburn, reflux, water brash, abdominal pain or cramps, nausea, vomiting, bloating, diarrhea, constipation, hematemesis, melena, hematochezia  or hemorrhoids. Genitourinary: Denies dysuria, frequency, urgency, nocturia, hesitancy, discharge, hematuria or flank pain. Musculoskeletal: Denies arthralgias, myalgias, stiffness, jt. swelling, pain, limping or strain/sprain.  Skin: Denies pruritus, rash, hives, warts, acne, eczema or change in skin lesion(s). Neuro: No weakness, tremor, incoordination, spasms, paresthesia or pain. Psychiatric: Denies confusion, memory loss or sensory loss. Endo: Denies change in weight, skin or hair change.  Heme/Lymph: No excessive bleeding, bruising or enlarged lymph nodes.  Physical Exam  BP 130/78    Pulse (!) 59    Temp 97.9 F (36.6 C)    Resp 16    Ht 5' 9.5" (1.765 m)    Wt 239 lb 12.8 oz (108.8 kg)    SpO2 97%    BMI 34.90 kg/m   Appears  well nourished, well groomed  and in no distress.  Eyes: PERRLA, EOMs, conjunctiva no swelling or erythema. Sinuses: No frontal/maxillary tenderness ENT/Mouth: EAC's clear, TM's nl w/o erythema, bulging. Nares clear w/o erythema, swelling, exudates.  Oropharynx clear without erythema or exudates. Oral hygiene is good. Tongue normal, non obstructing. Hearing intact.  Neck: Supple. Thyroid not palpable. Car 2+/2+ without bruits, nodes or JVD. Chest: Respirations nl with BS clear & equal w/o rales, rhonchi, wheezing or stridor.  Cor: Heart sounds normal w/ regular rate and rhythm without  sig. murmurs, gallops, clicks or rubs. Peripheral pulses normal and equal  without edema.  Abdomen: Soft & bowel sounds normal. Non-tender w/o guarding, rebound, hernias, masses or organomegaly.  Lymphatics: Unremarkable.  Musculoskeletal: Full ROM all peripheral extremities, joint stability, 5/5 strength and normal gait.  Skin: Warm, dry without exposed rashes, lesions or ecchymosis apparent.  Neuro: Cranial nerves intact, reflexes equal bilaterally. Sensory-motor testing grossly intact. Tendon reflexes grossly intact.  Pysch: Alert & oriented x 3.  Insight and judgement nl & appropriate. No ideations.  Assessment and Plan:  1. Essential hypertension  - Continue medication, monitor blood pressure at home.  - Continue DASH diet.  Reminder to go to the ER if any CP,  SOB, nausea, dizziness, severe HA, changes vision/speech.   - CBC with Differential/Platelet - COMPLETE METABOLIC PANEL WITH GFR - Magnesium - TSH  2. Hyperlipidemia, mixed  - Continue diet/meds, exercise,& lifestyle modifications.  - Continue monitor periodic cholesterol/liver & renal functions    - Lipid panel - TSH  3. Abnormal glucose  - Continue diet, exercise  - Lifestyle modifications.  - Monitor appropriate labs    - Hemoglobin A1c - Insulin, random  4. Vitamin D deficiency  - Continue supplementation   - VITAMIN D 25 Hydroxy   5. Thoracic aortic atherosclerosis ([place]) by Chest ct SCAN 0N 08/09/2019  - Lipid panel  6. Medication management  - CBC with Differential/Platelet - COMPLETE METABOLIC PANEL WITH GFR - Magnesium - Lipid panel - TSH - Hemoglobin  A1c - Insulin, random - VITAMIN D 25 Hydroxy         Discussed  regular exercise, BP monitoring, weight control to achieve/maintain BMI less than 25 and discussed med and SE's. Recommended labs to assess and monitor clinical status with further disposition pending results of labs.  I discussed the assessment and treatment plan with the patient. The patient was provided an opportunity to ask questions and all were answered. The patient agreed with the plan and demonstrated an understanding of the instructions.  I provided over 30 minutes of exam, counseling, chart review and  complex critical decision making.        The patient was advised to call back or seek an in-person evaluation if the symptoms worsen or if the condition fails to improve as anticipated.   Kirtland Bouchard, MD

## 2021-10-31 ENCOUNTER — Encounter: Payer: Self-pay | Admitting: Internal Medicine

## 2021-10-31 ENCOUNTER — Ambulatory Visit (INDEPENDENT_AMBULATORY_CARE_PROVIDER_SITE_OTHER): Payer: Medicare HMO | Admitting: Internal Medicine

## 2021-10-31 ENCOUNTER — Other Ambulatory Visit: Payer: Self-pay

## 2021-10-31 VITALS — BP 130/78 | HR 59 | Temp 97.9°F | Resp 16 | Ht 69.5 in | Wt 239.8 lb

## 2021-10-31 DIAGNOSIS — I10 Essential (primary) hypertension: Secondary | ICD-10-CM

## 2021-10-31 DIAGNOSIS — E782 Mixed hyperlipidemia: Secondary | ICD-10-CM

## 2021-10-31 DIAGNOSIS — I700 Atherosclerosis of aorta: Secondary | ICD-10-CM

## 2021-10-31 DIAGNOSIS — Z23 Encounter for immunization: Secondary | ICD-10-CM | POA: Diagnosis not present

## 2021-10-31 DIAGNOSIS — Z79899 Other long term (current) drug therapy: Secondary | ICD-10-CM | POA: Diagnosis not present

## 2021-10-31 DIAGNOSIS — E559 Vitamin D deficiency, unspecified: Secondary | ICD-10-CM

## 2021-10-31 DIAGNOSIS — R7309 Other abnormal glucose: Secondary | ICD-10-CM

## 2021-10-31 MED ORDER — ALBUTEROL SULFATE HFA 108 (90 BASE) MCG/ACT IN AERS: INHALATION_SPRAY | RESPIRATORY_TRACT | 3 refills | Status: DC

## 2021-10-31 NOTE — Patient Instructions (Signed)

## 2021-11-01 NOTE — Progress Notes (Signed)
============================================================ °-   Test results slightly outside the reference range are not unusual. If there is anything important, I will review this with you,  otherwise it is considered normal test values.  If you have further questions,  please do not hesitate to contact me at the office or via My Chart.  ============================================================ ============================================================  -   Total Chol = 142   and LDL Chol = 86  -  Both  Excellent   - Very low risk for Heart Attack  / Stroke ============================================================ ============================================================  -  A1c = 6.1%  - Blood sugar and A1c sTILL  elevated in the borderline and  early or pre-diabetes range which has the same   300% increased risk for heart attack, stroke, cancer and   alzheimer- type vascular dementia as full blown diabetes.   But the good news is that diet, exercise with  weight loss can cure the early diabetes at this point. ============================================================ ============================================================   -  It is very important that you work harder with diet by  avoiding all foods that are white except chicken,   fish & calliflower.  - Avoid white rice  (brown & wild rice is OK),   - Avoid white potatoes  (sweet potatoes in moderation is OK),   White bread or wheat bread or anything made out of   white flour like bagels, donuts, rolls, buns, biscuits, cakes,  - pastries, cookies, pizza crust, and pasta (made from  white flour & egg whites)   - vegetarian pasta or spinach or wheat pasta is OK.  - Multigrain breads like Arnold's, Pepperidge Farm or   multigrain sandwich thins or high fiber breads like   [place] bread or "Dave's Killer" breads that are  4 to 5 grams fiber per slice !  are best.    Diet, exercise and weight  loss can reverse and cure  diabetes in the early stages.   ============================================================ ============================================================  - Vitamin D = 35 - - Vitamin D goal is between 70-100.   - Please make sure that you are taking your Vitamin D as directed.   - It is very important as a natural anti-inflammatory and helping the  immune system protect against viral infections, like the Covid-19    helping hair, skin, and nails, as well as reducing stroke and  heart attack risk.   - It helps your bones and helps with mood.  - It also decreases numerous cancer risks so please  take it as directed.   - Low Vit D is associated with a 200-300% higher risk for  CANCER   and 200-300% higher risk for HEART   ATTACK  &  STROKE.    - It is also associated with higher death rate at younger ages,   autoimmune diseases like Rheumatoid arthritis, Lupus,  Multiple Sclerosis.     - Also many other serious conditions, like depression, Alzheimer's  Dementia, infertility, muscle aches, fatigue, fibromyalgia   - just to name a few. ============================================================ ============================================================  - Suggest stop the Vit D 50,0000 as discussed &   - Start taking Vitamin D 5,000 unit capsule x 2 capsules = 10,000 units /daily  ! ============================================================ ============================================================  - All Else - CBC - Kidneys - Electrolytes - Liver - Magnesium & Thyroid    - all  Normal / OK ============================================================ ============================================================

## 2021-11-03 LAB — LIPID PANEL
Cholesterol: 142 mg/dL (ref <200)
HDL: 34 mg/dL — ABNORMAL LOW (ref >=40)
LDL Cholesterol (Calc): 86 mg/dL (calc)
Non-HDL Cholesterol (Calc): 108 mg/dL (calc) (ref <130)
Total CHOL/HDL Ratio: 4.2 (calc) (ref <5.0)
Triglycerides: 127 mg/dL (ref <150)

## 2021-11-03 LAB — COMPLETE METABOLIC PANEL WITH GFR
AG Ratio: 1.8 (calc) (ref 1.0–2.5)
ALT: 31 U/L (ref 9–46)
AST: 25 U/L (ref 10–35)
Albumin: 4.5 g/dL (ref 3.6–5.1)
Alkaline phosphatase (APISO): 71 U/L (ref 35–144)
BUN: 24 mg/dL (ref 7–25)
CO2: 27 mmol/L (ref 20–32)
Calcium: 9.3 mg/dL (ref 8.6–10.3)
Chloride: 105 mmol/L (ref 98–110)
Creat: 1.24 mg/dL (ref 0.70–1.35)
Globulin: 2.5 g/dL (calc) (ref 1.9–3.7)
Glucose, Bld: 115 mg/dL — ABNORMAL HIGH (ref 65–99)
Potassium: 4.3 mmol/L (ref 3.5–5.3)
Sodium: 140 mmol/L (ref 135–146)
Total Bilirubin: 0.6 mg/dL (ref 0.2–1.2)
Total Protein: 7 g/dL (ref 6.1–8.1)
eGFR: 64 mL/min/{1.73_m2} (ref >=60)

## 2021-11-03 LAB — CBC WITH DIFFERENTIAL/PLATELET
Absolute Monocytes: 552 cells/uL (ref 200–950)
Basophils Absolute: 31 cells/uL (ref 0–200)
Basophils Relative: 0.5 %
Eosinophils Absolute: 99 cells/uL (ref 15–500)
Eosinophils Relative: 1.6 %
HCT: 42.4 % (ref 38.5–50.0)
Hemoglobin: 14.5 g/dL (ref 13.2–17.1)
Lymphs Abs: 1990 cells/uL (ref 850–3900)
MCH: 31.3 pg (ref 27.0–33.0)
MCHC: 34.2 g/dL (ref 32.0–36.0)
MCV: 91.6 fL (ref 80.0–100.0)
MPV: 11.1 fL (ref 7.5–12.5)
Monocytes Relative: 8.9 %
Neutro Abs: 3528 cells/uL (ref 1500–7800)
Neutrophils Relative %: 56.9 %
Platelets: 192 10*3/uL (ref 140–400)
RBC: 4.63 10*6/uL (ref 4.20–5.80)
RDW: 12.4 % (ref 11.0–15.0)
Total Lymphocyte: 32.1 %
WBC: 6.2 10*3/uL (ref 3.8–10.8)

## 2021-11-03 LAB — TSH: TSH: 1.46 mIU/L (ref 0.40–4.50)

## 2021-11-03 LAB — HEMOGLOBIN A1C
Hgb A1c MFr Bld: 6.1 % of total Hgb — ABNORMAL HIGH (ref <5.7)
Mean Plasma Glucose: 128 mg/dL
eAG (mmol/L): 7.1 mmol/L

## 2021-11-03 LAB — VITAMIN D 25 HYDROXY (VIT D DEFICIENCY, FRACTURES): Vit D, 25-Hydroxy: 35 ng/mL (ref 30–100)

## 2021-11-03 LAB — MAGNESIUM: Magnesium: 2.1 mg/dL (ref 1.5–2.5)

## 2021-11-03 LAB — INSULIN, RANDOM: Insulin: 29.6 u[IU]/mL — ABNORMAL HIGH

## 2021-11-17 DIAGNOSIS — G4733 Obstructive sleep apnea (adult) (pediatric): Secondary | ICD-10-CM | POA: Diagnosis not present

## 2021-12-01 ENCOUNTER — Other Ambulatory Visit: Payer: Self-pay | Admitting: Internal Medicine

## 2021-12-01 DIAGNOSIS — N401 Enlarged prostate with lower urinary tract symptoms: Secondary | ICD-10-CM

## 2021-12-03 ENCOUNTER — Other Ambulatory Visit: Payer: Self-pay | Admitting: Internal Medicine

## 2021-12-03 ENCOUNTER — Other Ambulatory Visit: Payer: Self-pay

## 2021-12-03 DIAGNOSIS — L812 Freckles: Secondary | ICD-10-CM | POA: Diagnosis not present

## 2021-12-03 DIAGNOSIS — Z85828 Personal history of other malignant neoplasm of skin: Secondary | ICD-10-CM | POA: Diagnosis not present

## 2021-12-03 DIAGNOSIS — Z6838 Body mass index (BMI) 38.0-38.9, adult: Secondary | ICD-10-CM

## 2021-12-03 DIAGNOSIS — L308 Other specified dermatitis: Secondary | ICD-10-CM | POA: Diagnosis not present

## 2021-12-03 DIAGNOSIS — L280 Lichen simplex chronicus: Secondary | ICD-10-CM | POA: Diagnosis not present

## 2021-12-03 DIAGNOSIS — L218 Other seborrheic dermatitis: Secondary | ICD-10-CM | POA: Diagnosis not present

## 2021-12-03 DIAGNOSIS — L821 Other seborrheic keratosis: Secondary | ICD-10-CM | POA: Diagnosis not present

## 2021-12-03 DIAGNOSIS — D1801 Hemangioma of skin and subcutaneous tissue: Secondary | ICD-10-CM | POA: Diagnosis not present

## 2021-12-03 MED ORDER — MELOXICAM 15 MG PO TABS: ORAL_TABLET | ORAL | 0 refills | Status: DC

## 2021-12-11 ENCOUNTER — Telehealth: Payer: Self-pay | Admitting: Internal Medicine

## 2021-12-11 NOTE — Chronic Care Management (AMB) (Signed)
°  Chronic Care Management   Outreach Note  12/11/2021 Name: George Montgomery MRN: [id number] DOB: 1956/09/10  Referred by: Unk Pinto, MD Reason for referral : No chief complaint on file.   An unsuccessful telephone outreach was attempted today. The patient was referred to the pharmacist for assistance with care management and care coordination.   Follow Up Plan:   Tatjana Dellinger Upstream Scheduler

## 2021-12-12 DIAGNOSIS — M79642 Pain in left hand: Secondary | ICD-10-CM | POA: Diagnosis not present

## 2021-12-12 DIAGNOSIS — M71342 Other bursal cyst, left hand: Secondary | ICD-10-CM | POA: Diagnosis not present

## 2021-12-12 DIAGNOSIS — M79641 Pain in right hand: Secondary | ICD-10-CM | POA: Diagnosis not present

## 2021-12-15 DIAGNOSIS — H35033 Hypertensive retinopathy, bilateral: Secondary | ICD-10-CM | POA: Diagnosis not present

## 2021-12-15 DIAGNOSIS — H524 Presbyopia: Secondary | ICD-10-CM | POA: Diagnosis not present

## 2021-12-16 ENCOUNTER — Telehealth: Payer: Self-pay | Admitting: Internal Medicine

## 2021-12-16 DIAGNOSIS — G4733 Obstructive sleep apnea (adult) (pediatric): Secondary | ICD-10-CM | POA: Diagnosis not present

## 2021-12-16 NOTE — Chronic Care Management (AMB) (Signed)
°  Chronic Care Management   Outreach Note  12/16/2021 Name: KESLER WICKHAM MRN: [id number] DOB: [birth year]  Referred by: Unk Pinto, MD Reason for referral : No chief complaint on file.   A second unsuccessful telephone outreach was attempted today. The patient was referred to pharmacist for assistance with care management and care coordination.  Follow Up Plan:   Tatjana Dellinger Upstream Scheduler

## 2021-12-17 ENCOUNTER — Telehealth: Payer: Self-pay | Admitting: Internal Medicine

## 2021-12-17 NOTE — Progress Notes (Signed)
?  Chronic Care Management  ? ?Note ? ?12/17/2021 ?Name: OLIVIA ROYSE MRN: [id number] DOB: [birth year] ? ?KORD MONETTE is a 66 y.o. year old male who is a primary care patient of Unk Pinto, MD. I reached out to [name] by phone today in response to a referral sent by Mr. [place] PCP, Unk Pinto, MD.  ? ?Mr. Cousineau was given information about Chronic Care Management services today including:  ?CCM service includes personalized support from designated clinical staff supervised by his physician, including individualized plan of care and coordination with other care providers ?24/7 contact phone numbers for assistance for urgent and routine care needs. ?Service will only be billed when office clinical staff spend 20 minutes or more in a month to coordinate care. ?Only one practitioner may furnish and bill the service in a calendar month. ?The patient may stop CCM services at any time (effective at the end of the month) by phone call to the office staff. ? ? ?Patient agreed to services and verbal consent obtained.  ? ?Follow up plan: ? ? ?Tatjana Dellinger ?Upstream Scheduler  ?

## 2021-12-22 ENCOUNTER — Encounter: Payer: Self-pay | Admitting: Internal Medicine

## 2021-12-22 ENCOUNTER — Other Ambulatory Visit: Payer: Self-pay | Admitting: Internal Medicine

## 2021-12-22 MED ORDER — PSEUDOEPHEDRINE HCL ER 120 MG PO TB12: ORAL_TABLET | ORAL | 3 refills | Status: DC

## 2021-12-23 ENCOUNTER — Encounter: Payer: Self-pay | Admitting: Internal Medicine

## 2021-12-23 NOTE — Progress Notes (Signed)
? ? ?Future Appointments  ?Date Time Provider Department  ?12/24/2021 11:30 AM Unk Pinto, MD [facility]  ?02/18/2022             CPE  3:00 PM Unk Pinto, MD [facility]  ?07/15/2022         Wellness 11:00 AM Magda Bernheim, NP [facility]  ?09/29/2022  8:45 AM Debbora Presto, NP [facility]  ? ? ?History of Present Illness: ? ?   Patient is a very nice 66 yo MWM presenting with sx's of head & chest congestion & feels as though he needs an antibiotic. Was recently treated with Zpak for sinusitis & sx's never completely resolved. Denies fevers chills sweats. Reports freq dry hacking cough.  ? ?Medications ? ?  bisoprolol (ZEBETA) 10 MG tablet, Take 1 tablet by mouth once daily for blood pressure ? ?  EPINEPHrine 0.3 mg/0.3 mL  Inject 0.3 mg into the muscle as needed for anaphylaxis. ? ?  fenofibrate  134 MG capsule, Take  1 capsule  Daily ?  sildenafil  100 MG tablet, TAKE ONE TABLET  DAILY AS NEEDED ?  valsartan-hctz 80-12.5 MG tablet, Take 1 tablet Daily for BP ?  albuterol HFA  inhaler, USE 2 INHALATIONS  EVERY 4 HRS AS NEEDED  ?  cetirizine 10 MG tablet, Take 1 tablet ( daily. ?  montelukast 10 MG tablet, Take  1 tablet  Daily  for Allergies ?  pseudoephedrine  120 MG 12 hr tablet, Take  1 tablet  2 x /day  ?  meloxicam 15 MG tablet, Take 1/2 to 1 tablet Daily     ?  famotidine 40 MG tablet, Take 1 tablet (daily. ?  omeprazole 20 MG capsule, TAKE 1 CAPSULE DAILY  ?  phentermine 37.5 MG tablet, TAKE 1/2 TO 1 TABLET  DAILY  ?  tamsulosin 0.4 MG CAPS capsule, TAKE 1 CAPSULE  AT BEDTIME  ? ?Problem list ?He has Essential hypertension; Cluster headaches; OSA treated with BiPAP; Vitamin D deficiency; Testosterone deficiency; Morbid obesity ([place]); Hyperlipidemia, mixed; Screening for colorectal cancer; Dyspnea; Trigger finger, right ring finger; Bilateral hand pain; Depression; Chronic pain of both shoulders; Insomnia; Gastroesophageal reflux disease; Fatty liver; Benign prostatic hyperplasia (BPH) with post-void dribbling;  Thoracic aortic atherosclerosis ([place]) by Chest CT scan on10/21/2020.; and Insulin resistance on their problem list. ?  ?Observations/Objective: ? ?BP 118/68   Pulse (!) 51   Temp 97.9 ?F (36.6 ?C)   Resp 18   Ht 5' 9.5" (1.765 m)   Wt 243 lb 9.6 oz (110.5 kg)   SpO2 97%   BMI 35.46 kg/m?  ? ?(+) dry cough w/o stridor.  ? ?HEENT - EACs / TMs- Nl. (+) fronto-Maxillary tenderness. O/P - clear.  ?Neck - supple. Car 2+/2+ w/o Br, nodes , JVD.  ?Chest - Scattered ins dry rales & few post - tussive forced expiratory rhonchi  & ? end terminal wheezes.  ?Cor - Nl HS. RRR w/o sig MGR. PP 1(+). No edema. ?MS- FROM w/o deformities.  Gait Nl. ?Neuro -  Nl w/o focal abnormalities. ? ? ?Assessment and Plan: ? ?1. Acute non-recurrent pansinusitis ? ?- Dexamethasone  4 MG tablet;  ?Take 1 tab 3 x day - 3 days, then 2 x day - 3 days, then 1 tab daily   ?Dispense: 20 tablet ? ?- levofloxacin  500 MG tablet;  ?Take 1 tablet daily with food for infection   ?Dispense: 15 tablet ? ?2. Bronchitis with tracheitis ? ?- Dexamethasone  4 MG  tablet;  ?Take 1 tab 3 x day - 3 days, then 2 x day - 3 days, then 1 tab daily   ?Dispense: 20 table ? ?- levofloxacin 500 MG tablet;  ?Take 1 tablet daily with food for infection   ?Dispense: 15 tablets ? ?- benzonatate (TESSALON) 200 MG capsule;  ?Take 1 perle 3 x / day to prevent cough  ? Dispense: 30 capsule; Refill: 1 ? ?- promethazine-DM) 6.25-15 MG/5ML syrup;  ?Take 1 tsp every 4 hours if needed for cough   ?Dispense: 240 mL; Refill: 1 ? ? ?Follow Up Instructions: ? ?  ?    I discussed the assessment and treatment plan with the patient. The patient was provided an opportunity to ask questions and all were answered. The patient agreed with the plan and demonstrated an understanding of the instructions. ?  ?    The patient was advised to call back or seek an in-person evaluation if the symptoms worsen or if the condition fails to improve as anticipated. ? ? ?Kirtland Bouchard, MD ? ? ?

## 2021-12-24 ENCOUNTER — Ambulatory Visit (INDEPENDENT_AMBULATORY_CARE_PROVIDER_SITE_OTHER): Payer: Medicare HMO | Admitting: Internal Medicine

## 2021-12-24 ENCOUNTER — Telehealth: Payer: Medicare HMO | Admitting: Pharmacist

## 2021-12-24 ENCOUNTER — Encounter: Payer: Self-pay | Admitting: Internal Medicine

## 2021-12-24 ENCOUNTER — Other Ambulatory Visit: Payer: Self-pay

## 2021-12-24 VITALS — BP 118/68 | HR 51 | Temp 97.9°F | Resp 18 | Ht 69.5 in | Wt 243.6 lb

## 2021-12-24 DIAGNOSIS — J0140 Acute pansinusitis, unspecified: Secondary | ICD-10-CM

## 2021-12-24 DIAGNOSIS — J40 Bronchitis, not specified as acute or chronic: Secondary | ICD-10-CM

## 2021-12-24 MED ORDER — LEVOFLOXACIN 500 MG PO TABS: ORAL_TABLET | ORAL | 0 refills | Status: DC

## 2021-12-24 MED ORDER — BENZONATATE 200 MG PO CAPS: ORAL_CAPSULE | ORAL | 1 refills | Status: DC

## 2021-12-24 MED ORDER — DEXAMETHASONE 4 MG PO TABS: ORAL_TABLET | ORAL | 0 refills | Status: DC

## 2021-12-24 MED ORDER — PROMETHAZINE-DM 6.25-15 MG/5ML PO SYRP: ORAL_SOLUTION | ORAL | 1 refills | Status: DC

## 2022-01-08 ENCOUNTER — Other Ambulatory Visit: Payer: Self-pay | Admitting: Internal Medicine

## 2022-01-08 DIAGNOSIS — J0140 Acute pansinusitis, unspecified: Secondary | ICD-10-CM

## 2022-01-09 ENCOUNTER — Other Ambulatory Visit: Payer: Self-pay | Admitting: Nurse Practitioner

## 2022-01-09 ENCOUNTER — Ambulatory Visit
Admission: RE | Admit: 2022-01-09 | Discharge: 2022-01-09 | Disposition: A | Discharge status: Home / Self Care | Payer: Medicare HMO | Source: Ambulatory Visit | Attending: Nurse Practitioner | Admitting: Nurse Practitioner

## 2022-01-09 ENCOUNTER — Encounter: Payer: Self-pay | Admitting: Nurse Practitioner

## 2022-01-09 ENCOUNTER — Ambulatory Visit (INDEPENDENT_AMBULATORY_CARE_PROVIDER_SITE_OTHER): Payer: Medicare HMO | Admitting: Nurse Practitioner

## 2022-01-09 ENCOUNTER — Other Ambulatory Visit: Payer: Self-pay

## 2022-01-09 VITALS — BP 122/64 | HR 57 | Temp 97.7°F | Wt 241.8 lb

## 2022-01-09 DIAGNOSIS — R059 Cough, unspecified: Secondary | ICD-10-CM | POA: Diagnosis not present

## 2022-01-09 DIAGNOSIS — J4521 Mild intermittent asthma with (acute) exacerbation: Secondary | ICD-10-CM

## 2022-01-09 DIAGNOSIS — J069 Acute upper respiratory infection, unspecified: Secondary | ICD-10-CM

## 2022-01-09 DIAGNOSIS — R0602 Shortness of breath: Secondary | ICD-10-CM | POA: Diagnosis not present

## 2022-01-09 DIAGNOSIS — R062 Wheezing: Secondary | ICD-10-CM

## 2022-01-09 MED ORDER — IPRATROPIUM-ALBUTEROL 0.5-2.5 (3) MG/3ML IN SOLN
3.0000 mL | Freq: Once | RESPIRATORY_TRACT | Status: DC
Start: 2022-01-09 — End: 2022-03-07

## 2022-01-09 NOTE — Progress Notes (Signed)
Assessment and Plan: ? ?Mr. Javid was seen today for a follow up. ? ?Diagnoses and all order for this visit: ? ?1. URI with cough and congestion ?Considering length of symptoms (onset 12/23/21) will obtain CXR to assess for any underlying PNA given hx of asthma.  ? ?- DG Chest 2 View - Results pending. ? ?Nebulizer tmt in office. Lungs CTA in all fields post tmt.  Patient tolerated well.   ?- ipratropium-albuterol (DUONEB) 0.5-2.5 (3) MG/3ML nebulizer solution 3 mL ? ?2. Intermittent asthma with acute exacerbation, unspecified asthma severity ?Continue Albuterol as needed.   ?Sample Trelegy 100 mcg provided.  Inhaler instructions provided on use as well as rinsing mouth after each daily inhalation to prevent thrust. ?Discussed if continuing to use Albuterol inhaler more than twice weekly with use of maintainence inhaler Trelegy, contact office for further evaluation.  ? ?3. Wheezing ?Continue Alubtterol PRN ?Start Trelegy sample ? ?4. SOB (shortness of breath) ?Continue Alubterol PRN ?Start Trelegy sample  ? ? ?Further disposition pending results of labs. Discussed med's effects and SE's.   ?Over 30 minutes of exam, counseling, chart review, and critical decision making was performed.  ? ?Future Appointments  ?Date Time Provider [place]  ?02/18/2022  3:00 PM Unk Pinto, MD [facility] None  ?03/10/2022 11:15 AM Newton Pigg, RPH [facility] None  ?07/15/2022 11:00 AM Magda Bernheim, NP [facility] None  ?09/29/2022  8:45 AM Lomax, Amy, NP [facility] None  ? ? ?------------------------------------------------------------------------------------------------------------------ ? ? ?HPI ?BP 122/64   Pulse (!) 57   Temp 97.7 ?F (36.5 ?C)   Wt 241 lb 12.8 oz (109.7 kg)   SpO2 96%   BMI 35.20 kg/m?  ? ?66 y.o.male presents with complaints of nasal congestion, chest congestion, cough, SOB, DOE for approximately 1 mo.  He was seen in office 12/24/21 and treated for pansinusitis with Dexamethasone, Levofloxacin,  Benzonatate, Promethazine.  He reports improving from that visit but has not felt 100% better.  He has just returned yesterday from traveling out [place].  He completed a 27 mile bike ride where he felt more winded and unable to catch his breath, which is uncommon for him.  Has hx of allergic rhinitis. Takes Zyrtec daily. No hx of asthma, former smoker quit 2014.  Denies fever, chills, N/V. ? ?Past Medical History:  ?Diagnosis Date  ? Arthritis   ? Asthma   ? Back pain   ? Chronic pain of both shoulders   ? Depression   ? Dyspnea   ? Elevated glucose   ? Environmental allergies   ? GERD (gastroesophageal reflux disease)   ? HLD (hyperlipidemia)   ? HTN (hypertension)   ? Insomnia   ? Obesity   ? OSA on CPAP 08/01/2014  ? Reflux   ? Trigger finger   ?  ? ?Allergies  ?Allergen Reactions  ? Bee Venom Anaphylaxis  ? Pine Anaphylaxis  ? Peanut-Containing Drug Products Itching  ? Lisinopril Other (See Comments)  ?  Patient intolerant of drug which caused his tongue to tingle.  ? Celebrex [Celecoxib]   ? ? ?Current Outpatient Medications on File Prior to Visit  ?Medication Sig  ? albuterol (VENTOLIN HFA) 108 (90 Base) MCG/ACT inhaler USE 2 INHALATIONS 15-20 MINUTES APART EVERY 4 HOURS AS NEEDED TO RESCUE ASTHMA  ? benzonatate (TESSALON) 200 MG capsule Take 1 perle 3 x / day to prevent cough  ? bisoprolol (ZEBETA) 10 MG tablet Take 1 tablet by mouth once daily for blood pressure  ? cetirizine (ZYRTEC  ALLERGY) 10 MG tablet Take 1 tablet (10 mg total) by mouth daily.  ? ciprofloxacin (CILOXAN) 0.3 % ophthalmic solution 2 gtt in eye(s) Q2Hours while awake for 2 days, then 2 gtt in eye(s) Q4Hours for 5 days.  ? EPINEPHrine 0.3 mg/0.3 mL IJ SOAJ injection Inject 0.3 mg into the muscle as needed for anaphylaxis.  ? famotidine (PEPCID) 40 MG tablet Take 1 tablet (40 mg total) by mouth daily.  ? fenofibrate micronized (LOFIBRA) 134 MG capsule Take  1 capsule  Daily  for Triglycerides (Blood Fats)  ? meloxicam (MOBIC) 15 MG tablet Take       1/2 to 1 tablet        Daily       with Food for Pain & Inflammation  ? montelukast (SINGULAIR) 10 MG tablet Take  1 tablet  Daily  for Allergies  ? omeprazole (PRILOSEC) 20 MG capsule TAKE 1 CAPSULE BY MOUTH ONCE DAILY TO  PREVENT  HEARTBURN  AND  INDIGESTION  ? phentermine (ADIPEX-P) 37.5 MG tablet TAKE 1/2 TO 1 (ONE-HALF TO ONE) TABLET BY MOUTH ONCE DAILY IN THE MORNING FOR  DIETING  AND  WEIGHT  LOSS  ? promethazine-dextromethorphan (PROMETHAZINE-DM) 6.25-15 MG/5ML syrup Take 1 tsp every 4 hours if needed for cough  ? sildenafil (VIAGRA) 100 MG tablet TAKE ONE TABLET BY MOUTH ONE TIME DAILY AS NEEDED  ? tamsulosin (FLOMAX) 0.4 MG CAPS capsule TAKE 1 CAPSULE BY MOUTH AT BEDTIME FOR PROSTATE  ? valsartan-hydrochlorothiazide (DIOVAN-HCT) 80-12.5 MG tablet Take 1 tablet Daily for BP  ? Vitamin D, Ergocalciferol, (DRISDOL) 1.25 MG (50000 UNIT) CAPS capsule Take 50,000 Units by mouth every 7 (seven) days.  ? dexamethasone (DECADRON) 4 MG tablet Take 1 tab 3 x day - 3 days, then 2 x day - 3 days, then 1 tab daily (Patient not taking: Reported on 01/09/2022)  ? levofloxacin (LEVAQUIN) 500 MG tablet Take 1 tablet daily with food for infection (Patient not taking: Reported on 01/09/2022)  ? pseudoephedrine (SUDAFED) 120 MG 12 hr tablet Take  1 tablet  2 x /day (every 12 hours)  for Sinus & Chest Congestion (Patient not taking: Reported on 12/24/2021)  ? ?No current facility-administered medications on file prior to visit.  ? ? ?ROS: all negative except what is noted in the HPI.  ? ?Physical Exam: ? ?BP 122/64   Pulse (!) 57   Temp 97.7 ?F (36.5 ?C)   Wt 241 lb 12.8 oz (109.7 kg)   SpO2 96%   BMI 35.20 kg/m?  ? ?General Appearance: NAD.  Awake, conversant and cooperative. ?Eyes: PERRLA, EOMs intact.  Sclera white.  Conjunctiva without erythema. ?Sinuses: No frontal/maxillary tenderness.  No nasal discharge. Nares patent.  ?ENT/Mouth: Ext aud canals clear.  Bilateral TMs w/DOL and without erythema or bulging. Hearing  intact.  Posterior pharynx without swelling or exudate.  Tonsils without swelling or erythema.  ?Neck: Supple.  No masses, nodules or thyromegaly. ?Respiratory: Right middle lobe expiratory wheezing along with diminished right posterior lung sounds.  Effort is regular with non-labored breathing.  ?Cardio: RRR with no MRGs. Brisk peripheral pulses without edema.  ?Abdomen: Active BS in all four quadrants.  Soft and non-tender without guarding, rebound tenderness, hernias or masses. ?Lymphatics: Non tender without lymphadenopathy.  ?Musculoskeletal: Full ROM, 5/5 strength, normal ambulation.  No clubbing or cyanosis. ?Skin: Appropriate color for ethnicity. Warm without rashes, lesions, ecchymosis, ulcers.  ?Neuro: CN II-XII grossly normal. Normal muscle tone without cerebellar symptoms and intact sensation.   ?  Psych: AO X 3,  appropriate mood and affect, insight and judgment.  ?  ? ?Darrol Jump, NP ?11:37 AM ?[facility] ? ?

## 2022-01-10 ENCOUNTER — Encounter: Payer: Self-pay | Admitting: Nurse Practitioner

## 2022-01-12 ENCOUNTER — Telehealth: Payer: Self-pay | Admitting: Nurse Practitioner

## 2022-01-12 ENCOUNTER — Encounter: Payer: Self-pay | Admitting: Nurse Practitioner

## 2022-01-12 NOTE — Telephone Encounter (Signed)
Made in error, please disregard.

## 2022-01-13 DIAGNOSIS — R052 Subacute cough: Secondary | ICD-10-CM | POA: Diagnosis not present

## 2022-01-13 DIAGNOSIS — J0191 Acute recurrent sinusitis, unspecified: Secondary | ICD-10-CM | POA: Diagnosis not present

## 2022-01-15 DIAGNOSIS — G4733 Obstructive sleep apnea (adult) (pediatric): Secondary | ICD-10-CM | POA: Diagnosis not present

## 2022-01-22 ENCOUNTER — Other Ambulatory Visit: Payer: Self-pay | Admitting: Nurse Practitioner

## 2022-01-22 DIAGNOSIS — I10 Essential (primary) hypertension: Secondary | ICD-10-CM

## 2022-01-26 DIAGNOSIS — J341 Cyst and mucocele of nose and nasal sinus: Secondary | ICD-10-CM | POA: Diagnosis not present

## 2022-01-26 DIAGNOSIS — J329 Chronic sinusitis, unspecified: Secondary | ICD-10-CM | POA: Diagnosis not present

## 2022-01-26 DIAGNOSIS — J0191 Acute recurrent sinusitis, unspecified: Secondary | ICD-10-CM | POA: Diagnosis not present

## 2022-02-03 ENCOUNTER — Encounter: Payer: Self-pay | Admitting: Internal Medicine

## 2022-02-10 DIAGNOSIS — M71349 Other bursal cyst, unspecified hand: Secondary | ICD-10-CM | POA: Diagnosis not present

## 2022-02-10 DIAGNOSIS — M65331 Trigger finger, right middle finger: Secondary | ICD-10-CM | POA: Diagnosis not present

## 2022-02-11 ENCOUNTER — Telehealth: Payer: Self-pay

## 2022-02-11 NOTE — Telephone Encounter (Signed)
LM-02/11/22-Called pt. To r/s CP visit d/t provider not available. Unable to reach patient. [place].  Total time spent: 2 min.   LM-02/11/22-Pt. Returned call and spoke w/ another HC. The following message was fwd to me.  [1:09 PM] George Montgomery   Hey George Montgomery this is George Montgomery with the patient assistance line. I got a call from a George Montgomery 12/10/1955 stating he wanted to cancel all appointments and didn't want to be in the program. Unfortunately he did not give me time to get him to the right place.

## 2022-02-15 DIAGNOSIS — G4733 Obstructive sleep apnea (adult) (pediatric): Secondary | ICD-10-CM | POA: Diagnosis not present

## 2022-02-18 ENCOUNTER — Encounter: Payer: Self-pay | Admitting: Internal Medicine

## 2022-02-18 ENCOUNTER — Ambulatory Visit (INDEPENDENT_AMBULATORY_CARE_PROVIDER_SITE_OTHER): Payer: Medicare HMO | Admitting: Internal Medicine

## 2022-02-18 VITALS — BP 140/70 | HR 61 | Temp 97.9°F | Resp 16 | Ht 69.5 in | Wt 243.4 lb

## 2022-02-18 DIAGNOSIS — Z79899 Other long term (current) drug therapy: Secondary | ICD-10-CM

## 2022-02-18 DIAGNOSIS — Z125 Encounter for screening for malignant neoplasm of prostate: Secondary | ICD-10-CM

## 2022-02-18 DIAGNOSIS — I700 Atherosclerosis of aorta: Secondary | ICD-10-CM | POA: Diagnosis not present

## 2022-02-18 DIAGNOSIS — E1122 Type 2 diabetes mellitus with diabetic chronic kidney disease: Secondary | ICD-10-CM

## 2022-02-18 DIAGNOSIS — E782 Mixed hyperlipidemia: Secondary | ICD-10-CM | POA: Diagnosis not present

## 2022-02-18 DIAGNOSIS — E559 Vitamin D deficiency, unspecified: Secondary | ICD-10-CM | POA: Diagnosis not present

## 2022-02-18 DIAGNOSIS — Z0000 Encounter for general adult medical examination without abnormal findings: Secondary | ICD-10-CM | POA: Diagnosis not present

## 2022-02-18 DIAGNOSIS — I10 Essential (primary) hypertension: Secondary | ICD-10-CM | POA: Diagnosis not present

## 2022-02-18 DIAGNOSIS — Z0001 Encounter for general adult medical examination with abnormal findings: Secondary | ICD-10-CM

## 2022-02-18 DIAGNOSIS — N138 Other obstructive and reflux uropathy: Secondary | ICD-10-CM | POA: Diagnosis not present

## 2022-02-18 DIAGNOSIS — Z136 Encounter for screening for cardiovascular disorders: Secondary | ICD-10-CM

## 2022-02-18 DIAGNOSIS — E8881 Metabolic syndrome: Secondary | ICD-10-CM

## 2022-02-18 DIAGNOSIS — G4733 Obstructive sleep apnea (adult) (pediatric): Secondary | ICD-10-CM

## 2022-02-18 DIAGNOSIS — R7309 Other abnormal glucose: Secondary | ICD-10-CM | POA: Diagnosis not present

## 2022-02-18 DIAGNOSIS — N401 Enlarged prostate with lower urinary tract symptoms: Secondary | ICD-10-CM | POA: Diagnosis not present

## 2022-02-18 DIAGNOSIS — Z6836 Body mass index (BMI) 36.0-36.9, adult: Secondary | ICD-10-CM | POA: Diagnosis not present

## 2022-02-18 DIAGNOSIS — E785 Hyperlipidemia, unspecified: Secondary | ICD-10-CM

## 2022-02-18 DIAGNOSIS — Z1211 Encounter for screening for malignant neoplasm of colon: Secondary | ICD-10-CM

## 2022-02-18 MED ORDER — TOPIRAMATE 50 MG PO TABS: ORAL_TABLET | ORAL | 1 refills | Status: DC

## 2022-02-18 NOTE — Patient Instructions (Signed)

## 2022-02-18 NOTE — Progress Notes (Addendum)
? ?                           Annual  Screening/Preventative Visit  ?                          & Comprehensive Evaluation  ?                                & Examination ? ?Future Appointments  ?Date Time Provider Department  ?02/18/2022  3:00 PM Unk Pinto, MD [facility]  ?02/24/2022  9:30 AM Garnet Sierras, DO [facility]  ?07/15/2022     Wellness   11:00 AM Magda Bernheim, NP [facility]  ?09/29/2022  8:45 AM Debbora Presto, NP [facility]  ?02/23/2023  3:00 PM Unk Pinto, MD [facility]  ? ?    ?     This very nice 66 y.o.male presents for a Screening /Preventative Visit & comprehensive evaluation and management of multiple medical co-morbidities.  Patient has been followed for HTN, HLD, T2_NIDDM  and Vitamin D Deficiency. Chest CT scan in Oct 2020 showed Aortic Atherosclerosis.  Patient's GERD is controlled on his Omeprazole.  Patient is followed by Dr Elsworth Soho for OSA on Bipap with improved sleep hygiene . ? ? ?    HTN predates since  2015.   Patient's BP has been controlled on Bisoprolol  and today's BP is at goal - 140/70. Patient denies any cardiac symptoms as chest pain, palpitations, shortness of breath, dizziness or ankle swelling. ? ? ?    Patient's hyperlipidemia is controlled with diet and Fenofibrate Patient denies myalgias or other medication SE's. Last lipids were at goal : ? ?Lab Results  ?Component Value Date  ? CHOL 142 10/31/2021  ? HDL 34 (L) 10/31/2021  ? [place] 86 10/31/2021  ? TRIG 127 10/31/2021  ? CHOLHDL 4.2 10/31/2021  ? ? ? ?    Patient has Morbid Obesity (BMI 36.7+) and consequent prediabetes /Insulin Resistance   (A1c 5.6%  Betsey Amen. insulin 29  /Jan 2016) and patient denies reactive hypoglycemic symptoms, visual blurring, diabetic polys or paresthesias. Last A1c was not at goal . And today's A1c is 6.5% - officially Diabetic: ?  ?Lab Results  ?Component Value Date  ? HGBA1C 6.1 (H) 10/31/2021  ?  ? ? ?    Finally, patient has history of Vitamin D Deficiency ("21" /2016) and last vitamin D was still  low : ?  ?Lab Results  ?Component Value Date  ? VD25OH 35 10/31/2021  ? ? ? ?Current Outpatient Medications on File Prior to Visit  ?Medication Sig  ? albuterol HFA inhaler Use2 Inhalations every  4 hours as needed to rescue Asthma  ? bisoprolol 10 MG tablet Take 1 tablet  daily   ? cetirizine  10 MG tablet Take 1 tablet  daily.  ? EPINEPHrine 0.3 mg/0.3 mL  injec Inject 0.3 mg into the muscle as needed for anaphylaxis.  ? famotidine 40 MG tablet Take 1 tablet daily.  ? fenofibrate  134 MG capsule Take  1 capsule  Daily    ? meloxicam 15 MG tablet Take  1/2 to 1 tablet Daily    ? montelukast  10 MG tablet Take  1 tablet  Daily    ? omeprazole 20 MG capsule TAKE 1 CAPSULE DAILY   ? phentermine ( 37.5 MG tablet TAKE  1/2 TO 1 TABLET DAILY   ? sildenafil  100 MG tablet TAKE ONE TABLET DAILY AS NEEDED  ? tamsulosin  0.4 MG CAPS capsule TAKE 1 CAPSULE AT BEDTIME   ? valsartan-HCT 80-12.5 MG tablet Take 1 tablet Daily for BP  ? Vitamin D 50,000 u cap Take 50,000 Units every 7  days.  ? ? ?Allergies  ?Allergen Reactions  ? Bee Venom Anaphylaxis  ? Pine Anaphylaxis  ? Peanut-Containing Drug Products Itching  ? Lisinopril Other (See Comments)  ?  Patient intolerant of drug which caused his tongue to tingle.  ? Celebrex [Celecoxib]   ? ? ? ?Past Medical History:  ?Diagnosis Date  ? Arthritis   ? Asthma   ? Back pain   ? Chronic pain of both shoulders   ? Depression   ? Dyspnea   ? Elevated glucose   ? Environmental allergies   ? GERD (gastroesophageal reflux disease)   ? HLD (hyperlipidemia)   ? HTN (hypertension)   ? Insomnia   ? Obesity   ? OSA on CPAP 08/01/2014  ? Reflux   ? Trigger finger   ? ? ? ?Health Maintenance  ?Topic Date Due  ? Zoster Vaccines- Shingrix (1 of 2) Never done  ? COVID-19 Vaccine (4 - Booster) 10/22/2020  ? Hepatitis C Screening  07/15/2022 (Originally 01/18/1974)  ? INFLUENZA VACCINE  05/19/2022  ? TETANUS/TDAP  11/06/2026  ? Pneumonia Vaccine 46+ Years old  Completed  ? HPV VACCINES  Aged Out   ? ? ? ?Immunization History  ?Administered Date(s) Administered  ? Influenza Inj Mdck Quad  08/19/2017, 07/25/2019  ? Influenza Split 08/06/2015  ? Influenza, High Dose  08/07/2020, 07/15/2021  ? Influenza,inj,quad 08/20/2016, 07/20/2019  ? Moderna Sars-Covid-2 Vacc 01/04/2020, 01/25/2020  ? PFIZER-SARS-COV-2 Vacc 08/27/2020  ? PNEUMOCOCCAL -20 10/31/2021  ? PPD Test 06/03/2018, 11/28/2019  ? Pneumococcal -23 08/20/2016  ? Tdap 11/06/2016  ? Zoster, Live 11/06/2016  ? ? ?Last Colon - 02/13/2015 -  Dr Henrietta Hoover - Recc 5 year f/u  - overdue Apr 2021.  ? ?Past Surgical History:  ?Procedure Laterality Date  ? APPENDECTOMY  07/2012  ? bone spur removal    ? HEMORRHOID SURGERY  09/2012  ? HERNIA REPAIR    ? ? ? ?Family History  ?Problem Relation Age of Onset  ? Heart disease Mother   ? Macular degeneration Mother   ? Hypertension Mother   ? Hyperlipidemia Mother   ? Depression Mother   ? Anxiety disorder Mother   ? Cancer - Colon Other   ? Cancer - Prostate Other   ? Liver disease Father   ? Alcoholism Father   ? Stroke Maternal Aunt   ? Cancer - Colon Paternal Uncle   ? Cancer - Prostate Paternal Uncle   ? ? ? ?Social History  ? ?Tobacco Use  ? Smoking status: Former  ?  Packs/day: 1.00  ?  Years: 40.00  ?  Pack years: 40.00  ?  Types: Cigarettes  ?  Quit date: 12/03/2012  ?  Years since quitting: 9.2  ? Smokeless tobacco: Never  ?Substance Use Topics  ? Alcohol use: Yes  ?  Alcohol/week: 2.0 standard drinks  ?  Types: 2 Standard drinks or equivalent per week  ?  Comment: occ  ? Drug use: No  ? ? ? ? ROS ?Constitutional: Denies fever, chills, weight loss/gain, headaches, insomnia,  night sweats or change in appetite. Does c/o fatigue. ?Eyes: Denies redness, blurred vision,  diplopia, discharge, itchy or watery eyes.  ?ENT: Denies discharge, congestion, post nasal drip, epistaxis, sore throat, earache, hearing loss, dental pain, Tinnitus, Vertigo, Sinus pain or snoring.  ?Cardio: Denies chest pain, palpitations, irregular  heartbeat, syncope, dyspnea, diaphoresis, orthopnea, PND, claudication or edema ?Respiratory: denies cough, dyspnea, DOE, pleurisy, hoarseness, laryngitis or wheezing.  ?Gastrointestinal: Denies dysphagia, heartburn, reflux, water brash, pain, cramps, nausea, vomiting, bloating, diarrhea, constipation, hematemesis, melena, hematochezia, jaundice or hemorrhoids ?Genitourinary: Denies dysuria, frequency, urgency, nocturia, hesitancy, discharge, hematuria or flank pain ?Musculoskeletal: Denies arthralgia, myalgia, stiffness, Jt. Swelling, pain, limp or strain/sprain. Denies Falls. ?Skin: Denies puritis, rash, hives, warts, acne, eczema or change in skin lesion ?Neuro: No weakness, tremor, incoordination, spasms, paresthesia or pain ?Psychiatric: Denies confusion, memory loss or sensory loss. Denies Depression. ?Endocrine: Denies change in weight, skin, hair change, nocturia, and paresthesia, diabetic polys, visual blurring or hyper / hypo glycemic episodes.  ?Heme/Lymph: No excessive bleeding, bruising or enlarged lymph nodes. ? ? ?Physical Exam ? ?BP 140/70   Pulse 61   Temp 97.9 ?F (36.6 ?C)   Resp 16   Ht 5' 9.5" (1.765 m)   Wt 243 lb 6.4 oz (110.4 kg)   SpO2 96%   BMI 35.43 kg/m?  ? ?General Appearance: Well nourished and well groomed and in no apparent distress. ? ?Eyes: PERRLA, EOMs, conjunctiva no swelling or erythema, normal fundi and vessels. ?Sinuses: No frontal/maxillary tenderness ?ENT/Mouth: EACs patent / TMs  nl. Nares clear without erythema, swelling, mucoid exudates. Oral hygiene is good. No erythema, swelling, or exudate. Tongue normal, non-obstructing. Tonsils not swollen or erythematous. Hearing normal.  ?Neck: Supple, thyroid not palpable. No bruits, nodes or JVD. ?Respiratory: Respiratory effort normal.  BS equal and clear bilateral without rales, rhonci, wheezing or stridor. ?Cardio: Heart sounds are normal with regular rate and rhythm and no murmurs, rubs or gallops. Peripheral pulses are  normal and equal bilaterally without edema. No aortic or femoral bruits. ?Chest: symmetric with normal excursions and percussion.  ?Abdomen: Soft, with Nl bowel sounds. Nontender, no guarding, rebound, hernias, masses,

## 2022-02-19 ENCOUNTER — Other Ambulatory Visit: Payer: Self-pay | Admitting: Internal Medicine

## 2022-02-19 ENCOUNTER — Ambulatory Visit: Payer: Medicare HMO | Admitting: Allergy

## 2022-02-19 LAB — CBC WITH DIFFERENTIAL/PLATELET
Absolute Monocytes: 585 cells/uL (ref 200–950)
Basophils Absolute: 39 cells/uL (ref 0–200)
Basophils Relative: 0.5 %
Eosinophils Absolute: 148 cells/uL (ref 15–500)
Eosinophils Relative: 1.9 %
HCT: 43.3 % (ref 38.5–50.0)
Hemoglobin: 14.7 g/dL (ref 13.2–17.1)
Lymphs Abs: 2449 cells/uL (ref 850–3900)
MCH: 31.7 pg (ref 27.0–33.0)
MCHC: 33.9 g/dL (ref 32.0–36.0)
MCV: 93.5 fL (ref 80.0–100.0)
MPV: 11 fL (ref 7.5–12.5)
Monocytes Relative: 7.5 %
Neutro Abs: 4579 cells/uL (ref 1500–7800)
Neutrophils Relative %: 58.7 %
Platelets: 199 10*3/uL (ref 140–400)
RBC: 4.63 10*6/uL (ref 4.20–5.80)
RDW: 12.8 % (ref 11.0–15.0)
Total Lymphocyte: 31.4 %
WBC: 7.8 10*3/uL (ref 3.8–10.8)

## 2022-02-19 LAB — COMPLETE METABOLIC PANEL WITH GFR
AG Ratio: 1.6 (calc) (ref 1.0–2.5)
ALT: 41 U/L (ref 9–46)
AST: 24 U/L (ref 10–35)
Albumin: 4.3 g/dL (ref 3.6–5.1)
Alkaline phosphatase (APISO): 61 U/L (ref 35–144)
BUN: 22 mg/dL (ref 7–25)
CO2: 29 mmol/L (ref 20–32)
Calcium: 9.3 mg/dL (ref 8.6–10.3)
Chloride: 105 mmol/L (ref 98–110)
Creat: 1.08 mg/dL (ref 0.70–1.35)
Globulin: 2.7 g/dL (calc) (ref 1.9–3.7)
Glucose, Bld: 127 mg/dL — ABNORMAL HIGH (ref 65–99)
Potassium: 4.2 mmol/L (ref 3.5–5.3)
Sodium: 142 mmol/L (ref 135–146)
Total Bilirubin: 0.6 mg/dL (ref 0.2–1.2)
Total Protein: 7 g/dL (ref 6.1–8.1)
eGFR: 75 mL/min/{1.73_m2} (ref >=60)

## 2022-02-19 LAB — MAGNESIUM: Magnesium: 2 mg/dL (ref 1.5–2.5)

## 2022-02-19 LAB — MICROALBUMIN / CREATININE URINE RATIO
Creatinine, Urine: 225 mg/dL (ref 20–320)
Microalb Creat Ratio: 3 mcg/mg creat (ref <30)
Microalb, Ur: 0.7 mg/dL

## 2022-02-19 LAB — PSA: PSA: 1.32 ng/mL (ref <=4.00)

## 2022-02-19 LAB — HEMOGLOBIN A1C
Hgb A1c MFr Bld: 6.5 % of total Hgb — ABNORMAL HIGH (ref <5.7)
Mean Plasma Glucose: 140 mg/dL
eAG (mmol/L): 7.7 mmol/L

## 2022-02-19 LAB — TSH: TSH: 1.13 mIU/L (ref 0.40–4.50)

## 2022-02-19 LAB — URINALYSIS, ROUTINE W REFLEX MICROSCOPIC
Bilirubin Urine: NEGATIVE
Glucose, UA: NEGATIVE
Hgb urine dipstick: NEGATIVE
Leukocytes,Ua: NEGATIVE
Nitrite: NEGATIVE
Protein, ur: NEGATIVE
Specific Gravity, Urine: 1.029 (ref 1.001–1.035)
pH: 5.5 (ref 5.0–8.0)

## 2022-02-19 LAB — LIPID PANEL
Cholesterol: 153 mg/dL (ref <200)
HDL: 32 mg/dL — ABNORMAL LOW (ref >=40)
LDL Cholesterol (Calc): 79 mg/dL (calc)
Non-HDL Cholesterol (Calc): 121 mg/dL (calc) (ref <130)
Total CHOL/HDL Ratio: 4.8 (calc) (ref <5.0)
Triglycerides: 352 mg/dL — ABNORMAL HIGH (ref <150)

## 2022-02-19 LAB — INSULIN, RANDOM: Insulin: 60.5 u[IU]/mL — ABNORMAL HIGH

## 2022-02-19 LAB — VITAMIN D 25 HYDROXY (VIT D DEFICIENCY, FRACTURES): Vit D, 25-Hydroxy: 53 ng/mL (ref 30–100)

## 2022-02-19 MED ORDER — TIRZEPATIDE 2.5 MG/0.5ML ~~LOC~~ SOAJ: 2.5000 mg | SUBCUTANEOUS | 0 refills | Status: DC

## 2022-02-19 MED ORDER — TRULICITY 1.5 MG/0.5ML ~~LOC~~ SOAJ: 1.5000 mg | SUBCUTANEOUS | 0 refills | Status: DC

## 2022-02-19 NOTE — Progress Notes (Signed)
<><><><><><><><><><><><><><><><><><><><><><><><><><><><><><><><><> ?<><><><><><><><><><><><><><><><><><><><><><><><><><><><><><><><><> ?- Test results slightly outside the reference range are not unusual. ?If there is anything important, I will review this with you,  ?otherwise it is considered normal test values.  ?If you have further questions,  ?please do not hesitate to contact me at the office or via My Chart.  ?<><><><><><><><><><><><><><><><><><><><><><><><><><><><><><><><><> ?<><><><><><><><><><><><><><><><><><><><><><><><><><><><><><><><><> ? ?-  Total Chol = 153           &        LDL Chol = 79    -       Both  Excellent  ? ?- Very low risk for Heart Attack  / Stroke ?<><><><><><><><><><><><><><><><><><><><><><><><><><><><><><><><><> ? ?-  But Triglycerides (  352   ) or fats in blood are too high  ?(goal is less than 150)   ? ?- Recommend avoid fried & greasy foods,  sweets / candy,  ? ?- Avoid white rice  ?(brown or wild rice or Quinoa is OK),  ? ?- Avoid white potatoes  ?(sweet potatoes are OK)  ? ?- Avoid anything made from white flour  ?- bagels, doughnuts, rolls, buns, biscuits, white and  ? ?wheat breads, pizza crust and traditional  ?pasta made of white flour & egg white ? ?- (vegetarian pasta or spinach or wheat pasta is OK).   ? ?- Multi-grain bread is OK - like multi-grain flat bread or ? sandwich thins.  ? ?- Avoid alcohol in excess.  ? ?- Exercise is also important. ?<><><><><><><><><><><><><><><><><><><><><><><><><><><><><><><><><> ? ?-  A1c = 6.5% - Now in Diabetic Range & will sent in week;y shote as discussed  ? ? ?Being diabetic has a  300% increased risk for heart attack,  ?                                                  stroke, cancer, and alzheimer- type vascular dementia.  ? ?It is very important that you work harder with diet by avoiding  ?                                                all foods that are white except chicken, fish & calliflower. ? ?- Avoid white rice  ?(brown &  wild rice is OK),  ? ?- Avoid white potatoes  ?(sweet potatoes in moderation is OK),  ? ?White bread or wheat bread or anything made out of  ? ?white flour like bagels, donuts, rolls, buns, biscuits, cakes, ? ?- pastries, cookies, pizza crust, and pasta (made from  ?white flour & egg whites)  ? ?- vegetarian pasta or spinach or wheat pasta is OK. ? ?- Multigrain breads like Arnold's, Pepperidge Farm or  ? ?multigrain sandwich thins or high fiber breads like  ? ?[place] bread or "Dave's Killer" breads that are  ?4 to 5 grams fiber per slice !  are best.   ? ?Diet, exercise and weight loss can reverse and cure  ?diabetes in the early stages.   ? ?- Diet, exercise and weight loss is very important in the  ? ?control and prevention of complications of diabetes which ? affects every system in your body, ie.  ? ?-Brain - dementia/stroke,  ?- eyes - glaucoma/blindness,  ?- heart - heart  attack/heart failure,  ?- kidneys - dialysis,  ?- stomach - gastric paralysis,  ?- intestines - malabsorption,  ?- nerves - severe painful neuritis,  ?- circulation - gangrene & loss of a leg(s)  ?- and finally  . . . . . . . . . . . . . . . . . .   ? ?- cancer and Alzheimers. ?<><><><><><><><><><><><><><><><><><><><><><><><><><><><><><><><><> ? ?-  Insulin  = 60.5 is elevated  ( Normal is less than 20 )  shows insulin resistance  ? ?- a sign of early diabetes and associated with a  ?                  300 % greater risk for heart attacks, strokes, cancer &  ?                                                                                  Alzheimer type vascular dementia  ? ?- All this can be cured  and prevented with losing weight   !  ? ?- get Dr Fara Olden Fuhrman's book 'the End of Diabetes" and "the End of Dieting"  ?                                                           - and add many years of good health to your life ?<><><><><><><><><><><><><><><><><><><><><><><><><><><><><><><><><> ? ?-  PSA - Low   - Great    ! ?<><><><><><><><><><><><><><><><><><><><><><><><><><><><><><><><><> ? ?-  All Else - CBC - Kidneys - Electrolytes - Liver - Magnesium & Thyroid   ? ?- all  Normal / OK ?<><><><><><><><><><><><><><><><><><><><><><><><><><><><><><><><><> ?<><><><><><><><><><><><><><><><><><><><><><><><><><><><><><><><><> ? ? ?-   ? ? ? ? ? ?

## 2022-02-23 NOTE — Progress Notes (Signed)
? ?New Patient Note ? ?RE: George Montgomery MRN: [id number] DOB: 06/05/1956 ?Date of Office Visit: 02/24/2022 ? ?Consult requested by: Unk Pinto, MD ?Primary care provider: Unk Pinto, MD ? ?Chief Complaint: Allergies (Sick since March) and Wheezing ? ?History of Present Illness: ?I had the pleasure of seeing George Montgomery for initial evaluation at the [facility] on 02/24/2022. He is a 66 y.o. male, who is referred here by [facility] for the evaluation of allergies and wheezing. He is accompanied today by his spouse who provided/contributed to the history.  ? ?He reports symptoms of PND, coughing, rhinorrhea, nasal congestion, sneezing, itchy/watery. Symptoms have been going on for many years. The symptoms are present mainly in the spring and fall. Other triggers include exposure to outdoors. Anosmia: no. Headache: no. He has used Singulair, zyrtec, Flonase with fair improvement in symptoms. Sinus infections: once per year. Previous work up includes: none. ?Previous ENT evaluation: yes. ?Previous sinus imaging: 01/26/2022 CT sinus. ?History of nasal polyps: no. ?Last eye exam: once per year. ?History of reflux: yes and take omeprazole '20mg'$  daily. ? ?Patient went to see his PCP on March 8th due to PND, coughing, rhinorrhea. ?He was treated with 3 rounds of antibiotics and 2 rounds of prednisone and finally feeling better this week. ? ?He reports symptoms of shortness of breath, coughing, wheezing for many years. Current medications include albuterol prn which help. He reports not using aerochamber with inhalers. He tried the following inhalers: Trelegy. Main triggers are outdoors. In the last month, frequency of symptoms: depends. Frequency of nocturnal symptoms: 0x/month. Frequency of SABA use: depends. Interference with physical activity: no. Sleep is undisturbed. In the last 12 months, emergency room visits/urgent care visits/doctor office visits or hospitalizations due to respiratory  issues: 2. In the last 12 months, oral steroids courses: 2. Lifetime history of hospitalization for respiratory issues: no. Prior intubations: no. History of pneumonia: yes several times - last time was in 2005. He was evaluated by pulmonologist in the past. Smoking exposure: quit. Up to date with flu vaccine: yes. Up to date with pneumonia vaccine: yes. Up to date with COVID-19 vaccine: yes. Prior Covid-19 infection: no. ? ?01/26/2022 CT sinus: ?"IMPRESSION:  ?Small amount of layering fluid in the right maxillary sinus without  ?advanced or widespread mucosal thickening. Infundibulum appears  ?patent.  ? ?Mild mucosal thickening of the left maxillary sinus floor with a  ?retention cyst. Infundibulum is obscured by mucosal thickening.  ? ?Otherwise negative." ? ?01/09/2022 CXR: ?"IMPRESSION: ?No evidence of acute cardiopulmonary disease" ? ?Assessment and Plan: ?George Montgomery is a 66 y.o. male with: ?Other allergic rhinitis ?Perennial rhinoconjunctivitis symptoms for many years mainly in the spring and fall.  Tried Singulair, Zyrtec and Flonase with some benefit.  No prior allergy/ENT evaluation.  2023 CT sinus showed mild mucosal thickening. ?Today's skin testing showed: Positive to dust mites, mold and cockroach. ?Patient would like to get bloodwork as the pollens did not show up on skin testing today.  ?Start environmental control measures as below. ?Continue Singulair (montelukast) '10mg'$  daily at night. ?Use over the counter antihistamines such as Zyrtec (cetirizine), Claritin (loratadine), Allegra (fexofenadine), or Xyzal (levocetirizine) daily as needed. May take twice a day during allergy flares. May switch antihistamines every few months. ?Use Flonase (fluticasone) nasal spray 1 spray per nostril twice a day as needed for nasal congestion.  ?Use azelastine nasal spray 1-2 sprays per nostril twice a day as needed for runny nose/drainage. ?Nasal saline spray (i.e., Simply  Saline) or nasal saline lavage (i.e., NeilMed) is  recommended as needed and prior to medicated nasal sprays. ?Consider allergy injections for long term control if above medications do not help the symptoms - handout given.  ? ?Recurrent infections ?Sinus infections once per year but this time it took 3 rounds of antibiotics and 2 courses of prednisone to clear. History of pneumonias. Recently diagnosed with diabetes. No prior immune evaluation. ?Keep track of infections and antibiotics use. ?Get bloodwork to look at immune system. ? ?Mild intermittent reactive airway disease ?Gets wheezing, coughing and shortness of breath mainly during URIs. Uses albuterol rarely but during the last URI needed to add on Trelegy for 2 weeks. 2023 CXR unremarkable. ?Today's spirometry showed: some restrictive disease (most likely due to body habitus) with no improvement in FEV1 post bronchodilator treatment. Clinically feeling slightly improved.  ?May have some URI induced RAD. ?During upper respiratory infections/flares:  ?Start Breo 22mg 1 puff once a day and rinse mouth after each use for 1-2 weeks until your breathing symptoms return to baseline.  ?May use albuterol rescue inhaler 2 puffs every 4 to 6 hours as needed for shortness of breath, chest tightness, coughing, and wheezing. Monitor frequency of use. ? ?Hymenoptera allergy ?Went to ER few times after stings due to hives and swelling. Never required Epi and no prior work up. ?Continue to avoid. ?For mild symptoms you can take over the counter antihistamines such as Benadryl and monitor symptoms closely. If symptoms worsen or if you have severe symptoms including breathing issues, throat closure, significant swelling, whole body hives, severe diarrhea and vomiting, lightheadedness then inject epinephrine and seek immediate medical care afterwards. ?Action plan given. ?Get bloodwork ?If positive, will recommend allergy immunotherapy given history of reactions. ?If negative, will discuss doing the second part of the test.   ? ?Anaphylactic reaction due to food, subsequent encounter ?Upper body itching after eating mixed nuts in December 2023. Has eaten peanut butter since then with no issues. ?Today's skin testing showed: Positive to bBolivianuts. ?Will get bloodwork as well to double check the other tree nuts that were negative on skin testing.  ?Start strict avoidance of bBolivianuts. ?Be careful about cross contamination when eating other tree nuts. ?Okay with eating peanuts.  ? ?Return in about 3 months (around 05/27/2022). ? ?Meds ordered this encounter  ?Medications  ? azelastine (ASTELIN) 0.1 % nasal spray  ?  Sig: Place 1-2 sprays into both nostrils 2 (two) times daily as needed (nasal drainage). Use in each nostril as directed  ?  Dispense:  30 mL  ?  Refill:  5  ? fluticasone furoate-vilanterol (BREO ELLIPTA) 200-25 MCG/ACT AEPB  ?  Sig: Inhale 1 puff into the lungs daily. Use at the first sign of upper respiratory infections for 1-2 weeks. Rinse mouth after each use.  ?  Dispense:  60 each  ?  Refill:  2  ? EPINEPHrine (EPIPEN 2-PAK) 0.3 mg/0.3 mL IJ SOAJ injection  ?  Sig: Inject 0.3 mg into the muscle as needed for anaphylaxis.  ?  Dispense:  2 each  ?  Refill:  2  ?  DISPENSE MYLAN OR TEVA ONLY  ? ?Lab Orders    ?     Tryptase    ?     Allergen Hymenoptera Panel    ?     CBC with Differential/Platelet    ?     Complement, total    ?     Strep pneumoniae 23  Serotypes IgG    ?     IgG, IgA, IgM    ?     IgE Nut Prof. w/Component Rflx    ?     Diphtheria / Tetanus Antibody Panel    ?     Allergens, Zone 2    ? ? ?Other allergy screening: ?Food allergy: yes ?Last December patient noted upper body itching after eating a few handful peanuts/tree nuts.  ?No issues with peanut butter. He has not eaten any tree nuts since then. ? ?Dietary History: patient has been eating other foods including milk, eggs, sesame, shellfish, fish, soy, wheat, meats, fruits and vegetables. ? ?Medication allergy: yes ?Hymenoptera allergy: whole body  hives and swelling after being stung. ?Been to the ER a few times after stings. Has Epipen with no prior use. ?No prior testing.  ? ?Urticaria: no ?Eczema:no ?History of recurrent infections suggestive of immu

## 2022-02-24 ENCOUNTER — Ambulatory Visit: Payer: Medicare HMO | Admitting: Allergy

## 2022-02-24 ENCOUNTER — Other Ambulatory Visit: Payer: Self-pay

## 2022-02-24 ENCOUNTER — Encounter: Payer: Self-pay | Admitting: Allergy

## 2022-02-24 VITALS — BP 114/58 | HR 56 | Temp 98.2°F

## 2022-02-24 DIAGNOSIS — J3089 Other allergic rhinitis: Secondary | ICD-10-CM

## 2022-02-24 DIAGNOSIS — B999 Unspecified infectious disease: Secondary | ICD-10-CM

## 2022-02-24 DIAGNOSIS — Z91038 Other insect allergy status: Secondary | ICD-10-CM

## 2022-02-24 DIAGNOSIS — J4520 Mild intermittent asthma, uncomplicated: Secondary | ICD-10-CM | POA: Diagnosis not present

## 2022-02-24 DIAGNOSIS — T7800XD Anaphylactic reaction due to unspecified food, subsequent encounter: Secondary | ICD-10-CM

## 2022-02-24 DIAGNOSIS — J988 Other specified respiratory disorders: Secondary | ICD-10-CM | POA: Insufficient documentation

## 2022-02-24 DIAGNOSIS — T7800XA Anaphylactic reaction due to unspecified food, initial encounter: Secondary | ICD-10-CM

## 2022-02-24 DIAGNOSIS — J302 Other seasonal allergic rhinitis: Secondary | ICD-10-CM | POA: Insufficient documentation

## 2022-02-24 MED ORDER — EPINEPHRINE 0.3 MG/0.3ML IJ SOAJ: 0.3000 mg | INTRAMUSCULAR | 2 refills | Status: DC | PRN

## 2022-02-24 MED ORDER — FLUTICASONE FUROATE-VILANTEROL 200-25 MCG/ACT IN AEPB: 1.0000 | INHALATION_SPRAY | Freq: Every day | RESPIRATORY_TRACT | 2 refills | Status: DC

## 2022-02-24 MED ORDER — AZELASTINE HCL 0.1 % NA SOLN: 1.0000 | Freq: Two times a day (BID) | NASAL | 5 refills | Status: DC | PRN

## 2022-02-24 NOTE — Assessment & Plan Note (Signed)
Sinus infections once per year but this time it took 3 rounds of antibiotics and 2 courses of prednisone to clear. History of pneumonias. Recently diagnosed with diabetes. No prior immune evaluation. ?? Keep track of infections and antibiotics use. ?? Get bloodwork to look at immune system. ?

## 2022-02-24 NOTE — Assessment & Plan Note (Signed)
Upper body itching after eating mixed nuts in December 2023. Has eaten peanut butter since then with no issues. ?? Today's skin testing showed: Positive to [place] nuts. ?? Will get bloodwork as well to double check the other tree nuts that were negative on skin testing.  ?? Start strict avoidance of [place] nuts. ?? Be careful about cross contamination when eating other tree nuts. ?? Okay with eating peanuts.  ?

## 2022-02-24 NOTE — Assessment & Plan Note (Signed)
Perennial rhinoconjunctivitis symptoms for many years mainly in the spring and fall.  Tried Singulair, Zyrtec and Flonase with some benefit.  No prior allergy/ENT evaluation.  2023 CT sinus showed mild mucosal thickening. ?? Today's skin testing showed: Positive to dust mites, mold and cockroach. ?? Patient would like to get bloodwork as the pollens did not show up on skin testing today.  ?? Start environmental control measures as below. ?? Continue Singulair (montelukast) '10mg'$  daily at night. ?? Use over the counter antihistamines such as Zyrtec (cetirizine), Claritin (loratadine), Allegra (fexofenadine), or Xyzal (levocetirizine) daily as needed. May take twice a day during allergy flares. May switch antihistamines every few months. ?? Use Flonase (fluticasone) nasal spray 1 spray per nostril twice a day as needed for nasal congestion.  ?? Use azelastine nasal spray 1-2 sprays per nostril twice a day as needed for runny nose/drainage. ?? Nasal saline spray (i.e., Simply Saline) or nasal saline lavage (i.e., NeilMed) is recommended as needed and prior to medicated nasal sprays. ?? Consider allergy injections for long term control if above medications do not help the symptoms - handout given.  ?

## 2022-02-24 NOTE — Patient Instructions (Addendum)
Today's skin testing showed: ?Positive to dust mites, mold and cockroach. ?Positive to [place] nuts. ? ?Results given. ? ?Environmental allergies ?Start environmental control measures as below. ?Continue Singulair (montelukast) '10mg'$  daily at night. ?Use over the counter antihistamines such as Zyrtec (cetirizine), Claritin (loratadine), Allegra (fexofenadine), or Xyzal (levocetirizine) daily as needed. May take twice a day during allergy flares. May switch antihistamines every few months. ?Use Flonase (fluticasone) nasal spray 1 spray per nostril twice a day as needed for nasal congestion.  ?Use azelastine nasal spray 1-2 sprays per nostril twice a day as needed for runny nose/drainage. ?Nasal saline spray (i.e., Simply Saline) or nasal saline lavage (i.e., NeilMed) is recommended as needed and prior to medicated nasal sprays. ? ?Consider allergy injections for long term control if above medications do not help the symptoms - handout given.  ? ?Breathing ?During upper respiratory infections/flares:  ?Start Breo 254mg 1 puff once a day and rinse mouth after each use for 1-2 weeks until your breathing symptoms return to baseline.  ?May use albuterol rescue inhaler 2 puffs every 4 to 6 hours as needed for shortness of breath, chest tightness, coughing, and wheezing. Monitor frequency of use.  ?Breathing control goals:  ?Full participation in all desired activities (may need albuterol before activity) ?Albuterol use two times or less a week on average (not counting use with activity) ?Cough interfering with sleep two times or less a month ?Oral steroids no more than once a year ?No hospitalizations ? ?Food ?Start strict avoidance of bBolivianuts. ?Be careful about cross contamination when eating other tree nuts. ?Okay with eating peanuts.  ?Check other tree nuts via bloodwork. ? ?Bee stings ?Continue to avoid. ?For mild symptoms you can take over the counter antihistamines such as Benadryl and monitor symptoms closely. If  symptoms worsen or if you have severe symptoms including breathing issues, throat closure, significant swelling, whole body hives, severe diarrhea and vomiting, lightheadedness then inject epinephrine and seek immediate medical care afterwards. ?Action plan given. ?Get bloodwork ?If positive, will recommend allergy immunotherapy given history of reactions. ?If negative, will discuss doing the second part of the test.  ?We are ordering labs, so please allow 1-2 weeks for the results to come back. ?With the newly implemented Cures Act, the labs might be visible to you at the same time that they become visible to me. However, I will not address the results until all of the results are back, so please be patient.  ?In the meantime, continue recommendations in your patient instructions, including avoidance measures (if applicable), until you hear from me. ? ?Infections ?Keep track of infections and antibiotics use. ?Get bloodwork to look at immune system. ? ?Follow up in 3 months or sooner if needed.   ? ?Control of House Dust Mite Allergen ?Dust mite allergens are a common trigger of allergy and asthma symptoms. While they can be found throughout the house, these microscopic creatures thrive in warm, humid environments such as bedding, upholstered furniture and carpeting. ?Because so much time is spent in the bedroom, it is essential to reduce mite levels there.  ?Encase pillows, mattresses, and box springs in special allergen-proof fabric covers or airtight, zippered plastic covers.  ?Bedding should be washed weekly in hot water (130? F) and dried in a hot dryer. Allergen-proof covers are available for comforters and pillows that can?t be regularly washed.  ?Wash the allergy-proof covers every few months. Minimize clutter in the bedroom. Keep pets out of the bedroom.  ?Keep humidity less than  50% by using a dehumidifier or air conditioning. You can buy a humidity measuring device called a hygrometer to monitor this.   ?If possible, replace carpets with hardwood, linoleum, or washable area rugs. If that's not possible, vacuum frequently with a vacuum that has a HEPA filter. ?Remove all upholstered furniture and non-washable window drapes from the bedroom. ?Remove all non-washable stuffed toys from the bedroom.  Wash stuffed toys weekly. ?Mold Control ?Mold and fungi can grow on a variety of surfaces provided certain temperature and moisture conditions exist.  ?Outdoor molds grow on plants, decaying vegetation and soil. The major outdoor mold, Alternaria and Cladosporium, are found in very high numbers during hot and dry conditions. Generally, a late summer - fall peak is seen for common outdoor fungal spores. Rain will temporarily lower outdoor mold spore count, but counts rise rapidly when the rainy period ends. ?The most important indoor molds are Aspergillus and Penicillium. Dark, humid and poorly ventilated basements are ideal sites for mold growth. The next most common sites of mold growth are the bathroom and the kitchen. ?Outdoor (Seasonal) Mold Control ?Use air conditioning and keep windows closed. ?Avoid exposure to decaying vegetation. ?Avoid leaf raking. ?Avoid grain handling. ?Consider wearing a face mask if working in moldy areas.  ?Indoor (Perennial) Mold Control  ?Maintain humidity below 50%. ?Get rid of mold growth on hard surfaces with water, detergent and, if necessary, 5% bleach (do not mix with other cleaners). Then dry the area completely. If mold covers an area more than 10 square feet, consider hiring an indoor environmental professional. ?For clothing, washing with soap and water is best. If moldy items cannot be cleaned and dried, throw them away. ?Remove sources e.g. contaminated carpets. ?Repair and seal leaking roofs or pipes. Using dehumidifiers in damp basements may be helpful, but empty the water and clean units regularly to prevent mildew from forming. All rooms, especially basements, bathrooms and  kitchens, require ventilation and cleaning to deter mold and mildew growth. Avoid carpeting on concrete or damp floors, and storing items in damp areas. ?Cockroach Allergen Avoidance ?Cockroaches are often found in the homes of densely populated urban areas, schools or commercial buildings, but these creatures can lurk almost anywhere. This does not mean that you have a dirty house or living area. ?Block all areas where roaches can enter the home. This includes crevices, wall cracks and windows.  ?Cockroaches need water to survive, so fix and seal all leaky faucets and pipes. Have an exterminator go through the house when your family and pets are gone to eliminate any remaining roaches. ?Keep food in lidded containers and put pet food dishes away after your pets are done eating. Vacuum and sweep the floor after meals, and take out garbage and recyclables. Use lidded garbage containers in the kitchen. Wash dishes immediately after use and clean under stoves, refrigerators or toasters where crumbs can accumulate. Wipe off the stove and other kitchen surfaces and cupboards regularly. ? ?

## 2022-02-24 NOTE — Assessment & Plan Note (Signed)
Gets wheezing, coughing and shortness of breath mainly during URIs. Uses albuterol rarely but during the last URI needed to add on Trelegy for 2 weeks. 2023 CXR unremarkable. ?? Today's spirometry showed: some restrictive disease (most likely due to body habitus) with no improvement in FEV1 post bronchodilator treatment. Clinically feeling slightly improved.  ?? May have some URI induced RAD. ?? During upper respiratory infections/flares:  ?o Start Breo 270mg 1 puff once a day and rinse mouth after each use for 1-2 weeks until your breathing symptoms return to baseline.  ?? May use albuterol rescue inhaler 2 puffs every 4 to 6 hours as needed for shortness of breath, chest tightness, coughing, and wheezing. Monitor frequency of use. ?

## 2022-02-24 NOTE — Assessment & Plan Note (Signed)
Went to ER few times after stings due to hives and swelling. Never required Epi and no prior work up. ?? Continue to avoid. ?? For mild symptoms you can take over the counter antihistamines such as Benadryl and monitor symptoms closely. If symptoms worsen or if you have severe symptoms including breathing issues, throat closure, significant swelling, whole body hives, severe diarrhea and vomiting, lightheadedness then inject epinephrine and seek immediate medical care afterwards. ?? Action plan given. ?? Get bloodwork ?o If positive, will recommend allergy immunotherapy given history of reactions. ?o If negative, will discuss doing the second part of the test.  ?

## 2022-02-26 ENCOUNTER — Other Ambulatory Visit: Payer: Self-pay | Admitting: Internal Medicine

## 2022-02-26 ENCOUNTER — Other Ambulatory Visit: Payer: Self-pay | Admitting: Adult Health

## 2022-02-26 ENCOUNTER — Other Ambulatory Visit: Payer: Self-pay | Admitting: Nurse Practitioner

## 2022-02-26 DIAGNOSIS — N138 Other obstructive and reflux uropathy: Secondary | ICD-10-CM

## 2022-02-26 DIAGNOSIS — I10 Essential (primary) hypertension: Secondary | ICD-10-CM

## 2022-02-28 LAB — ALLERGENS, ZONE 2
Alternaria Alternata IgE: <0.1 kU/L
Amer Sycamore IgE Qn: <0.1 kU/L
Aspergillus Fumigatus IgE: <0.1 kU/L
Bahia Grass IgE: 0.19 kU/L — AB
Bermuda Grass IgE: 0.12 kU/L — AB
Cat Dander IgE: 0.14 kU/L — AB
Cedar, Mountain IgE: <0.1 kU/L
Cladosporium Herbarum IgE: <0.1 kU/L
Cockroach, American IgE: 0.3 kU/L — AB
Common Silver Birch IgE: <0.1 kU/L
D Farinae IgE: 0.64 kU/L — AB
D Pteronyssinus IgE: 0.83 kU/L — AB
Dog Dander IgE: <0.1 kU/L
Elm, American IgE: <0.1 kU/L
Hickory, White IgE: <0.1 kU/L
Johnson Grass IgE: 0.31 kU/L — AB
Maple/Box Elder IgE: <0.1 kU/L
Mucor Racemosus IgE: <0.1 kU/L
Mugwort IgE Qn: <0.1 kU/L
Nettle IgE: <0.1 kU/L
Oak, White IgE: 0.18 kU/L — AB
Penicillium Chrysogen IgE: <0.1 kU/L
Pigweed, Rough IgE: <0.1 kU/L
Plantain, English IgE: <0.1 kU/L
Ragweed, Short IgE: 0.66 kU/L — AB
Sheep Sorrel IgE Qn: 0.2 kU/L — AB
Stemphylium Herbarum IgE: <0.1 kU/L
Sweet gum IgE RAST Ql: <0.1 kU/L
Timothy Grass IgE: 0.42 kU/L — AB
White Mulberry IgE: <0.1 kU/L

## 2022-03-06 LAB — IGE NUT PROF. W/COMPONENT RFLX
F017-IgE Hazelnut (Filbert): <0.1 kU/L
F018-IgE Brazil Nut: 1.01 kU/L — AB
F020-IgE Almond: <0.1 kU/L
F202-IgE Cashew Nut: <0.1 kU/L
F203-IgE Pistachio Nut: 0.14 kU/L — AB
F256-IgE Walnut: 0.22 kU/L — AB
Macadamia Nut, IgE: <0.1 kU/L
Peanut, IgE: <0.1 kU/L
Pecan Nut IgE: 0.16 kU/L — AB

## 2022-03-06 LAB — STREP PNEUMONIAE 23 SEROTYPES IGG
Pneumo Ab Type 1*: 5.6 ug/mL (ref >1.3)
Pneumo Ab Type 12 (12F)*: 11.7 ug/mL (ref >1.3)
Pneumo Ab Type 14*: 5.5 ug/mL (ref >1.3)
Pneumo Ab Type 17 (17F)*: 0.5 ug/mL — ABNORMAL LOW (ref >1.3)
Pneumo Ab Type 19 (19F)*: 6.4 ug/mL (ref >1.3)
Pneumo Ab Type 2*: 10.2 ug/mL (ref >1.3)
Pneumo Ab Type 20*: 9.7 ug/mL (ref >1.3)
Pneumo Ab Type 22 (22F)*: 7.6 ug/mL (ref >1.3)
Pneumo Ab Type 23 (23F)*: >14.4 ug/mL (ref >1.3)
Pneumo Ab Type 26 (6B)*: >41.4 ug/mL (ref >1.3)
Pneumo Ab Type 3*: 1.9 ug/mL (ref >1.3)
Pneumo Ab Type 34 (10A)*: >19.5 ug/mL (ref >1.3)
Pneumo Ab Type 4*: 3.1 ug/mL (ref >1.3)
Pneumo Ab Type 43 (11A)*: >7.6 ug/mL (ref >1.3)
Pneumo Ab Type 5*: 6.7 ug/mL (ref >1.3)
Pneumo Ab Type 51 (7F)*: 3.5 ug/mL (ref >1.3)
Pneumo Ab Type 54 (15B)*: >22 ug/mL (ref >1.3)
Pneumo Ab Type 56 (18C)*: 7.6 ug/mL (ref >1.3)
Pneumo Ab Type 57 (19A)*: 31.1 ug/mL (ref >1.3)
Pneumo Ab Type 68 (9V)*: 2.5 ug/mL (ref >1.3)
Pneumo Ab Type 70 (33F)*: >10.1 ug/mL (ref >1.3)
Pneumo Ab Type 8*: 19.5 ug/mL (ref >1.3)
Pneumo Ab Type 9 (9N)*: 1.3 ug/mL — ABNORMAL LOW (ref >1.3)

## 2022-03-06 LAB — CBC WITH DIFFERENTIAL/PLATELET
Basophils Absolute: 0 10*3/uL (ref 0.0–0.2)
Basos: 1 %
EOS (ABSOLUTE): 0.2 10*3/uL (ref 0.0–0.4)
Eos: 4 %
Hematocrit: 37.8 % (ref 37.5–51.0)
Hemoglobin: 13.5 g/dL (ref 13.0–17.7)
Immature Grans (Abs): 0 10*3/uL (ref 0.0–0.1)
Immature Granulocytes: 0 %
Lymphocytes Absolute: 2.2 10*3/uL (ref 0.7–3.1)
Lymphs: 39 %
MCH: 32.9 pg (ref 26.6–33.0)
MCHC: 35.7 g/dL (ref 31.5–35.7)
MCV: 92 fL (ref 79–97)
Monocytes Absolute: 0.4 10*3/uL (ref 0.1–0.9)
Monocytes: 7 %
Neutrophils Absolute: 2.8 10*3/uL (ref 1.4–7.0)
Neutrophils: 49 %
Platelets: 168 10*3/uL (ref 150–450)
RBC: 4.1 x10E6/uL — ABNORMAL LOW (ref 4.14–5.80)
RDW: 12.6 % (ref 11.6–15.4)
WBC: 5.6 10*3/uL (ref 3.4–10.8)

## 2022-03-06 LAB — DIPHTHERIA / TETANUS ANTIBODY PANEL
Diphtheria Ab: >3 IU/mL (ref <0.10)
Tetanus Ab, IgG: 1.42 IU/mL (ref <0.10)

## 2022-03-06 LAB — ALLERGEN HYMENOPTERA PANEL
Bumblebee: 1.45 kU/L — AB
Honeybee IgE: 1.32 kU/L — AB
Hornet, White Face, IgE: 4.01 kU/L — AB
Hornet, Yellow, IgE: 2.91 kU/L — AB
Paper Wasp IgE: 6.97 kU/L — AB
Yellow Jacket, IgE: 11.1 kU/L — AB

## 2022-03-06 LAB — PANEL 604721
Jug R 1 IgE: 0.23 kU/L — AB
Jug R 3 IgE: <0.1 kU/L

## 2022-03-06 LAB — IGG, IGA, IGM
IgA/Immunoglobulin A, Serum: 285 mg/dL (ref 61–437)
IgG (Immunoglobin G), Serum: 951 mg/dL (ref 603–1613)
IgM (Immunoglobulin M), Srm: 51 mg/dL (ref 20–172)

## 2022-03-06 LAB — COMPLEMENT, TOTAL: Compl, Total (CH50): >60 U/mL (ref >41)

## 2022-03-06 LAB — ALLERGEN COMPONENT COMMENTS

## 2022-03-06 LAB — TRYPTASE: Tryptase: 5.8 ug/L (ref 2.2–13.2)

## 2022-03-06 LAB — PANEL 604350: Ber E 1 IgE: 1.05 kU/L — AB

## 2022-03-07 ENCOUNTER — Other Ambulatory Visit: Payer: Self-pay | Admitting: Internal Medicine

## 2022-03-07 MED ORDER — TRULICITY 3 MG/0.5ML ~~LOC~~ SOAJ: 3.0000 mg | SUBCUTANEOUS | 0 refills | Status: DC

## 2022-03-10 ENCOUNTER — Telehealth: Payer: Medicare HMO | Admitting: Pharmacy Technician

## 2022-03-17 DIAGNOSIS — G4733 Obstructive sleep apnea (adult) (pediatric): Secondary | ICD-10-CM | POA: Diagnosis not present

## 2022-03-18 ENCOUNTER — Encounter: Payer: Self-pay | Admitting: Internal Medicine

## 2022-03-24 NOTE — Progress Notes (Unsigned)
    Future Appointments  Date Time Provider Department  03/25/2022  2:30 PM Unk Pinto, MD [facility]  06/01/2022  8:30 AM Garnet Sierras, DO [facility]  07/15/2022 11:00 AM Alycia Rossetti, NP [facility]  09/29/2022  8:45 AM Debbora Presto, NP [facility]  10/14/2022  9:30 AM Unk Pinto, MD [facility]  02/23/2023  3:00 PM Unk Pinto, MD [facility]    History of Present Illness:      Medications    Dulaglutide (TRULICITY) 3 SA/6.3KZ , Inject 3 mg as directed once a week.    bisoprolol (ZEBETA) 10 MG tablet, Take 1 tablet by mouth once daily    EPIPEN 2-PAK)0.3 mg/0.3 mL inj, Inject 0.3 mg into the muscle  for anaphylaxis.   fenofibrate 134 MG capsule, TAKE 1 CAPSULE DAILY    sildenafil 100 MG tablet, TAKE ONE TABLET DAILY AS NEEDED   valsartan-hctz  80-12.5 MG tablet, Take 1 tablet daily for blood pressure   albuterol HFA inhaler, USE 2 INHALATIONS APART EVERY 4 HOURS AS NEEDED T   ASTELIN 0.1 % nasal spray, Place 1-2 sprays into  nostrils 2  times daily as needed    cetirizine  10 MG tablet, Take 1 tablet  daily.   BREO ELLIPTA 200-25, Inhale 1 puff  daily.   montelukast 10 MG tablet, Take  1 tablet  Daily  for Allergies   meloxicam 15 MG tablet, TAKE 1/2 TO 1  TABLET  DAILY   omeprazole  20 MG capsule, TAKE 1 CAPSULE  DAILY    phentermine 37.5 MG tablet, TAKE 1/2 TO 1 TABLET DAILY    tamsulosin 0.4 MG CAPS  TAKE 1 CAP  AT BEDTIME    topiramate 50 MG tablet, Take 1/2 to 1 tablet 2 x /day   Problem list He has Essential hypertension; Cluster headaches; OSA treated with BiPAP; Vitamin D deficiency; Testosterone deficiency; Morbid obesity ([place]); Hyperlipidemia, mixed; Screening for colorectal cancer; Dyspnea; Trigger finger, right ring finger; Bilateral hand pain; Depression; Chronic pain of both shoulders; Insomnia; Gastroesophageal reflux disease; Fatty liver; Benign prostatic hyperplasia (BPH) with post-void dribbling; Thoracic aortic atherosclerosis ([place]) by Chest CT scan  on10/21/2020.; Insulin resistance; Other allergic rhinitis; Anaphylactic reaction due to food, subsequent encounter; Recurrent infections; Hymenoptera allergy; and Mild intermittent reactive airway disease on their problem list.   Observations/Objective:  There were no vitals taken for this visit.  HEENT - WNL. Neck - supple.  Chest - Clear equal BS. Cor - Nl HS. RRR w/o sig MGR. PP 1(+). No edema. MS- FROM w/o deformities.  Gait Nl. Neuro -  Nl w/o focal abnormalities.   Assessment and Plan:      Follow Up Instructions:        I discussed the assessment and treatment plan with the patient. The patient was provided an opportunity to ask questions and all were answered. The patient agreed with the plan and demonstrated an understanding of the instructions.       The patient was advised to call back or seek an in-person evaluation if the symptoms worsen or if the condition fails to improve as anticipated.    Kirtland Bouchard, MD

## 2022-03-25 ENCOUNTER — Encounter: Payer: Self-pay | Admitting: Internal Medicine

## 2022-03-25 ENCOUNTER — Ambulatory Visit (INDEPENDENT_AMBULATORY_CARE_PROVIDER_SITE_OTHER): Payer: Medicare HMO | Admitting: Internal Medicine

## 2022-03-25 VITALS — BP 135/74 | HR 61 | Temp 96.9°F | Resp 18 | Wt 244.4 lb

## 2022-03-25 DIAGNOSIS — J40 Bronchitis, not specified as acute or chronic: Secondary | ICD-10-CM

## 2022-03-25 DIAGNOSIS — J329 Chronic sinusitis, unspecified: Secondary | ICD-10-CM | POA: Diagnosis not present

## 2022-03-25 MED ORDER — AZITHROMYCIN 250 MG PO TABS: ORAL_TABLET | ORAL | 1 refills | Status: DC

## 2022-03-25 MED ORDER — DEXAMETHASONE 4 MG PO TABS: ORAL_TABLET | ORAL | 0 refills | Status: DC

## 2022-03-25 MED ORDER — TRULICITY 1.5 MG/0.5ML ~~LOC~~ SOAJ: 1.5000 mg | SUBCUTANEOUS | Status: DC

## 2022-04-18 ENCOUNTER — Other Ambulatory Visit: Payer: Self-pay | Admitting: Internal Medicine

## 2022-04-25 ENCOUNTER — Other Ambulatory Visit: Payer: Self-pay | Admitting: Nurse Practitioner

## 2022-04-25 DIAGNOSIS — I10 Essential (primary) hypertension: Secondary | ICD-10-CM

## 2022-05-04 ENCOUNTER — Encounter: Payer: Self-pay | Admitting: Internal Medicine

## 2022-05-04 NOTE — Progress Notes (Unsigned)
Future Appointments  Date Time Provider Department  05/05/2022  9:30 AM Unk Pinto, MD [facility]  06/01/2022  8:30 AM Garnet Sierras, DO [facility]  07/15/2022                   wellness 11:00 AM Alycia Rossetti, NP [facility]  09/29/2022  8:45 AM Debbora Presto, NP [facility]  10/14/2022                 6 mo ov   9:30 AM Unk Pinto, MD [facility]  02/23/2023                     cpe   3:00 PM Unk Pinto, MD [facility]    History of Present Illness:                     This very nice 66 y.o.male  with HTN, HLD, T2_NIDDM,  GERD Aortic Atherosclerosis, OSA on Bipap and Vitamin D Deficiency  presents with c/o's & sx's of fatigue & depressed mood feeling overwhelmed with his self rum [employer].    Medications  Current Outpatient Medications (Endocrine & Metabolic):    TRULICITY 3 XQ/1.1HE SOPN, INJECT 3 MG AS DIRECTED ONCE A WEEK  Current Outpatient Medications (Cardiovascular):    bisoprolol (ZEBETA) 10 MG tablet, Take 1 tablet by mouth once daily for blood pressure   EPINEPHrine (EPIPEN 2-PAK) 0.3 mg/0.3 mL IJ SOAJ injection, Inject 0.3 mg into the muscle as needed for anaphylaxis.   fenofibrate micronized (LOFIBRA) 134 MG capsule, TAKE 1 CAPSULE BY MOUTH ONCE DAILY FOR  TRIGLYCERIDES  (BLOOD  FATS)   sildenafil (VIAGRA) 100 MG tablet, TAKE ONE TABLET BY MOUTH ONE TIME DAILY AS NEEDED   valsartan-hydrochlorothiazide (DIOVAN-HCT) 80-12.5 MG tablet, Take 1 tablet by mouth once daily for blood pressure  Current Outpatient Medications (Respiratory):    albuterol (VENTOLIN HFA) 108 (90 Base) MCG/ACT inhaler, USE 2 INHALATIONS 15-20 MINUTES APART EVERY 4 HOURS AS NEEDED TO RESCUE ASTHMA   azelastine (ASTELIN) 0.1 % nasal spray, Place 1-2 sprays into both nostrils 2 (two) times daily as needed (nasal drainage). Use in each nostril as directed   cetirizine (ZYRTEC ALLERGY) 10 MG tablet, Take 1 tablet (10 mg total) by mouth daily.   fluticasone furoate-vilanterol (BREO  ELLIPTA) 200-25 MCG/ACT AEPB, Inhale 1 puff into the lungs daily. Use at the first sign of upper respiratory infections for 1-2 weeks. Rinse mouth after each use.   montelukast (SINGULAIR) 10 MG tablet, Take  1 tablet  Daily  for Allergies  Current Outpatient Medications (Analgesics):    meloxicam (MOBIC) 15 MG tablet, TAKE 1/2 TO 1 (ONE-HALF TO ONE) TABLET BY MOUTH ONCE DAILY WITH FOOD FOR PAIN AND FOR INFLAMMATION   Current Outpatient Medications (Other):    escitalopram (LEXAPRO) 10 MG tablet, Take 1 tablet Daily for Mood   omeprazole (PRILOSEC) 20 MG capsule, TAKE 1 CAPSULE BY MOUTH ONCE DAILY TO  PREVENT  HEARTBURN  AND  INDIGESTION   phentermine (ADIPEX-P) 37.5 MG tablet, TAKE 1/2 TO 1 (ONE-HALF TO ONE) TABLET BY MOUTH ONCE DAILY IN THE MORNING FOR  DIETING  AND  WEIGHT  LOSS   tamsulosin (FLOMAX) 0.4 MG CAPS capsule, TAKE 1 CAPSULE BY MOUTH AT BEDTIME FOR PROSTATE   topiramate (TOPAMAX) 50 MG tablet, Take 1/2 to 1 tablet 2 x /day at Suppertime & Bedtime for Dieting & Weight Loss  Problem list He has Essential hypertension; Cluster headaches;  OSA treated with BiPAP; Vitamin D deficiency; Testosterone deficiency; Morbid obesity ([place]); Hyperlipidemia, mixed; Screening for colorectal cancer; Dyspnea; Trigger finger, right ring finger; Bilateral hand pain; Depression; Chronic pain of both shoulders; Insomnia; Gastroesophageal reflux disease; Fatty liver; Benign prostatic hyperplasia (BPH) with post-void dribbling; Thoracic aortic atherosclerosis ([place]) by Chest CT scan on10/21/2020.; Insulin resistance; Other allergic rhinitis; Anaphylactic reaction due to food, subsequent encounter; Recurrent infections; Hymenoptera allergy; and Mild intermittent reactive airway disease on their problem list.   Observations/Objective:  BP 131/73   Pulse 64   Temp (!) 96.9 F (36.1 C)   Ht 5' 9.5" (1.765 m)   Wt 247 lb (112 kg)   SpO2 99%   BMI 35.95 kg/m   HEENT - WNL. Neck - supple.  Chest - Clear  equal BS. Cor - Nl HS. RRR w/o sig MGR. PP 1(+). No edema. MS- FROM w/o deformities.  Gait Nl. Neuro -  Nl w/o focal abnormalities.  Assessment and Plan:   1. Essential hypertension   2. Fatigue   3. Reactive depression  - escitalopram (LEXAPRO) 10 MG tablet;  Take 1 tablet Daily for Mood   Dispense: 90 tablet; Refill: 3  4. Class 2 severe obesity due to excess calories with serious comorbidity  and body mass index (BMI) of 36.0 to 36.9 in adult (HCC)  - topiramate (TOPAMAX) 50 MG tablet;  Take 1/2 to 1 tablet 2 x /day at Suppertime & Bedtime for Dieting & Weight Loss   Dispense: 180 tablet; Refill: 1    Follow Up Instructions:      I discussed the assessment and treatment plan with the patient.  Discussed meds & side-effects.   The patient was provided an opportunity to ask questions and all were answered. The patient agreed with the plan and demonstrated an understanding of the instructions.       The patient was advised to call back or seek an in-person evaluation if the symptoms worsen or if the condition fails to improve as anticipated.   Kirtland Bouchard, MD

## 2022-05-05 ENCOUNTER — Encounter: Payer: Self-pay | Admitting: Internal Medicine

## 2022-05-05 ENCOUNTER — Ambulatory Visit (INDEPENDENT_AMBULATORY_CARE_PROVIDER_SITE_OTHER): Payer: Medicare HMO | Admitting: Internal Medicine

## 2022-05-05 VITALS — BP 131/73 | HR 64 | Temp 96.9°F | Ht 69.5 in | Wt 247.0 lb

## 2022-05-05 DIAGNOSIS — R69 Illness, unspecified: Secondary | ICD-10-CM | POA: Diagnosis not present

## 2022-05-05 DIAGNOSIS — F329 Major depressive disorder, single episode, unspecified: Secondary | ICD-10-CM | POA: Diagnosis not present

## 2022-05-05 DIAGNOSIS — R5383 Other fatigue: Secondary | ICD-10-CM | POA: Diagnosis not present

## 2022-05-05 DIAGNOSIS — Z6836 Body mass index (BMI) 36.0-36.9, adult: Secondary | ICD-10-CM | POA: Diagnosis not present

## 2022-05-05 DIAGNOSIS — I10 Essential (primary) hypertension: Secondary | ICD-10-CM | POA: Diagnosis not present

## 2022-05-05 MED ORDER — TOPIRAMATE 50 MG PO TABS: ORAL_TABLET | ORAL | 1 refills | Status: DC

## 2022-05-05 MED ORDER — ESCITALOPRAM OXALATE 10 MG PO TABS: ORAL_TABLET | ORAL | 3 refills | Status: DC

## 2022-05-07 ENCOUNTER — Other Ambulatory Visit: Payer: Self-pay | Admitting: Nurse Practitioner

## 2022-05-13 DIAGNOSIS — G4733 Obstructive sleep apnea (adult) (pediatric): Secondary | ICD-10-CM | POA: Diagnosis not present

## 2022-05-25 ENCOUNTER — Other Ambulatory Visit: Payer: Self-pay | Admitting: Nurse Practitioner

## 2022-05-25 DIAGNOSIS — I10 Essential (primary) hypertension: Secondary | ICD-10-CM

## 2022-05-25 DIAGNOSIS — N138 Other obstructive and reflux uropathy: Secondary | ICD-10-CM

## 2022-05-26 ENCOUNTER — Other Ambulatory Visit: Payer: Self-pay | Admitting: Nurse Practitioner

## 2022-05-31 NOTE — Progress Notes (Signed)
Follow Up Note  RE: George Montgomery MRN: [id number] DOB: 10-25-1955 Date of Office Visit: 06/01/2022  Referring provider: Unk Pinto, MD Primary care provider: Unk Pinto, MD  Chief Complaint: Follow-up (Been sick every since May--sinus infection then flu/covid then abscess tooth )  History of Present Illness: I had the pleasure of seeing George Montgomery for a follow up visit at the [facility] on 06/01/2022. He is a 66 y.o. male, who is being followed for allergic rhinitis, recurrent infections, reactive airway disease, hymenoptera allergy and food allergy. His previous allergy office visit was on 02/24/2022 with Dr. Maudie Montgomery. Today is a regular follow up visit.  Allergic rhinitis Currently taking Singulair, OTC antihistamines, Flonase 1 spray per nostril and azelastine prn with some benefit.   Recurrent infections Patient had a course of antibiotics in June due to bronchitis and he may had some type of URI about 3 weeks ago with fevers. Negative Covid-19 testing. He then had another cold as well.   Diabetes is doing better.   Mild intermittent reactive airway disease Patient used Breo during URIs with some benefit. Used albuterol 1-2 times per day during URIs with good benefit.  Denies any ER/urgent care visits.   Had 1 course of prednisone since the last visit.    Hymenoptera allergy No stings since the last visit. He does have Epipen on hand.    Food allergy Currently avoiding all tree nuts. No issues with peanut butter.   He had an episode of feeling lightheaded while outdoors and his wife took his blood pressure and apparently the lower number was in the 50s. Since then he stopped taking his diovan but still taking bisoprolol. He did not follow up with PCP regarding this.  Assessment and Plan: George Montgomery is a 66 y.o. male with: Reactive airway disease Past history - Gets wheezing, coughing and shortness of breath mainly during URIs. Uses albuterol  rarely but during the last URI needed to add on Trelegy for 2 weeks. 2023 CXR unremarkable. 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 Clinically feeling slightly improved.  Interim history - needed antibiotics and prednisone during recent URI and still not feeling back to baseline.  Today's spirometry showed restriction. Daily controller medication(s): start Breo 18mg 1 puff once a day and rinse mouth after each use.  May use albuterol rescue inhaler 2 puffs every 4 to 6 hours as needed for shortness of breath, chest tightness, coughing, and wheezing. Monitor frequency of use.  Get spirometry at next visit.  Hymenoptera allergy Past history - Went to ER few times after stings due to hives and swelling. Never required Epi and no prior work up. Interim history - 2023 bloodwork positive to honeybee, white faced hornet, yellow jacket, wasp, yellow hornet and bumblebee. Continue to avoid. Recommend starting venom immunotherapy - mixed vespid, wasp and honeybee. Breathing must be in better control. For mild symptoms you can take over the counter antihistamines such as Benadryl and monitor symptoms closely. If symptoms worsen or if you have severe symptoms including breathing issues, throat closure, significant swelling, whole body hives, severe diarrhea and vomiting, lightheadedness then inject epinephrine and seek immediate medical care afterwards. Action plan given.  Seasonal and perennial allergic rhinitis Past history - Perennial rhinoconjunctivitis symptoms for many years mainly in the spring and fall.  Tried Singulair, Zyrtec and Flonase with some benefit.  No prior ENT evaluation.  2023 CT  sinus showed mild mucosal thickening. 2023 skin testing showed: Positive to dust mites, mold and cockroach. Interim history -  2023 bloodwork positive to dust mites, grass, ragweed.  Borderline to cat,  cockroach, tree, weed pollen. Continue environmental control measures as below. Continue Singulair (montelukast) '10mg'$  daily at night. Use over the counter antihistamines such as Zyrtec (cetirizine), Claritin (loratadine), Allegra (fexofenadine), or Xyzal (levocetirizine) daily as needed. May take twice a day during allergy flares. May switch antihistamines every few months. Use Flonase (fluticasone) nasal spray 1 spray per nostril twice a day as needed for nasal congestion.  Use azelastine nasal spray 1-2 sprays per nostril twice a day as needed for runny nose/drainage. Nasal saline spray (i.e., Simply Saline) or nasal saline lavage (i.e., NeilMed) is recommended as needed and prior to medicated nasal sprays. Consider starting AIT for environmental allergies after done with VIT build up.  Anaphylactic reaction due to food, subsequent encounter Past history - Upper body itching after eating mixed nuts in December 2023. Has eaten peanut butter since then with no issues. 2023 skin testing showed: Positive to [place] nuts. Interim history - 2023 bloodowrk positive to [place] nuts, borderline to walnuts, pecan and pistachio.  More likely to have anaphylactic reaction to [place] nuts, walnuts. Continue strict avoidance of tree nuts. Okay to eat peanuts.  For mild symptoms you can take over the counter antihistamines such as Benadryl and monitor symptoms closely. If symptoms worsen or if you have severe symptoms including breathing issues, throat closure, significant swelling, whole body hives, severe diarrhea and vomiting, lightheadedness then inject epinephrine and seek immediate medical care afterwards.  Recurrent infections Past history - Sinus infections once per year but this time it took 3 rounds of antibiotics and 2 courses of prednisone to clear. History of pneumonias. Recently diagnosed with diabetes.  Interim history - 2023 bloodwork normal immunoglobulin, positive titers against diptheria,  tetanus and strep pneumoniae. 1 course of antibiotics and prednisone since last visit. Had 2 viral URIs as well.  Keep track of infections and antibiotics use.  Follow up with PCP regarding his hypertension. Prefer that patient not be on beta blockers while starting allergy shots.   Return in about 6 weeks (around 07/13/2022).  Meds ordered this encounter  Medications   fluticasone furoate-vilanterol (BREO ELLIPTA) 100-25 MCG/ACT AEPB    Sig: Inhale 1 puff into the lungs daily. Rinse mouth after each use.    Dispense:  60 each    Refill:  3   Lab Orders  No laboratory test(s) ordered today    Diagnostics: Spirometry:  Tracings reviewed. His effort: Good reproducible efforts. FVC: 2.55L FEV1: 1.92L, 59% predicted FEV1/FVC ratio: 75% Interpretation: Spirometry consistent with possible restrictive disease.  Please see scanned spirometry results for details.  Medication List:  Current Outpatient Medications  Medication Sig Dispense Refill   albuterol (VENTOLIN HFA) 108 (90 Base) MCG/ACT inhaler USE 2 INHALATIONS 15-20 MINUTES APART EVERY 4 HOURS AS NEEDED TO RESCUE ASTHMA 48 g 3   azelastine (ASTELIN) 0.1 % nasal spray Place 1-2 sprays into both nostrils 2 (two) times daily as needed (nasal drainage). Use in each nostril as directed 30 mL 5   bisoprolol (ZEBETA) 10 MG tablet Take 1 tablet by mouth once daily for blood pressure 90 tablet 3   cetirizine (ZYRTEC ALLERGY) 10 MG tablet Take 1 tablet (10 mg total) by mouth daily. 30 tablet 0   EPINEPHrine (EPIPEN 2-PAK) 0.3 mg/0.3 mL IJ SOAJ injection Inject 0.3 mg into the muscle as needed  for anaphylaxis. 2 each 2   escitalopram (LEXAPRO) 10 MG tablet Take 1 tablet Daily for Mood 90 tablet 3   fenofibrate micronized (LOFIBRA) 134 MG capsule TAKE 1 CAPSULE BY MOUTH ONCE DAILY FOR  TRIGLYCERIDES  (BLOOD  FATS) 90 capsule 0   fluticasone furoate-vilanterol (BREO ELLIPTA) 100-25 MCG/ACT AEPB Inhale 1 puff into the lungs daily. Rinse mouth  after each use. 60 each 3   meloxicam (MOBIC) 15 MG tablet TAKE 1/2 TO 1 (ONE-HALF TO ONE) TABLET BY MOUTH ONCE DAILY WITH FOOD FOR PAIN AND FOR INFLAMMATION 90 tablet 0   montelukast (SINGULAIR) 10 MG tablet Take  1 tablet  Daily  for Allergies 90 tablet 3   omeprazole (PRILOSEC) 20 MG capsule TAKE 1 CAPSULE BY MOUTH ONCE DAILY TO  PREVENT  HEARTBURN  AND  INDIGESTION . 90 capsule 0   phentermine (ADIPEX-P) 37.5 MG tablet TAKE 1/2 TO 1 (ONE-HALF TO ONE) TABLET BY MOUTH ONCE DAILY IN  THE  MORNING  FOR  DIETING  AND  WEIGHT  LOSS 30 tablet 0   sildenafil (VIAGRA) 100 MG tablet TAKE ONE TABLET BY MOUTH ONE TIME DAILY AS NEEDED 30 tablet 0   tamsulosin (FLOMAX) 0.4 MG CAPS capsule TAKE 1 CAPSULE BY MOUTH AT BEDTIME FOR PROSTATE 90 capsule 0   topiramate (TOPAMAX) 50 MG tablet Take 1/2 to 1 tablet 2 x /day at Suppertime & Bedtime for Dieting & Weight Loss 828 tablet 1   TRULICITY 3 MK/3.4JZ SOPN INJECT 3 MG AS DIRECTED ONCE A WEEK 4 mL 0   valsartan-hydrochlorothiazide (DIOVAN-HCT) 80-12.5 MG tablet Take 1 tablet by mouth once daily for blood pressure 90 tablet 0   No current facility-administered medications for this visit.   Allergies: Allergies  Allergen Reactions   Bee Venom Anaphylaxis   Pine Anaphylaxis   [place] Nut (Berthollefia [place]) Skin Test     pruritus   Lisinopril Other (See Comments)    Patient intolerant of drug which caused his tongue to tingle.   Celebrex [Celecoxib]    I reviewed his past medical history, social history, family history, and environmental history and no significant changes have been reported from his previous visit.  Review of Systems  Constitutional:  Negative for appetite change, chills, fever and unexpected weight change.  HENT:  Positive for postnasal drip and rhinorrhea. Negative for congestion.   Eyes:  Negative for itching.  Respiratory:  Negative for cough, chest tightness, shortness of breath and wheezing.   Cardiovascular:  Negative for chest  pain.  Gastrointestinal:  Negative for abdominal pain.  Genitourinary:  Negative for difficulty urinating.  Skin:  Negative for rash.  Allergic/Immunologic: Positive for environmental allergies and food allergies.    Objective: BP 134/76   Pulse 61   Temp 98.1 F (36.7 C)   Resp 18   Ht 5' 9.5" (1.765 m)   Wt 247 lb 6 oz (112.2 kg)   SpO2 96%   BMI 36.01 kg/m  Body mass index is 36.01 kg/m. Physical Exam Vitals and nursing note reviewed.  Constitutional:      Appearance: Normal appearance. He is well-developed. He is obese.  HENT:     Head: Normocephalic and atraumatic.     Right Ear: Tympanic membrane and external ear normal.     Left Ear: Tympanic membrane and external ear normal.     Nose: Nose normal.     Mouth/Throat:     Mouth: Mucous membranes are moist.     Pharynx: Oropharynx is  clear.  Eyes:     Conjunctiva/sclera: Conjunctivae normal.  Cardiovascular:     Rate and Rhythm: Normal rate and regular rhythm.     Heart sounds: Normal heart sounds. No murmur heard.    No friction rub. No gallop.  Pulmonary:     Effort: Pulmonary effort is normal.     Breath sounds: Normal breath sounds. No wheezing, rhonchi or rales.  Musculoskeletal:     Cervical back: Neck supple.  Skin:    General: Skin is warm.     Findings: No rash.  Neurological:     Mental Status: He is alert and oriented to person, place, and time.  Psychiatric:        Behavior: Behavior normal.   Previous notes and tests were reviewed. The plan was reviewed with the patient/family, and all questions/concerned were addressed.  It was my pleasure to see George Montgomery today and participate in his care. Please feel free to contact me with any questions or concerns.  Sincerely,  Rexene Alberts, DO Allergy & Immunology  [facility] office: [place] office: [phone]

## 2022-06-01 ENCOUNTER — Ambulatory Visit: Payer: Medicare HMO | Admitting: Allergy

## 2022-06-01 ENCOUNTER — Encounter: Payer: Self-pay | Admitting: Allergy

## 2022-06-01 VITALS — BP 134/76 | HR 61 | Temp 98.1°F | Resp 18 | Ht 69.5 in | Wt 247.4 lb

## 2022-06-01 DIAGNOSIS — J302 Other seasonal allergic rhinitis: Secondary | ICD-10-CM

## 2022-06-01 DIAGNOSIS — T7800XD Anaphylactic reaction due to unspecified food, subsequent encounter: Secondary | ICD-10-CM

## 2022-06-01 DIAGNOSIS — J45909 Unspecified asthma, uncomplicated: Secondary | ICD-10-CM | POA: Insufficient documentation

## 2022-06-01 DIAGNOSIS — J4540 Moderate persistent asthma, uncomplicated: Secondary | ICD-10-CM

## 2022-06-01 DIAGNOSIS — B999 Unspecified infectious disease: Secondary | ICD-10-CM | POA: Diagnosis not present

## 2022-06-01 DIAGNOSIS — J3089 Other allergic rhinitis: Secondary | ICD-10-CM

## 2022-06-01 DIAGNOSIS — Z91038 Other insect allergy status: Secondary | ICD-10-CM

## 2022-06-01 MED ORDER — FLUTICASONE FUROATE-VILANTEROL 100-25 MCG/ACT IN AEPB
1.0000 | INHALATION_SPRAY | Freq: Every day | RESPIRATORY_TRACT | 3 refills | Status: DC
Start: 2022-06-01 — End: 2022-07-27

## 2022-06-01 NOTE — Assessment & Plan Note (Signed)
Past history - Upper body itching after eating mixed nuts in December 2023. Has eaten peanut butter since then with no issues. 2023 skin testing showed: Positive to [place] nuts. Interim history - 2023 bloodowrk positive to [place] nuts, borderline to walnuts, pecan and pistachio.  More likely to have anaphylactic reaction to [place] nuts, walnuts. . Continue strict avoidance of tree nuts. Faythe Ghee to eat peanuts.  . For mild symptoms you can take over the counter antihistamines such as Benadryl and monitor symptoms closely. If symptoms worsen or if you have severe symptoms including breathing issues, throat closure, significant swelling, whole body hives, severe diarrhea and vomiting, lightheadedness then inject epinephrine and seek immediate medical care afterwards.

## 2022-06-01 NOTE — Patient Instructions (Signed)
Breathing Daily controller medication(s): start Breo 132mg 1 puff once a day and rinse mouth after each use.  May use albuterol rescue inhaler 2 puffs every 4 to 6 hours as needed for shortness of breath, chest tightness, coughing, and wheezing. Monitor frequency of use.  Breathing control goals:  Full participation in all desired activities (may need albuterol before activity) Albuterol use two times or less a week on average (not counting use with activity) Cough interfering with sleep two times or less a month Oral steroids no more than once a year No hospitalizations  Food 2023 testing: positive to bBolivianuts, borderline to walnuts, pecan and pistachio.  More likely to have anaphylactic reaction to BBolivianuts, walnuts. Continue strict avoidance of tree nuts. Okay with eating peanuts.   Bee stings 2023 bloodwork positive to honeybee, white faced hornet, yellow jacket, wasp, yellow hornet and bumblebee. Continue to avoid. Recommend starting injections for this first - 3 shots Breathing must be in better control. For mild symptoms you can take over the counter antihistamines such as Benadryl and monitor symptoms closely. If symptoms worsen or if you have severe symptoms including breathing issues, throat closure, significant swelling, whole body hives, severe diarrhea and vomiting, lightheadedness then inject epinephrine and seek immediate medical care afterwards. Action plan given.  Environmental allergies 2023 skin testing positive to dust mites, mold and cockroach. 2023 bloodwork positive to dust mites, grass, ragweed.  Borderline to cat, cockroach, tree, weed pollen. Continue environmental control measures as below. Continue Singulair (montelukast) '10mg'$  daily at night. Use over the counter antihistamines such as Zyrtec (cetirizine), Claritin (loratadine), Allegra (fexofenadine), or Xyzal (levocetirizine) daily as needed. May take twice a day during allergy flares. May switch  antihistamines every few months. Use Flonase (fluticasone) nasal spray 1 spray per nostril twice a day as needed for nasal congestion.  Use azelastine nasal spray 1-2 sprays per nostril twice a day as needed for runny nose/drainage. Nasal saline spray (i.e., Simply Saline) or nasal saline lavage (i.e., NeilMed) is recommended as needed and prior to medicated nasal sprays. Consider starting injections for this as well after the build up is finished with the bee stings.   Infections Keep track of infections and antibiotics use.  Blood pressure Follow up with PCP regarding this.  Prefer that you don't be on any beta blockers while starting allergy injections.   Follow up in 6 weeks or sooner if needed.  [place] [address] [address] [place] [address] [address]). [place]: [phone](same phone number).   Reducing Pollen Exposure Pollen seasons: trees (spring), grass (summer) and ragweed/weeds (fall). Keep windows closed in your home and car to lower pollen exposure.  Install air conditioning in the bedroom and throughout the house if possible.  Avoid going out in dry windy days - especially early morning. Pollen counts are highest between 5 - 10 AM and on dry, hot and windy days.  Save outside activities for late afternoon or after a heavy rain, when pollen levels are lower.  Avoid mowing of grass if you have grass pollen allergy. Be aware that pollen can also be transported indoors on people and pets.  Dry your clothes in an automatic dryer rather than hanging them outside where they might collect pollen.  Rinse hair and eyes before bedtime. Mold Control Mold and fungi can grow on a variety of surfaces provided certain temperature and moisture conditions exist.  Outdoor molds  grow on plants, decaying vegetation and soil. The major outdoor mold, Alternaria and Cladosporium, are found in very high  numbers during hot and dry conditions. Generally, a late summer - fall peak is seen for common outdoor fungal spores. Rain will temporarily lower outdoor mold spore count, but counts rise rapidly when the rainy period ends. The most important indoor molds are Aspergillus and Penicillium. Dark, humid and poorly ventilated basements are ideal sites for mold growth. The next most common sites of mold growth are the bathroom and the kitchen. Outdoor (Seasonal) Mold Control Use air conditioning and keep windows closed. Avoid exposure to decaying vegetation. Avoid leaf raking. Avoid grain handling. Consider wearing a face mask if working in moldy areas.  Indoor (Perennial) Mold Control  Maintain humidity below 50%. Get rid of mold growth on hard surfaces with water, detergent and, if necessary, 5% bleach (do not mix with other cleaners). Then dry the area completely. If mold covers an area more than 10 square feet, consider hiring an indoor environmental professional. For clothing, washing with soap and water is best. If moldy items cannot be cleaned and dried, throw them away. Remove sources e.g. contaminated carpets. Repair and seal leaking roofs or pipes. Using dehumidifiers in damp basements may be helpful, but empty the water and clean units regularly to prevent mildew from forming. All rooms, especially basements, bathrooms and kitchens, require ventilation and cleaning to deter mold and mildew growth. Avoid carpeting on concrete or damp floors, and storing items in damp areas. Control of House Dust Mite Allergen Dust mite allergens are a common trigger of allergy and asthma symptoms. While they can be found throughout the house, these microscopic creatures thrive in warm, humid environments such as bedding, upholstered furniture and carpeting. Because so much time is spent in the bedroom, it is essential to reduce mite levels there.  Encase pillows, mattresses, and box springs in special  allergen-proof fabric covers or airtight, zippered plastic covers.  Bedding should be washed weekly in hot water (130 F) and dried in a hot dryer. Allergen-proof covers are available for comforters and pillows that can't be regularly washed.  Wash the allergy-proof covers every few months. Minimize clutter in the bedroom. Keep pets out of the bedroom.  Keep humidity less than 50% by using a dehumidifier or air conditioning. You can buy a humidity measuring device called a hygrometer to monitor this.  If possible, replace carpets with hardwood, linoleum, or washable area rugs. If that's not possible, vacuum frequently with a vacuum that has a HEPA filter. Remove all upholstered furniture and non-washable window drapes from the bedroom. Remove all non-washable stuffed toys from the bedroom.  Wash stuffed toys weekly. Pet Allergen Avoidance: Contrary to popular opinion, there are no "hypoallergenic" breeds of dogs or cats. That is because people are not allergic to an animal's hair, but to an allergen found in the animal's saliva, dander (dead skin flakes) or urine. Pet allergy symptoms typically occur within minutes. For some people, symptoms can build up and become most severe 8 to 12 hours after contact with the animal. People with severe allergies can experience reactions in public places if dander has been transported on the pet owners' clothing. Keeping an animal outdoors is only a partial solution, since homes with pets in the yard still have higher concentrations of animal allergens. Before getting a pet, ask your allergist to determine if you are allergic to animals. If your pet is already considered part of your family, try to  minimize contact and keep the pet out of the bedroom and other rooms where you spend a great deal of time. As with dust mites, vacuum carpets often or replace carpet with a hardwood floor, tile or linoleum. High-efficiency particulate air (HEPA) cleaners can reduce allergen  levels over time. While dander and saliva are the source of cat and dog allergens, urine is the source of allergens from rabbits, hamsters, mice and [place] pigs; so ask a non-allergic family member to clean the animal's cage. If you have a pet allergy, talk to your allergist about the potential for allergy immunotherapy (allergy shots). This strategy can often provide long-term relief. Cockroach Allergen Avoidance Cockroaches are often found in the homes of densely populated urban areas, schools or commercial buildings, but these creatures can lurk almost anywhere. This does not mean that you have a dirty house or living area. Block all areas where roaches can enter the home. This includes crevices, wall cracks and windows.  Cockroaches need water to survive, so fix and seal all leaky faucets and pipes. Have an exterminator go through the house when your family and pets are gone to eliminate any remaining roaches. Keep food in lidded containers and put pet food dishes away after your pets are done eating. Vacuum and sweep the floor after meals, and take out garbage and recyclables. Use lidded garbage containers in the kitchen. Wash dishes immediately after use and clean under stoves, refrigerators or toasters where crumbs can accumulate. Wipe off the stove and other kitchen surfaces and cupboards regularly.

## 2022-06-01 NOTE — Assessment & Plan Note (Addendum)
Past history - Gets wheezing, coughing and shortness of breath mainly during URIs. Uses albuterol rarely but during the last URI needed to add on Trelegy for 2 weeks. 2023 CXR unremarkable. 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 Clinically feeling slightly improved.  Interim history - needed antibiotics and prednisone during recent URI and still not feeling back to baseline.   Today's spirometry showed restriction. . Daily controller medication(s): start Breo 144mg 1 puff once a day and rinse mouth after each use.  . May use albuterol rescue inhaler 2 puffs every 4 to 6 hours as needed for shortness of breath, chest tightness, coughing, and wheezing. Monitor frequency of use.  . Get spirometry at next visit.

## 2022-06-01 NOTE — Assessment & Plan Note (Signed)
Past history - Went to ER few times after stings due to hives and swelling. Never required Epi and no prior work up. Interim history - 2023 bloodwork positive to honeybee, white faced hornet, yellow jacket, wasp, yellow hornet and bumblebee. . Continue to avoid. Marland Kitchen Recommend starting venom immunotherapy - mixed vespid, wasp and honeybee. o Breathing must be in better control. . For mild symptoms you can take over the counter antihistamines such as Benadryl and monitor symptoms closely. If symptoms worsen or if you have severe symptoms including breathing issues, throat closure, significant swelling, whole body hives, severe diarrhea and vomiting, lightheadedness then inject epinephrine and seek immediate medical care afterwards. . Action plan given.

## 2022-06-01 NOTE — Assessment & Plan Note (Signed)
Past history - Perennial rhinoconjunctivitis symptoms for many years mainly in the spring and fall.  Tried Singulair, Zyrtec and Flonase with some benefit.  No prior ENT evaluation.  2023 CT sinus showed mild mucosal thickening. 2023 skin testing showed: Positive to dust mites, mold and cockroach. Interim history -  2023 bloodwork positive to dust mites, grass, ragweed.  Borderline to cat, cockroach, tree, weed pollen.  Continue environmental control measures as below.  Continue Singulair (montelukast) '10mg'$  daily at night.  Use over the counter antihistamines such as Zyrtec (cetirizine), Claritin (loratadine), Allegra (fexofenadine), or Xyzal (levocetirizine) daily as needed. May take twice a day during allergy flares. May switch antihistamines every few months.  Use Flonase (fluticasone) nasal spray 1 spray per nostril twice a day as needed for nasal congestion.   Use azelastine nasal spray 1-2 sprays per nostril twice a day as needed for runny nose/drainage.  Nasal saline spray (i.e., Simply Saline) or nasal saline lavage (i.e., NeilMed) is recommended as needed and prior to medicated nasal sprays.  Consider starting AIT for environmental allergies after done with VIT build up.

## 2022-06-01 NOTE — Assessment & Plan Note (Signed)
Past history - Sinus infections once per year but this time it took 3 rounds of antibiotics and 2 courses of prednisone to clear. History of pneumonias. Recently diagnosed with diabetes.  Interim history - 2023 bloodwork normal immunoglobulin, positive titers against diptheria, tetanus and strep pneumoniae. 1 course of antibiotics and prednisone since last visit. Had 2 viral URIs as well.  Marland Kitchen Keep track of infections and antibiotics use.

## 2022-06-11 ENCOUNTER — Other Ambulatory Visit: Payer: Self-pay | Admitting: Internal Medicine

## 2022-06-13 DIAGNOSIS — G4733 Obstructive sleep apnea (adult) (pediatric): Secondary | ICD-10-CM | POA: Diagnosis not present

## 2022-07-06 ENCOUNTER — Other Ambulatory Visit: Payer: Self-pay | Admitting: Nurse Practitioner

## 2022-07-06 ENCOUNTER — Ambulatory Visit
Admission: RE | Admit: 2022-07-06 | Discharge: 2022-07-06 | Disposition: A | Discharge status: Home / Self Care | Payer: Medicare HMO | Source: Ambulatory Visit | Attending: Nurse Practitioner | Admitting: Nurse Practitioner

## 2022-07-06 ENCOUNTER — Ambulatory Visit (INDEPENDENT_AMBULATORY_CARE_PROVIDER_SITE_OTHER): Payer: Medicare HMO | Admitting: Nurse Practitioner

## 2022-07-06 ENCOUNTER — Encounter: Payer: Self-pay | Admitting: Nurse Practitioner

## 2022-07-06 VITALS — BP 142/74 | HR 63 | Temp 97.3°F | Ht 69.5 in | Wt 250.0 lb

## 2022-07-06 DIAGNOSIS — W19XXXA Unspecified fall, initial encounter: Secondary | ICD-10-CM | POA: Diagnosis not present

## 2022-07-06 DIAGNOSIS — R079 Chest pain, unspecified: Secondary | ICD-10-CM

## 2022-07-06 DIAGNOSIS — R071 Chest pain on breathing: Secondary | ICD-10-CM

## 2022-07-06 DIAGNOSIS — R0781 Pleurodynia: Secondary | ICD-10-CM | POA: Diagnosis not present

## 2022-07-06 DIAGNOSIS — M19012 Primary osteoarthritis, left shoulder: Secondary | ICD-10-CM | POA: Diagnosis not present

## 2022-07-06 MED ORDER — HYDROCODONE-ACETAMINOPHEN 5-325 MG PO TABS: ORAL_TABLET | ORAL | 0 refills | Status: DC

## 2022-07-06 NOTE — Progress Notes (Unsigned)
Assessment and Plan:  George Montgomery was seen today for an episodic visit.  Diagnoses and all order for this visit:  1. Fall, initial encounter  - DG Ribs Unilateral Right; Future  2. Left-sided chest pain Discussed possible broken ribs. May place an ace bandage around torso to provide support. Small dose short term supply of Norco for tmt of pain.  - DG Ribs Unilateral Right; Future - HYDROcodone-acetaminophen (NORCO) 5-325 MG tablet; Take 1/2 to 1 tablet every 3 to 4 hours as needed for Severe Pain  Dispense: 10 tablet; Refill: 0  3. Chest pain made worse by breathing  - DG Ribs Unilateral Right; Future  Monitor for increase symptoms that include difficulty  If you notice increase in difficulty breathing, coughing up pink frothy sputum, chest pain. If noticed report to ER.    Notify office for further evaluation and treatment, questions or concerns if s/s fail to improve. The risks and benefits of my recommendations, as well as other treatment options were discussed with the patient today. Questions were answered.  Further disposition pending results of labs. Discussed med's effects and SE's.    Over 20 minutes of exam, counseling, chart review, and critical decision making was performed.   Future Appointments  Date Time Provider [place]  07/15/2022 11:00 AM Alycia Rossetti, NP [facility] None  07/27/2022  8:30 AM Garnet Sierras, DO [facility] None  09/29/2022  8:45 AM Debbora Presto, NP [facility] None  10/14/2022  9:30 AM Unk Pinto, MD [facility] None  02/23/2023  3:00 PM Unk Pinto, MD [facility] None    ------------------------------------------------------------------------------------------------------------------   HPI BP (!) 142/74   Pulse 63   Temp (!) 97.3 F (36.3 C)   Ht 5' 9.5" (1.765 m)   Wt 250 lb (113.4 kg)   SpO2 96%   BMI 36.39 kg/m   66 y.o.male presents for evaluation and management of left side rib pain that he feels is attributed  to a fall 3 days ago.  He was working on his boat, blowing up the tires with an air compressor, when he got tangled and tripped over the cord.  He fell on his right side and onto the air compressor.  He complains of left sided lower rib pain, pain with deep inspiration, coughing, laughing, bending.  He denies any SOB, pink frothy sputum.  Past Medical History:  Diagnosis Date   Arthritis    Asthma    Back pain    Chronic pain of both shoulders    Depression    Dyspnea    Elevated glucose    Environmental allergies    GERD (gastroesophageal reflux disease)    HLD (hyperlipidemia)    HTN (hypertension)    Insomnia    Obesity    OSA on CPAP 08/01/2014   Reflux    Trigger finger      Allergies  Allergen Reactions   Bee Venom Anaphylaxis   Pine Anaphylaxis   [place] Nut Daisey Must) Skin Test     pruritus   Lisinopril Other (See Comments)    Patient intolerant of drug which caused his tongue to tingle.   Celebrex [Celecoxib]     Current Outpatient Medications on File Prior to Visit  Medication Sig   albuterol (VENTOLIN HFA) 108 (90 Base) MCG/ACT inhaler USE 2 INHALATIONS 15-20 MINUTES APART EVERY 4 HOURS AS NEEDED TO RESCUE ASTHMA   azelastine (ASTELIN) 0.1 % nasal spray Place 1-2 sprays into both nostrils 2 (two) times daily as needed (  nasal drainage). Use in each nostril as directed   bisoprolol (ZEBETA) 10 MG tablet Take 1 tablet by mouth once daily for blood pressure   cetirizine (ZYRTEC ALLERGY) 10 MG tablet Take 1 tablet (10 mg total) by mouth daily.   EPINEPHrine (EPIPEN 2-PAK) 0.3 mg/0.3 mL IJ SOAJ injection Inject 0.3 mg into the muscle as needed for anaphylaxis.   escitalopram (LEXAPRO) 10 MG tablet Take 1 tablet Daily for Mood   fenofibrate micronized (LOFIBRA) 134 MG capsule TAKE 1 CAPSULE BY MOUTH ONCE DAILY FOR  TRIGLYCERIDES  (BLOOD  FATS)   fluticasone furoate-vilanterol (BREO ELLIPTA) 100-25 MCG/ACT AEPB Inhale 1 puff into the lungs daily. Rinse mouth  after each use.   meloxicam (MOBIC) 15 MG tablet TAKE 1/2 TO 1 (ONE-HALF TO ONE) TABLET BY MOUTH ONCE DAILY WITH FOOD FOR PAIN AND FOR INFLAMMATION   montelukast (SINGULAIR) 10 MG tablet Take  1 tablet  Daily  for Allergies   omeprazole (PRILOSEC) 20 MG capsule TAKE 1 CAPSULE BY MOUTH ONCE DAILY TO  PREVENT  HEARTBURN  AND  INDIGESTION .   phentermine (ADIPEX-P) 37.5 MG tablet TAKE 1/2 TO 1 (ONE-HALF TO ONE) TABLET BY MOUTH ONCE DAILY IN  THE  MORNING  FOR  DIETING  AND  WEIGHT  LOSS   sildenafil (VIAGRA) 100 MG tablet TAKE ONE TABLET BY MOUTH ONE TIME DAILY AS NEEDED   tamsulosin (FLOMAX) 0.4 MG CAPS capsule TAKE 1 CAPSULE BY MOUTH AT BEDTIME FOR PROSTATE   topiramate (TOPAMAX) 50 MG tablet Take 1/2 to 1 tablet 2 x /day at Suppertime & Bedtime for Dieting & Weight Loss   TRULICITY 3 FO/2.7XA SOPN INJECT 3 MG INTO THE SKIN AS DIRECTED ONCE A WEEK   valsartan-hydrochlorothiazide (DIOVAN-HCT) 80-12.5 MG tablet Take 1 tablet by mouth once daily for blood pressure   No current facility-administered medications on file prior to visit.    ROS: all negative except what is noted in the HPI.   Physical Exam:  BP (!) 142/74   Pulse 63   Temp (!) 97.3 F (36.3 C)   Ht 5' 9.5" (1.765 m)   Wt 250 lb (113.4 kg)   SpO2 96%   BMI 36.39 kg/m   General Appearance: NAD.  Awake, conversant and cooperative. Eyes: PERRLA, EOMs intact.  Sclera white.  Conjunctiva without erythema. Sinuses: No frontal/maxillary tenderness.  No nasal discharge. Nares patent.  ENT/Mouth: Ext aud canals clear.  Bilateral TMs w/DOL and without erythema or bulging. Hearing intact.  Posterior pharynx without swelling or exudate.  Tonsils without swelling or erythema.  Neck: Supple.  No masses, nodules or thyromegaly. Respiratory: Effort is regular with non-labored breathing. Breath sounds are equal bilaterally without rales, rhonchi, wheezing or stridor. Tender to palpation along right lower lateral ribs.  Cardio: RRR with no  MRGs. Brisk peripheral pulses without edema.  Abdomen: Active BS in all four quadrants.  Soft and non-tender without guarding, rebound tenderness, hernias or masses. Lymphatics: Non tender without lymphadenopathy.  Musculoskeletal: Full ROM, 5/5 strength, normal ambulation.  No clubbing or cyanosis. Skin: Appropriate color for ethnicity. Warm without rashes, lesions, ecchymosis, ulcers.  Neuro: CN II-XII grossly normal. Normal muscle tone without cerebellar symptoms and intact sensation.   Psych: AO X 3,  appropriate mood and affect, insight and judgment.     Darrol Jump, NP 2:51 PM [facility]

## 2022-07-06 NOTE — Patient Instructions (Signed)
Rib Fracture  A rib fracture is a break or crack in one of the bones of the ribs. The ribs are like a cage that goes around your upper chest. A broken or cracked rib is often painful, but most do not cause other problems. Most rib fractures usually heal on their own in 1-3 months. What are the causes? Doing movements over and over again with a lot of force, such as pitching a baseball or having a very bad cough. A direct hit to the chest. Cancer that has spread to the bones. What are the signs or symptoms? Pain when you breathe in or cough. Pain when someone presses on the injured area. Feeling short of breath. How is this treated? Treatment depends on how bad the fracture is. In general: Most rib fractures usually heal on their own in 1-3 months. Healing may take longer if you have a cough or are doing activities that make the injury worse. While you heal, you may be given medicines to control pain. You will also be taught deep breathing exercises. Very bad injuries may require a stay at the hospital or surgery. Follow these instructions at home: Managing pain, stiffness, and swelling If told, put ice on the injured area. To do this: Put ice in a plastic bag. Place a towel between your skin and the bag. Leave the ice on for 20 minutes, 2-3 times a day. Take off the ice if your skin turns bright red. This is very important. If you cannot feel pain, heat, or cold, you have a greater risk of damage to the area. Take over-the-counter and prescription medicines only as told by your doctor. Activity Avoid activities that cause pain to the injured area. Protect your injured area. Slowly increase activity as told by your doctor. General instructions Do deep breathing exercises as told by your doctor. You may be told to: Take deep breaths many times a day. Cough several times a day while hugging a pillow. Use a device (incentive spirometer) to do deep breathing many times a day. Drink  enough fluid to keep your pee (urine) clear or pale yellow. Do not wear a rib belt or binder. Keep all follow-up visits. Contact a doctor if: You have a fever. Get help right away if: You have trouble breathing. You are short of breath. You cannot stop coughing. You cough up thick or bloody spit. You feel like you may vomit (nauseous), vomit, or have belly (abdominal) pain. Your pain gets worse and medicine does not help. These symptoms may be an emergency. Get help right away. Call your local emergency services (911 in the [place] Do not wait to see if the symptoms will go away. Do not drive yourself to the hospital. Summary A rib fracture is a break or crack in one of the bones of the ribs. Apply ice to the injured area and take medicines for pain as told by your doctor. Take deep breaths and cough several times a day. Hug a pillow every time you cough. This information is not intended to replace advice given to you by your health care provider. Make sure you discuss any questions you have with your health care provider. Document Revised: 01/26/2020 Document Reviewed: 01/26/2020 Elsevier Patient Education  [place].

## 2022-07-08 ENCOUNTER — Ambulatory Visit: Payer: Medicare HMO | Admitting: Nurse Practitioner

## 2022-07-14 DIAGNOSIS — G4733 Obstructive sleep apnea (adult) (pediatric): Secondary | ICD-10-CM | POA: Diagnosis not present

## 2022-07-15 ENCOUNTER — Ambulatory Visit: Payer: Medicare HMO | Admitting: Nurse Practitioner

## 2022-07-15 DIAGNOSIS — M21611 Bunion of right foot: Secondary | ICD-10-CM | POA: Diagnosis not present

## 2022-07-15 DIAGNOSIS — M79671 Pain in right foot: Secondary | ICD-10-CM | POA: Diagnosis not present

## 2022-07-15 DIAGNOSIS — M2021 Hallux rigidus, right foot: Secondary | ICD-10-CM | POA: Diagnosis not present

## 2022-07-18 ENCOUNTER — Other Ambulatory Visit: Payer: Self-pay | Admitting: Internal Medicine

## 2022-07-26 NOTE — Progress Notes (Unsigned)
Follow Up Note  RE: George Montgomery: [id number] DOB: Feb 01, 1956 Date of Office Visit: 07/27/2022  Referring provider: Unk Pinto, MD Primary care provider: Unk Pinto, MD  Chief Complaint: No chief complaint on file.  History of Present Illness: I had the pleasure of seeing George Montgomery for a follow up visit at the [facility] on 07/26/2022. He is a 66 y.o. male, who is being followed for reactive airway disease, hymenoptera allergy, allergic rhinitis, food allergy, recurrent infections. His previous allergy office visit was on 06/01/2022 with Dr. Maudie Mercury. Today is a regular follow up visit.  Reactive airway disease Past history - Gets wheezing, coughing and shortness of breath mainly during URIs. Uses albuterol rarely but during the last URI needed to add on Trelegy for 2 weeks. 2023 CXR unremarkable. 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 Clinically feeling slightly improved.  Interim history - needed antibiotics and prednisone during recent URI and still not feeling back to baseline.  Today's spirometry showed restriction. Daily controller medication(s): start Breo 166mg 1 puff once a day and rinse mouth after each use.  May use albuterol rescue inhaler 2 puffs every 4 to 6 hours as needed for shortness of breath, chest tightness, coughing, and wheezing. Monitor frequency of use.  Get spirometry at next visit.   Hymenoptera allergy Past history - Went to ER few times after stings due to hives and swelling. Never required Epi and no prior work up. Interim history - 2023 bloodwork positive to honeybee, white faced hornet, yellow jacket, wasp, yellow hornet and bumblebee. Continue to avoid. Recommend starting venom immunotherapy - mixed vespid, wasp and honeybee. Breathing must be in better control. For mild symptoms you can take over the counter  antihistamines such as Benadryl and monitor symptoms closely. If symptoms worsen or if you have severe symptoms including breathing issues, throat closure, significant swelling, whole body hives, severe diarrhea and vomiting, lightheadedness then inject epinephrine and seek immediate medical care afterwards. Action plan given.   Seasonal and perennial allergic rhinitis Past history - Perennial rhinoconjunctivitis symptoms for many years mainly in the spring and fall.  Tried Singulair, Zyrtec and Flonase with some benefit.  No prior ENT evaluation.  2023 CT sinus showed mild mucosal thickening. 2023 skin testing showed: Positive to dust mites, mold and cockroach. Interim history -  2023 bloodwork positive to dust mites, grass, ragweed.  Borderline to cat, cockroach, tree, weed pollen. Continue environmental control measures as below. Continue Singulair (montelukast) '10mg'$  daily at night. Use over the counter antihistamines such as Zyrtec (cetirizine), Claritin (loratadine), Allegra (fexofenadine), or Xyzal (levocetirizine) daily as needed. May take twice a day during allergy flares. May switch antihistamines every few months. Use Flonase (fluticasone) nasal spray 1 spray per nostril twice a day as needed for nasal congestion.  Use azelastine nasal spray 1-2 sprays per nostril twice a day as needed for runny nose/drainage. Nasal saline spray (i.e., Simply Saline) or nasal saline lavage (i.e., NeilMed) is recommended as needed and prior to medicated nasal sprays. Consider starting AIT for environmental allergies after done with VIT build up.   Anaphylactic reaction due to food, subsequent encounter Past history - Upper body itching after eating mixed nuts in December 2023. Has eaten peanut butter since then with no issues. 2023 skin testing showed: Positive to bBolivianuts. Interim history - 2023 bloodowrk positive to bBolivianuts, borderline to walnuts, pecan and  pistachio.  More likely to have  anaphylactic reaction to [place] nuts, walnuts. Continue strict avoidance of tree nuts. Okay to eat peanuts.  For mild symptoms you can take over the counter antihistamines such as Benadryl and monitor symptoms closely. If symptoms worsen or if you have severe symptoms including breathing issues, throat closure, significant swelling, whole body hives, severe diarrhea and vomiting, lightheadedness then inject epinephrine and seek immediate medical care afterwards.   Recurrent infections Past history - Sinus infections once per year but this time it took 3 rounds of antibiotics and 2 courses of prednisone to clear. History of pneumonias. Recently diagnosed with diabetes.  Interim history - 2023 bloodwork normal immunoglobulin, positive titers against diptheria, tetanus and strep pneumoniae. 1 course of antibiotics and prednisone since last visit. Had 2 viral URIs as well.  Keep track of infections and antibiotics use.   Follow up with PCP regarding his hypertension. Prefer that patient not be on beta blockers while starting allergy shots.   Assessment and Plan: Davieon is a 66 y.o. male with: No problem-specific Assessment & Plan notes found for this encounter.  No follow-ups on file.  No orders of the defined types were placed in this encounter.  Lab Orders  No laboratory test(s) ordered today    Diagnostics: Spirometry:  Tracings reviewed. His effort: {Blank single:19197::"Good reproducible efforts.","It was hard to get consistent efforts and there is a question as to whether this reflects a maximal maneuver.","Poor effort, data can not be interpreted."} FVC: ***L FEV1: ***L, ***% predicted FEV1/FVC ratio: ***% Interpretation: {Blank single:19197::"Spirometry consistent with mild obstructive disease","Spirometry consistent with moderate obstructive disease","Spirometry consistent with severe obstructive disease","Spirometry consistent with possible restrictive disease","Spirometry  consistent with mixed obstructive and restrictive disease","Spirometry uninterpretable due to technique","Spirometry consistent with normal pattern","No overt abnormalities noted given today's efforts"}.  Please see scanned spirometry results for details.  Skin Testing: {Blank single:19197::"Select foods","Environmental allergy panel","Environmental allergy panel and select foods","Food allergy panel","None","Deferred due to recent antihistamines use"}. *** Results discussed with patient/family.   Medication List:  Current Outpatient Medications  Medication Sig Dispense Refill  . albuterol (VENTOLIN HFA) 108 (90 Base) MCG/ACT inhaler USE 2 INHALATIONS 15-20 MINUTES APART EVERY 4 HOURS AS NEEDED TO RESCUE ASTHMA 48 g 3  . azelastine (ASTELIN) 0.1 % nasal spray Place 1-2 sprays into both nostrils 2 (two) times daily as needed (nasal drainage). Use in each nostril as directed 30 mL 5  . bisoprolol (ZEBETA) 10 MG tablet Take 1 tablet by mouth once daily for blood pressure 90 tablet 3  . cetirizine (ZYRTEC ALLERGY) 10 MG tablet Take 1 tablet (10 mg total) by mouth daily. 30 tablet 0  . EPINEPHrine (EPIPEN 2-PAK) 0.3 mg/0.3 mL IJ SOAJ injection Inject 0.3 mg into the muscle as needed for anaphylaxis. 2 each 2  . escitalopram (LEXAPRO) 10 MG tablet Take 1 tablet Daily for Mood 90 tablet 3  . fenofibrate micronized (LOFIBRA) 134 MG capsule TAKE 1 CAPSULE BY MOUTH ONCE DAILY FOR  TRIGLYCERIDES  (BLOOD  FATS) 90 capsule 0  . fluticasone furoate-vilanterol (BREO ELLIPTA) 100-25 MCG/ACT AEPB Inhale 1 puff into the lungs daily. Rinse mouth after each use. 60 each 3  . HYDROcodone-acetaminophen (NORCO) 5-325 MG tablet Take 1/2 to 1 tablet every 3 to 4 hours as needed for Severe Pain 10 tablet 0  . meloxicam (MOBIC) 15 MG tablet TAKE 1/2 TO 1 (ONE-HALF TO ONE) TABLET BY MOUTH ONCE DAILY WITH FOOD FOR PAIN AND FOR INFLAMMATION 90 tablet 0  . montelukast (  SINGULAIR) 10 MG tablet Take  1 tablet  Daily for  Allergies                                       /                                                  TAKE                                    BY                             MOUTH                                            ONCE DAILY 90 tablet 3  . omeprazole (PRILOSEC) 20 MG capsule TAKE 1 CAPSULE BY MOUTH ONCE DAILY TO  PREVENT  HEARTBURN  AND  INDIGESTION . 90 capsule 0  . phentermine (ADIPEX-P) 37.5 MG tablet TAKE 1/2 TO 1 (ONE-HALF TO ONE) TABLET BY MOUTH ONCE DAILY IN  THE  MORNING  FOR  DIETING  AND  WEIGHT  LOSS 30 tablet 0  . sildenafil (VIAGRA) 100 MG tablet TAKE ONE TABLET BY MOUTH ONE TIME DAILY AS NEEDED 30 tablet 0  . tamsulosin (FLOMAX) 0.4 MG CAPS capsule TAKE 1 CAPSULE BY MOUTH AT BEDTIME FOR PROSTATE 90 capsule 0  . topiramate (TOPAMAX) 50 MG tablet Take 1/2 to 1 tablet 2 x /day at Suppertime & Bedtime for Dieting & Weight Loss 180 tablet 1  . TRULICITY 3 VO/3.5KK SOPN INJECT 3 MG INTO THE SKIN AS DIRECTED ONCE A WEEK 6 mL 0  . valsartan-hydrochlorothiazide (DIOVAN-HCT) 80-12.5 MG tablet Take 1 tablet by mouth once daily for blood pressure 90 tablet 0   No current facility-administered medications for this visit.   Allergies: Allergies  Allergen Reactions  . Bee Venom Anaphylaxis  . Pine Anaphylaxis  . [place] Nut (Berthollefia [place]) Skin Test     pruritus  . Lisinopril Other (See Comments)    Patient intolerant of drug which caused his tongue to tingle.  . Celebrex [Celecoxib]    I reviewed his past medical history, social history, family history, and environmental history and no significant changes have been reported from his previous visit.  Review of Systems  Constitutional:  Negative for appetite change, chills, fever and unexpected weight change.  HENT:  Positive for postnasal drip and rhinorrhea. Negative for congestion.   Eyes:  Negative for itching.  Respiratory:  Negative for cough, chest tightness, shortness of breath and wheezing.   Cardiovascular:  Negative  for chest pain.  Gastrointestinal:  Negative for abdominal pain.  Genitourinary:  Negative for difficulty urinating.  Skin:  Negative for rash.  Allergic/Immunologic: Positive for environmental allergies and food allergies.   Objective: There were no vitals taken for this visit. There is no height or weight on file to calculate BMI. Physical Exam Vitals and nursing note reviewed.  Constitutional:      Appearance: Normal appearance.  He is well-developed. He is obese.  HENT:     Head: Normocephalic and atraumatic.     Right Ear: Tympanic membrane and external ear normal.     Left Ear: Tympanic membrane and external ear normal.     Nose: Nose normal.     Mouth/Throat:     Mouth: Mucous membranes are moist.     Pharynx: Oropharynx is clear.  Eyes:     Conjunctiva/sclera: Conjunctivae normal.  Cardiovascular:     Rate and Rhythm: Normal rate and regular rhythm.     Heart sounds: Normal heart sounds. No murmur heard.    No friction rub. No gallop.  Pulmonary:     Effort: Pulmonary effort is normal.     Breath sounds: Normal breath sounds. No wheezing, rhonchi or rales.  Musculoskeletal:     Cervical back: Neck supple.  Skin:    General: Skin is warm.     Findings: No rash.  Neurological:     Mental Status: He is alert and oriented to person, place, and time.  Psychiatric:        Behavior: Behavior normal.  Previous notes and tests were reviewed. The plan was reviewed with the patient/family, and all questions/concerned were addressed.  It was my pleasure to see Alton today and participate in his care. Please feel free to contact me with any questions or concerns.  Sincerely,  Rexene Alberts, DO Allergy & Immunology  [facility] office: [place] office: [phone]

## 2022-07-27 ENCOUNTER — Encounter: Payer: Self-pay | Admitting: Allergy

## 2022-07-27 ENCOUNTER — Ambulatory Visit: Payer: Medicare HMO | Admitting: Allergy

## 2022-07-27 ENCOUNTER — Other Ambulatory Visit: Payer: Self-pay

## 2022-07-27 VITALS — BP 126/84 | HR 62 | Temp 98.3°F | Resp 16 | Ht 69.0 in | Wt 244.8 lb

## 2022-07-27 DIAGNOSIS — B999 Unspecified infectious disease: Secondary | ICD-10-CM

## 2022-07-27 DIAGNOSIS — J4540 Moderate persistent asthma, uncomplicated: Secondary | ICD-10-CM | POA: Diagnosis not present

## 2022-07-27 DIAGNOSIS — Z91038 Other insect allergy status: Secondary | ICD-10-CM

## 2022-07-27 DIAGNOSIS — T7800XD Anaphylactic reaction due to unspecified food, subsequent encounter: Secondary | ICD-10-CM

## 2022-07-27 DIAGNOSIS — J3089 Other allergic rhinitis: Secondary | ICD-10-CM | POA: Diagnosis not present

## 2022-07-27 DIAGNOSIS — J302 Other seasonal allergic rhinitis: Secondary | ICD-10-CM

## 2022-07-27 MED ORDER — FLUTICASONE FUROATE-VILANTEROL 200-25 MCG/ACT IN AEPB: 1.0000 | INHALATION_SPRAY | Freq: Every day | RESPIRATORY_TRACT | 3 refills | Status: DC

## 2022-07-27 NOTE — Assessment & Plan Note (Signed)
Past history - Sinus infections once per year but this time it took 3 rounds of antibiotics and 2 courses of prednisone to clear. History of pneumonias. Recently diagnosed with diabetes.  2023 bloodwork normal immunoglobulin, positive titers against diptheria, tetanus and strep pneumoniae. Marland Kitchen Keep track of infections and antibiotics use.

## 2022-07-27 NOTE — Patient Instructions (Addendum)
Breathing Daily controller medication(s): start Breo 267mg 1 puff once a day and rinse mouth after each use.  May use albuterol rescue inhaler 2 puffs every 4 to 6 hours as needed for shortness of breath, chest tightness, coughing, and wheezing. Monitor frequency of use.  Breathing control goals:  Full participation in all desired activities (may need albuterol before activity) Albuterol use two times or less a week on average (not counting use with activity) Cough interfering with sleep two times or less a month Oral steroids no more than once a year No hospitalizations  Food 2023 testing: positive to bBolivianuts, borderline to walnuts, pecan and pistachio.  More likely to have anaphylactic reaction to BBolivianuts, walnuts. Continue strict avoidance of tree nuts. Okay with eating peanuts.   Bee stings 2023 bloodwork positive to honeybee, white faced hornet, yellow jacket, wasp, yellow hornet and bumblebee. Continue to avoid. Recommend starting injections for this first - 3 shots Breathing must be in better control. For mild symptoms you can take over the counter antihistamines such as Benadryl and monitor symptoms closely. If symptoms worsen or if you have severe symptoms including breathing issues, throat closure, significant swelling, whole body hives, severe diarrhea and vomiting, lightheadedness then inject epinephrine and seek immediate medical care afterwards. Action plan in place.   Environmental allergies 2023 skin testing positive to dust mites, mold and cockroach. 2023 bloodwork positive to dust mites, grass, ragweed.  Borderline to cat, cockroach, tree, weed pollen. Continue environmental control measures as below. Continue Singulair (montelukast) '10mg'$  daily at night. Use over the counter antihistamines such as Zyrtec (cetirizine), Claritin (loratadine), Allegra (fexofenadine), or Xyzal (levocetirizine) daily as needed. May take twice a day during allergy flares. May switch  antihistamines every few months. Use Flonase (fluticasone) nasal spray 1 spray per nostril twice a day as needed for nasal congestion.  Use azelastine nasal spray 1-2 sprays per nostril twice a day as needed for runny nose/drainage. Nasal saline spray (i.e., Simply Saline) or nasal saline lavage (i.e., NeilMed) is recommended as needed and prior to medicated nasal sprays. Consider starting injections for this as well after the build up is finished with the bee stings.   Infections Keep track of infections and antibiotics use.  Blood pressure Follow up with PCP regarding this.  Prefer that you don't be on any beta blockers while starting allergy injections.   Follow up in 6 weeks or sooner if needed. Get flu shot and Covid-19 booster.   Reducing Pollen Exposure Pollen seasons: trees (spring), grass (summer) and ragweed/weeds (fall). Keep windows closed in your home and car to lower pollen exposure.  Install air conditioning in the bedroom and throughout the house if possible.  Avoid going out in dry windy days - especially early morning. Pollen counts are highest between 5 - 10 AM and on dry, hot and windy days.  Save outside activities for late afternoon or after a heavy rain, when pollen levels are lower.  Avoid mowing of grass if you have grass pollen allergy. Be aware that pollen can also be transported indoors on people and pets.  Dry your clothes in an automatic dryer rather than hanging them outside where they might collect pollen.  Rinse hair and eyes before bedtime. Mold Control Mold and fungi can grow on a variety of surfaces provided certain temperature and moisture conditions exist.  Outdoor molds grow on plants, decaying vegetation and soil. The major outdoor mold, Alternaria and Cladosporium, are found in very high numbers during hot  and dry conditions. Generally, a late summer - fall peak is seen for common outdoor fungal spores. Rain will temporarily lower outdoor mold  spore count, but counts rise rapidly when the rainy period ends. The most important indoor molds are Aspergillus and Penicillium. Dark, humid and poorly ventilated basements are ideal sites for mold growth. The next most common sites of mold growth are the bathroom and the kitchen. Outdoor (Seasonal) Mold Control Use air conditioning and keep windows closed. Avoid exposure to decaying vegetation. Avoid leaf raking. Avoid grain handling. Consider wearing a face mask if working in moldy areas.  Indoor (Perennial) Mold Control  Maintain humidity below 50%. Get rid of mold growth on hard surfaces with water, detergent and, if necessary, 5% bleach (do not mix with other cleaners). Then dry the area completely. If mold covers an area more than 10 square feet, consider hiring an indoor environmental professional. For clothing, washing with soap and water is best. If moldy items cannot be cleaned and dried, throw them away. Remove sources e.g. contaminated carpets. Repair and seal leaking roofs or pipes. Using dehumidifiers in damp basements may be helpful, but empty the water and clean units regularly to prevent mildew from forming. All rooms, especially basements, bathrooms and kitchens, require ventilation and cleaning to deter mold and mildew growth. Avoid carpeting on concrete or damp floors, and storing items in damp areas. Control of House Dust Mite Allergen Dust mite allergens are a common trigger of allergy and asthma symptoms. While they can be found throughout the house, these microscopic creatures thrive in warm, humid environments such as bedding, upholstered furniture and carpeting. Because so much time is spent in the bedroom, it is essential to reduce mite levels there.  Encase pillows, mattresses, and box springs in special allergen-proof fabric covers or airtight, zippered plastic covers.  Bedding should be washed weekly in hot water (130 F) and dried in a hot dryer. Allergen-proof  covers are available for comforters and pillows that can't be regularly washed.  Wash the allergy-proof covers every few months. Minimize clutter in the bedroom. Keep pets out of the bedroom.  Keep humidity less than 50% by using a dehumidifier or air conditioning. You can buy a humidity measuring device called a hygrometer to monitor this.  If possible, replace carpets with hardwood, linoleum, or washable area rugs. If that's not possible, vacuum frequently with a vacuum that has a HEPA filter. Remove all upholstered furniture and non-washable window drapes from the bedroom. Remove all non-washable stuffed toys from the bedroom.  Wash stuffed toys weekly. Pet Allergen Avoidance: Contrary to popular opinion, there are no "hypoallergenic" breeds of dogs or cats. That is because people are not allergic to an animal's hair, but to an allergen found in the animal's saliva, dander (dead skin flakes) or urine. Pet allergy symptoms typically occur within minutes. For some people, symptoms can build up and become most severe 8 to 12 hours after contact with the animal. People with severe allergies can experience reactions in public places if dander has been transported on the pet owners' clothing. Keeping an animal outdoors is only a partial solution, since homes with pets in the yard still have higher concentrations of animal allergens. Before getting a pet, ask your allergist to determine if you are allergic to animals. If your pet is already considered part of your family, try to minimize contact and keep the pet out of the bedroom and other rooms where you spend a great deal of time. As  with dust mites, vacuum carpets often or replace carpet with a hardwood floor, tile or linoleum. High-efficiency particulate air (HEPA) cleaners can reduce allergen levels over time. While dander and saliva are the source of cat and dog allergens, urine is the source of allergens from rabbits, hamsters, mice and [place] pigs;  so ask a non-allergic family member to clean the animal's cage. If you have a pet allergy, talk to your allergist about the potential for allergy immunotherapy (allergy shots). This strategy can often provide long-term relief. Cockroach Allergen Avoidance Cockroaches are often found in the homes of densely populated urban areas, schools or commercial buildings, but these creatures can lurk almost anywhere. This does not mean that you have a dirty house or living area. Block all areas where roaches can enter the home. This includes crevices, wall cracks and windows.  Cockroaches need water to survive, so fix and seal all leaky faucets and pipes. Have an exterminator go through the house when your family and pets are gone to eliminate any remaining roaches. Keep food in lidded containers and put pet food dishes away after your pets are done eating. Vacuum and sweep the floor after meals, and take out garbage and recyclables. Use lidded garbage containers in the kitchen. Wash dishes immediately after use and clean under stoves, refrigerators or toasters where crumbs can accumulate. Wipe off the stove and other kitchen surfaces and cupboards regularly.

## 2022-07-27 NOTE — Assessment & Plan Note (Deleted)
Past history - Gets wheezing, coughing and shortness of breath mainly during URIs. Uses albuterol rarely but during the last URI needed to add on Trelegy for 2 weeks. 2023 CXR unremarkable. 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 Clinically feeling slightly improved.  Interim history - still not back to baseline.   Today's spirometry showed restriction. . Daily controller medication(s): start Breo 268mg 1 puff once a day and rinse mouth after each use.  . May use albuterol rescue inhaler 2 puffs every 4 to 6 hours as needed for shortness of breath, chest tightness, coughing, and wheezing. Monitor frequency of use.   Get flu shot and Covid-19 booster.   Get spirometry at next visit.

## 2022-07-27 NOTE — Assessment & Plan Note (Signed)
Past history - Gets wheezing, coughing and shortness of breath mainly during URIs. Uses albuterol rarely but during the last URI needed to add on Trelegy for 2 weeks. 2023 CXR unremarkable. 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 Clinically feeling slightly improved.  Interim history - still not back to baseline.   Today's spirometry showed restriction. . Daily controller medication(s): start Breo 266mg 1 puff once a day and rinse mouth after each use.  . May use albuterol rescue inhaler 2 puffs every 4 to 6 hours as needed for shortness of breath, chest tightness, coughing, and wheezing. Monitor frequency of use.   Get flu shot and Covid-19 booster.   Get spirometry at next visit.

## 2022-07-27 NOTE — Assessment & Plan Note (Signed)
Past history - Perennial rhinoconjunctivitis symptoms for many years mainly in the spring and fall.  Tried Singulair, Zyrtec and Flonase with some benefit.  No prior ENT evaluation.  2023 CT sinus showed mild mucosal thickening. 2023 skin testing showed: Positive to dust mites, mold and cockroach. 2023 bloodwork positive to dust mites, grass, ragweed.  Borderline to cat, cockroach, tree, weed pollen. Interim history -  Nasal congestion.   Continue environmental control measures as below.  Continue Singulair (montelukast) '10mg'$  daily at night.  Use over the counter antihistamines such as Zyrtec (cetirizine), Claritin (loratadine), Allegra (fexofenadine), or Xyzal (levocetirizine) daily as needed. May take twice a day during allergy flares. May switch antihistamines every few months.  Use Flonase (fluticasone) nasal spray 1 spray per nostril twice a day as needed for nasal congestion.   Use azelastine nasal spray 1-2 sprays per nostril twice a day as needed for runny nose/drainage.  Nasal saline spray (i.e., Simply Saline) or nasal saline lavage (i.e., NeilMed) is recommended as needed and prior to medicated nasal sprays.  Consider starting AIT for environmental allergies after done with VIT build up.

## 2022-07-27 NOTE — Assessment & Plan Note (Signed)
Past history - Upper body itching after eating mixed nuts in December 2023. Has eaten peanut butter since then with no issues. 2023 skin testing showed: Positive to [place] nuts. 2023 bloodwork positive to [place] nuts, borderline to walnuts, pecan and pistachio.  More likely to have anaphylactic reaction to [place] nuts, walnuts. . Continue strict avoidance of tree nuts. Faythe Ghee to eat peanuts.  . For mild symptoms you can take over the counter antihistamines such as Benadryl and monitor symptoms closely. If symptoms worsen or if you have severe symptoms including breathing issues, throat closure, significant swelling, whole body hives, severe diarrhea and vomiting, lightheadedness then inject epinephrine and seek immediate medical care afterwards.

## 2022-07-27 NOTE — Assessment & Plan Note (Signed)
Past history - Went to ER few times after stings due to hives and swelling. Never required Epi. 2023 bloodwork positive to honeybee, white faced hornet, yellow jacket, wasp, yellow hornet and bumblebee. Interim history - no stings. Wants to start VIT next year.  . Continue to avoid. Marland Kitchen Recommend starting injections for this first - 3 shots o Breathing must be in better control. . For mild symptoms you can take over the counter antihistamines such as Benadryl and monitor symptoms closely. If symptoms worsen or if you have severe symptoms including breathing issues, throat closure, significant swelling, whole body hives, severe diarrhea and vomiting, lightheadedness then inject epinephrine and seek immediate medical care afterwards. . Action plan in place.

## 2022-07-29 ENCOUNTER — Telehealth: Payer: Self-pay | Admitting: Nurse Practitioner

## 2022-07-29 ENCOUNTER — Encounter: Payer: Self-pay | Admitting: Nurse Practitioner

## 2022-07-29 NOTE — Telephone Encounter (Signed)
Patient is requesting a refill on his Trulicity and would like to increase to the next dose. [facility] [place]

## 2022-07-31 ENCOUNTER — Other Ambulatory Visit: Payer: Self-pay | Admitting: Internal Medicine

## 2022-07-31 MED ORDER — TRULICITY 4.5 MG/0.5ML ~~LOC~~ SOAJ: SUBCUTANEOUS | 3 refills | Status: DC

## 2022-08-10 NOTE — Progress Notes (Signed)
ANNUAL WELLNESS VISIT AND 3 MONTH FOLLOW UP  Assessment:   Blayden was seen today for welcome to medicare wellness and follow-up.  Diagnoses and all orders for this visit:  Encounter for Medicare Annual Wellness Exam Yearly for annual wellness  Essential hypertension       Decrease bisoprolol to 5 mg QD and monitor BP - Blood pressure has been running too low on Bisoprolol 10 mg and has been getting dizzy Monitor blood pressure at home; call if consistently over 130/80 Continue DASH diet.   Reminder to go to the ER if any CP, SOB, nausea, dizziness, severe HA, changes vision/speech, left arm numbness and tingling and jaw pain. -     CBC with Differential/Platelet -     COMPLETE METABOLIC PANEL WITH GFR  Hyperlipidemia, mixed Continue medications: fenofibrate 175m Discussed dietary and exercise modifications Low fat diet -     Lipid panel  Thoracic aortic atherosclerosis By Chest CT 2020(HCC) Monitor BP, Weight , cholesterol and blood sugars.   Vitamin D deficiency Continue supplementation to maintain goal of 70-100 Taking Vitamin D IU daily  Testosterone deficiency No supplementation at this time.  BPH with obstruction/lower urinary tract symptoms Continues to wake up 1-3 times a night but had erectile issues when used finasteride and Tamsulosin Continue medications: Tamsulosin Will continue to monitor  Fatty liver Discussed dietary and exercise modifications  Seasonal allergic rhinitis due to pollen Continue fexofenadine  Medication management Monitor   OSA treated with BiPap Reports wears nightly Discussed hygiene regarding equipment  Morbid Obesity (BMI 36) Fair life protein shakes Eat more frequently - try not to go more than 6 hours without protein Aim for 90 grams of protein a day- 30 breakfast/30 lunch 30 dinner Try to keep net carbs less than 50 Net Carbs=Total Carbs-fiber- sugar alcohols Exercise heartrate 120-140(fat burning zone)- walking 20-30  minutes 4 days a week  Discussed dietary and exercise modifications Continue Phentermine  Abnormal Glucose Continue diet and exercise A1C  Former smoker - CT chest lung cancer screening low dose  Over 30 minutes of face to face interview, exam, counseling, chart review, and critical decision making was performed  Future Appointments  Date Time Provider DLake Bosworth 09/02/2022  8:30 AM KGarnet Sierras DO [facility] None  09/29/2022  8:45 AM LUbaldo Glassing Amy, NP [facility] None  10/14/2022  9:30 AM MUnk Pinto MD [facility] None  02/23/2023  3:00 PM MUnk Pinto MD [facility] None     Plan:   During the course of the visit the patient was educated and counseled about appropriate screening and preventive services including:   Pneumococcal vaccine  Influenza vaccine Prevnar 13 Td vaccine Screening electrocardiogram Colorectal cancer screening Diabetes screening Glaucoma screening Nutrition counseling    Subjective:  George PRYDEis a 66y.o. male who presents for Medicare Annual Wellness Visit and 3 month follow up for HTN, HLD, BPH, prediabetes, and vitamin D Def.   His sinuses have been bothering him all year.  He had a bone graft a week ago due to bone loss. He continues to have some pain.  Is following with asthma doctor and ENT   He did have a fall and hurt right rib end of AUgust.  Has now resolved  BMI is Body mass index is 36.21 kg/m., he has been working on diet and exercise. He has been trying to lose weight. He is a stress eater.  He is trying to eat less simple carbs and has been limiting wine  to once a week . He had been riding bike 30 miles/ week- is an e bike. He has tried Phentermine but did not help him lose weight Wt Readings from Last 3 Encounters:  08/11/22 248 lb 12.8 oz (112.9 kg)  07/27/22 244 lb 12.8 oz (111 kg)  07/06/22 250 lb (113.4 kg)     His blood pressure has been controlled at home , today their BP is BP: (!) 110/58 BP Readings from  Last 3 Encounters:  08/11/22 (!) 110/58  07/27/22 126/84  07/06/22 (!) 142/74    He does workout. He denies chest pain, shortness of breath, dizziness.  He is on cholesterol medication, fenofibrate, and denies myalgias. His cholesterol is at goal. The cholesterol last visit was:   Lab Results  Component Value Date   CHOL 153 02/18/2022   HDL 32 (L) 02/18/2022   LDLCALC 79 02/18/2022   TRIG 352 (H) 02/18/2022   CHOLHDL 4.8 02/18/2022   He has been working on diet and exercise for prediabetes, and denies polydipsia, polyuria, visual disturbances, vomiting and weight loss. Last A1C in the office was:  Lab Results  Component Value Date   HGBA1C 6.5 (H) 02/18/2022   Last GFR- needs to drink more water Lab Results  Component Value Date   EGFR 75 02/18/2022      Lab Results  Component Value Date   GFRAA 98 02/17/2021   Patient is on Vitamin D supplement.   Lab Results  Component Value Date   VD25OH 53 02/18/2022     BPH Currently he is symptomatic and endorses nocturia x 1-3 a night and denies urinary hesitancy, incomplete voiding, double voiding, weak stream, perineal discomfort, dysuria, hematuria, abdominal pain, flank pain and testicular pain.  He is taking Flomax.    He is not following with urology at this time and last results were in normal. Lab Results  Component Value Date   PSA 1.32 02/18/2022    Bone spurs removed from right shoulder in 2/22 doing well.  Bone spur removed from left index finger by [facility] 03/14/21, got infected, had wound that needed dressing throughout the summer.  Lost some bone in first digit of left hand so has some hypermobility of the joint. . Continues to have pain.  Bone spur on right elbow, following with [facility]. Continues to have pain.     Medication Review:  Current Outpatient Medications (Endocrine & Metabolic):    Dulaglutide (TRULICITY) 4.5 NF/6.2ZH SOPN, Inject 4.5 mg into Skin every 7 days for Diabetes               (Dx: e11.29)  Current Outpatient Medications (Cardiovascular):    bisoprolol (ZEBETA) 5 MG tablet, Take 1 tablet (5 mg total) by mouth daily.   EPINEPHrine (EPIPEN 2-PAK) 0.3 mg/0.3 mL IJ SOAJ injection, Inject 0.3 mg into the muscle as needed for anaphylaxis.   fenofibrate micronized (LOFIBRA) 134 MG capsule, TAKE 1 CAPSULE BY MOUTH ONCE DAILY FOR  TRIGLYCERIDES  (BLOOD  FATS)   sildenafil (VIAGRA) 100 MG tablet, TAKE ONE TABLET BY MOUTH ONE TIME DAILY AS NEEDED   valsartan-hydrochlorothiazide (DIOVAN-HCT) 80-12.5 MG tablet, Take 1 tablet by mouth daily. for blood pressure  Current Outpatient Medications (Respiratory):    albuterol (VENTOLIN HFA) 108 (90 Base) MCG/ACT inhaler, USE 2 INHALATIONS 15-20 MINUTES APART EVERY 4 HOURS AS NEEDED TO RESCUE ASTHMA   azelastine (ASTELIN) 0.1 % nasal spray, Place 1-2 sprays into both nostrils 2 (two) times daily as needed (nasal drainage).  Use in each nostril as directed   cetirizine (ZYRTEC ALLERGY) 10 MG tablet, Take 1 tablet (10 mg total) by mouth daily.   fluticasone furoate-vilanterol (BREO ELLIPTA) 200-25 MCG/ACT AEPB, Inhale 1 puff into the lungs daily. Rinse mouth after each use.   montelukast (SINGULAIR) 10 MG tablet, Take  1 tablet  Daily for Allergies                                       /                                                  TAKE                                    BY                             MOUTH                                            ONCE DAILY  Current Outpatient Medications (Analgesics):    HYDROcodone-acetaminophen (NORCO) 5-325 MG tablet, Take 1/2 to 1 tablet every 3 to 4 hours as needed for Severe Pain   meloxicam (MOBIC) 15 MG tablet, TAKE 1/2 TO 1 (ONE-HALF TO ONE) TABLET BY MOUTH ONCE DAILY WITH FOOD FOR PAIN AND FOR INFLAMMATION   Current Outpatient Medications (Other):    Cholecalciferol (VITAMIN D) 125 MCG (5000 UT) CAPS, Take by mouth.   escitalopram (LEXAPRO) 10 MG tablet, Take 1 tablet Daily for Mood    omeprazole (PRILOSEC) 20 MG capsule, TAKE 1 CAPSULE BY MOUTH ONCE DAILY TO  PREVENT  HEARTBURN  AND  INDIGESTION .   phentermine (ADIPEX-P) 37.5 MG tablet, TAKE 1/2 TO 1 (ONE-HALF TO ONE) TABLET BY MOUTH ONCE DAILY IN  THE  MORNING  FOR  DIETING  AND  WEIGHT  LOSS   tamsulosin (FLOMAX) 0.4 MG CAPS capsule, TAKE 1 CAPSULE BY MOUTH AT BEDTIME FOR PROSTATE  Allergies: Allergies  Allergen Reactions   Bee Venom Anaphylaxis   Pine Anaphylaxis   [place] Nut (Berthollefia [place]) Skin Test     pruritus   Lisinopril Other (See Comments)    Patient intolerant of drug which caused his tongue to tingle.   Penicillins Itching   Celebrex [Celecoxib]     Current Problems (verified) has Essential hypertension; Cluster headaches; OSA treated with BiPAP; Vitamin D deficiency; Testosterone deficiency; Morbid obesity ([place]); Hyperlipidemia, mixed; Screening for colorectal cancer; Dyspnea; Trigger finger, right ring finger; Bilateral hand pain; Depression; Chronic pain of both shoulders; Insomnia; Gastroesophageal reflux disease; Fatty liver; Benign prostatic hyperplasia (BPH) with post-void dribbling; Thoracic aortic atherosclerosis ([place]) by Chest CT scan on10/21/2020.; Insulin resistance; Seasonal and perennial allergic rhinitis; Anaphylactic reaction due to food, subsequent encounter; Recurrent infections; Hymenoptera allergy; and Moderate persistent asthma without complication on their problem list.  Screening Tests Immunization History  Administered Date(s) Administered   Influenza Inj Mdck Quad With Preservative 08/19/2017, 07/25/2019   Influenza Split 08/06/2015   Influenza, High Dose  Seasonal PF 08/07/2020, 07/15/2021, 08/04/2022   Influenza,inj,quad, With Preservative 08/20/2016, 07/20/2019   Moderna Sars-Covid-2 Vaccination 01/04/2020, 01/25/2020   PFIZER(Purple Top)SARS-COV-2 Vaccination 08/27/2020   PNEUMOCOCCAL CONJUGATE-20 10/31/2021   PPD Test 06/03/2018, 11/28/2019   Pneumococcal  Polysaccharide-23 08/20/2016   Tdap 11/06/2016   Zoster, Live 11/06/2016    Preventative care: Last colonoscopy: 2016 called Dr. Collene Mares last year and she said not due until 2026 but pt wants to schedule this year, will call Chest CT 2020 was normal  Prior vaccinations: TD or Tdap: 2018  Influenza: 08/04/22  Pneumococcal: 2017 Shingles/Zostavax: 2018  Names of Other Physician/Practitioners you currently use: 1. [facility] here for primary care 2. [place], 04/2020 3. Dental Exam Dr Kalman Shan, Peridontal disease Q23month 03/2021 Patient Care Team: MUnk Pinto MD as PCP - General (Internal Medicine) [facility]ew[facility] Pigg [facility] ([facility]armac[facility]   Surgical: He  has a past surgical history that includes Hemorrhoid surgery (09/2012); Appendectomy (07/2012); Hernia repair; and bone spur removal. Family His family history includes Alcoholism in his father; Allergic rhinitis in his brother; Anxiety disorder in his mother; Asthma in his brother; Bronchitis in his brother; Cancer - Colon in his paternal uncle and another family member; Cancer - Prostate in his paternal uncle and another family member; Depression in his mother; Heart disease in his mother; Hyperlipidemia in his mother; Hypertension in his mother; Liver disease in his father; Macular degeneration in his mother; Sinusitis in his brother; Stroke in his maternal aunt. Social history  He reports that he quit smoking about 9 years ago. His smoking use included cigarettes. He has a 40.00 pack-year smoking history. He has never used smokeless tobacco. He reports current alcohol use of about 2.0 standard drinks of alcohol per week. He reports that he does not use drugs.   MEDICARE WELLNESS OBJECTIVES: Physical activity: Current Exercise Habits: Home exercise routine, Type of exercise: walking, Time (Minutes): 30, Frequency (Times/Week): 4, Weekly Exercise (Minutes/Week): 120,  Intensity: Mild, Exercise limited by: orthopedic condition(s) Cardiac risk factors: Cardiac Risk Factors include: advanced age (>553m, >6>22omen);dyslipidemia;hypertension;male gender;obesity (BMI >30kg/m2);smoking/ tobacco exposure Depression/mood screen:      08/11/2022   12:14 PM  Depression screen PHQ 2/9  Decreased Interest 0  Down, Depressed, Hopeless 0  PHQ - 2 Score 0    ADLs:     08/11/2022   12:15 PM 05/04/2022    9:37 PM  In your present state of health, do you have any difficulty performing the following activities:  Hearing? 0 0  Vision? 0 0  Difficulty concentrating or making decisions? 0 0  Walking or climbing stairs? 0 0  Dressing or bathing? 0 0  Doing errands, shopping? 0 0     Cognitive Testing  Alert? Yes  Normal Appearance?Yes  Oriented to person? Yes  Place? Yes   Time? Yes  Recall of three objects?  Yes  Can perform simple calculations? Yes  Displays appropriate judgment?Yes  Can read the correct time from a watch face?Yes  EOL planning: Does Patient Have a Medical Advance Directive?: Yes Type of Advance Directive: Healthcare Power of Attorney, Living will Does patient want to make changes to medical advance directive?: No - Patient declined Copy of [place] Chart?: No - copy requested   Objective:   Today's Vitals   08/11/22 1134  BP: (!) 110/58  Pulse: 67  Temp: (!) 97.5 F (36.4 C)  SpO2: 96%  Weight: 248 lb 12.8 oz (  112.9 kg)  Height: 5' 9.5" (1.765 m)     Body mass index is 36.21 kg/m.  General appearance: obese pleasant male who is alert, no distress, WD/WN HEENT: normocephalic, sclerae anicteric, TMs pearly, nares patent, no discharge or erythema, pharynx normal Oral cavity: MMM, no lesions Neck: supple, no lymphadenopathy, no thyromegaly, no masses Heart: RRR, normal S1, S2, no murmurs Lungs: CTA bilaterally, no wheezes, rhonchi, or rales Abdomen: +bs, soft, non tender, non distended, no masses, no  hepatomegaly, no splenomegaly. diastasis with hernia noted. Musculoskeletal: nontender, no swelling, no obvious deformity Extremities: no edema, no cyanosis, no clubbing. Diabetic foot exam no deformities, PT & DP pulses 2+ bilaterally, intact touch Pulses: 2+ symmetric, upper and lower extremities, normal cap refill Neurological: alert, oriented x 3, CN2-12 intact, strength normal upper extremities and lower extremities, sensation normal throughout, DTRs 2+ throughout, no cerebellar signs, gait normal Psychiatric: normal affect, behavior normal, pleasant     Medicare Attestation I have personally reviewed: The patient's medical and social history Their use of alcohol, tobacco or illicit drugs Their current medications and supplements The patient's functional ability including ADLs,fall risks, home safety risks, cognitive, and hearing and visual impairment Diet and physical activities Evidence for depression or mood disorders  The patient's weight, height, BMI, and visual acuity have been recorded in the chart.  I have made referrals, counseling, and provided education to the patient based on review of the above and I have provided the patient with a written personalized care plan for preventive services.      Magda Bernheim ANP-C  [facility]  08/11/2022

## 2022-08-11 ENCOUNTER — Ambulatory Visit (INDEPENDENT_AMBULATORY_CARE_PROVIDER_SITE_OTHER): Payer: Medicare HMO | Admitting: Nurse Practitioner

## 2022-08-11 ENCOUNTER — Encounter: Payer: Self-pay | Admitting: Nurse Practitioner

## 2022-08-11 VITALS — BP 110/58 | HR 67 | Temp 97.5°F | Ht 69.5 in | Wt 248.8 lb

## 2022-08-11 DIAGNOSIS — E559 Vitamin D deficiency, unspecified: Secondary | ICD-10-CM

## 2022-08-11 DIAGNOSIS — R69 Illness, unspecified: Secondary | ICD-10-CM | POA: Diagnosis not present

## 2022-08-11 DIAGNOSIS — I700 Atherosclerosis of aorta: Secondary | ICD-10-CM

## 2022-08-11 DIAGNOSIS — R6889 Other general symptoms and signs: Secondary | ICD-10-CM

## 2022-08-11 DIAGNOSIS — G4733 Obstructive sleep apnea (adult) (pediatric): Secondary | ICD-10-CM

## 2022-08-11 DIAGNOSIS — Z0001 Encounter for general adult medical examination with abnormal findings: Secondary | ICD-10-CM

## 2022-08-11 DIAGNOSIS — E782 Mixed hyperlipidemia: Secondary | ICD-10-CM

## 2022-08-11 DIAGNOSIS — K760 Fatty (change of) liver, not elsewhere classified: Secondary | ICD-10-CM | POA: Diagnosis not present

## 2022-08-11 DIAGNOSIS — R7309 Other abnormal glucose: Secondary | ICD-10-CM | POA: Diagnosis not present

## 2022-08-11 DIAGNOSIS — J3089 Other allergic rhinitis: Secondary | ICD-10-CM

## 2022-08-11 DIAGNOSIS — F329 Major depressive disorder, single episode, unspecified: Secondary | ICD-10-CM

## 2022-08-11 DIAGNOSIS — Z6836 Body mass index (BMI) 36.0-36.9, adult: Secondary | ICD-10-CM

## 2022-08-11 DIAGNOSIS — Z87891 Personal history of nicotine dependence: Secondary | ICD-10-CM

## 2022-08-11 DIAGNOSIS — I10 Essential (primary) hypertension: Secondary | ICD-10-CM

## 2022-08-11 DIAGNOSIS — J302 Other seasonal allergic rhinitis: Secondary | ICD-10-CM

## 2022-08-11 DIAGNOSIS — E349 Endocrine disorder, unspecified: Secondary | ICD-10-CM | POA: Diagnosis not present

## 2022-08-11 DIAGNOSIS — Z79899 Other long term (current) drug therapy: Secondary | ICD-10-CM | POA: Diagnosis not present

## 2022-08-11 DIAGNOSIS — Z0000 Encounter for general adult medical examination without abnormal findings: Secondary | ICD-10-CM

## 2022-08-11 MED ORDER — LOSARTAN POTASSIUM 50 MG PO TABS: 50.0000 mg | ORAL_TABLET | Freq: Every day | ORAL | 11 refills | Status: DC

## 2022-08-11 MED ORDER — VALSARTAN-HYDROCHLOROTHIAZIDE 80-12.5 MG PO TABS: 1.0000 | ORAL_TABLET | Freq: Every day | ORAL | 0 refills | Status: DC

## 2022-08-11 MED ORDER — BISOPROLOL FUMARATE 5 MG PO TABS: 5.0000 mg | ORAL_TABLET | Freq: Every day | ORAL | 3 refills | Status: DC

## 2022-08-11 NOTE — Patient Instructions (Addendum)
Fair life protein shakes Eat more frequently - try not to go more than 6 hours without protein Aim for 90 grams of protein a day- 30 breakfast/30 lunch 30 dinner Try to keep net carbs less than 50 Net Carbs=Total Carbs-fiber- sugar alcohols Exercise heartrate 120-140(fat burning zone)- walking 20-30 minutes 4 days a week   Check BP several times a week- if BP is running consistently greater than 130/80  or BP is less than 110/60 consistently notify the office Cut bisoprolol in half to take 5 mg daily     [place]   Know what a healthy weight is for you (roughly BMI <25) and aim to maintain this   Aim for 7+ servings of fruits and vegetables daily   70-80+ fluid ounces of water or unsweet tea for healthy kidneys   Limit to max 1 drink of alcohol per day; avoid smoking/tobacco   Limit animal fats in diet for cholesterol and heart health - choose grass fed whenever available   Avoid highly processed foods, and foods high in saturated/trans fats   Aim for low stress - take time to unwind and care for your mental health   Aim for 150 min of moderate intensity exercise weekly for heart health, and weights twice weekly for bone health   Aim for 7-9 hours of sleep daily

## 2022-08-12 LAB — COMPLETE METABOLIC PANEL WITH GFR
AG Ratio: 1.6 (calc) (ref 1.0–2.5)
ALT: 33 U/L (ref 9–46)
AST: 22 U/L (ref 10–35)
Albumin: 4.4 g/dL (ref 3.6–5.1)
Alkaline phosphatase (APISO): 69 U/L (ref 35–144)
BUN: 25 mg/dL (ref 7–25)
CO2: 29 mmol/L (ref 20–32)
Calcium: 9.5 mg/dL (ref 8.6–10.3)
Chloride: 102 mmol/L (ref 98–110)
Creat: 1.07 mg/dL (ref 0.70–1.35)
Globulin: 2.7 g/dL (calc) (ref 1.9–3.7)
Glucose, Bld: 121 mg/dL — ABNORMAL HIGH (ref 65–99)
Potassium: 4.6 mmol/L (ref 3.5–5.3)
Sodium: 139 mmol/L (ref 135–146)
Total Bilirubin: 0.4 mg/dL (ref 0.2–1.2)
Total Protein: 7.1 g/dL (ref 6.1–8.1)
eGFR: 76 mL/min/{1.73_m2} (ref >=60)

## 2022-08-12 LAB — CBC WITH DIFFERENTIAL/PLATELET
Absolute Monocytes: 546 cells/uL (ref 200–950)
Basophils Absolute: 49 cells/uL (ref 0–200)
Basophils Relative: 0.7 %
Eosinophils Absolute: 112 cells/uL (ref 15–500)
Eosinophils Relative: 1.6 %
HCT: 45.2 % (ref 38.5–50.0)
Hemoglobin: 15.3 g/dL (ref 13.2–17.1)
Lymphs Abs: 1792 cells/uL (ref 850–3900)
MCH: 31.5 pg (ref 27.0–33.0)
MCHC: 33.8 g/dL (ref 32.0–36.0)
MCV: 93.2 fL (ref 80.0–100.0)
MPV: 10.6 fL (ref 7.5–12.5)
Monocytes Relative: 7.8 %
Neutro Abs: 4501 cells/uL (ref 1500–7800)
Neutrophils Relative %: 64.3 %
Platelets: 221 10*3/uL (ref 140–400)
RBC: 4.85 10*6/uL (ref 4.20–5.80)
RDW: 12.4 % (ref 11.0–15.0)
Total Lymphocyte: 25.6 %
WBC: 7 10*3/uL (ref 3.8–10.8)

## 2022-08-12 LAB — HEMOGLOBIN A1C
Hgb A1c MFr Bld: 5.9 % of total Hgb — ABNORMAL HIGH (ref <5.7)
Mean Plasma Glucose: 123 mg/dL
eAG (mmol/L): 6.8 mmol/L

## 2022-08-12 LAB — LIPID PANEL
Cholesterol: 143 mg/dL (ref <200)
HDL: 32 mg/dL — ABNORMAL LOW (ref >=40)
LDL Cholesterol (Calc): 77 mg/dL (calc)
Non-HDL Cholesterol (Calc): 111 mg/dL (calc) (ref <130)
Total CHOL/HDL Ratio: 4.5 (calc) (ref <5.0)
Triglycerides: 261 mg/dL — ABNORMAL HIGH (ref <150)

## 2022-08-13 ENCOUNTER — Other Ambulatory Visit: Payer: Self-pay | Admitting: Internal Medicine

## 2022-08-17 DIAGNOSIS — G4733 Obstructive sleep apnea (adult) (pediatric): Secondary | ICD-10-CM | POA: Diagnosis not present

## 2022-08-28 ENCOUNTER — Telehealth: Payer: Self-pay | Admitting: Nurse Practitioner

## 2022-08-28 ENCOUNTER — Other Ambulatory Visit: Payer: Self-pay | Admitting: Nurse Practitioner

## 2022-08-28 DIAGNOSIS — N138 Other obstructive and reflux uropathy: Secondary | ICD-10-CM

## 2022-08-28 MED ORDER — OMEPRAZOLE 20 MG PO CPDR: 20.0000 mg | DELAYED_RELEASE_CAPSULE | Freq: Every day | ORAL | 0 refills | Status: DC

## 2022-08-28 MED ORDER — TAMSULOSIN HCL 0.4 MG PO CAPS: ORAL_CAPSULE | ORAL | 0 refills | Status: DC

## 2022-08-28 MED ORDER — FENOFIBRATE 134 MG PO CAPS: ORAL_CAPSULE | ORAL | 0 refills | Status: DC

## 2022-08-28 NOTE — Telephone Encounter (Signed)
Pt is requesting refill on Fenofibrate, Omeprazole, and Tamsulosin to go to a new pharm: [facility] [address]

## 2022-09-01 NOTE — Progress Notes (Unsigned)
Follow Up Note  RE: SORIN FRIMPONG MRN: [id number] DOB: [birth year] Date of Office Visit: 09/02/2022  Referring provider: Unk Pinto, MD Primary care provider: Unk Pinto, MD  Chief Complaint: No chief complaint on file.  History of Present Illness: I had the pleasure of seeing George Montgomery for a follow up visit at the [facility] [place] on 09/01/2022. He is a 66 y.o. male, who is being followed for asthma, hymenoptera allergy, allergic rhinitis, food allergy and recurrent infections. His previous allergy office visit was on 07/27/2022 with Dr. Maudie Mercury. Today is a regular follow up visit.  Moderate persistent asthma without complication Past history - Gets wheezing, coughing and shortness of breath mainly during URIs. Uses albuterol rarely but during the last URI needed to add on Trelegy for 2 weeks. 2023 CXR unremarkable. 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 Clinically feeling slightly improved.  Interim history - still not back to baseline.  Today's spirometry showed restriction. Daily controller medication(s): start Breo 217mg 1 puff once a day and rinse mouth after each use.  May use albuterol rescue inhaler 2 puffs every 4 to 6 hours as needed for shortness of breath, chest tightness, coughing, and wheezing. Monitor frequency of use.  Get flu shot and Covid-19 booster.  Get spirometry at next visit.   Hymenoptera allergy Past history - Went to ER few times after stings due to hives and swelling. Never required Epi. 2023 bloodwork positive to honeybee, white faced hornet, yellow jacket, wasp, yellow hornet and bumblebee. Interim history - no stings. Wants to start VIT next year.  Continue to avoid. Recommend starting injections for this first - 3 shots Breathing must be in better control. For mild symptoms you can take over the counter antihistamines such as  Benadryl and monitor symptoms closely. If symptoms worsen or if you have severe symptoms including breathing issues, throat closure, significant swelling, whole body hives, severe diarrhea and vomiting, lightheadedness then inject epinephrine and seek immediate medical care afterwards. Action plan in place.    Seasonal and perennial allergic rhinitis Past history - Perennial rhinoconjunctivitis symptoms for many years mainly in the spring and fall.  Tried Singulair, Zyrtec and Flonase with some benefit.  No prior ENT evaluation.  2023 CT sinus showed mild mucosal thickening. 2023 skin testing showed: Positive to dust mites, mold and cockroach. 2023 bloodwork positive to dust mites, grass, ragweed.  Borderline to cat, cockroach, tree, weed pollen. Interim history -  Nasal congestion.  Continue environmental control measures as below. Continue Singulair (montelukast) '10mg'$  daily at night. Use over the counter antihistamines such as Zyrtec (cetirizine), Claritin (loratadine), Allegra (fexofenadine), or Xyzal (levocetirizine) daily as needed. May take twice a day during allergy flares. May switch antihistamines every few months. Use Flonase (fluticasone) nasal spray 1 spray per nostril twice a day as needed for nasal congestion.  Use azelastine nasal spray 1-2 sprays per nostril twice a day as needed for runny nose/drainage. Nasal saline spray (i.e., Simply Saline) or nasal saline lavage (i.e., NeilMed) is recommended as needed and prior to medicated nasal sprays. Consider starting AIT for environmental allergies after done with VIT build up.   Anaphylactic reaction due to food, subsequent encounter Past history - Upper body itching after eating mixed nuts in December 2023. Has eaten peanut butter since then with no issues. 2023 skin testing showed: Positive to bBolivianuts. 2023 bloodwork positive to bBolivianuts, borderline  to walnuts, pecan and pistachio.  More likely to have anaphylactic reaction to  [place] nuts, walnuts. Continue strict avoidance of tree nuts. Okay to eat peanuts.  For mild symptoms you can take over the counter antihistamines such as Benadryl and monitor symptoms closely. If symptoms worsen or if you have severe symptoms including breathing issues, throat closure, significant swelling, whole body hives, severe diarrhea and vomiting, lightheadedness then inject epinephrine and seek immediate medical care afterwards.   Recurrent infections Past history - Sinus infections once per year but this time it took 3 rounds of antibiotics and 2 courses of prednisone to clear. History of pneumonias. Recently diagnosed with diabetes.  2023 bloodwork normal immunoglobulin, positive titers against diptheria, tetanus and strep pneumoniae. Keep track of infections and antibiotics use.   Return in about 6 weeks (around 09/07/2022).   Blood pressure Follow up with PCP regarding this.  Prefer that you don't be on any beta blockers while starting allergy injections.   Assessment and Plan: Usiel is a 66 y.o. male with: No problem-specific Assessment & Plan notes found for this encounter.  No follow-ups on file.  No orders of the defined types were placed in this encounter.  Lab Orders  No laboratory test(s) ordered today    Diagnostics: Spirometry:  Tracings reviewed. His effort: {Blank single:19197::"Good reproducible efforts.","It was hard to get consistent efforts and there is a question as to whether this reflects a maximal maneuver.","Poor effort, data can not be interpreted."} FVC: ***L FEV1: ***L, ***% predicted FEV1/FVC ratio: ***% Interpretation: {Blank single:19197::"Spirometry consistent with mild obstructive disease","Spirometry consistent with moderate obstructive disease","Spirometry consistent with severe obstructive disease","Spirometry consistent with possible restrictive disease","Spirometry consistent with mixed obstructive and restrictive disease","Spirometry  uninterpretable due to technique","Spirometry consistent with normal pattern","No overt abnormalities noted given today's efforts"}.  Please see scanned spirometry results for details.  Skin Testing: {Blank single:19197::"Select foods","Environmental allergy panel","Environmental allergy panel and select foods","Food allergy panel","None","Deferred due to recent antihistamines use"}. *** Results discussed with patient/family.   Medication List:  Current Outpatient Medications  Medication Sig Dispense Refill  . albuterol (VENTOLIN HFA) 108 (90 Base) MCG/ACT inhaler USE 2 INHALATIONS 15-20 MINUTES APART EVERY 4 HOURS AS NEEDED TO RESCUE ASTHMA 48 g 3  . azelastine (ASTELIN) 0.1 % nasal spray Place 1-2 sprays into both nostrils 2 (two) times daily as needed (nasal drainage). Use in each nostril as directed 30 mL 5  . bisoprolol (ZEBETA) 5 MG tablet Take 1 tablet (5 mg total) by mouth daily. 90 tablet 3  . cetirizine (ZYRTEC ALLERGY) 10 MG tablet Take 1 tablet (10 mg total) by mouth daily. 30 tablet 0  . Cholecalciferol (VITAMIN D) 125 MCG (5000 UT) CAPS Take by mouth.    . Dulaglutide (TRULICITY) 4.5 YB/0.1BP SOPN Inject 4.5 mg into Skin every 7 days for Diabetes              (Dx: e11.29) 6 mL 3  . EPINEPHrine (EPIPEN 2-PAK) 0.3 mg/0.3 mL IJ SOAJ injection Inject 0.3 mg into the muscle as needed for anaphylaxis. 2 each 2  . escitalopram (LEXAPRO) 10 MG tablet Take 1 tablet Daily for Mood 90 tablet 3  . fenofibrate micronized (LOFIBRA) 134 MG capsule TAKE 1 CAPSULE BY MOUTH ONCE DAILY FOR  TRIGLYCERIDES  (BLOOD  FATS) 90 capsule 0  . fluticasone furoate-vilanterol (BREO ELLIPTA) 200-25 MCG/ACT AEPB Inhale 1 puff into the lungs daily. Rinse mouth after each use. 60 each 3  . HYDROcodone-acetaminophen (NORCO) 5-325 MG tablet Take 1/2 to 1  tablet every 3 to 4 hours as needed for Severe Pain 10 tablet 0  . meloxicam (MOBIC) 15 MG tablet TAKE 1/2 TO 1 (ONE-HALF TO ONE) TABLET BY MOUTH ONCE DAILY WITH  FOOD FOR PAIN AND FOR INFLAMMATION 90 tablet 0  . montelukast (SINGULAIR) 10 MG tablet Take  1 tablet  Daily for Allergies                                       /                                                  TAKE                                    BY                             MOUTH                                            ONCE DAILY 90 tablet 3  . omeprazole (PRILOSEC) 20 MG capsule Take 1 capsule (20 mg total) by mouth daily. 90 capsule 0  . phentermine (ADIPEX-P) 37.5 MG tablet TAKE 1 TABLET EVERY MORNING FOR DIETING & WEIGHT LOSS 30 tablet 0  . sildenafil (VIAGRA) 100 MG tablet TAKE ONE TABLET BY MOUTH ONE TIME DAILY AS NEEDED 30 tablet 0  . tamsulosin (FLOMAX) 0.4 MG CAPS capsule TAKE 1 CAPSULE BY MOUTH AT BEDTIME FOR PROSTATE 90 capsule 0  . valsartan-hydrochlorothiazide (DIOVAN-HCT) 80-12.5 MG tablet Take 1 tablet by mouth daily. for blood pressure 90 tablet 0   No current facility-administered medications for this visit.   Allergies: Allergies  Allergen Reactions  . Bee Venom Anaphylaxis  . Pine Anaphylaxis  . [place] Nut (Berthollefia [place]) Skin Test     pruritus  . Lisinopril Other (See Comments)    Patient intolerant of drug which caused his tongue to tingle.  Marland Kitchen Penicillins Itching  . Celebrex [Celecoxib]    I reviewed his past medical history, social history, family history, and environmental history and no significant changes have been reported from his previous visit.  Review of Systems  Constitutional:  Negative for appetite change, chills, fever and unexpected weight change.  HENT:  Positive for congestion.   Eyes:  Negative for itching.  Respiratory:  Positive for cough and wheezing. Negative for chest tightness and shortness of breath.   Cardiovascular:  Negative for chest pain.  Gastrointestinal:  Negative for abdominal pain.  Genitourinary:  Negative for difficulty urinating.  Skin:  Negative for rash.  Allergic/Immunologic: Positive for environmental  allergies and food allergies.   Objective: There were no vitals taken for this visit. There is no height or weight on file to calculate BMI. Physical Exam Vitals and nursing note reviewed.  Constitutional:      Appearance: Normal appearance. He is well-developed. He is obese.  HENT:     Head: Normocephalic and atraumatic.     Right Ear: Tympanic membrane and external  ear normal.     Left Ear: Tympanic membrane and external ear normal.     Nose: Nose normal.     Mouth/Throat:     Mouth: Mucous membranes are moist.     Pharynx: Oropharynx is clear.  Eyes:     Conjunctiva/sclera: Conjunctivae normal.  Cardiovascular:     Rate and Rhythm: Normal rate and regular rhythm.     Heart sounds: Normal heart sounds. No murmur heard.    No friction rub. No gallop.  Pulmonary:     Effort: Pulmonary effort is normal.     Breath sounds: Normal breath sounds. No wheezing, rhonchi or rales.  Musculoskeletal:     Cervical back: Neck supple.  Skin:    General: Skin is warm.     Findings: No rash.  Neurological:     Mental Status: He is alert and oriented to person, place, and time.  Psychiatric:        Behavior: Behavior normal.  Previous notes and tests were reviewed. The plan was reviewed with the patient/family, and all questions/concerned were addressed.  It was my pleasure to see Dyrell today and participate in his care. Please feel free to contact me with any questions or concerns.  Sincerely,  Rexene Alberts, DO Allergy & Immunology  [facility] office: [place] office: [phone]

## 2022-09-02 ENCOUNTER — Ambulatory Visit: Payer: Medicare HMO | Admitting: Allergy

## 2022-09-02 ENCOUNTER — Encounter: Payer: Self-pay | Admitting: Allergy

## 2022-09-02 ENCOUNTER — Other Ambulatory Visit: Payer: Self-pay

## 2022-09-02 VITALS — BP 132/64 | HR 65 | Temp 97.7°F | Resp 16 | Ht 69.0 in | Wt 249.9 lb

## 2022-09-02 DIAGNOSIS — B999 Unspecified infectious disease: Secondary | ICD-10-CM | POA: Diagnosis not present

## 2022-09-02 DIAGNOSIS — J302 Other seasonal allergic rhinitis: Secondary | ICD-10-CM

## 2022-09-02 DIAGNOSIS — Z91038 Other insect allergy status: Secondary | ICD-10-CM

## 2022-09-02 DIAGNOSIS — J3089 Other allergic rhinitis: Secondary | ICD-10-CM

## 2022-09-02 DIAGNOSIS — J4540 Moderate persistent asthma, uncomplicated: Secondary | ICD-10-CM | POA: Diagnosis not present

## 2022-09-02 DIAGNOSIS — T7800XD Anaphylactic reaction due to unspecified food, subsequent encounter: Secondary | ICD-10-CM | POA: Diagnosis not present

## 2022-09-02 MED ORDER — AZELASTINE HCL 0.1 % NA SOLN: 1.0000 | Freq: Two times a day (BID) | NASAL | 5 refills | Status: DC | PRN

## 2022-09-02 NOTE — Assessment & Plan Note (Signed)
Past history - Went to ER few times after stings due to hives and swelling. Never required Epi. 2023 bloodwork positive to honeybee, white faced hornet, yellow jacket, wasp, yellow hornet and bumblebee. Interim history - no stings. Wants to start VIT in January 2024. Recommend starting injections for this first - 3 shots Check with insurance company and make first shot appointment for next year.  For mild symptoms you can take over the counter antihistamines such as Benadryl and monitor symptoms closely. If symptoms worsen or if you have severe symptoms including breathing issues, throat closure, significant swelling, whole body hives, severe diarrhea and vomiting, lightheadedness then inject epinephrine and seek immediate medical care afterwards. Action plan in place.

## 2022-09-02 NOTE — Assessment & Plan Note (Signed)
Past history - Gets wheezing, coughing and shortness of breath mainly during URIs. Uses albuterol rarely but during the last URI needed to add on Trelegy for 2 weeks. 2023 CXR unremarkable. 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 Clinically feeling slightly improved.  Interim history - doing better.  Today's spirometry showed restriction - most likely due to body habitus. Daily controller medication(s): continue Breo 27mg 1 puff once a day and rinse mouth after each use.  May use albuterol rescue inhaler 2 puffs every 4 to 6 hours as needed for shortness of breath, chest tightness, coughing, and wheezing. Monitor frequency of use.  Get Covid-19 booster.  Discussed losing weight - gave handout. Get spirometry at next visit.

## 2022-09-02 NOTE — Assessment & Plan Note (Addendum)
Past history - Perennial rhinoconjunctivitis symptoms for many years mainly in the spring and fall.  Tried Singulair, Zyrtec and Flonase with some benefit.  No prior ENT evaluation.  2023 CT sinus showed mild mucosal thickening. 2023 skin testing showed: Positive to dust mites, mold and cockroach. 2023 bloodwork positive to dust mites, grass, ragweed.  Borderline to cat, cockroach, tree, weed pollen. Interim history -  Nasal congestion still.  Continue environmental control measures as below. Continue Singulair (montelukast) '10mg'$  daily at night. Use over the counter antihistamines such as Zyrtec (cetirizine), Claritin (loratadine), Allegra (fexofenadine), or Xyzal (levocetirizine) daily as needed. May take twice a day during allergy flares. May switch antihistamines every few months. Use Flonase (fluticasone) nasal spray 1 spray per nostril twice a day as needed for nasal congestion.  Use azelastine nasal spray 1-2 sprays per nostril twice a day as needed for runny nose/drainage. Nasal saline spray (i.e., Simply Saline) or nasal saline lavage (i.e., NeilMed) is recommended as needed and prior to medicated nasal sprays. If worsening symptoms - will refer to ENT next. Consider adding on environmental injections after finished build up with the venoms.

## 2022-09-02 NOTE — Assessment & Plan Note (Signed)
Past history - Upper body itching after eating mixed nuts in December 2023. Has eaten peanut butter since then with no issues. 2023 skin testing showed: Positive to [place] nuts. 2023 bloodwork positive to brazil nuts, borderline to walnuts, pecan and pistachio.  More likely to have anaphylactic reaction to [place] nuts, walnuts. Continue strict avoidance of tree nuts. For mild symptoms you can take over the counter antihistamines such as Benadryl and monitor symptoms closely. If symptoms worsen or if you have severe symptoms including breathing issues, throat closure, significant swelling, whole body hives, severe diarrhea and vomiting, lightheadedness then inject epinephrine and seek immediate medical care afterwards. 

## 2022-09-02 NOTE — Patient Instructions (Addendum)
Breathing Breathing test similar to last one.  Daily controller medication(s): continue Breo 216mg 1 puff once a day and rinse mouth after each use.  May use albuterol rescue inhaler 2 puffs every 4 to 6 hours as needed for shortness of breath, chest tightness, coughing, and wheezing. Monitor frequency of use.  Breathing control goals:  Full participation in all desired activities (may need albuterol before activity) Albuterol use two times or less a week on average (not counting use with activity) Cough interfering with sleep two times or less a month Oral steroids no more than once a year No hospitalizations  Food 2023 testing: positive to bBolivianuts, borderline to walnuts, pecan and pistachio.  More likely to have anaphylactic reaction to BBolivianuts, walnuts. Continue strict avoidance of tree nuts. Okay with eating peanuts.   Bee stings 2023 bloodwork positive to honeybee, white faced hornet, yellow jacket, wasp, yellow hornet and bumblebee. Continue to avoid. Recommend starting injections for this first - 3 shots Check with insurance company and make first shot appointment for next year.  For mild symptoms you can take over the counter antihistamines such as Benadryl and monitor symptoms closely. If symptoms worsen or if you have severe symptoms including breathing issues, throat closure, significant swelling, whole body hives, severe diarrhea and vomiting, lightheadedness then inject epinephrine and seek immediate medical care afterwards. Action plan in place.   Environmental allergies 2023 skin testing positive to dust mites, mold and cockroach. 2023 bloodwork positive to dust mites, grass, ragweed.  Borderline to cat, cockroach, tree, weed pollen. Continue environmental control measures as below. Continue Singulair (montelukast) '10mg'$  daily at night. Use over the counter antihistamines such as Zyrtec (cetirizine), Claritin (loratadine), Allegra (fexofenadine), or Xyzal  (levocetirizine) daily as needed. May take twice a day during allergy flares. May switch antihistamines every few months. Use Flonase (fluticasone) nasal spray 1 spray per nostril twice a day as needed for nasal congestion.  Use azelastine nasal spray 1-2 sprays per nostril twice a day as needed for runny nose/drainage. Nasal saline spray (i.e., Simply Saline) or nasal saline lavage (i.e., NeilMed) is recommended as needed and prior to medicated nasal sprays.  Infections Keep track of infections and antibiotics use.  Follow up in 3 months or sooner if needed. Get Covid-19 booster.   Reducing Pollen Exposure Pollen seasons: trees (spring), grass (summer) and ragweed/weeds (fall). Keep windows closed in your home and car to lower pollen exposure.  Install air conditioning in the bedroom and throughout the house if possible.  Avoid going out in dry windy days - especially early morning. Pollen counts are highest between 5 - 10 AM and on dry, hot and windy days.  Save outside activities for late afternoon or after a heavy rain, when pollen levels are lower.  Avoid mowing of grass if you have grass pollen allergy. Be aware that pollen can also be transported indoors on people and pets.  Dry your clothes in an automatic dryer rather than hanging them outside where they might collect pollen.  Rinse hair and eyes before bedtime. Mold Control Mold and fungi can grow on a variety of surfaces provided certain temperature and moisture conditions exist.  Outdoor molds grow on plants, decaying vegetation and soil. The major outdoor mold, Alternaria and Cladosporium, are found in very high numbers during hot and dry conditions. Generally, a late summer - fall peak is seen for common outdoor fungal spores. Rain will temporarily lower outdoor mold spore count, but counts rise rapidly when the  rainy period ends. The most important indoor molds are Aspergillus and Penicillium. Dark, humid and poorly ventilated  basements are ideal sites for mold growth. The next most common sites of mold growth are the bathroom and the kitchen. Outdoor (Seasonal) Mold Control Use air conditioning and keep windows closed. Avoid exposure to decaying vegetation. Avoid leaf raking. Avoid grain handling. Consider wearing a face mask if working in moldy areas.  Indoor (Perennial) Mold Control  Maintain humidity below 50%. Get rid of mold growth on hard surfaces with water, detergent and, if necessary, 5% bleach (do not mix with other cleaners). Then dry the area completely. If mold covers an area more than 10 square feet, consider hiring an indoor environmental professional. For clothing, washing with soap and water is best. If moldy items cannot be cleaned and dried, throw them away. Remove sources e.g. contaminated carpets. Repair and seal leaking roofs or pipes. Using dehumidifiers in damp basements may be helpful, but empty the water and clean units regularly to prevent mildew from forming. All rooms, especially basements, bathrooms and kitchens, require ventilation and cleaning to deter mold and mildew growth. Avoid carpeting on concrete or damp floors, and storing items in damp areas. Control of House Dust Mite Allergen Dust mite allergens are a common trigger of allergy and asthma symptoms. While they can be found throughout the house, these microscopic creatures thrive in warm, humid environments such as bedding, upholstered furniture and carpeting. Because so much time is spent in the bedroom, it is essential to reduce mite levels there.  Encase pillows, mattresses, and box springs in special allergen-proof fabric covers or airtight, zippered plastic covers.  Bedding should be washed weekly in hot water (130 F) and dried in a hot dryer. Allergen-proof covers are available for comforters and pillows that can't be regularly washed.  Wash the allergy-proof covers every few months. Minimize clutter in the bedroom. Keep  pets out of the bedroom.  Keep humidity less than 50% by using a dehumidifier or air conditioning. You can buy a humidity measuring device called a hygrometer to monitor this.  If possible, replace carpets with hardwood, linoleum, or washable area rugs. If that's not possible, vacuum frequently with a vacuum that has a HEPA filter. Remove all upholstered furniture and non-washable window drapes from the bedroom. Remove all non-washable stuffed toys from the bedroom.  Wash stuffed toys weekly. Pet Allergen Avoidance: Contrary to popular opinion, there are no "hypoallergenic" breeds of dogs or cats. That is because people are not allergic to an animal's hair, but to an allergen found in the animal's saliva, dander (dead skin flakes) or urine. Pet allergy symptoms typically occur within minutes. For some people, symptoms can build up and become most severe 8 to 12 hours after contact with the animal. People with severe allergies can experience reactions in public places if dander has been transported on the pet owners' clothing. Keeping an animal outdoors is only a partial solution, since homes with pets in the yard still have higher concentrations of animal allergens. Before getting a pet, ask your allergist to determine if you are allergic to animals. If your pet is already considered part of your family, try to minimize contact and keep the pet out of the bedroom and other rooms where you spend a great deal of time. As with dust mites, vacuum carpets often or replace carpet with a hardwood floor, tile or linoleum. High-efficiency particulate air (HEPA) cleaners can reduce allergen levels over time. While dander and saliva  are the source of cat and dog allergens, urine is the source of allergens from rabbits, hamsters, mice and [place] pigs; so ask a non-allergic family member to clean the animal's cage. If you have a pet allergy, talk to your allergist about the potential for allergy immunotherapy  (allergy shots). This strategy can often provide long-term relief. Cockroach Allergen Avoidance Cockroaches are often found in the homes of densely populated urban areas, schools or commercial buildings, but these creatures can lurk almost anywhere. This does not mean that you have a dirty house or living area. Block all areas where roaches can enter the home. This includes crevices, wall cracks and windows.  Cockroaches need water to survive, so fix and seal all leaky faucets and pipes. Have an exterminator go through the house when your family and pets are gone to eliminate any remaining roaches. Keep food in lidded containers and put pet food dishes away after your pets are done eating. Vacuum and sweep the floor after meals, and take out garbage and recyclables. Use lidded garbage containers in the kitchen. Wash dishes immediately after use and clean under stoves, refrigerators or toasters where crumbs can accumulate. Wipe off the stove and other kitchen surfaces and cupboards regularly.

## 2022-09-02 NOTE — Assessment & Plan Note (Signed)
Past history - Sinus infections once per year but this time it took 3 rounds of antibiotics and 2 courses of prednisone to clear. History of pneumonias. Recently diagnosed with diabetes.  2023 bloodwork normal immunoglobulin, positive titers against diptheria, tetanus and strep pneumoniae. Interim history - 1 course of antibiotics. Keep track of infections and antibiotics use.

## 2022-09-14 ENCOUNTER — Telehealth: Payer: Self-pay | Admitting: Nurse Practitioner

## 2022-09-14 ENCOUNTER — Other Ambulatory Visit: Payer: Self-pay

## 2022-09-14 DIAGNOSIS — I10 Essential (primary) hypertension: Secondary | ICD-10-CM

## 2022-09-14 MED ORDER — MELOXICAM 15 MG PO TABS: ORAL_TABLET | ORAL | 0 refills | Status: DC

## 2022-09-14 MED ORDER — VALSARTAN-HYDROCHLOROTHIAZIDE 80-12.5 MG PO TABS: 1.0000 | ORAL_TABLET | Freq: Every day | ORAL | 0 refills | Status: DC

## 2022-09-14 NOTE — Telephone Encounter (Signed)
Pt is requesting Meloxicam and Valsartan to go to [facility] on [address]

## 2022-09-15 MED ORDER — MELOXICAM 15 MG PO TABS: ORAL_TABLET | ORAL | 0 refills | Status: DC

## 2022-09-15 MED ORDER — VALSARTAN-HYDROCHLOROTHIAZIDE 80-12.5 MG PO TABS: 1.0000 | ORAL_TABLET | Freq: Every day | ORAL | 0 refills | Status: DC

## 2022-09-15 NOTE — Addendum Note (Signed)
Addended by: Chancy Hurter on: 09/15/2022 10:43 AM   Modules accepted: Orders

## 2022-09-17 DIAGNOSIS — G4733 Obstructive sleep apnea (adult) (pediatric): Secondary | ICD-10-CM | POA: Diagnosis not present

## 2022-09-24 NOTE — Progress Notes (Deleted)
   PATIENT: George Montgomery DOB: [birth year]  REASON FOR VISIT: follow up HISTORY FROM: patient  Virtual Visit via Telephone Note  I connected with George Montgomery on 09/24/22 at  8:45 AM EST by telephone and verified that I am speaking with the correct person using two identifiers.   I discussed the limitations, risks, security and privacy concerns of performing an evaluation and management service by telephone and the availability of in person appointments. I also discussed with the patient that there may be a patient responsible charge related to this service. The patient expressed understanding and agreed to proceed.   History of Present Illness:  09/24/22 ALL: Michall returns for follow up for OSA on BiPAP.   09/25/2021 ALL: George Montgomery is a 66 y.o. male here today for follow up for OSA on BiPAP. He continues to do well on therapy. He is using a new nasal pillow and reports leak has been much better at home. He is sleeping much better. Not snoring. He is feeling well and without concerns, today.      Observations/Objective:  Generalized: televisit  Mentation: Alert oriented to time, place, history taking. Follows all commands speech and language fluent   Assessment and Plan:  66 y.o. year old male  has a past medical history of Arthritis, Asthma, Back pain, Chronic pain of both shoulders, Depression, Dyspnea, Elevated glucose, Environmental allergies, GERD (gastroesophageal reflux disease), HLD (hyperlipidemia), HTN (hypertension), Insomnia, Obesity, OSA on CPAP (08/01/2014), Reflux, and Trigger finger. here with  No diagnosis found.   [name] continues to do well on BiPAP therapy. Compliance report reveals excellent compliance. He does have an elevated leak, however, feels that this has improved with new mask. AHI is well managed. He will continue to monitor at home. He was encouraged to continue BiPAP daily for at least 4 hours. He will continue healthy lifestyle habits.  Follow up in 1 year.   No orders of the defined types were placed in this encounter.    No orders of the defined types were placed in this encounter.     Follow Up Instructions:  I discussed the assessment and treatment plan with the patient. The patient was provided an opportunity to ask questions and all were answered. The patient agreed with the plan and demonstrated an understanding of the instructions.   The patient was advised to call back or seek an in-person evaluation if the symptoms worsen or if the condition fails to improve as anticipated.  I provided 11 minutes of non-face-to-face time during this encounter. Patient located at their place of residence during [place] visit. Provider is in the office.    Debbora Presto, NP

## 2022-09-24 NOTE — Patient Instructions (Incomplete)
Please continue using your BiPAP regularly. While your insurance requires that you use BiPAP at least 4 hours each night on 70% of the nights, I recommend, that you not skip any nights and use it throughout the night if you can. Getting used to BiPAP and staying with the treatment long term does take time and patience and discipline. Untreated obstructive sleep apnea when it is moderate to severe can have an adverse impact on cardiovascular health and raise her risk for heart disease, arrhythmias, hypertension, congestive heart failure, stroke and diabetes. Untreated obstructive sleep apnea causes sleep disruption, nonrestorative sleep, and sleep deprivation. This can have an impact on your day to day functioning and cause daytime sleepiness and impairment of cognitive function, memory loss, mood disturbance, and problems focussing. Using BiPAP regularly can improve these symptoms.   

## 2022-09-25 DIAGNOSIS — R52 Pain, unspecified: Secondary | ICD-10-CM | POA: Diagnosis not present

## 2022-09-25 DIAGNOSIS — M79641 Pain in right hand: Secondary | ICD-10-CM | POA: Diagnosis not present

## 2022-09-25 DIAGNOSIS — M71349 Other bursal cyst, unspecified hand: Secondary | ICD-10-CM | POA: Diagnosis not present

## 2022-09-25 DIAGNOSIS — M79642 Pain in left hand: Secondary | ICD-10-CM | POA: Diagnosis not present

## 2022-09-28 ENCOUNTER — Telehealth: Payer: Self-pay

## 2022-09-28 ENCOUNTER — Other Ambulatory Visit: Payer: Self-pay | Admitting: Allergy

## 2022-09-28 ENCOUNTER — Encounter: Payer: Self-pay | Admitting: Allergy

## 2022-09-28 ENCOUNTER — Telehealth: Payer: Self-pay | Admitting: Internal Medicine

## 2022-09-28 ENCOUNTER — Ambulatory Visit: Payer: Medicare HMO | Admitting: Allergy

## 2022-09-28 DIAGNOSIS — J302 Other seasonal allergic rhinitis: Secondary | ICD-10-CM

## 2022-09-28 DIAGNOSIS — J4541 Moderate persistent asthma with (acute) exacerbation: Secondary | ICD-10-CM | POA: Insufficient documentation

## 2022-09-28 DIAGNOSIS — J3089 Other allergic rhinitis: Secondary | ICD-10-CM | POA: Diagnosis not present

## 2022-09-28 DIAGNOSIS — J4540 Moderate persistent asthma, uncomplicated: Secondary | ICD-10-CM

## 2022-09-28 DIAGNOSIS — T7800XD Anaphylactic reaction due to unspecified food, subsequent encounter: Secondary | ICD-10-CM | POA: Diagnosis not present

## 2022-09-28 DIAGNOSIS — J988 Other specified respiratory disorders: Secondary | ICD-10-CM | POA: Diagnosis not present

## 2022-09-28 DIAGNOSIS — B999 Unspecified infectious disease: Secondary | ICD-10-CM | POA: Insufficient documentation

## 2022-09-28 DIAGNOSIS — Z91038 Other insect allergy status: Secondary | ICD-10-CM

## 2022-09-28 MED ORDER — DOXYCYCLINE MONOHYDRATE 100 MG PO TABS: 100.0000 mg | ORAL_TABLET | Freq: Two times a day (BID) | ORAL | 0 refills | Status: DC

## 2022-09-28 MED ORDER — METHYLPREDNISOLONE ACETATE 40 MG/ML IJ SUSP
40.0000 mg | Freq: Once | INTRAMUSCULAR | Status: AC
  Administered 2022-09-28: 40 mg via INTRAMUSCULAR

## 2022-09-28 MED ORDER — FLUTICASONE PROPIONATE 50 MCG/ACT NA SUSP: 1.0000 | Freq: Two times a day (BID) | NASAL | 5 refills | Status: AC | PRN

## 2022-09-28 MED ORDER — AIRSUPRA 90-80 MCG/ACT IN AERO: 2.0000 | INHALATION_SPRAY | RESPIRATORY_TRACT | 2 refills | Status: DC | PRN

## 2022-09-28 NOTE — Assessment & Plan Note (Signed)
Past history - Went to ER few times after stings due to hives and swelling. Never required Epi. 2023 bloodwork positive to honeybee, white faced hornet, yellow jacket, wasp, yellow hornet and bumblebee. Interim history - Wants to start VIT in January 2024. Continue to avoid. Recommend starting injections for this first - 3 shots Check with insurance company and make first shot appointment for next year.  For mild symptoms you can take over the counter antihistamines such as Benadryl and monitor symptoms closely. If symptoms worsen or if you have severe symptoms including breathing issues, throat closure, significant swelling, whole body hives, severe diarrhea and vomiting, lightheadedness then inject epinephrine and seek immediate medical care afterwards. Action plan in place.

## 2022-09-28 NOTE — Assessment & Plan Note (Signed)
Coughing and wheezing. Depo '40mg'$  given today - patient already on steroid taper for joint/hand pain.  May use Airsupra rescue inhaler 2 puffs every 4 to 6 hours as needed for shortness of breath, chest tightness, coughing, and wheezing. Do not use more than 12 puffs in 24 hours. May use Airsupra rescue inhaler 2 puffs 5 to 15 minutes prior to strenuous physical activities. Monitor frequency of use. Rinse mouth after each use.  If not covered then let me know.  Coupon given. Daily controller medication(s): continue Breo 278mg 1 puff once a day and rinse mouth after each use.

## 2022-09-28 NOTE — Telephone Encounter (Signed)
Please call patient.  He needs to evaluated in person so we can listen to his lungs and check vitals.   [facility] both have openings today for same day visits.  Thank you.

## 2022-09-28 NOTE — Telephone Encounter (Signed)
FOR DOCUMENTATION ONLY: pt called wanting abx and cough syrup sent in for him for congestion and coughing up dark phelgm. I told he we'd need to have him come in for an appt to be treated, he said "I will not, I am in bed". I told him that we do not prescribe medication without an OV his response was that we at one point did. My response "Dr. Melford Aase has changed the policy in the office to only prescribing medication with an OV, the only exception to that is if the pt is Covid positive in which a phone visit will be done to prescribe medications. giving out medicine without an OV is not good medical practice". He hung up the phone after that.

## 2022-09-28 NOTE — Patient Instructions (Addendum)
Respiratory infection See below for symptomatic treatment. Take doxycycline '100mg'$  twice a day for 10 days. If you start having fevers - recommend to check for flu and covid.  Breathing Steroid injection given.  May use Airsupra rescue inhaler 2 puffs every 4 to 6 hours as needed for shortness of breath, chest tightness, coughing, and wheezing. Do not use more than 12 puffs in 24 hours. May use Airsupra rescue inhaler 2 puffs 5 to 15 minutes prior to strenuous physical activities. Monitor frequency of use. Rinse mouth after each use.  If not covered then let me know.  Coupon given. Daily controller medication(s): continue Breo 229mg 1 puff once a day and rinse mouth after each use.  Breathing control goals:  Full participation in all desired activities (may need albuterol before activity) Albuterol use two times or less a week on average (not counting use with activity) Cough interfering with sleep two times or less a month Oral steroids no more than once a year No hospitalizations  Food 2023 testing: positive to bBolivianuts, borderline to walnuts, pecan and pistachio.  More likely to have anaphylactic reaction to BBolivianuts, walnuts. Continue strict avoidance of tree nuts. Okay with eating peanuts.   Bee stings 2023 bloodwork positive to honeybee, white faced hornet, yellow jacket, wasp, yellow hornet and bumblebee. Continue to avoid. Recommend starting injections for this first - 3 shots Check with insurance company and make first shot appointment for next year.  For mild symptoms you can take over the counter antihistamines such as Benadryl and monitor symptoms closely. If symptoms worsen or if you have severe symptoms including breathing issues, throat closure, significant swelling, whole body hives, severe diarrhea and vomiting, lightheadedness then inject epinephrine and seek immediate medical care afterwards. Action plan in place.   Environmental allergies 2023 skin testing  positive to dust mites, mold and cockroach. 2023 bloodwork positive to dust mites, grass, ragweed.  Borderline to cat, cockroach, tree, weed pollen. Continue environmental control measures as below. Continue Singulair (montelukast) '10mg'$  daily at night. Use over the counter antihistamines such as Zyrtec (cetirizine), Claritin (loratadine), Allegra (fexofenadine), or Xyzal (levocetirizine) daily as needed. May take twice a day during allergy flares. May switch antihistamines every few months. Use Flonase (fluticasone) nasal spray 1 spray per nostril twice a day as needed for nasal congestion.  Use azelastine nasal spray 1-2 sprays per nostril twice a day as needed for runny nose/drainage. Nasal saline spray (i.e., Simply Saline) or nasal saline lavage (i.e., NeilMed) is recommended as needed and prior to medicated nasal sprays.  Infections Keep track of infections and antibiotics use.  Follow up in 3 months or sooner if needed.  Drink plenty of fluids. Water, juice, clear broth or warm lemon water are good choices. Avoid caffeine and alcohol, which can dehydrate you. Eat chicken soup. Chicken soup and other warm fluids can be soothing and loosen congestion. Rest. Adjust your room's temperature and humidity. Keep your room warm but not overheated. If the air is dry, a cool-mist humidifier or vaporizer can moisten the air and help ease congestion and coughing. Keep the humidifier clean to prevent the growth of bacteria and molds. Soothe your throat. Perform a saltwater gargle. Dissolve one-quarter to a half teaspoon of salt in a 4- to 8-ounce glass of warm water. This can relieve a sore or scratchy throat temporarily. Use saline nasal drops. To help relieve nasal congestion, try saline nasal drops. You can buy these drops over the counter, and they can help  relieve symptoms ? even in children. Take over-the-counter cold and cough medications. For adults and children older than 5,  over-the-counter decongestants, antihistamines and pain relievers might offer some symptom relief. However, they won't prevent a cold or shorten its duration.

## 2022-09-28 NOTE — Telephone Encounter (Signed)
Please call patient to see if he was able to get Airsupra with the Co-pay card.  If not then return to using albuterol as the rescue inhaler.

## 2022-09-28 NOTE — Assessment & Plan Note (Signed)
Past history - Upper body itching after eating mixed nuts in December 2023. Has eaten peanut butter since then with no issues. 2023 skin testing showed: Positive to [place] nuts. 2023 bloodwork positive to brazil nuts, borderline to walnuts, pecan and pistachio.  More likely to have anaphylactic reaction to [place] nuts, walnuts. Continue strict avoidance of tree nuts. For mild symptoms you can take over the counter antihistamines such as Benadryl and monitor symptoms closely. If symptoms worsen or if you have severe symptoms including breathing issues, throat closure, significant swelling, whole body hives, severe diarrhea and vomiting, lightheadedness then inject epinephrine and seek immediate medical care afterwards. 

## 2022-09-28 NOTE — Assessment & Plan Note (Signed)
Worsening symptoms for the past 3-4 days. Denies fevers. Negative Covid-19 testing at home. See below for symptomatic treatment. Take doxycycline '100mg'$  twice a day for 10 days. If you start having fevers - recommend to check for flu and covid.

## 2022-09-28 NOTE — Assessment & Plan Note (Addendum)
Keep track of infections and antibiotics use. 

## 2022-09-28 NOTE — Telephone Encounter (Signed)
Patient called stating he his coughing up dark phlegm and it's making it hard for him to breath, has been happening for about three days. He wants to know if he can have an antibiotic and a good cough medicine

## 2022-09-28 NOTE — Progress Notes (Signed)
Follow Up Note  RE: George Montgomery MRN: [id number] DOB: Nov 22, 1955 Date of Office Visit: 09/28/2022  Referring provider: Unk Pinto, MD Primary care provider: Unk Pinto, MD  Chief Complaint: Cough (Coughing for about 2-3 days, coughing up black phlegm )  History of Present Illness: I had the pleasure of seeing George Montgomery for a follow up visit at the [facility] on 09/28/2022. He is a 66 y.o. male, who is being followed for asthma, hymenoptera allergy, allergic rhinitis, food allergy and recurrent infections. His previous allergy office visit was on 09/02/2022 with Dr. Maudie Mercury. Today is a new complaint visit of not feeling well .  Respiratory infection Patient started to have rhinorrhea, deep cough with dark green/brown mucous, wheezing for the past 3 days. No fevers/chills. Symptoms are not improving.   Negative covid-19 testing at home today.  Denies sick contacts.   Taking some OTC zyrtec, nasal sprays. He typically does not like to take OTC meds as he had some reactions to them in the past.    Currently taking medrol pak for his hand/joint pains with good benefit.   No additional antibiotics since last OV.   Hymenoptera allergy Wants to start next year on shots.    Moderate persistent asthma Taking Breo 252mg 1 puff once a day and been using albuterol with unknown benefit with current respiratory infection.   Assessment and Plan: TSheriis a 66y.o. male with: Respiratory infection Worsening symptoms for the past 3-4 days. Denies fevers. Negative Covid-19 testing at home. See below for symptomatic treatment. Take doxycycline '100mg'$  twice a day for 10 days. If you start having fevers - recommend to check for flu and covid.  Recurrent infections Keep track of infections and antibiotics use.  Moderate persistent asthma with (acute) exacerbation Coughing and wheezing. Depo '40mg'$  given today - patient already on steroid taper for joint/hand  pain.  May use Airsupra rescue inhaler 2 puffs every 4 to 6 hours as needed for shortness of breath, chest tightness, coughing, and wheezing. Do not use more than 12 puffs in 24 hours. May use Airsupra rescue inhaler 2 puffs 5 to 15 minutes prior to strenuous physical activities. Monitor frequency of use. Rinse mouth after each use.  If not covered then let me know.  Coupon given. Daily controller medication(s): continue Breo 2042m 1 puff once a day and rinse mouth after each use.   Hymenoptera allergy Past history - Went to ER few times after stings due to hives and swelling. Never required Epi. 2023 bloodwork positive to honeybee, white faced hornet, yellow jacket, wasp, yellow hornet and bumblebee. Interim history - Wants to start VIT in January 2024. Continue to avoid. Recommend starting injections for this first - 3 shots Check with insurance company and make first shot appointment for next year.  For mild symptoms you can take over the counter antihistamines such as Benadryl and monitor symptoms closely. If symptoms worsen or if you have severe symptoms including breathing issues, throat closure, significant swelling, whole body hives, severe diarrhea and vomiting, lightheadedness then inject epinephrine and seek immediate medical care afterwards. Action plan in place.  Seasonal and perennial allergic rhinitis Past history - Perennial rhinoconjunctivitis symptoms for many years mainly in the spring and fall.  Tried Singulair, Zyrtec and Flonase with some benefit.  No prior ENT evaluation.  2023 CT sinus showed mild mucosal thickening. 2023 skin testing showed: Positive to dust mites, mold and cockroach. 2023 bloodwork positive to dust mites,  grass, ragweed.  Borderline to cat, cockroach, tree, weed pollen. Continue environmental control measures as below. Continue Singulair (montelukast) '10mg'$  daily at night. Use over the counter antihistamines such as Zyrtec (cetirizine), Claritin  (loratadine), Allegra (fexofenadine), or Xyzal (levocetirizine) daily as needed. May take twice a day during allergy flares. May switch antihistamines every few months. Use Flonase (fluticasone) nasal spray 1 spray per nostril twice a day as needed for nasal congestion.  Use azelastine nasal spray 1-2 sprays per nostril twice a day as needed for runny nose/drainage. Nasal saline spray (i.e., Simply Saline) or nasal saline lavage (i.e., NeilMed) is recommended as needed and prior to medicated nasal sprays. If worsening symptoms - will refer to ENT next. Consider adding on environmental injections after finished build up with the venoms.  Anaphylactic reaction due to food, subsequent encounter Past history - Upper body itching after eating mixed nuts in December 2023. Has eaten peanut butter since then with no issues. 2023 skin testing showed: Positive to [place] nuts. 2023 bloodwork positive to [place] nuts, borderline to walnuts, pecan and pistachio.  More likely to have anaphylactic reaction to [place] nuts, walnuts. Continue strict avoidance of tree nuts. For mild symptoms you can take over the counter antihistamines such as Benadryl and monitor symptoms closely. If symptoms worsen or if you have severe symptoms including breathing issues, throat closure, significant swelling, whole body hives, severe diarrhea and vomiting, lightheadedness then inject epinephrine and seek immediate medical care afterwards.  Return in about 2 months (around 11/29/2022).  Meds ordered this encounter  Medications   fluticasone (FLONASE) 50 MCG/ACT nasal spray    Sig: Place 1 spray into both nostrils 2 (two) times daily as needed (nasal congestion).    Dispense:  16 g    Refill:  5   doxycycline (ADOXA) 100 MG tablet    Sig: Take 1 tablet (100 mg total) by mouth 2 (two) times daily.    Dispense:  20 tablet    Refill:  0   Albuterol-Budesonide (AIRSUPRA) 90-80 MCG/ACT AERO    Sig: Inhale 2 puffs into the lungs  every 4 (four) hours as needed (coughing, wheezing, chest tightness). Do not exceed 12 puffs in 24 hours.    Dispense:  10.7 g    Refill:  2    Patient will take co-pay card.   methylPREDNISolone acetate (DEPO-MEDROL) injection 40 mg   Lab Orders  No laboratory test(s) ordered today    Diagnostics: None.   Medication List:  Current Outpatient Medications  Medication Sig Dispense Refill   albuterol (VENTOLIN HFA) 108 (90 Base) MCG/ACT inhaler USE 2 INHALATIONS 15-20 MINUTES APART EVERY 4 HOURS AS NEEDED TO RESCUE ASTHMA 48 g 3   Albuterol-Budesonide (AIRSUPRA) 90-80 MCG/ACT AERO Inhale 2 puffs into the lungs every 4 (four) hours as needed (coughing, wheezing, chest tightness). Do not exceed 12 puffs in 24 hours. 10.7 g 2   azelastine (ASTELIN) 0.1 % nasal spray Place 1-2 sprays into both nostrils 2 (two) times daily as needed (nasal drainage). Use in each nostril as directed 30 mL 5   bisoprolol (ZEBETA) 5 MG tablet Take 1 tablet (5 mg total) by mouth daily. 90 tablet 3   cetirizine (ZYRTEC ALLERGY) 10 MG tablet Take 1 tablet (10 mg total) by mouth daily. 30 tablet 0   Cholecalciferol (VITAMIN D) 125 MCG (5000 UT) CAPS Take by mouth.     ciprofloxacin (CILOXAN) 0.3 % ophthalmic solution Place 1 drop into both eyes daily as needed.  doxycycline (ADOXA) 100 MG tablet Take 1 tablet (100 mg total) by mouth 2 (two) times daily. 20 tablet 0   Dulaglutide (TRULICITY) 4.5 JM/4.2AS SOPN Inject 4.5 mg into Skin every 7 days for Diabetes              (Dx: e11.29) 6 mL 3   EPINEPHrine (EPIPEN 2-PAK) 0.3 mg/0.3 mL IJ SOAJ injection Inject 0.3 mg into the muscle as needed for anaphylaxis. 2 each 2   escitalopram (LEXAPRO) 10 MG tablet Take 1 tablet Daily for Mood 90 tablet 3   fenofibrate micronized (LOFIBRA) 134 MG capsule TAKE 1 CAPSULE BY MOUTH ONCE DAILY FOR  TRIGLYCERIDES  (BLOOD  FATS) 90 capsule 0   fluocinonide (LIDEX) 0.05 % external solution Apply 1 Application topically daily as needed.      fluticasone (FLONASE) 50 MCG/ACT nasal spray Place 1 spray into both nostrils 2 (two) times daily as needed (nasal congestion). 16 g 5   fluticasone furoate-vilanterol (BREO ELLIPTA) 200-25 MCG/ACT AEPB Inhale 1 puff into the lungs daily. Rinse mouth after each use. 60 each 3   HYDROcodone-acetaminophen (NORCO) 5-325 MG tablet Take 1/2 to 1 tablet every 3 to 4 hours as needed for Severe Pain 10 tablet 0   ketoconazole (NIZORAL) 2 % cream Apply 1 Application topically daily as needed.     levofloxacin (LEVAQUIN) 500 MG tablet Take 500 mg by mouth daily.     meloxicam (MOBIC) 15 MG tablet TAKE 1/2 TO 1 (ONE-HALF TO ONE) TABLET BY MOUTH ONCE DAILY WITH FOOD FOR PAIN AND FOR INFLAMMATION 90 tablet 0   methylPREDNISolone (MEDROL) 4 MG tablet Take 4 mg by mouth daily.     montelukast (SINGULAIR) 10 MG tablet Take  1 tablet  Daily for Allergies                                       /                                                  TAKE                                    BY                             MOUTH                                            ONCE DAILY 90 tablet 3   montelukast (SINGULAIR) 4 MG chewable tablet Chew 4 mg by mouth at bedtime.     omeprazole (PRILOSEC) 20 MG capsule Take 1 capsule (20 mg total) by mouth daily. 90 capsule 0   phentermine (ADIPEX-P) 37.5 MG tablet TAKE 1 TABLET EVERY MORNING FOR DIETING & WEIGHT LOSS 30 tablet 0   sildenafil (VIAGRA) 100 MG tablet TAKE ONE TABLET BY MOUTH ONE TIME DAILY AS NEEDED 30 tablet 0   SODIUM FLUORIDE 5000 PPM 1.1 % GEL dental gel Place 1 Application onto teeth at bedtime.  tamsulosin (FLOMAX) 0.4 MG CAPS capsule TAKE 1 CAPSULE BY MOUTH AT BEDTIME FOR PROSTATE 90 capsule 0   triamcinolone cream (KENALOG) 0.1 % Apply 1 Application topically 2 (two) times daily.     valsartan-hydrochlorothiazide (DIOVAN-HCT) 80-12.5 MG tablet Take 1 tablet by mouth daily. for blood pressure 90 tablet 0   No current facility-administered medications for this  visit.   Allergies: Allergies  Allergen Reactions   Bee Venom Anaphylaxis   Pine Anaphylaxis   [place] Nut (Berthollefia [place]) Skin Test     pruritus   Lisinopril Other (See Comments)    Patient intolerant of drug which caused his tongue to tingle.   Penicillins Itching   Celebrex [Celecoxib]    I reviewed his past medical history, social history, family history, and environmental history and no significant changes have been reported from his previous visit.  Review of Systems  Constitutional:  Negative for appetite change, chills, fever and unexpected weight change.  HENT:  Positive for congestion and rhinorrhea.   Eyes:  Negative for itching.  Respiratory:  Positive for cough and wheezing. Negative for chest tightness and shortness of breath.   Cardiovascular:  Negative for chest pain.  Gastrointestinal:  Negative for abdominal pain.  Genitourinary:  Negative for difficulty urinating.  Skin:  Negative for rash.  Allergic/Immunologic: Positive for environmental allergies and food allergies.    Objective: There were no vitals taken for this visit. There is no height or weight on file to calculate BMI. Physical Exam Vitals and nursing note reviewed.  Constitutional:      Appearance: Normal appearance. He is well-developed. He is obese.  HENT:     Head: Normocephalic and atraumatic.     Right Ear: Tympanic membrane and external ear normal.     Left Ear: Tympanic membrane and external ear normal.     Nose: Nose normal.     Mouth/Throat:     Mouth: Mucous membranes are moist.     Pharynx: Oropharynx is clear.  Eyes:     Conjunctiva/sclera: Conjunctivae normal.  Cardiovascular:     Rate and Rhythm: Normal rate and regular rhythm.     Heart sounds: Normal heart sounds. No murmur heard.    No friction rub. No gallop.  Pulmonary:     Effort: Pulmonary effort is normal.     Breath sounds: No wheezing, rhonchi or rales.     Comments: Decreased breath sounds  throughout. Musculoskeletal:     Cervical back: Neck supple.  Skin:    General: Skin is warm.     Findings: No rash.  Neurological:     Mental Status: He is alert and oriented to person, place, and time.  Psychiatric:        Behavior: Behavior normal.    Previous notes and tests were reviewed. The plan was reviewed with the patient/family, and all questions/concerned were addressed.  It was my pleasure to see George Montgomery today and participate in his care. Please feel free to contact me with any questions or concerns.  Sincerely,  Rexene Alberts, DO Allergy & Immunology  [facility] office: [place] office: [phone]

## 2022-09-28 NOTE — Assessment & Plan Note (Signed)
Past history - Perennial rhinoconjunctivitis symptoms for many years mainly in the spring and fall.  Tried Singulair, Zyrtec and Flonase with some benefit.  No prior ENT evaluation.  2023 CT sinus showed mild mucosal thickening. 2023 skin testing showed: Positive to dust mites, mold and cockroach. 2023 bloodwork positive to dust mites, grass, ragweed.  Borderline to cat, cockroach, tree, weed pollen. Continue environmental control measures as below. Continue Singulair (montelukast) '10mg'$  daily at night. Use over the counter antihistamines such as Zyrtec (cetirizine), Claritin (loratadine), Allegra (fexofenadine), or Xyzal (levocetirizine) daily as needed. May take twice a day during allergy flares. May switch antihistamines every few months. Use Flonase (fluticasone) nasal spray 1 spray per nostril twice a day as needed for nasal congestion.  Use azelastine nasal spray 1-2 sprays per nostril twice a day as needed for runny nose/drainage. Nasal saline spray (i.e., Simply Saline) or nasal saline lavage (i.e., NeilMed) is recommended as needed and prior to medicated nasal sprays. If worsening symptoms - will refer to ENT next. Consider adding on environmental injections after finished build up with the venoms.

## 2022-09-28 NOTE — Telephone Encounter (Signed)
Still has the chest cold and mucous. Patient stated that he did not want leave the house because he doesn't feel good. Gave him the option of coming into the office at 10 or 10:30 to be seen because we need to evaluate him in person before we prescribe any medication.

## 2022-09-29 ENCOUNTER — Telehealth: Payer: Medicare HMO | Admitting: Family Medicine

## 2022-09-29 DIAGNOSIS — G4733 Obstructive sleep apnea (adult) (pediatric): Secondary | ICD-10-CM

## 2022-09-29 NOTE — Telephone Encounter (Signed)
Lm for pt to call us back about this °

## 2022-09-30 ENCOUNTER — Telehealth: Payer: Self-pay | Admitting: Allergy

## 2022-09-30 NOTE — Telephone Encounter (Signed)
Patient states is returning call to go over medications currently taking,

## 2022-10-01 NOTE — Telephone Encounter (Signed)
Called patient - DPR verified - LMOVM to confirm whether or not patient was able to get Airsupra w/copay card. If not, patient will need to go back on Albuterol  as rescue inhaler.

## 2022-10-14 ENCOUNTER — Telehealth: Payer: Self-pay | Admitting: Allergy

## 2022-10-14 ENCOUNTER — Encounter: Payer: Self-pay | Admitting: Allergy

## 2022-10-14 ENCOUNTER — Other Ambulatory Visit: Payer: Self-pay

## 2022-10-14 ENCOUNTER — Ambulatory Visit (INDEPENDENT_AMBULATORY_CARE_PROVIDER_SITE_OTHER): Payer: Medicare HMO | Admitting: Allergy

## 2022-10-14 ENCOUNTER — Ambulatory Visit: Payer: Medicare HMO | Admitting: Internal Medicine

## 2022-10-14 DIAGNOSIS — J988 Other specified respiratory disorders: Secondary | ICD-10-CM | POA: Diagnosis not present

## 2022-10-14 DIAGNOSIS — T7800XD Anaphylactic reaction due to unspecified food, subsequent encounter: Secondary | ICD-10-CM

## 2022-10-14 DIAGNOSIS — Z880 Allergy status to penicillin: Secondary | ICD-10-CM | POA: Diagnosis not present

## 2022-10-14 DIAGNOSIS — B999 Unspecified infectious disease: Secondary | ICD-10-CM | POA: Diagnosis not present

## 2022-10-14 DIAGNOSIS — Z91038 Other insect allergy status: Secondary | ICD-10-CM | POA: Diagnosis not present

## 2022-10-14 DIAGNOSIS — J4540 Moderate persistent asthma, uncomplicated: Secondary | ICD-10-CM | POA: Diagnosis not present

## 2022-10-14 DIAGNOSIS — J302 Other seasonal allergic rhinitis: Secondary | ICD-10-CM

## 2022-10-14 DIAGNOSIS — J3089 Other allergic rhinitis: Secondary | ICD-10-CM

## 2022-10-14 MED ORDER — BENZONATATE 100 MG PO CAPS: 100.0000 mg | ORAL_CAPSULE | Freq: Three times a day (TID) | ORAL | 0 refills | Status: DC | PRN

## 2022-10-14 MED ORDER — AZITHROMYCIN 250 MG PO TABS: ORAL_TABLET | ORAL | 0 refills | Status: DC

## 2022-10-14 NOTE — Telephone Encounter (Signed)
Pt is waiting on televisit with dr kim right now

## 2022-10-14 NOTE — Assessment & Plan Note (Signed)
Past history - Perennial rhinoconjunctivitis symptoms for many years mainly in the spring and fall.  Tried Singulair, Zyrtec and Flonase with some benefit.  No prior ENT evaluation.  2023 CT sinus showed mild mucosal thickening. 2023 skin testing showed: Positive to dust mites, mold and cockroach. 2023 bloodwork positive to dust mites, grass, ragweed.  Borderline to cat, cockroach, tree, weed pollen. Continue environmental control measures as below. Continue Singulair (montelukast) '10mg'$  daily at night. Use over the counter antihistamines such as Zyrtec (cetirizine), Claritin (loratadine), Allegra (fexofenadine), or Xyzal (levocetirizine) daily as needed. May take twice a day during allergy flares. May switch antihistamines every few months. Use Flonase (fluticasone) nasal spray 1 spray per nostril twice a day as needed for nasal congestion.  Use azelastine nasal spray 1-2 sprays per nostril twice a day as needed for runny nose/drainage. Nasal saline spray (i.e., Simply Saline) or nasal saline lavage (i.e., NeilMed) is recommended as needed and prior to medicated nasal sprays. If worsening symptoms - will refer to ENT next. Consider adding on environmental injections after finished build up with the venoms.

## 2022-10-14 NOTE — Assessment & Plan Note (Signed)
Past history - Upper body itching after eating mixed nuts in December 2023. Has eaten peanut butter since then with no issues. 2023 skin testing showed: Positive to [place] nuts. 2023 bloodwork positive to brazil nuts, borderline to walnuts, pecan and pistachio.  More likely to have anaphylactic reaction to [place] nuts, walnuts. Continue strict avoidance of tree nuts. For mild symptoms you can take over the counter antihistamines such as Benadryl and monitor symptoms closely. If symptoms worsen or if you have severe symptoms including breathing issues, throat closure, significant swelling, whole body hives, severe diarrhea and vomiting, lightheadedness then inject epinephrine and seek immediate medical care afterwards. 

## 2022-10-14 NOTE — Assessment & Plan Note (Signed)
Past history - Went to ER few times after stings due to hives and swelling. Never required Epi. 2023 bloodwork positive to honeybee, white faced hornet, yellow jacket, wasp, yellow hornet and bumblebee. Continue to avoid. Recommend starting injections for this first - 3 shots Check with insurance company and make first shot appointment for next year.  For mild symptoms you can take over the counter antihistamines such as Benadryl and monitor symptoms closely. If symptoms worsen or if you have severe symptoms including breathing issues, throat closure, significant swelling, whole body hives, severe diarrhea and vomiting, lightheadedness then inject epinephrine and seek immediate medical care afterwards. Action plan in place.

## 2022-10-14 NOTE — Assessment & Plan Note (Signed)
Finished doxycycline which helped his chest mucous/congestion. Now having sinusitis symptoms.  See below for symptomatic treatment. Take zpak as prescribed.  If you start having fevers - recommend to check for flu and covid. May take tessalon perles '100mg'$  three times a day as needed for coughing.  Use nettitpot twice a day.  If not better, will need to be evaluated in person next.

## 2022-10-14 NOTE — Assessment & Plan Note (Signed)
Keep track of infections and antibiotics use. 

## 2022-10-14 NOTE — Assessment & Plan Note (Signed)
Past history - Gets wheezing, coughing and shortness of breath mainly during URIs. Uses albuterol rarely but during the last URI needed to add on Trelegy for 2 weeks. 2023 CXR unremarkable. 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 Clinically feeling slightly improved.  Interim history - doing better.  Daily controller medication(s): continue Breo 234mg 1 puff once a day and rinse mouth after each use.  May use Airsupra rescue inhaler 2 puffs every 4 to 6 hours as needed for shortness of breath, chest tightness, coughing, and wheezing. Do not use more than 12 puffs in 24 hours. May use Airsupra rescue inhaler 2 puffs 5 to 15 minutes prior to strenuous physical activities. Monitor frequency of use. Rinse mouth after each use.

## 2022-10-14 NOTE — Assessment & Plan Note (Signed)
Consider penicillin allergy skin testing and in office drug challenge in the future.  Over 90% of people with history of penicillin allergy which occurred over 10 years ago are found to be non-allergic.  

## 2022-10-14 NOTE — Progress Notes (Signed)
RE: George Montgomery MRN: [id number] DOB: [birth year] Date of Telemedicine Visit: 10/14/2022  Referring provider: Unk Pinto, MD Primary care provider: Unk Pinto, MD  Chief Complaint: Other (Televisit: Happens every year and it takes 3 rounds of antibiotics to clear it it. Patient would like cough medication that isn't over the counter/ cough syrup, Tessalon pearls, z pack or antibiotic.//Has cough with drainage. No temperature. )   Telemedicine Follow Up Visit via Telephone: I connected with George Montgomery for a follow up on 10/14/22 by telephone and verified that I am speaking with the correct person using two identifiers.   I discussed the limitations, risks, security and privacy concerns of performing an evaluation and management service by telephone and the availability of in person appointments. I also discussed with the patient that there may be a patient responsible charge related to this service. The patient expressed understanding and agreed to proceed.  Patient is at home/work.  Provider is at the office.  Visit start time: 4:48PM Visit end time: 4:59PM Insurance consent/check in by: front desk Medical consent and medical assistant/nurse: Trayce J.  History of Present Illness: He is a 66 y.o. male, who is being followed for asthma, hymenoptera allergy, allergic rhinitis, food allergy and recurrent infections . His previous allergy office visit was on 09/28/2022 with Dr. Maudie Montgomery. Today is a new complaint visit of not feeling better .  Respiratory infection Patient is having PND, coughing, rhinorrhea.  No fevers.  Finished doxycycline which helped the mucous in his chest and now it's in his head.  Negative covid-19 testing at home.  Denies sick contacts.    Moderate persistent asthma Breathing is doing better and was able to walk a mile yesterday.    Seasonal and perennial allergic rhinitis Currently using Flonase 2 sprays per nostril twice a day, azelastine 2  sprays per nostril once a day. Taking zyrtec '10mg'$  BID.   Assessment and Plan: [name] is a 66 y.o. male with: Respiratory infection Finished doxycycline which helped his chest mucous/congestion. Now having sinusitis symptoms.  See below for symptomatic treatment. Take zpak as prescribed.  If you start having fevers - recommend to check for flu and covid. May take tessalon perles '100mg'$  three times a day as needed for coughing.  Use nettitpot twice a day.  If not better, will need to be evaluated in person next.   Recurrent infections Keep track of infections and antibiotics use.  Penicillin allergy Consider penicillin allergy skin testing and in office drug challenge in the future.  Over 90% of people with history of penicillin allergy which occurred over 10 years ago are found to be non-allergic.   Moderate persistent asthma without complication Past history - Gets wheezing, coughing and shortness of breath mainly during URIs. Uses albuterol rarely but during the last URI needed to add on Trelegy for 2 weeks. 2023 CXR unremarkable. 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 Clinically feeling slightly improved.  Interim history - doing better.  Daily controller medication(s): continue Breo 260mg 1 puff once a day and rinse mouth after each use.  May use Airsupra rescue inhaler 2 puffs every 4 to 6 hours as needed for shortness of breath, chest tightness, coughing, and wheezing. Do not use more than 12 puffs in 24 hours. May use Airsupra rescue inhaler 2 puffs 5 to 15 minutes prior to strenuous physical activities. Monitor frequency of use. Rinse mouth after each use.  Seasonal and perennial allergic rhinitis Past history - Perennial rhinoconjunctivitis symptoms for many years mainly in the spring and fall.  Tried Singulair, Zyrtec and Flonase with some benefit.  No prior ENT evaluation.  2023  CT sinus showed mild mucosal thickening. 2023 skin testing showed: Positive to dust mites, mold and cockroach. 2023 bloodwork positive to dust mites, grass, ragweed.  Borderline to cat, cockroach, tree, weed pollen. Continue environmental control measures as below. Continue Singulair (montelukast) '10mg'$  daily at night. Use over the counter antihistamines such as Zyrtec (cetirizine), Claritin (loratadine), Allegra (fexofenadine), or Xyzal (levocetirizine) daily as needed. May take twice a day during allergy flares. May switch antihistamines every few months. Use Flonase (fluticasone) nasal spray 1 spray per nostril twice a day as needed for nasal congestion.  Use azelastine nasal spray 1-2 sprays per nostril twice a day as needed for runny nose/drainage. Nasal saline spray (i.e., Simply Saline) or nasal saline lavage (i.e., NeilMed) is recommended as needed and prior to medicated nasal sprays. If worsening symptoms - will refer to ENT next. Consider adding on environmental injections after finished build up with the venoms.  Hymenoptera allergy Past history - Went to ER few times after stings due to hives and swelling. Never required Epi. 2023 bloodwork positive to honeybee, white faced hornet, yellow jacket, wasp, yellow hornet and bumblebee. Continue to avoid. Recommend starting injections for this first - 3 shots Check with insurance company and make first shot appointment for next year.  For mild symptoms you can take over the counter antihistamines such as Benadryl and monitor symptoms closely. If symptoms worsen or if you have severe symptoms including breathing issues, throat closure, significant swelling, whole body hives, severe diarrhea and vomiting, lightheadedness then inject epinephrine and seek immediate medical care afterwards. Action plan in place.  Anaphylactic reaction due to food, subsequent encounter Past history - Upper body itching after eating mixed nuts in December 2023. Has  eaten peanut butter since then with no issues. 2023 skin testing showed: Positive to [place] nuts. 2023 bloodwork positive to [place] nuts, borderline to walnuts, pecan and pistachio.  More likely to have anaphylactic reaction to [place] nuts, walnuts. Continue strict avoidance of tree nuts. For mild symptoms you can take over the counter antihistamines such as Benadryl and monitor symptoms closely. If symptoms worsen or if you have severe symptoms including breathing issues, throat closure, significant swelling, whole body hives, severe diarrhea and vomiting, lightheadedness then inject epinephrine and seek immediate medical care afterwards.  Return in about 2 months (around 12/15/2022).  Meds ordered this encounter  Medications   azithromycin (ZITHROMAX Z-PAK) 250 MG tablet    Sig: 2 tablets on day 1, 1 tablet once a day on days 2-5.    Dispense:  6 each    Refill:  0   benzonatate (TESSALON PERLES) 100 MG capsule    Sig: Take 1 capsule (100 mg total) by mouth 3 (three) times daily as needed for cough.    Dispense:  30 capsule    Refill:  0   Lab Orders  No laboratory test(s) ordered today    Diagnostics: None.  Medication List:  Current Outpatient Medications  Medication Sig Dispense Refill   albuterol (VENTOLIN HFA) 108 (90 Base) MCG/ACT inhaler USE 2 INHALATIONS 15-20 MINUTES APART EVERY 4 HOURS AS NEEDED TO RESCUE ASTHMA 48 g 3   Albuterol-Budesonide (AIRSUPRA) 90-80 MCG/ACT AERO Inhale 2 puffs into the lungs every 4 (four) hours as needed (coughing, wheezing, chest tightness). Do not exceed  12 puffs in 24 hours. 10.7 g 2   azelastine (ASTELIN) 0.1 % nasal spray Place 1-2 sprays into both nostrils 2 (two) times daily as needed (nasal drainage). Use in each nostril as directed 30 mL 5   azithromycin (ZITHROMAX Z-PAK) 250 MG tablet 2 tablets on day 1, 1 tablet once a day on days 2-5. 6 each 0   benzonatate (TESSALON PERLES) 100 MG capsule Take 1 capsule (100 mg total) by mouth 3  (three) times daily as needed for cough. 30 capsule 0   bisoprolol (ZEBETA) 5 MG tablet Take 1 tablet (5 mg total) by mouth daily. 90 tablet 3   cetirizine (ZYRTEC ALLERGY) 10 MG tablet Take 1 tablet (10 mg total) by mouth daily. 30 tablet 0   Cholecalciferol (VITAMIN D) 125 MCG (5000 UT) CAPS Take by mouth.     ciprofloxacin (CILOXAN) 0.3 % ophthalmic solution Place 1 drop into both eyes daily as needed.     Dulaglutide (TRULICITY) 4.5 IN/8.6VE SOPN Inject 4.5 mg into Skin every 7 days for Diabetes              (Dx: e11.29) 6 mL 3   EPINEPHrine (EPIPEN 2-PAK) 0.3 mg/0.3 mL IJ SOAJ injection Inject 0.3 mg into the muscle as needed for anaphylaxis. 2 each 2   escitalopram (LEXAPRO) 10 MG tablet Take 1 tablet Daily for Mood 90 tablet 3   fenofibrate micronized (LOFIBRA) 134 MG capsule TAKE 1 CAPSULE BY MOUTH ONCE DAILY FOR  TRIGLYCERIDES  (BLOOD  FATS) 90 capsule 0   fluocinonide (LIDEX) 0.05 % external solution Apply 1 Application topically daily as needed.     fluticasone (FLONASE) 50 MCG/ACT nasal spray Place 1 spray into both nostrils 2 (two) times daily as needed (nasal congestion). 16 g 5   fluticasone furoate-vilanterol (BREO ELLIPTA) 200-25 MCG/ACT AEPB Inhale 1 puff into the lungs daily. Rinse mouth after each use. 60 each 3   HYDROcodone-acetaminophen (NORCO) 5-325 MG tablet Take 1/2 to 1 tablet every 3 to 4 hours as needed for Severe Pain 10 tablet 0   ketoconazole (NIZORAL) 2 % cream Apply 1 Application topically daily as needed.     meloxicam (MOBIC) 15 MG tablet TAKE 1/2 TO 1 (ONE-HALF TO ONE) TABLET BY MOUTH ONCE DAILY WITH FOOD FOR PAIN AND FOR INFLAMMATION 90 tablet 0   montelukast (SINGULAIR) 10 MG tablet Take  1 tablet  Daily for Allergies                                       /                                                  TAKE                                    BY                             MOUTH  ONCE DAILY 90 tablet 3   omeprazole  (PRILOSEC) 20 MG capsule Take 1 capsule (20 mg total) by mouth daily. 90 capsule 0   phentermine (ADIPEX-P) 37.5 MG tablet TAKE 1 TABLET EVERY MORNING FOR DIETING & WEIGHT LOSS 30 tablet 0   sildenafil (VIAGRA) 100 MG tablet TAKE ONE TABLET BY MOUTH ONE TIME DAILY AS NEEDED 30 tablet 0   tamsulosin (FLOMAX) 0.4 MG CAPS capsule TAKE 1 CAPSULE BY MOUTH AT BEDTIME FOR PROSTATE 90 capsule 0   triamcinolone cream (KENALOG) 0.1 % Apply 1 Application topically 2 (two) times daily.     valsartan-hydrochlorothiazide (DIOVAN-HCT) 80-12.5 MG tablet Take 1 tablet by mouth daily. for blood pressure 90 tablet 0   No current facility-administered medications for this visit.   Allergies: Allergies  Allergen Reactions   Bee Venom Anaphylaxis   Pine Anaphylaxis   [place] Nut (Berthollefia [place]) Skin Test     pruritus   Lisinopril Other (See Comments)    Patient intolerant of drug which caused his tongue to tingle.   Penicillins Itching   Celebrex [Celecoxib]    I reviewed his past medical history, social history, family history, and environmental history and no significant changes have been reported from his previous visit.  Review of Systems  Constitutional:  Negative for appetite change, chills, fever and unexpected weight change.  HENT:  Positive for congestion, postnasal drip and rhinorrhea.   Eyes:  Negative for itching.  Respiratory:  Positive for cough. Negative for chest tightness, shortness of breath and wheezing.   Cardiovascular:  Negative for chest pain.  Gastrointestinal:  Negative for abdominal pain.  Genitourinary:  Negative for difficulty urinating.  Skin:  Negative for rash.  Allergic/Immunologic: Positive for environmental allergies and food allergies.    Objective: Physical Exam Not obtained as encounter was done via telephone.   Previous notes and tests were reviewed.  I discussed the assessment and treatment plan with the patient. The patient was provided an opportunity  to ask questions and all were answered. The patient agreed with the plan and demonstrated an understanding of the instructions. After visit summary/patient instructions available via mychart.   The patient was advised to call back or seek an in-person evaluation if the symptoms worsen or if the condition fails to improve as anticipated.  I provided 11 minutes of non-face-to-face time during this encounter.  It was my pleasure to participate in Benedict Kue care today. Please feel free to contact me with any questions or concerns.   Sincerely,  Rexene Alberts, DO Allergy & Immunology  [facility] office: [place] office: [phone]

## 2022-10-14 NOTE — Telephone Encounter (Signed)
Please call patient.  Have him schedule a visit with Webb Silversmith so we can take vitals and take a listen to his lungs again to make sure he didn't develop pneumonia.

## 2022-10-14 NOTE — Patient Instructions (Addendum)
Respiratory infection See below for symptomatic treatment. Take zpak as prescribed.  If you start having fevers - recommend to check for flu and covid. May take tessalon perles '100mg'$  three times a day as needed for coughing.  Use nettipot twice a day.  If not better, will need to be evaluated in person next.   Breathing Daily controller medication(s): continue Breo 266mg 1 puff once a day and rinse mouth after each use.  May use Airsupra rescue inhaler 2 puffs every 4 to 6 hours as needed for shortness of breath, chest tightness, coughing, and wheezing. Do not use more than 12 puffs in 24 hours. May use Airsupra rescue inhaler 2 puffs 5 to 15 minutes prior to strenuous physical activities. Monitor frequency of use. Rinse mouth after each use.   Breathing control goals:  Full participation in all desired activities (may need albuterol before activity) Albuterol use two times or less a week on average (not counting use with activity) Cough interfering with sleep two times or less a month Oral steroids no more than once a year No hospitalizations  Food 2023 testing: positive to b[place]olivianuts, borderline to walnuts, pecan and pistachio.  More likely to have anaphylactic reaction to BB[place]ivianuts, walnuts. Continue strict avoidance of tree nuts. Okay with eating peanuts.   Bee stings 2023 bloodwork positive to honeybee, white faced hornet, yellow jacket, wasp, yellow hornet and bumblebee. Continue to avoid. Recommend starting injections for this first - 3 shots Check with insurance company and make first shot appointment for next year.  For mild symptoms you can take over the counter antihistamines such as Benadryl and monitor symptoms closely. If symptoms worsen or if you have severe symptoms including breathing issues, throat closure, significant swelling, whole body hives, severe diarrhea and vomiting, lightheadedness then inject epinephrine and seek immediate medical care afterwards. Action  plan in place.   Environmental allergies 2023 skin testing positive to dust mites, mold and cockroach. 2023 bloodwork positive to dust mites, grass, ragweed.  Borderline to cat, cockroach, tree, weed pollen. Continue environmental control measures as below. Continue Singulair (montelukast) '10mg'$  daily at night. Use over the counter antihistamines such as Zyrtec (cetirizine), Claritin (loratadine), Allegra (fexofenadine), or Xyzal (levocetirizine) daily as needed. May take twice a day during allergy flares. May switch antihistamines every few months. Use Flonase (fluticasone) nasal spray 1 spray per nostril twice a day as needed for nasal congestion.  Use azelastine nasal spray 1-2 sprays per nostril twice a day as needed for runny nose/drainage. Nasal saline spray (i.e., Simply Saline) or nasal saline lavage (i.e., NeilMed) is recommended as needed and prior to medicated nasal sprays.  Infections Keep track of infections and antibiotics use.  Penicillin allergy: Consider penicillin allergy skin testing and in office drug challenge in the future.  Over 90% of people with history of penicillin allergy which occurred over 10 years ago are found to be non-allergic.  You must be off antihistamines for 3-5 days before. Plan on being in the office for 2-3 hours. You must call to schedule an appointment and specify it's for a drug challenge.  A few days prior to the appointment, I will send in a prescription for amoxicillin liquid which you must bring to the appointment as well.   Follow up in 2 months or sooner if needed.  Drink plenty of fluids. Water, juice, clear broth or warm lemon water are good choices. Avoid caffeine and alcohol, which can dehydrate you. Eat chicken soup. Chicken soup and other  warm fluids can be soothing and loosen congestion. Rest. Adjust your room's temperature and humidity. Keep your room warm but not overheated. If the air is dry, a cool-mist humidifier or vaporizer  can moisten the air and help ease congestion and coughing. Keep the humidifier clean to prevent the growth of bacteria and molds. Soothe your throat. Perform a saltwater gargle. Dissolve one-quarter to a half teaspoon of salt in a 4- to 8-ounce glass of warm water. This can relieve a sore or scratchy throat temporarily. Use saline nasal drops. To help relieve nasal congestion, try saline nasal drops. You can buy these drops over the counter, and they can help relieve symptoms ? even in children. Take over-the-counter cold and cough medications. For adults and children older than 5, over-the-counter decongestants, antihistamines and pain relievers might offer some symptom relief. However, they won't prevent a cold or shorten its duration.

## 2022-10-14 NOTE — Telephone Encounter (Signed)
Patient called and stated that he came in and saw Dr Maudie Mercury a few weeks back and that she gave him some antibiotics. Patient states that he is not fully better yet and states that he is congested in his head. Patient is requesting that a prescription be called in for Benzonatate for his cough or any good working cough medicine. Patient also thinks he may need a different antibiotic. Patient pharmacy is [facility] on [address]. Patients call back number is [phone]

## 2022-10-17 DIAGNOSIS — G4733 Obstructive sleep apnea (adult) (pediatric): Secondary | ICD-10-CM | POA: Diagnosis not present

## 2022-10-25 ENCOUNTER — Encounter: Payer: Self-pay | Admitting: Internal Medicine

## 2022-10-25 NOTE — Progress Notes (Unsigned)
Future Appointments  Date Time Provider Department  10/26/2022 10:30 AM Unk Pinto, MD [facility]  12/07/2022  8:30 AM Garnet Sierras, DO [facility]  02/23/2023  3:00 PM Unk Pinto, MD [facility]    History of Present Illness:       This very nice 67 y.o. MWM  with HTN, HLD, Pre-Diabetes and Vitamin D Deficiency presents for 6 month follow up. Chest CT scan in Oct 2020 showed Aortic Atherosclerosis.       Patient is treated for HTN & BP has been controlled at home. Today's BP is at goal -                      . Patient has had no complaints of any cardiac type chest pain, palpitations, dyspnea Vertell Limber /PND, dizziness, claudication, or dependent edema.       Hyperlipidemia is controlled with diet & meds. Patient denies myalgias or other med SE's. Last Lipids were at goal except elevated Trig's :  Lab Results  Component Value Date   CHOL 143 08/11/2022   HDL 32 (L) 08/11/2022   LDLCALC 77 08/11/2022   TRIG 261 (H) 08/11/2022   CHOLHDL 4.5 08/11/2022     Also, the patient has history of PreDiabetes & Insulin Resistance and in Mat 2023 his A1c was 6.5% entering into T2_DM  and has had no symptoms of reactive hypoglycemia, diabetic polys, paresthesias or visual blurring.  Last A1c was near goal :  Lab Results  Component Value Date   HGBA1C 5.9 (H) 08/11/2022                                                           Further, the patient also has history of Vitamin D Deficiency and supplements vitamin D without any suspected side-effects. Last vitamin D was still slightly low :  Lab Results  Component Value Date   VD25OH 53 02/18/2022       Current Outpatient Medications  Medication Instructions   albuterol HFA inhaler USE 2 INHALATIONS  EVERY 4 HOURS AS NEEDED TO RESCUE ASTHMA   Albuterol-Budesonide (AIRSUPRA) 90-80 MCG/ACT AERO 2 puffs Every 4 hours PRN, Do not exceed 12 puffs in 24 hours.   azelastine (ASTELIN) 0.1 % nasal spray 1-2 sprays times daily PRN, Use  in each nostril   bisoprolol (ZEBETA) 5 mg, Oral, Daily   cetirizine (ZYRTEC ALLERGY) 10 mg, Oral, Daily   Cholecalciferol (VITAMIN D) 125 MCG (5000 UT) CAPS Oral   ciprofloxacin (CILOXAN) 0.3 % ophthalmic solution 1 drop, Both Eyes, Daily PRN   Dulaglutide (TRULICITY) 4.5 KZ/6.0FU SOPN Inject 4.5 mg into Skin every 7 days   EPINEPHrine (EPIPEN 2-PAK) 0.3 mg, Intramuscular, As needed   escitalopram (LEXAPRO) 10 MG tablet Take 1 tablet Daily for Mood   fenofibrate 134 MG capsule TAKE 1 CAPSULE  DAILY FOR  TRIGLYCERIDES  (BLOOD  FATS)   fluocinonide (LIDEX) 0.05 % external solution 1 Application, Topical, Daily PRN   fluticasone (FLONASE) 50 MCG/ACT nasal spray 1 spray, Each Nare, 2 times daily PRN   BREO ELLIPTA 200-25 MCG/ACT AEPB 1 puff, Inhalation, Daily   HYDROcodone-acetaminophen  5-325 MG tablet Take 1/2 to 1 tablet every 3 to 4 hours as needed    ketoconazole (NIZORAL) 2 % cream 1 Application,  Topical, Daily PRN   meloxicam (MOBIC) 15 MG tablet TAKE 1/2 TO 1 TABLET DAILY   montelukast (SINGULAIR) 10 MG tablet Take  1 tablet  Daily   omeprazole (PRILOSEC) 20 mg, Oral, Daily   phentermine (ADIPEX-P) 37.5 MG tablet TAKE 1 TABLET EVERY MORNING    sildenafil (VIAGRA) 100 MG tablet TAKE ONE TABLET DAILY AS NEEDED   tamsulosin (FLOMAX) 0.4 MG CAPS capsule TAKE 1 CAPSULE AT BEDTIME    triamcinolone cream (KENALOG) 0.1 % 1 Application 2 times daily   valsartan-hctz 80-12.5 MG tablet 1 tablet, Daily, for blood pressure     Allergies  Allergen Reactions   Bee Venom Anaphylaxis   Pine Anaphylaxis   Lisinopril Other (See Comments)    Patient intolerant of drug which caused his tongue to tingle.   Celebrex [Celecoxib]      PMHx:   Past Medical History:  Diagnosis Date   Arthritis    Asthma    Back pain    Chronic pain of both shoulders    Depression    Dyspnea    Elevated glucose    Environmental allergies    GERD (gastroesophageal reflux disease)    HLD (hyperlipidemia)     HTN (hypertension)    Insomnia    Obesity    OSA on CPAP 08/01/2014   Reflux    Trigger finger      Immunization History  Administered Date(s) Administered   Influenza Inj Mdck Quad With Preservative 08/19/2017, 07/25/2019   Influenza Split 08/06/2015   Influenza, High Dose Seasonal PF 08/07/2020, 07/15/2021   Influenza,inj,quad, With Preservative 08/20/2016, 07/20/2019   Moderna Sars-Covid-2 Vacc 01/04/2020, 01/25/2020   PFIZER  SARS-COV-2 Vacc 08/27/2020   PPD Test 06/03/2018, 11/28/2019   Pneumococcal -23 08/20/2016   Tdap 11/06/2016   Zoster, Live 11/06/2016     Past Surgical History:  Procedure Laterality Date   APPENDECTOMY  07/2012   bone spur removal     HEMORRHOID SURGERY  09/2012   HERNIA REPAIR      FHx:    Reviewed / unchanged  SHx:    Reviewed / unchanged   Systems Review:  Constitutional: Denies fever, chills, wt changes, headaches, insomnia, fatigue, night sweats, change in appetite. Eyes: Denies redness, blurred vision, diplopia, discharge, itchy, watery eyes.  ENT: Denies discharge, congestion, post nasal drip, epistaxis, sore throat, earache, hearing loss, dental pain, tinnitus, vertigo, sinus pain, snoring.  CV: Denies chest pain, palpitations, irregular heartbeat, syncope, dyspnea, diaphoresis, orthopnea, PND, claudication or edema. Respiratory: denies cough, dyspnea, DOE, pleurisy, hoarseness, laryngitis, wheezing.  Gastrointestinal: Denies dysphagia, odynophagia, heartburn, reflux, water brash, abdominal pain or cramps, nausea, vomiting, bloating, diarrhea, constipation, hematemesis, melena, hematochezia  or hemorrhoids. Genitourinary: Denies dysuria, frequency, urgency, nocturia, hesitancy, discharge, hematuria or flank pain. Musculoskeletal: Denies arthralgias, myalgias, stiffness, jt. swelling, pain, limping or strain/sprain.  Skin: Denies pruritus, rash, hives, warts, acne, eczema or change in skin lesion(s). Neuro: No weakness, tremor,  incoordination, spasms, paresthesia or pain. Psychiatric: Denies confusion, memory loss or sensory loss. Endo: Denies change in weight, skin or hair change.  Heme/Lymph: No excessive bleeding, bruising or enlarged lymph nodes.  Physical Exam  There were no vitals taken for this visit.  Appears  well nourished, well groomed  and in no distress.  Eyes: PERRLA, EOMs, conjunctiva no swelling or erythema. Sinuses: No frontal/maxillary tenderness ENT/Mouth: EAC's clear, TM's nl w/o erythema, bulging. Nares clear w/o erythema, swelling, exudates. Oropharynx clear without erythema or exudates. Oral hygiene is good. Tongue normal, non  obstructing. Hearing intact.  Neck: Supple. Thyroid not palpable. Car 2+/2+ without bruits, nodes or JVD. Chest: Respirations nl with BS clear & equal w/o rales, rhonchi, wheezing or stridor.  Cor: Heart sounds normal w/ regular rate and rhythm without sig. murmurs, gallops, clicks or rubs. Peripheral pulses normal and equal  without edema.  Abdomen: Soft & bowel sounds normal. Non-tender w/o guarding, rebound, hernias, masses or organomegaly.  Lymphatics: Unremarkable.  Musculoskeletal: Full ROM all peripheral extremities, joint stability, 5/5 strength and normal gait.  Skin: Warm, dry without exposed rashes, lesions or ecchymosis apparent.  Neuro: Cranial nerves intact, reflexes equal bilaterally. Sensory-motor testing grossly intact. Tendon reflexes grossly intact.  Pysch: Alert & oriented x 3.  Insight and judgement nl & appropriate. No ideations.  Assessment and Plan:  1. Essential hypertension  - Continue medication, monitor blood pressure at home.  - Continue DASH diet.  Reminder to go to the ER if any CP,  SOB, nausea, dizziness, severe HA, changes vision/speech.   - CBC with Differential/Platelet - COMPLETE METABOLIC PANEL WITH GFR - Magnesium - TSH  2. Hyperlipidemia associated with type 2 diabetes mellitus ([place])  - Continue diet/meds, exercise,&  lifestyle modifications.  - Continue monitor periodic cholesterol/liver & renal functions    - Lipid panel - TSH  3. Type 2 diabetes mellitus with stage 2 chronic  kidney disease, without long-term current use of insulin (HCC)  - Continue diet, exercise  - Lifestyle modifications.  - Monitor appropriate labs    - Hemoglobin A1c - Insulin, random  4. Vitamin D deficiency - Continue supplementation   - VITAMIN D 25 Hydroxy   5. Thoracic aortic atherosclerosis ([place]) by Chest ct SCAN 0N 08/09/2019  - Lipid panel  6. Medication management  - CBC with Differential/Platelet - COMPLETE METABOLIC PANEL WITH GFR - Magnesium - Lipid panel - TSH - Hemoglobin A1c - Insulin, random - VITAMIN D 25 Hydroxy          Discussed  regular exercise, BP monitoring, weight control to achieve/maintain BMI less than 25 and discussed med and SE's. Recommended labs to assess and monitor clinical status with further disposition pending results of labs.  I discussed the assessment and treatment plan with the patient. The patient was provided an opportunity to ask questions and all were answered. The patient agreed with the plan and demonstrated an understanding of the instructions.  I provided over 30 minutes of exam, counseling, chart review and  complex critical decision making.        The patient was advised to call back or seek an in-person evaluation if the symptoms worsen or if the condition fails to improve as anticipated.   Kirtland Bouchard, MD

## 2022-10-25 NOTE — Patient Instructions (Signed)

## 2022-10-26 ENCOUNTER — Encounter: Payer: Self-pay | Admitting: Internal Medicine

## 2022-10-26 ENCOUNTER — Ambulatory Visit (INDEPENDENT_AMBULATORY_CARE_PROVIDER_SITE_OTHER): Payer: Medicare HMO | Admitting: Internal Medicine

## 2022-10-26 DIAGNOSIS — N182 Chronic kidney disease, stage 2 (mild): Secondary | ICD-10-CM

## 2022-10-26 DIAGNOSIS — Z6837 Body mass index (BMI) 37.0-37.9, adult: Secondary | ICD-10-CM

## 2022-10-26 DIAGNOSIS — J302 Other seasonal allergic rhinitis: Secondary | ICD-10-CM | POA: Diagnosis not present

## 2022-10-26 DIAGNOSIS — I10 Essential (primary) hypertension: Secondary | ICD-10-CM

## 2022-10-26 DIAGNOSIS — Z79899 Other long term (current) drug therapy: Secondary | ICD-10-CM

## 2022-10-26 DIAGNOSIS — E1169 Type 2 diabetes mellitus with other specified complication: Secondary | ICD-10-CM

## 2022-10-26 DIAGNOSIS — E559 Vitamin D deficiency, unspecified: Secondary | ICD-10-CM

## 2022-10-26 DIAGNOSIS — E785 Hyperlipidemia, unspecified: Secondary | ICD-10-CM | POA: Diagnosis not present

## 2022-10-26 DIAGNOSIS — I700 Atherosclerosis of aorta: Secondary | ICD-10-CM | POA: Diagnosis not present

## 2022-10-26 DIAGNOSIS — E1122 Type 2 diabetes mellitus with diabetic chronic kidney disease: Secondary | ICD-10-CM

## 2022-10-26 MED ORDER — IPRATROPIUM BROMIDE 0.06 % NA SOLN: NASAL | 3 refills | Status: DC

## 2022-10-26 MED ORDER — TIRZEPATIDE 2.5 MG/0.5ML ~~LOC~~ SOAJ: SUBCUTANEOUS | 0 refills | Status: DC

## 2022-10-26 NOTE — Addendum Note (Signed)
Addended by: Unk Pinto on: 10/26/2022 02:26 PM   Modules accepted: Orders

## 2022-10-27 LAB — HEMOGLOBIN A1C
Hgb A1c MFr Bld: 6.2 % of total Hgb — ABNORMAL HIGH (ref <5.7)
Mean Plasma Glucose: 131 mg/dL
eAG (mmol/L): 7.3 mmol/L

## 2022-10-27 LAB — CBC WITH DIFFERENTIAL/PLATELET
Absolute Monocytes: 511 cells/uL (ref 200–950)
Basophils Absolute: 29 cells/uL (ref 0–200)
Basophils Relative: 0.4 %
Eosinophils Absolute: 151 cells/uL (ref 15–500)
Eosinophils Relative: 2.1 %
HCT: 43.3 % (ref 38.5–50.0)
Hemoglobin: 15.1 g/dL (ref 13.2–17.1)
Lymphs Abs: 1865 cells/uL (ref 850–3900)
MCH: 32.4 pg (ref 27.0–33.0)
MCHC: 34.9 g/dL (ref 32.0–36.0)
MCV: 92.9 fL (ref 80.0–100.0)
MPV: 10.5 fL (ref 7.5–12.5)
Monocytes Relative: 7.1 %
Neutro Abs: 4644 cells/uL (ref 1500–7800)
Neutrophils Relative %: 64.5 %
Platelets: 195 10*3/uL (ref 140–400)
RBC: 4.66 10*6/uL (ref 4.20–5.80)
RDW: 13.1 % (ref 11.0–15.0)
Total Lymphocyte: 25.9 %
WBC: 7.2 10*3/uL (ref 3.8–10.8)

## 2022-10-27 LAB — COMPLETE METABOLIC PANEL WITH GFR
AG Ratio: 1.7 (calc) (ref 1.0–2.5)
ALT: 44 U/L (ref 9–46)
AST: 23 U/L (ref 10–35)
Albumin: 4.6 g/dL (ref 3.6–5.1)
Alkaline phosphatase (APISO): 70 U/L (ref 35–144)
BUN: 18 mg/dL (ref 7–25)
CO2: 30 mmol/L (ref 20–32)
Calcium: 9.3 mg/dL (ref 8.6–10.3)
Chloride: 104 mmol/L (ref 98–110)
Creat: 1.04 mg/dL (ref 0.70–1.35)
Globulin: 2.7 g/dL (calc) (ref 1.9–3.7)
Glucose, Bld: 148 mg/dL — ABNORMAL HIGH (ref 65–99)
Potassium: 4.2 mmol/L (ref 3.5–5.3)
Sodium: 141 mmol/L (ref 135–146)
Total Bilirubin: 0.7 mg/dL (ref 0.2–1.2)
Total Protein: 7.3 g/dL (ref 6.1–8.1)
eGFR: 79 mL/min/{1.73_m2} (ref >=60)

## 2022-10-27 LAB — VITAMIN D 25 HYDROXY (VIT D DEFICIENCY, FRACTURES): Vit D, 25-Hydroxy: 54 ng/mL (ref 30–100)

## 2022-10-27 LAB — LIPID PANEL
Cholesterol: 158 mg/dL (ref <200)
HDL: 38 mg/dL — ABNORMAL LOW (ref >=40)
LDL Cholesterol (Calc): 87 mg/dL (calc)
Non-HDL Cholesterol (Calc): 120 mg/dL (calc) (ref <130)
Total CHOL/HDL Ratio: 4.2 (calc) (ref <5.0)
Triglycerides: 254 mg/dL — ABNORMAL HIGH (ref <150)

## 2022-10-27 LAB — MAGNESIUM: Magnesium: 2 mg/dL (ref 1.5–2.5)

## 2022-10-27 LAB — INSULIN, RANDOM: Insulin: 142.1 u[IU]/mL — ABNORMAL HIGH

## 2022-10-27 LAB — TSH: TSH: 1.28 mIU/L (ref 0.40–4.50)

## 2022-10-27 NOTE — Progress Notes (Signed)
<><><><><><><><><><><><><><><><><><><><><><><><><><><><><><><><><> <><><><><><><><><><><><><><><><><><><><><><><><><><><><><><><><><> -   Test results slightly outside the reference range are not unusual. If there is anything important, I will review this with you,  otherwise it is considered normal test values.  If you have further questions,  please do not hesitate to contact me at the office or via My Chart.  <><><><><><><><><><><><><><><><><><><><><><><><><><><><><><><><><> <><><><><><><><><><><><><><><><><><><><><><><><><><><><><><><><><>  -  Glucose = 148 - too high - Ideal is less than 100   &  -  A1c = 6.2% - Also  too high - Ideal is less than 5.7%  And   - Insulin = 142.1 is very very high   & shows insulin resistance - a sign of early diabetes and   associated with a 300 % greater risk for   heart attacks, strokes, cancer & Alzheimer type vascular dementia   - All this can be cured  and prevented with losing weight   - get Dr Fara Olden Fuhrman's book 'the End of Diabetes" and "the End of Dieting"  - and add many years of good health to your life. <><><><><><><><><><><><><><><><><><><><><><><><><><><><><><><><><> <><><><><><><><><><><><><><><><><><><><><><><><><><><><><><><><><>  - Total Chol = 158  A  LDL Chol = 87 -   Both  Excellent   - Very low risk for Heart Attack  / Stroke ============================================================ ============================================================  -  and Triglycerides (  254     ) or fats in blood are too high                 (   Ideal or  Goal is less than 150  !  )    - Recommend avoid fried & greasy foods,  sweets / candy,   - Avoid white rice  (brown or wild rice or Quinoa is OK),   - Avoid white potatoes  (sweet potatoes are OK)   - Avoid anything made from white flour  - bagels, doughnuts, rolls, buns, biscuits, white and   wheat breads, pizza crust and traditional  pasta made of white flour & egg  white  - (vegetarian pasta or spinach or wheat pasta is OK).    - Multi-grain bread is OK - like multi-grain flat bread or  sandwich thins.   - Avoid alcohol in excess.   - Exercise is also important. <><><><><><><><><><><><><><><><><><><><><><><><><><><><><><><><><> <><><><><><><><><><><><><><><><><><><><><><><><><><><><><><><><><>  - Vitamin D = 54 - slightly low - So Please increase your Vit D 5,000 unit capsules to   2 capsules = 10,000 units / day  <><><><><><><><><><><><><><><><><><><><><><><><><><><><><><><><><> <><><><><><><><><><><><><><><><><><><><><><><><><><><><><><><><><>

## 2022-11-02 ENCOUNTER — Ambulatory Visit
Admission: EM | Admit: 2022-11-02 | Discharge: 2022-11-02 | Disposition: A | Discharge status: Home / Self Care | Payer: Medicare HMO | Attending: Physician Assistant | Admitting: Physician Assistant

## 2022-11-02 ENCOUNTER — Encounter: Payer: Self-pay | Admitting: Physician Assistant

## 2022-11-02 DIAGNOSIS — B370 Candidal stomatitis: Secondary | ICD-10-CM | POA: Diagnosis not present

## 2022-11-02 DIAGNOSIS — R0789 Other chest pain: Secondary | ICD-10-CM

## 2022-11-02 MED ORDER — NYSTATIN 100000 UNIT/ML MT SUSP: 500000.0000 [IU] | Freq: Four times a day (QID) | OROMUCOSAL | 0 refills | Status: DC

## 2022-11-02 NOTE — ED Provider Notes (Signed)
[name]    CSN: [id number] Arrival date & time: 11/02/22  0801      History   Chief Complaint No chief complaint on file.   HPI George Montgomery is a 67 y.o. male.   Patient here today for evaluation of pain and exudate to his mouth he states is similar to prior fungal infections caused by his inhaler. He reports symptoms have been ongoing for about 2 days. He has not had any shortness of breath. He denies any vomiting or diarrhea. He does not report any other symptoms.   Patient comes out of room while awaiting discharge requesting to see provider. Once provider entered room patient reports central chest pain. He notes this as a soreness that followed a very sharp right sided chest pain. He denies shortness of breath. He states that he is starting to feel somewhat dizzy and has felt like he is sweating. He has had similar discomfort with indigestion in the past. He has not eaten or taken medication this morning. He states he has drank water and a half of a cup of coffee.   The history is provided by the patient.    Past Medical History:  Diagnosis Date   Arthritis    Asthma    Back pain    Chronic pain of both shoulders    Depression    Dyspnea    Elevated glucose    Environmental allergies    GERD (gastroesophageal reflux disease)    HLD (hyperlipidemia)    HTN (hypertension)    Insomnia    Obesity    OSA on CPAP 08/01/2014   Reflux    Trigger finger     Patient Active Problem List   Diagnosis Date Noted   Penicillin allergy 10/14/2022   Recurrent infections 09/28/2022   Moderate persistent asthma with (acute) exacerbation 09/28/2022   Moderate persistent asthma without complication 43/32/9518   Seasonal and perennial allergic rhinitis 02/24/2022   Anaphylactic reaction due to food, subsequent encounter 02/24/2022   Hymenoptera allergy 02/24/2022   Thoracic aortic atherosclerosis ([place]) by Chest CT scan on10/21/2020. 11/13/2020   Insulin  resistance 11/13/2020   Fatty liver 02/14/2020   Benign prostatic hyperplasia (BPH) with post-void dribbling 02/14/2020   Gastroesophageal reflux disease 06/02/2018   Insomnia 02/22/2018   Chronic pain of both shoulders 11/22/2017   Depression 07/20/2017   Trigger finger, right ring finger 04/07/2017   Hyperlipidemia, mixed 08/06/2015   Essential hypertension 11/06/2014   Cluster headaches 11/06/2014   OSA treated with BiPAP 11/06/2014   Vitamin D deficiency 11/06/2014   Testosterone deficiency 11/06/2014   Morbid obesity ([place]) 11/06/2014    Past Surgical History:  Procedure Laterality Date   APPENDECTOMY  07/2012   bone spur removal     HEMORRHOID SURGERY  09/2012   HERNIA REPAIR         Home Medications    Prior to Admission medications   Medication Sig Start Date End Date Taking? Authorizing Provider  nystatin (MYCOSTATIN) 100000 UNIT/ML suspension Take 5 mLs (500,000 Units total) by mouth 4 (four) times daily. 11/02/22  Yes Francene Finders, PA-C  albuterol (VENTOLIN HFA) 108 (90 Base) MCG/ACT inhaler USE 2 INHALATIONS 15-20 MINUTES APART EVERY 4 HOURS AS NEEDED TO RESCUE ASTHMA 10/31/21   Unk Pinto, MD  Albuterol-Budesonide (AIRSUPRA) 90-80 MCG/ACT AERO Inhale 2 puffs into the lungs every 4 (four) hours as needed (coughing, wheezing, chest tightness). Do not exceed 12 puffs in 24 hours. 09/28/22   Maudie Mercury,  Albin Felling, DO  azelastine (ASTELIN) 0.1 % nasal spray Place 1-2 sprays into both nostrils 2 (two) times daily as needed (nasal drainage). Use in each nostril as directed 09/02/22   Garnet Sierras, DO  benzonatate (TESSALON PERLES) 100 MG capsule Take 1 capsule (100 mg total) by mouth 3 (three) times daily as needed for cough. 10/14/22   Garnet Sierras, DO  bisoprolol (ZEBETA) 5 MG tablet Take 1 tablet (5 mg total) by mouth daily. 08/11/22   Alycia Rossetti, NP  cetirizine (ZYRTEC ALLERGY) 10 MG tablet Take 1 tablet (10 mg total) by mouth daily. 05/18/21   Raspet, Derry Skill, PA-C   Cholecalciferol (VITAMIN D) 125 MCG (5000 UT) CAPS Take by mouth.    [provider]  ciprofloxacin (CILOXAN) 0.3 % ophthalmic solution Place 1 drop into both eyes daily as needed.    [provider]  EPINEPHrine (EPIPEN 2-PAK) 0.3 mg/0.3 mL IJ SOAJ injection Inject 0.3 mg into the muscle as needed for anaphylaxis. 02/24/22   Garnet Sierras, DO  escitalopram (LEXAPRO) 10 MG tablet Take 1 tablet Daily for Mood 05/05/22   Unk Pinto, MD  fenofibrate micronized (LOFIBRA) 134 MG capsule TAKE 1 CAPSULE BY MOUTH ONCE DAILY FOR  TRIGLYCERIDES  (BLOOD  FATS) 08/28/22   Alycia Rossetti, NP  fluocinonide (LIDEX) 0.05 % external solution Apply 1 Application topically daily as needed.    [provider]  fluticasone (FLONASE) 50 MCG/ACT nasal spray Place 1 spray into both nostrils 2 (two) times daily as needed (nasal congestion). 09/28/22   Garnet Sierras, DO  fluticasone furoate-vilanterol (BREO ELLIPTA) 200-25 MCG/ACT AEPB Inhale 1 puff into the lungs daily. Rinse mouth after each use. 07/27/22   Garnet Sierras, DO  HYDROcodone-acetaminophen (NORCO) 5-325 MG tablet Take 1/2 to 1 tablet every 3 to 4 hours as needed for Severe Pain 07/06/22   Darrol Jump, NP  ipratropium (ATROVENT) 0.06 % nasal spray Use 1 to 2 sprays each nostril 2 to 3 x /day as needed 10/26/22   Unk Pinto, MD  ketoconazole (NIZORAL) 2 % cream Apply 1 Application topically daily as needed.    [provider]  meloxicam (MOBIC) 15 MG tablet TAKE 1/2 TO 1 (ONE-HALF TO ONE) TABLET BY MOUTH ONCE DAILY WITH FOOD FOR PAIN AND FOR INFLAMMATION 09/15/22   Unk Pinto, MD  montelukast (SINGULAIR) 10 MG tablet Take  1 tablet  Daily for Allergies                                       /                                                  TAKE                                    BY                             MOUTH  ONCE DAILY 07/19/22   Unk Pinto, MD  omeprazole  (PRILOSEC) 20 MG capsule Take 1 capsule (20 mg total) by mouth daily. 08/28/22   Alycia Rossetti, NP  phentermine (ADIPEX-P) 37.5 MG tablet TAKE 1 TABLET EVERY MORNING FOR DIETING & WEIGHT LOSS 08/13/22   Alycia Rossetti, NP  sildenafil (VIAGRA) 100 MG tablet TAKE ONE TABLET BY MOUTH ONE TIME DAILY AS NEEDED 09/27/21   Liane Comber, NP  tamsulosin (FLOMAX) 0.4 MG CAPS capsule TAKE 1 CAPSULE BY MOUTH AT BEDTIME FOR PROSTATE 08/28/22   Alycia Rossetti, NP  tirzepatide [facility]) 2.5 MG/0.5ML Pen Inject 1 pen (2.5 mg) into skin every 7 days for Diabetes  (Dx:  e11.29) 10/26/22   Unk Pinto, MD  triamcinolone cream (KENALOG) 0.1 % Apply 1 Application topically 2 (two) times daily.    [provider]  valsartan-hydrochlorothiazide (DIOVAN-HCT) 80-12.5 MG tablet Take 1 tablet by mouth daily. for blood pressure 09/15/22   Unk Pinto, MD    Family History Family History  Problem Relation Age of Onset   Heart disease Mother    Macular degeneration Mother    Hypertension Mother    Hyperlipidemia Mother    Depression Mother    Anxiety disorder Mother    Liver disease Father    Alcoholism Father    Allergic rhinitis Brother    Asthma Brother    Sinusitis Brother    Bronchitis Brother    Stroke Maternal Aunt    Cancer - Colon Paternal Uncle    Cancer - Prostate Paternal Uncle    Cancer - Colon Other    Cancer - Prostate Other    Immunodeficiency Neg Hx    Urticaria Neg Hx    Eczema Neg Hx    Angioedema Neg Hx     Social History Social History   Tobacco Use   Smoking status: Former    Packs/day: 1.00    Years: 40.00    Total pack years: 40.00    Types: Cigarettes    Quit date: 12/03/2012    Years since quitting: 9.9   Smokeless tobacco: Never  Vaping Use   Vaping Use: Never used  Substance Use Topics   Alcohol use: Yes    Alcohol/week: 2.0 standard drinks of alcohol    Types: 2 Standard drinks or equivalent per week    Comment: occ   Drug use: No      Allergies   Bee venom, Pine, [place] nut (berthollefia excelsa) skin test, Lisinopril, Penicillins, and Celebrex [celecoxib]   Review of Systems Review of Systems  Constitutional:  Negative for chills and fever.  HENT:  Positive for mouth sores. Negative for trouble swallowing.   Eyes:  Negative for discharge and redness.  Respiratory:  Negative for shortness of breath.   Cardiovascular:  Positive for chest pain.  Gastrointestinal:  Negative for abdominal pain, nausea and vomiting.  Neurological:  Positive for dizziness. Negative for numbness.     Physical Exam Triage Vital Signs ED Triage Vitals  Enc Vitals Group     BP      Pulse      Resp      Temp      Temp src      SpO2      Weight      Height      Head Circumference      Peak Flow      Pain Score      Pain Loc  Pain Edu?      Excl. in [place]?    No data found.  Updated Vital Signs BP 138/74 (BP Location: Left Arm)   Pulse (!) 59   Temp 98 F (36.7 C) (Oral)   Resp 15   SpO2 94%      Physical Exam Vitals and nursing note reviewed.  Constitutional:      General: He is not in acute distress.    Appearance: Normal appearance. He is not ill-appearing.     Comments: Initially alert, normal appearance, happy, when experiencing reported chest pain appears anxious, minimally diaphoretic, upon return to room after EKG, patient reported belching, patient appears normal, happy  HENT:     Head: Normocephalic and atraumatic.     Mouth/Throat:     Mouth: Mucous membranes are moist.     Pharynx: Posterior oropharyngeal erythema present.     Comments: Minimal white exudate noted to bilateral corners of mouth Eyes:     Conjunctiva/sclera: Conjunctivae normal.  Cardiovascular:     Rate and Rhythm: Regular rhythm. Bradycardia present.     Heart sounds: Normal heart sounds.  Pulmonary:     Effort: Pulmonary effort is normal. No respiratory distress.     Breath sounds: Normal breath sounds. No wheezing, rhonchi  or rales.  Neurological:     Mental Status: He is alert.  Psychiatric:        Mood and Affect: Mood normal.        Behavior: Behavior normal.        Thought Content: Thought content normal.      UC Treatments / Results  Labs (all labs ordered are listed, but only abnormal results are displayed) Labs Reviewed - No data to display  EKG   Radiology No results found.  Procedures Procedures (including critical care time)  Medications Ordered in UC Medications - No data to display  Initial Impression / Assessment and Plan / UC Course  I have reviewed the triage vital signs and the nursing notes.  Pertinent labs & imaging results that were available during my care of the patient were reviewed by me and considered in my medical decision making (see chart for details).    Will treat suspected oral thrush with nystatin. EKG without significant changes compared to prior. Discussed that EKG does not rule out MI, and that he would need troponins for further evaluation to officially rule out cardiac etiology of symptoms. Patient is no longer symptomatic, low suspicion for ACS- encouraged evaluation in the ED but ultimately patient's decision. Strongly advised he call 911 with any recurrent chest pain, shortness of breath, dizziness. Patient expresses understanding.   Final Clinical Impressions(s) / UC Diagnoses   Final diagnoses:  Thrush  Atypical chest pain   Discharge Instructions   None    ED Prescriptions     Medication Sig Dispense Auth. Provider   nystatin (MYCOSTATIN) 100000 UNIT/ML suspension Take 5 mLs (500,000 Units total) by mouth 4 (four) times daily. 60 mL Francene Finders, PA-C      PDMP not reviewed this encounter.   Francene Finders, PA-C 11/02/22 [phone]

## 2022-11-02 NOTE — ED Notes (Signed)
Pt triaged by PA on site

## 2022-11-06 DIAGNOSIS — M79641 Pain in right hand: Secondary | ICD-10-CM | POA: Diagnosis not present

## 2022-11-06 DIAGNOSIS — M79642 Pain in left hand: Secondary | ICD-10-CM | POA: Diagnosis not present

## 2022-11-06 DIAGNOSIS — M19041 Primary osteoarthritis, right hand: Secondary | ICD-10-CM | POA: Diagnosis not present

## 2022-11-16 DIAGNOSIS — G4733 Obstructive sleep apnea (adult) (pediatric): Secondary | ICD-10-CM | POA: Diagnosis not present

## 2022-11-17 ENCOUNTER — Other Ambulatory Visit: Payer: Self-pay | Admitting: Internal Medicine

## 2022-11-17 DIAGNOSIS — N529 Male erectile dysfunction, unspecified: Secondary | ICD-10-CM

## 2022-11-17 MED ORDER — SILDENAFIL CITRATE 100 MG PO TABS: ORAL_TABLET | ORAL | 1 refills | Status: AC

## 2022-11-17 MED ORDER — SILDENAFIL CITRATE 100 MG PO TABS: ORAL_TABLET | ORAL | 0 refills | Status: DC

## 2022-11-18 ENCOUNTER — Other Ambulatory Visit: Payer: Self-pay | Admitting: Nurse Practitioner

## 2022-11-18 DIAGNOSIS — N401 Enlarged prostate with lower urinary tract symptoms: Secondary | ICD-10-CM

## 2022-11-22 ENCOUNTER — Encounter: Payer: Self-pay | Admitting: Emergency Medicine

## 2022-11-22 ENCOUNTER — Other Ambulatory Visit: Payer: Self-pay

## 2022-11-22 ENCOUNTER — Ambulatory Visit
Admission: EM | Admit: 2022-11-22 | Discharge: 2022-11-22 | Disposition: A | Discharge status: Home / Self Care | Payer: Medicare HMO | Attending: Physician Assistant | Admitting: Physician Assistant

## 2022-11-22 DIAGNOSIS — M25511 Pain in right shoulder: Secondary | ICD-10-CM | POA: Diagnosis not present

## 2022-11-22 MED ORDER — TIZANIDINE HCL 4 MG PO TABS: 4.0000 mg | ORAL_TABLET | Freq: Four times a day (QID) | ORAL | 0 refills | Status: DC | PRN

## 2022-11-22 MED ORDER — PREDNISONE 20 MG PO TABS: 40.0000 mg | ORAL_TABLET | Freq: Every day | ORAL | 0 refills | Status: AC

## 2022-11-22 NOTE — ED Triage Notes (Signed)
Pt here for right shoulder pain x 4 days; denies injury but sts hx of chronic issues with shoulder

## 2022-11-22 NOTE — ED Provider Notes (Signed)
[name]    CSN: [id number] Arrival date & time: 11/22/22  6579      History   Chief Complaint Chief Complaint  Patient presents with   Shoulder Pain    HPI George Montgomery is a 67 y.o. male.   Patient here today for evaluation of right shoulder pain that started 4 days ago.  He reports that movement of his neck specifically full extension and rotation to the right causes a shooting pain from his neck into his right trapezius and shoulder.  Pain does not radiate down his right arm.  He denies any numbness.  He has tried taking over-the-counter medication without resolution.  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  The history is provided by the patient.    Past Medical History:  Diagnosis Date   Arthritis    Asthma    Back pain    Chronic pain of both shoulders    Depression    Dyspnea    Elevated glucose    Environmental allergies    GERD (gastroesophageal reflux disease)    HLD (hyperlipidemia)    HTN (hypertension)    Insomnia    Obesity    OSA on CPAP 08/01/2014   Reflux    Trigger finger     Patient Active Problem List   Diagnosis Date Noted   Penicillin allergy 10/14/2022   Recurrent infections 09/28/2022   Moderate persistent asthma with (acute) exacerbation 09/28/2022   Moderate persistent asthma without complication 03/83/3383   Seasonal and perennial allergic rhinitis 02/24/2022   Anaphylactic reaction due to food, subsequent encounter 02/24/2022   Hymenoptera allergy 02/24/2022   Thoracic aortic atherosclerosis ([place]) by Chest CT scan on10/21/2020. 11/13/2020   Insulin resistance 11/13/2020   Fatty liver 02/14/2020   Benign prostatic hyperplasia (BPH) with post-void dribbling 02/14/2020   Gastroesophageal reflux disease 06/02/2018   Insomnia 02/22/2018   Chronic pain of both shoulders 11/22/2017   Depression 07/20/2017   Trigger finger, right ring finger  04/07/2017   Hyperlipidemia, mixed 08/06/2015   Essential hypertension 11/06/2014   Cluster headaches 11/06/2014   OSA treated with BiPAP 11/06/2014   Vitamin D deficiency 11/06/2014   Testosterone deficiency 11/06/2014   Morbid obesity ([place]) 11/06/2014    Past Surgical History:  Procedure Laterality Date   APPENDECTOMY  07/2012   bone spur removal     HEMORRHOID SURGERY  09/2012   HERNIA REPAIR         Home Medications    Prior to Admission medications   Medication Sig Start Date End Date Taking? Authorizing Provider  predniSONE (DELTASONE) 20 MG tablet Take 2 tablets (40 mg total) by mouth daily with breakfast for 5 days. 11/22/22 11/27/22 Yes Francene Finders, PA-C  tiZANidine (ZANAFLEX) 4 MG tablet Take 1 tablet (4 mg total) by mouth every 6 (six) hours as needed for muscle spasms. 11/22/22  Yes Francene Finders, PA-C  albuterol (VENTOLIN HFA) 108 (90 Base) MCG/ACT inhaler USE 2 INHALATIONS 15-20 MINUTES APART EVERY 4 HOURS AS NEEDED TO RESCUE ASTHMA 10/31/21   Unk Pinto, MD  Albuterol-Budesonide (AIRSUPRA) 90-80 MCG/ACT AERO Inhale 2 puffs into the lungs every 4 (four) hours as needed (coughing, wheezing, chest tightness). Do not exceed 12 puffs in 24 hours. 09/28/22   Garnet Sierras, DO  azelastine (ASTELIN) 0.1 % nasal spray Place 1-2 sprays into both nostrils 2 (two) times daily as needed (nasal  drainage). Use in each nostril as directed 09/02/22   Garnet Sierras, DO  bisoprolol (ZEBETA) 5 MG tablet Take 1 tablet (5 mg total) by mouth daily. 08/11/22   Alycia Rossetti, NP  cetirizine (ZYRTEC ALLERGY) 10 MG tablet Take 1 tablet (10 mg total) by mouth daily. 05/18/21   Raspet, Derry Skill, PA-C  Cholecalciferol (VITAMIN D) 125 MCG (5000 UT) CAPS Take by mouth.    [provider]  ciprofloxacin (CILOXAN) 0.3 % ophthalmic solution Place 1 drop into both eyes daily as needed.    [provider]  EPINEPHrine (EPIPEN 2-PAK) 0.3 mg/0.3 mL IJ SOAJ injection Inject 0.3 mg into  the muscle as needed for anaphylaxis. 02/24/22   Garnet Sierras, DO  escitalopram (LEXAPRO) 10 MG tablet Take 1 tablet Daily for Mood 05/05/22   Unk Pinto, MD  fenofibrate micronized (LOFIBRA) 134 MG capsule TAKE 1 CAPSULE BY MOUTH ONCE DAILY FOR TRIGLYCERIDES (BLOOD FATS) 11/18/22   Darrol Jump, NP  fluocinonide (LIDEX) 0.05 % external solution Apply 1 Application topically daily as needed.    [provider]  fluticasone (FLONASE) 50 MCG/ACT nasal spray Place 1 spray into both nostrils 2 (two) times daily as needed (nasal congestion). 09/28/22   Garnet Sierras, DO  fluticasone furoate-vilanterol (BREO ELLIPTA) 200-25 MCG/ACT AEPB Inhale 1 puff into the lungs daily. Rinse mouth after each use. 07/27/22   Garnet Sierras, DO  ipratropium (ATROVENT) 0.06 % nasal spray Use 1 to 2 sprays each nostril 2 to 3 x /day as needed 10/26/22   Unk Pinto, MD  ketoconazole (NIZORAL) 2 % cream Apply 1 Application topically daily as needed.    [provider]  meloxicam (MOBIC) 15 MG tablet TAKE 1/2 TO 1 (ONE-HALF TO ONE) TABLET BY MOUTH ONCE DAILY WITH FOOD FOR PAIN AND FOR INFLAMMATION 09/15/22   Unk Pinto, MD  montelukast (SINGULAIR) 10 MG tablet Take  1 tablet  Daily for Allergies                                       /                                                  TAKE                                    BY                             MOUTH                                            ONCE DAILY 07/19/22   Unk Pinto, MD  nystatin (MYCOSTATIN) 100000 UNIT/ML suspension Take 5 mLs (500,000 Units total) by mouth 4 (four) times daily. 11/02/22   Francene Finders, PA-C  omeprazole (PRILOSEC) 20 MG capsule Take 1 capsule (20 mg total) by mouth daily. 08/28/22   Alycia Rossetti, NP  phentermine (ADIPEX-P) 37.5 MG tablet TAKE 1 TABLET EVERY MORNING FOR DIETING &  WEIGHT LOSS 08/13/22   Alycia Rossetti, NP  sildenafil (VIAGRA) 100 MG tablet Take   1/2 to 1 tablet   Daily  as needed for  XXXX 11/17/22   Unk Pinto, MD  tamsulosin (FLOMAX) 0.4 MG CAPS capsule TAKE 1 CAPSULE BY MOUTH AT BEDTIME FOR PROSTATE 11/18/22   Darrol Jump, NP  tirzepatide [facility]) 2.5 MG/0.5ML Pen Inject 1 pen (2.5 mg) into skin every 7 days for Diabetes  (Dx:  e11.29) 10/26/22   Unk Pinto, MD  triamcinolone cream (KENALOG) 0.1 % Apply 1 Application topically 2 (two) times daily.    [provider]  valsartan-hydrochlorothiazide (DIOVAN-HCT) 80-12.5 MG tablet Take 1 tablet by mouth daily. for blood pressure 09/15/22   Unk Pinto, MD    Family History Family History  Problem Relation Age of Onset   Heart disease Mother    Macular degeneration Mother    Hypertension Mother    Hyperlipidemia Mother    Depression Mother    Anxiety disorder Mother    Liver disease Father    Alcoholism Father    Allergic rhinitis Brother    Asthma Brother    Sinusitis Brother    Bronchitis Brother    Stroke Maternal Aunt    Cancer - Colon Paternal Uncle    Cancer - Prostate Paternal Uncle    Cancer - Colon Other    Cancer - Prostate Other    Immunodeficiency Neg Hx    Urticaria Neg Hx    Eczema Neg Hx    Angioedema Neg Hx     Social History Social History   Tobacco Use   Smoking status: Former    Packs/day: 1.00    Years: 40.00    Total pack years: 40.00    Types: Cigarettes    Quit date: 12/03/2012    Years since quitting: 9.9   Smokeless tobacco: Never  Vaping Use   Vaping Use: Never used  Substance Use Topics   Alcohol use: Yes    Alcohol/week: 2.0 standard drinks of alcohol    Types: 2 Standard drinks or equivalent per week    Comment: occ   Drug use: No     Allergies   Bee venom, Pine, [place] nut (berthollefia [place]) skin test, Lisinopril, Penicillins, and Celebrex [celecoxib]   Review of Systems Review of Systems  Constitutional:  Negative for chills and fever.  Eyes:  Negative for discharge and redness.  Musculoskeletal:  Positive for arthralgias  and myalgias.  Neurological:  Negative for numbness.     Physical Exam Triage Vital Signs ED Triage Vitals  Enc Vitals Group     BP      Pulse      Resp      Temp      Temp src      SpO2      Weight      Height      Head Circumference      Peak Flow      Pain Score      Pain Loc      Pain Edu?      Excl. in [place]?    No data found.  Updated Vital Signs BP (!) 135/59 (BP Location: Left Arm)   Pulse (!) 56   Temp 97.9 F (36.6 C) (Oral)   Resp 18   SpO2 95%       Physical Exam Vitals and nursing note reviewed.  Constitutional:      General: He is not in  acute distress.    Appearance: Normal appearance. He is not ill-appearing.  HENT:     Head: Normocephalic and atraumatic.  Eyes:     Conjunctiva/sclera: Conjunctivae normal.  Cardiovascular:     Rate and Rhythm: Normal rate.  Pulmonary:     Effort: Pulmonary effort is normal.  Musculoskeletal:     Comments: No tenderness to palpation to midline cervical spine.  Mild tenderness palpation across right trapezius and right posterior shoulder.  No tenderness palpation noted to right [facility] joint  Neurological:     Mental Status: He is alert.  Psychiatric:        Mood and Affect: Mood normal.        Behavior: Behavior normal.        Thought Content: Thought content normal.      UC Treatments / Results  Labs (all labs ordered are listed, but only abnormal results are displayed) Labs Reviewed - No data to display  EKG   Radiology No results found.  Procedures Procedures (including critical care time)  Medications Ordered in UC Medications - No data to display  Initial Impression / Assessment and Plan / UC Course  I have reviewed the triage vital signs and the nursing notes.  Pertinent labs & imaging results that were available during my care of the patient were reviewed by me and considered in my medical decision making (see chart for details).    Steroid burst and tizanidine prescribed for suspected  cervical radiculopathy versus muscular strain.  Encouraged follow-up with Ortho if no gradual improvement with any further concerns.  Final Clinical Impressions(s) / UC Diagnoses   Final diagnoses:  Acute pain of right shoulder   Discharge Instructions   None    ED Prescriptions     Medication Sig Dispense Auth. Provider   predniSONE (DELTASONE) 20 MG tablet Take 2 tablets (40 mg total) by mouth daily with breakfast for 5 days. 10 tablet Ewell Poe F, PA-C   tiZANidine (ZANAFLEX) 4 MG tablet Take 1 tablet (4 mg total) by mouth every 6 (six) hours as needed for muscle spasms. 30 tablet Francene Finders, PA-C      PDMP not reviewed this encounter.   Francene Finders, PA-C 11/22/22 1227

## 2022-11-26 ENCOUNTER — Ambulatory Visit: Payer: Medicare HMO | Admitting: Internal Medicine

## 2022-11-26 ENCOUNTER — Other Ambulatory Visit: Payer: Self-pay | Admitting: Nurse Practitioner

## 2022-11-30 ENCOUNTER — Encounter: Payer: Self-pay | Admitting: Internal Medicine

## 2022-11-30 ENCOUNTER — Ambulatory Visit (INDEPENDENT_AMBULATORY_CARE_PROVIDER_SITE_OTHER): Payer: Medicare HMO | Admitting: Internal Medicine

## 2022-11-30 VITALS — BP 120/62 | HR 56 | Temp 97.5°F | Resp 16 | Ht 69.0 in | Wt 250.8 lb

## 2022-11-30 DIAGNOSIS — E1122 Type 2 diabetes mellitus with diabetic chronic kidney disease: Secondary | ICD-10-CM | POA: Diagnosis not present

## 2022-11-30 DIAGNOSIS — J302 Other seasonal allergic rhinitis: Secondary | ICD-10-CM

## 2022-11-30 DIAGNOSIS — Z79899 Other long term (current) drug therapy: Secondary | ICD-10-CM | POA: Diagnosis not present

## 2022-11-30 DIAGNOSIS — Z6837 Body mass index (BMI) 37.0-37.9, adult: Secondary | ICD-10-CM | POA: Diagnosis not present

## 2022-11-30 DIAGNOSIS — N182 Chronic kidney disease, stage 2 (mild): Secondary | ICD-10-CM | POA: Diagnosis not present

## 2022-11-30 DIAGNOSIS — J300 Vasomotor rhinitis: Secondary | ICD-10-CM

## 2022-11-30 MED ORDER — TIRZEPATIDE 5 MG/0.5ML ~~LOC~~ SOAJ: SUBCUTANEOUS | 0 refills | Status: DC

## 2022-11-30 MED ORDER — IPRATROPIUM BROMIDE 0.06 % NA SOLN: NASAL | 3 refills | Status: DC

## 2022-11-30 NOTE — Patient Instructions (Signed)

## 2022-11-30 NOTE — Progress Notes (Signed)
Future Appointments  Date Time Provider Department  12/07/2022  8:30 AM Garnet Sierras, DO [facility]  02/08/2023  9:30 AM Alycia Rossetti, NP [facility]  05/10/2023 11:00 AM Unk Pinto, MD [id number]    History of Present Illness:     Patient is a very nice 67 yo MWM with HTN, HLD, T2_DM,  Aortic Atherosclerosis and Vitamin D Deficiency presents with c/o ? Possible sinus infection. Apparently his dentist obtained Dental X-rays suspect for a large inclusion cyst in the Left Maxillary sinus.        Patient also has an appt to see Dr supple tomorrow re: Rt shoulder pains          Patient has been on low dose Mounjaro about 2 months and has not lost any weight .  Wt Readings from Last 3 Encounters:  11/30/22 250 lb 12.8 oz (113.8 kg)  10/26/22 251 lb 9.6 oz (114.1 kg)  09/02/22 249 lb 14.4 oz (113.4 kg)     Current Outpatient Medications on File Prior to Visit  Medication Sig   albuterol (VENTOLIN HFA) 108 (90 Base) MCG/ACT inhaler USE 2 INHALATIONS 15-20 MINUTES APART EVERY 4 HOURS AS NEEDED TO RESCUE ASTHMA   Albuterol-Budesonide (AIRSUPRA) 90-80  Inhale 2 puffs into the lungs every 4 hours as needed (   VITAMIN C 1000 MG tablet Take 1daily.   bisoprolol  5 MG tablet Take 1 tablet daily.   cetirizine 10 MG tablet Take 1 tablet  daily.   VITAMIN D 125 MCG 5000 u Take by mouth.   EPIPEN 2-PAK  0.3 mg/0.3 mL injection Inject 0.3 mg into the muscle as needed for anaphylaxis.   escitalopram (LEXAPRO) 10 MG tablet Take 1 tablet Daily for Mood   fenofibrate micronized (LOFIBRA) 134 MG capsule TAKE 1 CAPSULE DAILY FOR TRIGLYCERIDES   FLONASE nasal spray Place 1 sprayboth nostrils  times daily as needed (nasal congestion).   BREO ELLIPTA) 200-25 MCG/ACT AEPB Inhale 1 puff into the lungs daily. Rinse mouth after each use.   ipratropium (ATROVENT) 0.06 % nasal spray Use 1 to 2 sprays each nostril 2 to 3 x /day as needed   meloxicam (MOBIC) 15 MG tablet TAKE 1/2 TO 1  TABLET BDAILY     montelukast (SINGULAIR) 10 MG tablet Take  1 tablet  Daily    omeprazole (PRILOSEC) 20 MG capsule TAKE 1 CAPSULE BY MOUTH EVERY DAY   phentermine (ADIPEX-P) 37.5 MG tablet TAKE 1 TABLET EVERY MORNING    sildenafil (VIAGRA) 100 MG tablet Take   1/2 to 1 tablet   Daily  as needed    tamsulosin (FLOMAX) 0.4 MG CAPS capsule TAKE 1 CAPSULE  AT BEDTIME FOR PROSTATE   tirzepatide (MOUNJARO) 2.5 MG/0.5ML Pen Inject 1 pen (2.5 mg) into skin every 7 days for Diabetes  (Dx:  e11.29)   tiZANidine  4 MG tablet Take 1 tablet (4 mg total) by mouth every 6 (six) hours as needed for muscle spasms.   valsartan-hctz  80-12.5 MG tablet Take 1 tablet daily   Zinc 50 MG TABS Take by mouth.     Allergies  Allergen Reactions   Bee Venom Anaphylaxis   Pine Anaphylaxis   [place] Nut (Berthollefia [place]) Skin Test     pruritus   Lisinopril Other (See Comments)    Patient intolerant of drug which caused his tongue to tingle.   Penicillins Itching   Celebrex [Celecoxib]      Problem list He has Essential  hypertension; Cluster headaches; OSA treated with BiPAP; Vitamin D deficiency; Testosterone deficiency; Morbid obesity ([place]); Hyperlipidemia, mixed; Trigger finger, right ring finger; Depression; Chronic pain of both shoulders; Insomnia; Gastroesophageal reflux disease; Fatty liver; Benign prostatic hyperplasia (BPH) with post-void dribbling; Thoracic aortic atherosclerosis ([place]) by Chest CT scan on10/21/2020.; Insulin resistance; Seasonal and perennial allergic rhinitis; Anaphylactic reaction due to food, subsequent encounter; Hymenoptera allergy; Moderate persistent asthma without complication; Recurrent infections; Moderate persistent asthma with (acute) exacerbation; and Penicillin allergy on their problem list.   Observations/Objective:  BP 120/62   Pulse (!) 56   Temp (!) 97.5 F (36.4 C)   Resp 16   Ht 5' 9"$  (1.753 m)   Wt 250 lb 12.8 oz (113.8 kg)   SpO2 96%   BMI 37.04 kg/m   HEENT -  WNL. Neck - supple.  Chest - Clear equal BS. Cor - Nl HS. RRR w/o sig MGR. PP 1(+). No edema. MS- FROM w/o deformities.  Gait Nl. Neuro -  Nl w/o focal abnormalities.   Assessment and Plan:  1. Type 2 diabetes mellitus with stage 2 chronic kidney disease, without long-term current use of insulin (HCC)  - tirzepatide (MOUNJARO) 5 MG/0.5ML Pen; Inject 1 pen  ( 5 mg) into Skin every 7 days for Diabetes   (Dx: e11.29)  Dispense: 2 mL; Refill: 0  2. Class 2 severe obesity due to excess calories with serious comorbidity and body mass index (BMI) of 37.0 to 37.9 in adult (HCC)  - tirzepatide (MOUNJARO) 5 MG/0.5ML Pen;    Inject 1 pen  ( 5 mg) into Skin every 7 days f Dispense: 2 mL; Refill: 0   3. Vasomotor rhinitis   4. Medication management  - Zinc 50 MG TABS; Take by mouth. -VITAMIN C) 1000 MG tablet; Take daily.  5. Seasonal allergies  - ipratropium (ATROVENT) 0.06 % nasal spray; Use 1 to 2 sprays each nostril 2 to 3 x /day as needed  Dispense: 45 mL; Refill: 3   Follow Up Instructions:        I discussed the assessment and treatment plan with the patient. The patient was provided an opportunity to ask questions and all were answered. The patient agreed with the plan and demonstrated an understanding of the instructions.       The patient was advised to call back or seek an in-person evaluation if the symptoms worsen or if the condition fails to improve as anticipated.    Kirtland Bouchard, MD

## 2022-12-01 DIAGNOSIS — M542 Cervicalgia: Secondary | ICD-10-CM | POA: Diagnosis not present

## 2022-12-01 DIAGNOSIS — M25511 Pain in right shoulder: Secondary | ICD-10-CM | POA: Diagnosis not present

## 2022-12-01 DIAGNOSIS — M5412 Radiculopathy, cervical region: Secondary | ICD-10-CM | POA: Diagnosis not present

## 2022-12-03 DIAGNOSIS — J341 Cyst and mucocele of nose and nasal sinus: Secondary | ICD-10-CM | POA: Insufficient documentation

## 2022-12-07 ENCOUNTER — Encounter: Payer: Self-pay | Admitting: Allergy

## 2022-12-07 ENCOUNTER — Ambulatory Visit: Payer: Medicare HMO | Admitting: Allergy

## 2022-12-07 ENCOUNTER — Other Ambulatory Visit: Payer: Self-pay

## 2022-12-07 VITALS — BP 122/62 | HR 81 | Temp 97.2°F | Resp 20 | Ht 69.0 in | Wt 247.0 lb

## 2022-12-07 DIAGNOSIS — Z91038 Other insect allergy status: Secondary | ICD-10-CM

## 2022-12-07 DIAGNOSIS — T7800XD Anaphylactic reaction due to unspecified food, subsequent encounter: Secondary | ICD-10-CM

## 2022-12-07 DIAGNOSIS — B999 Unspecified infectious disease: Secondary | ICD-10-CM

## 2022-12-07 DIAGNOSIS — J302 Other seasonal allergic rhinitis: Secondary | ICD-10-CM | POA: Diagnosis not present

## 2022-12-07 DIAGNOSIS — J4540 Moderate persistent asthma, uncomplicated: Secondary | ICD-10-CM | POA: Diagnosis not present

## 2022-12-07 DIAGNOSIS — Z880 Allergy status to penicillin: Secondary | ICD-10-CM | POA: Diagnosis not present

## 2022-12-07 DIAGNOSIS — J3089 Other allergic rhinitis: Secondary | ICD-10-CM

## 2022-12-07 NOTE — Assessment & Plan Note (Signed)
Consider penicillin allergy skin testing and in office drug challenge in the future.  Over 90% of people with history of penicillin allergy which occurred over 10 years ago are found to be non-allergic.

## 2022-12-07 NOTE — Assessment & Plan Note (Signed)
Past history - Perennial rhinoconjunctivitis symptoms for many years mainly in the spring and fall.  Tried Singulair, Zyrtec and Flonase with some benefit.  No prior ENT evaluation.  2023 CT sinus showed mild mucosal thickening. 2023 skin testing showed: Positive to dust mites, mold and cockroach. 2023 bloodwork positive to dust mites, grass, ragweed.  Borderline to cat, cockroach, tree, weed pollen. Interim history - some symptoms.  Continue environmental control measures as below. Continue Singulair (montelukast) 74m daily at night. Use over the counter antihistamines such as Zyrtec (cetirizine), Claritin (loratadine), Allegra (fexofenadine), or Xyzal (levocetirizine) daily as needed. May take twice a day during allergy flares. May switch antihistamines every few months. Use Flonase (fluticasone) nasal spray 1 spray per nostril twice a day as needed for nasal congestion.  Use azelastine nasal spray 1-2 sprays per nostril twice a day as needed for runny nose/drainage. Nasal saline spray (i.e., Simply Saline) or nasal saline lavage (i.e., NeilMed) is recommended as needed and prior to medicated nasal sprays. If worsening symptoms - will refer to ENT next. Consider adding on environmental injections after finished build up with the venoms.

## 2022-12-07 NOTE — Assessment & Plan Note (Signed)
Past history - Gets wheezing, coughing and shortness of breath mainly during URIs. Uses albuterol rarely but during the last URI needed to add on Trelegy for 2 weeks. 2023 CXR unremarkable. 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 Clinically feeling slightly improved.  Interim history - only has issues with exertion.  Today's spirometry showed some restriction - mainly due to body habitus. Daily controller medication(s): continue Breo 271mg 1 puff once a day and rinse mouth after each use.  May use Airsupra rescue inhaler 2 puffs every 4 to 6 hours as needed for shortness of breath, chest tightness, coughing, and wheezing. Do not use more than 12 puffs in 24 hours. May use Airsupra rescue inhaler 2 puffs 5 to 15 minutes prior to strenuous physical activities. Monitor frequency of use. Rinse mouth after each use.

## 2022-12-07 NOTE — Assessment & Plan Note (Signed)
Past history - Went to ER few times after stings due to hives and swelling. Never required Epi. 2023 bloodwork positive to honeybee, white faced hornet, yellow jacket, wasp, yellow hornet and bumblebee. Interim history - interested in starting VIT.  Continue to avoid. Recommend starting injections for this first - 3 shots. Discussed risks and benefits.  Check with your insurance. Consent form was signed.  For mild symptoms you can take over the counter antihistamines such as Benadryl and monitor symptoms closely. If symptoms worsen or if you have severe symptoms including breathing issues, throat closure, significant swelling, whole body hives, severe diarrhea and vomiting, lightheadedness then inject epinephrine and seek immediate medical care afterwards. Action plan in place.

## 2022-12-07 NOTE — Assessment & Plan Note (Signed)
Past history - Upper body itching after eating mixed nuts in December 2023. Has eaten peanut butter since then with no issues. 2023 skin testing showed: Positive to [place] nuts. 2023 bloodwork positive to [place] nuts, borderline to walnuts, pecan and pistachio.  More likely to have anaphylactic reaction to [place] nuts, walnuts. Continue strict avoidance of tree nuts. For mild symptoms you can take over the counter antihistamines such as Benadryl and monitor symptoms closely. If symptoms worsen or if you have severe symptoms including breathing issues, throat closure, significant swelling, whole body hives, severe diarrhea and vomiting, lightheadedness then inject epinephrine and seek immediate medical care afterwards.

## 2022-12-07 NOTE — Patient Instructions (Addendum)
Breathing Daily controller medication(s): continue Breo 257mg 1 puff once a day and rinse mouth after each use.  May use Airsupra rescue inhaler 2 puffs every 4 to 6 hours as needed for shortness of breath, chest tightness, coughing, and wheezing. Do not use more than 12 puffs in 24 hours. May use Airsupra rescue inhaler 2 puffs 5 to 15 minutes prior to strenuous physical activities. Monitor frequency of use. Rinse mouth after each use.   Breathing control goals:  Full participation in all desired activities (may need albuterol before activity) Albuterol use two times or less a week on average (not counting use with activity) Cough interfering with sleep two times or less a month Oral steroids no more than once a year No hospitalizations  Food 2023 testing: positive to bBolivianuts, borderline to walnuts, pecan and pistachio.  More likely to have anaphylactic reaction to BBolivianuts, walnuts. Continue strict avoidance of tree nuts. Okay with eating peanuts.   Bee stings 2023 bloodwork positive to honeybee, white faced hornet, yellow jacket, wasp, yellow hornet and bumblebee. Continue to avoid. Recommend starting injections for this first - 3 shots Check with your insurance. Consent form was signed.  For mild symptoms you can take over the counter antihistamines such as Benadryl and monitor symptoms closely. If symptoms worsen or if you have severe symptoms including breathing issues, throat closure, significant swelling, whole body hives, severe diarrhea and vomiting, lightheadedness then inject epinephrine and seek immediate medical care afterwards. Action plan in place.   Environmental allergies 2023 skin testing positive to dust mites, mold and cockroach. 2023 bloodwork positive to dust mites, grass, ragweed.  Borderline to cat, cockroach, tree, weed pollen. Continue environmental control measures as below. Continue Singulair (montelukast) 173mdaily at night. Use over the counter  antihistamines such as Zyrtec (cetirizine), Claritin (loratadine), Allegra (fexofenadine), or Xyzal (levocetirizine) daily as needed. May take twice a day during allergy flares. May switch antihistamines every few months. Use Flonase (fluticasone) nasal spray 1 spray per nostril twice a day as needed for nasal congestion.  Use azelastine nasal spray 1-2 sprays per nostril twice a day as needed for runny nose/drainage. Nasal saline spray (i.e., Simply Saline) or nasal saline lavage (i.e., NeilMed) is recommended as needed and prior to medicated nasal sprays.  Infections Keep track of infections and antibiotics use.  Penicillin allergy: Consider penicillin allergy skin testing and in office drug challenge in the future.  Over 90% of people with history of penicillin allergy which occurred over 10 years ago are found to be non-allergic.  You must be off antihistamines for 3-5 days before. Plan on being in the office for 2-3 hours. You must call to schedule an appointment and specify it's for a drug challenge.  A few days prior to the appointment, I will send in a prescription for amoxicillin liquid which you must bring to the appointment as well.   Follow up in 4 months or sooner if needed.

## 2022-12-07 NOTE — Assessment & Plan Note (Signed)
Keep track of infections and antibiotics use.

## 2022-12-07 NOTE — Progress Notes (Signed)
Follow Up Note  RE: George Montgomery MRN: [id number] DOB: 15-Nov-1955 Date of Office Visit: 12/07/2022  Referring provider: Unk Pinto, MD Primary care provider: Unk Pinto, MD  Chief Complaint: Follow-up (Pt states he has been doing better and wants to get appt to start his bee shots.Marland Kitchen)  History of Present Illness: I had the pleasure of seeing George Montgomery for a follow up visit at the [facility] on 12/07/2022. He is a 67 y.o. male, who is being followed for asthma, allergic rhinitis, hymenoptera allergy, food allergy, recurrent infections and penicillin allergy. His previous allergy office visit was on 10/14/2022 with Dr. Maudie Mercury. Today is a regular follow up visit.  Recurrent infections No infections since last visit but apparently dentist told him he noted a mucous retention cyst while doing in office CT. Patient needs implants.  He saw ENT for this and was told no surgery needed.  Discussed to follow up with his dentist regarding this mucous retention cyst.    Moderate persistent asthma Currently on Breo 246mg 1 puff once a day and using albuterol once per week - mainly with exertion.   Denies any ER/urgent care visits or prednisone use since the last visit.  Seasonal and perennial allergic rhinitis Still having issues with dust. Takes Singulair, and not sure which nasal sprays to use.    Hymenoptera allergy No stings with the last visit. Wants to start VIT.  Food allergy  Avoiding tree nuts. No reactions.   Assessment and Plan: TJhovanyis a 67y.o. male with: Hymenoptera allergy Past history - Went to ER few times after stings due to hives and swelling. Never required Epi. 2023 bloodwork positive to honeybee, white faced hornet, yellow jacket, wasp, yellow hornet and bumblebee. Interim history - interested in starting VIT.  Continue to avoid. Recommend starting injections for this first - 3 shots. Discussed risks and benefits.  Check with your  insurance. Consent form was signed.  For mild symptoms you can take over the counter antihistamines such as Benadryl and monitor symptoms closely. If symptoms worsen or if you have severe symptoms including breathing issues, throat closure, significant swelling, whole body hives, severe diarrhea and vomiting, lightheadedness then inject epinephrine and seek immediate medical care afterwards. Action plan in place.   Moderate persistent asthma without complication Past history - Gets wheezing, coughing and shortness of breath mainly during URIs. Uses albuterol rarely but during the last URI needed to add on Trelegy for 2 weeks. 2023 CXR unremarkable. 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 Clinically feeling slightly improved.  Interim history - only has issues with exertion.  Today's spirometry showed some restriction - mainly due to body habitus. Daily controller medication(s): continue Breo 2029m 1 puff once a day and rinse mouth after each use.  May use Airsupra rescue inhaler 2 puffs every 4 to 6 hours as needed for shortness of breath, chest tightness, coughing, and wheezing. Do not use more than 12 puffs in 24 hours. May use Airsupra rescue inhaler 2 puffs 5 to 15 minutes prior to strenuous physical activities. Monitor frequency of use. Rinse mouth after each use.   Seasonal and perennial allergic rhinitis Past history - Perennial rhinoconjunctivitis symptoms for many years mainly in the spring and fall.  Tried Singulair, Zyrtec and Flonase with some benefit.  No prior ENT evaluation.  2023 CT sinus showed mild mucosal thickening. 2023 skin testing showed:  Positive to dust mites, mold and cockroach. 2023 bloodwork positive to dust mites, grass, ragweed.  Borderline to cat, cockroach, tree, weed pollen. Interim history - some symptoms.  Continue environmental control measures as below. Continue  Singulair (montelukast) 80m daily at night. Use over the counter antihistamines such as Zyrtec (cetirizine), Claritin (loratadine), Allegra (fexofenadine), or Xyzal (levocetirizine) daily as needed. May take twice a day during allergy flares. May switch antihistamines every few months. Use Flonase (fluticasone) nasal spray 1 spray per nostril twice a day as needed for nasal congestion.  Use azelastine nasal spray 1-2 sprays per nostril twice a day as needed for runny nose/drainage. Nasal saline spray (i.e., Simply Saline) or nasal saline lavage (i.e., NeilMed) is recommended as needed and prior to medicated nasal sprays. If worsening symptoms - will refer to ENT next. Consider adding on environmental injections after finished build up with the venoms.  Recurrent infections Keep track of infections and antibiotics use.  Penicillin allergy Consider penicillin allergy skin testing and in office drug challenge in the future.  Over 90% of people with history of penicillin allergy which occurred over 10 years ago are found to be non-allergic.   Anaphylactic reaction due to food, subsequent encounter Past history - Upper body itching after eating mixed nuts in December 2023. Has eaten peanut butter since then with no issues. 2023 skin testing showed: Positive to bBolivianuts. 2023 bloodwork positive to bBolivianuts, borderline to walnuts, pecan and pistachio.  More likely to have anaphylactic reaction to BBolivianuts, walnuts. Continue strict avoidance of tree nuts. For mild symptoms you can take over the counter antihistamines such as Benadryl and monitor symptoms closely. If symptoms worsen or if you have severe symptoms including breathing issues, throat closure, significant swelling, whole body hives, severe diarrhea and vomiting, lightheadedness then inject epinephrine and seek immediate medical care afterwards.  Return in about 4 months (around 04/07/2023).  No orders of the defined types were  placed in this encounter.  Lab Orders  No laboratory test(s) ordered today    Diagnostics: Spirometry:  Tracings reviewed. His effort: It was hard to get consistent efforts and there is a question as to whether this reflects a maximal maneuver. FVC: 2.92L FEV1: 2.21L, 68% predicted FEV1/FVC ratio: 76% Interpretation: Spirometry consistent with possible restrictive disease.  Please see scanned spirometry results for details.  Medication List:  Current Outpatient Medications  Medication Sig Dispense Refill   albuterol (VENTOLIN HFA) 108 (90 Base) MCG/ACT inhaler USE 2 INHALATIONS 15-20 MINUTES APART EVERY 4 HOURS AS NEEDED TO RESCUE ASTHMA 48 g 3   Albuterol-Budesonide (AIRSUPRA) 90-80 MCG/ACT AERO Inhale 2 puffs into the lungs every 4 (four) hours as needed (coughing, wheezing, chest tightness). Do not exceed 12 puffs in 24 hours. 10.7 g 2   bisoprolol (ZEBETA) 5 MG tablet Take 1 tablet (5 mg total) by mouth daily. 90 tablet 3   cetirizine (ZYRTEC ALLERGY) 10 MG tablet Take 1 tablet (10 mg total) by mouth daily. 30 tablet 0   Cholecalciferol (VITAMIN D) 125 MCG (5000 UT) CAPS Take by mouth.     EPINEPHrine (EPIPEN 2-PAK) 0.3 mg/0.3 mL IJ SOAJ injection Inject 0.3 mg into the muscle as needed for anaphylaxis. 2 each 2   escitalopram (LEXAPRO) 10 MG tablet Take 1 tablet Daily for Mood 90 tablet 3   fenofibrate micronized (LOFIBRA) 134 MG capsule TAKE 1 CAPSULE BY MOUTH ONCE DAILY FOR TRIGLYCERIDES (BLOOD FATS) 90 capsule 0   fluticasone (FLONASE) 50 MCG/ACT nasal spray Place  1 spray into both nostrils 2 (two) times daily as needed (nasal congestion). 16 g 5   fluticasone furoate-vilanterol (BREO ELLIPTA) 200-25 MCG/ACT AEPB Inhale 1 puff into the lungs daily. Rinse mouth after each use. 60 each 3   ipratropium (ATROVENT) 0.06 % nasal spray Use 1 to 2 sprays each nostril 2 to 3 x /day as needed 45 mL 3   meloxicam (MOBIC) 15 MG tablet TAKE 1/2 TO 1 (ONE-HALF TO ONE) TABLET BY MOUTH ONCE  DAILY WITH FOOD FOR PAIN AND FOR INFLAMMATION 90 tablet 0   omeprazole (PRILOSEC) 20 MG capsule TAKE 1 CAPSULE BY MOUTH EVERY DAY 90 capsule 0   phentermine (ADIPEX-P) 37.5 MG tablet TAKE 1 TABLET EVERY MORNING FOR DIETING & WEIGHT LOSS 30 tablet 0   sildenafil (VIAGRA) 100 MG tablet Take   1/2 to 1 tablet   Daily  as needed for XXXX 30 tablet 1   tamsulosin (FLOMAX) 0.4 MG CAPS capsule TAKE 1 CAPSULE BY MOUTH AT BEDTIME FOR PROSTATE 90 capsule 0   tirzepatide (MOUNJARO) 5 MG/0.5ML Pen Inject 1 pen  ( 5 mg) into Skin every 7 days for Diabetes   (Dx: e11.29) 2 mL 0   tiZANidine (ZANAFLEX) 4 MG tablet Take 1 tablet (4 mg total) by mouth every 6 (six) hours as needed for muscle spasms. 30 tablet 0   valsartan-hydrochlorothiazide (DIOVAN-HCT) 80-12.5 MG tablet Take 1 tablet by mouth daily. for blood pressure 90 tablet 0   Zinc 50 MG TABS Take by mouth.     montelukast (SINGULAIR) 10 MG tablet Take  1 tablet  Daily for Allergies                                       /                                                  TAKE                                    BY                             MOUTH                                            ONCE DAILY 90 tablet 3   predniSONE (DELTASONE) 20 MG tablet Take 2 tablets by mouth daily with breakfast.     No current facility-administered medications for this visit.   Allergies: Allergies  Allergen Reactions   Bee Venom Anaphylaxis   Pine Anaphylaxis   [place] Nut (Berthollefia [place]) Skin Test     pruritus   Lisinopril Other (See Comments)    Patient intolerant of drug which caused his tongue to tingle.   Penicillins Itching   Celebrex [Celecoxib]    I reviewed his past medical history, social history, family history, and environmental history and no significant changes have been reported from his previous visit.  Review of Systems  Constitutional:  Negative for appetite change, chills,  fever and unexpected weight change.  HENT:  Positive for  congestion, postnasal drip and rhinorrhea.   Eyes:  Negative for itching.  Respiratory:  Negative for chest tightness, shortness of breath and wheezing.   Cardiovascular:  Negative for chest pain.  Gastrointestinal:  Negative for abdominal pain.  Genitourinary:  Negative for difficulty urinating.  Skin:  Negative for rash.  Allergic/Immunologic: Positive for environmental allergies and food allergies.    Objective: BP 122/62   Pulse 81   Temp (!) 97.2 F (36.2 C)   Resp 20   Ht 5' 9"$  (1.753 m)   Wt 247 lb (112 kg)   SpO2 96%   BMI 36.48 kg/m  Body mass index is 36.48 kg/m. Physical Exam Vitals and nursing note reviewed.  Constitutional:      Appearance: Normal appearance. He is well-developed. He is obese.  HENT:     Head: Normocephalic and atraumatic.     Right Ear: Tympanic membrane and external ear normal.     Left Ear: Tympanic membrane and external ear normal.     Nose: Nose normal.     Mouth/Throat:     Mouth: Mucous membranes are moist.     Pharynx: Oropharynx is clear.  Eyes:     Conjunctiva/sclera: Conjunctivae normal.  Cardiovascular:     Rate and Rhythm: Normal rate and regular rhythm.     Heart sounds: Normal heart sounds. No murmur heard. Pulmonary:     Effort: Pulmonary effort is normal.     Breath sounds: Normal breath sounds. No wheezing, rhonchi or rales.  Musculoskeletal:     Cervical back: Neck supple.  Skin:    General: Skin is warm.     Findings: No rash.  Neurological:     Mental Status: He is alert and oriented to person, place, and time.  Psychiatric:        Behavior: Behavior normal.   Previous notes and tests were reviewed. The plan was reviewed with the patient/family, and all questions/concerned were addressed.  It was my pleasure to see Aviraj today and participate in his care. Please feel free to contact me with any questions or concerns.  Sincerely,  Rexene Alberts, DO Allergy & Immunology  [facility] office: [place] office: [phone]

## 2022-12-08 ENCOUNTER — Telehealth: Payer: Self-pay

## 2022-12-08 NOTE — Telephone Encounter (Signed)
Patient called in regards to stating venom injections. Per his insurance he would Pay 20% of the total cost.   Please advise on cost for the venom injections.     To Dr. Maudie Mercury Please advise on what venom injections would be needed to help estimate cost for patient.

## 2022-12-08 NOTE — Telephone Encounter (Signed)
He would get honeybee + mixed vespid + wasp = 5 venoms total.  95149 - venom charge AND 57846 - for the 2 injection charge.

## 2022-12-11 DIAGNOSIS — M5412 Radiculopathy, cervical region: Secondary | ICD-10-CM | POA: Diagnosis not present

## 2022-12-11 DIAGNOSIS — M542 Cervicalgia: Secondary | ICD-10-CM | POA: Insufficient documentation

## 2022-12-14 DIAGNOSIS — C44519 Basal cell carcinoma of skin of other part of trunk: Secondary | ICD-10-CM | POA: Diagnosis not present

## 2022-12-14 DIAGNOSIS — D1801 Hemangioma of skin and subcutaneous tissue: Secondary | ICD-10-CM | POA: Diagnosis not present

## 2022-12-14 DIAGNOSIS — Z85828 Personal history of other malignant neoplasm of skin: Secondary | ICD-10-CM | POA: Diagnosis not present

## 2022-12-14 DIAGNOSIS — D485 Neoplasm of uncertain behavior of skin: Secondary | ICD-10-CM | POA: Diagnosis not present

## 2022-12-14 DIAGNOSIS — L711 Rhinophyma: Secondary | ICD-10-CM | POA: Diagnosis not present

## 2022-12-14 DIAGNOSIS — L812 Freckles: Secondary | ICD-10-CM | POA: Diagnosis not present

## 2022-12-14 DIAGNOSIS — L821 Other seborrheic keratosis: Secondary | ICD-10-CM | POA: Diagnosis not present

## 2022-12-14 DIAGNOSIS — L570 Actinic keratosis: Secondary | ICD-10-CM | POA: Diagnosis not present

## 2022-12-15 DIAGNOSIS — R69 Illness, unspecified: Secondary | ICD-10-CM | POA: Diagnosis not present

## 2022-12-16 ENCOUNTER — Telehealth: Payer: Self-pay | Admitting: Plastic Surgery

## 2022-12-16 NOTE — Telephone Encounter (Signed)
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 His primary doctor recommended him to call us. Please Advise

## 2022-12-16 NOTE — Telephone Encounter (Signed)
I dont

## 2022-12-17 DIAGNOSIS — G4733 Obstructive sleep apnea (adult) (pediatric): Secondary | ICD-10-CM | POA: Diagnosis not present

## 2022-12-23 DIAGNOSIS — M5412 Radiculopathy, cervical region: Secondary | ICD-10-CM | POA: Diagnosis not present

## 2022-12-24 DIAGNOSIS — H35033 Hypertensive retinopathy, bilateral: Secondary | ICD-10-CM | POA: Diagnosis not present

## 2022-12-24 DIAGNOSIS — H524 Presbyopia: Secondary | ICD-10-CM | POA: Diagnosis not present

## 2022-12-28 ENCOUNTER — Other Ambulatory Visit: Payer: Self-pay | Admitting: Internal Medicine

## 2022-12-28 MED ORDER — TIRZEPATIDE 7.5 MG/0.5ML ~~LOC~~ SOAJ: SUBCUTANEOUS | 0 refills | Status: DC

## 2022-12-30 ENCOUNTER — Ambulatory Visit (INDEPENDENT_AMBULATORY_CARE_PROVIDER_SITE_OTHER): Payer: Medicare HMO

## 2022-12-30 ENCOUNTER — Telehealth: Payer: Self-pay | Admitting: Internal Medicine

## 2022-12-30 ENCOUNTER — Other Ambulatory Visit: Payer: Self-pay | Admitting: Internal Medicine

## 2022-12-30 DIAGNOSIS — Z91038 Other insect allergy status: Secondary | ICD-10-CM | POA: Diagnosis not present

## 2022-12-30 DIAGNOSIS — E1122 Type 2 diabetes mellitus with diabetic chronic kidney disease: Secondary | ICD-10-CM

## 2022-12-30 NOTE — Progress Notes (Signed)
Immunotherapy   Patient Details  Name: George Montgomery MRN: [id number] Date of Birth: 1956/05/11  12/30/2022  [name] started injections for MV,HB and Wasp. Patient received 0.05 ml of MV-0.003 mcg/ml, HB-0.001 mcg/ml and Wasp-0.001 mcg/ml. Patient waited 30 minutes with no problems.  Frequency:1 time per week Epi-Pen:Epi-Pen Available  Consent signed and patient instructions given.   [name] 12/30/2022, 9:43 AM

## 2022-12-30 NOTE — Progress Notes (Signed)
  Chronic Care Management   Outreach Note  12/30/2022 Name: George Montgomery MRN: [id number] DOB: 1955/10/28  Referred by: Unk Pinto, MD Reason for referral : No chief complaint on file.   An unsuccessful telephone outreach was attempted today. The patient was referred to the pharmacist for assistance with care management and care coordination.   Follow Up Plan:   .SIG

## 2022-12-31 DIAGNOSIS — M5412 Radiculopathy, cervical region: Secondary | ICD-10-CM | POA: Diagnosis not present

## 2022-12-31 DIAGNOSIS — M542 Cervicalgia: Secondary | ICD-10-CM | POA: Diagnosis not present

## 2023-01-01 DIAGNOSIS — M19041 Primary osteoarthritis, right hand: Secondary | ICD-10-CM | POA: Diagnosis not present

## 2023-01-04 DIAGNOSIS — M542 Cervicalgia: Secondary | ICD-10-CM | POA: Diagnosis not present

## 2023-01-06 ENCOUNTER — Ambulatory Visit (INDEPENDENT_AMBULATORY_CARE_PROVIDER_SITE_OTHER): Payer: Medicare HMO

## 2023-01-06 DIAGNOSIS — Z91038 Other insect allergy status: Secondary | ICD-10-CM

## 2023-01-06 DIAGNOSIS — Z0100 Encounter for examination of eyes and vision without abnormal findings: Secondary | ICD-10-CM | POA: Diagnosis not present

## 2023-01-08 DIAGNOSIS — M542 Cervicalgia: Secondary | ICD-10-CM | POA: Diagnosis not present

## 2023-01-12 DIAGNOSIS — R69 Illness, unspecified: Secondary | ICD-10-CM | POA: Diagnosis not present

## 2023-01-12 DIAGNOSIS — M542 Cervicalgia: Secondary | ICD-10-CM | POA: Diagnosis not present

## 2023-01-13 ENCOUNTER — Ambulatory Visit (INDEPENDENT_AMBULATORY_CARE_PROVIDER_SITE_OTHER): Payer: Medicare HMO

## 2023-01-13 DIAGNOSIS — Z91038 Other insect allergy status: Secondary | ICD-10-CM

## 2023-01-13 DIAGNOSIS — T7800XD Anaphylactic reaction due to unspecified food, subsequent encounter: Secondary | ICD-10-CM

## 2023-01-15 DIAGNOSIS — G4733 Obstructive sleep apnea (adult) (pediatric): Secondary | ICD-10-CM | POA: Diagnosis not present

## 2023-01-20 ENCOUNTER — Ambulatory Visit (INDEPENDENT_AMBULATORY_CARE_PROVIDER_SITE_OTHER): Payer: Medicare HMO

## 2023-01-20 DIAGNOSIS — Z91038 Other insect allergy status: Secondary | ICD-10-CM | POA: Diagnosis not present

## 2023-01-21 DIAGNOSIS — M542 Cervicalgia: Secondary | ICD-10-CM | POA: Diagnosis not present

## 2023-01-23 ENCOUNTER — Other Ambulatory Visit: Payer: Self-pay | Admitting: Internal Medicine

## 2023-01-23 DIAGNOSIS — E1122 Type 2 diabetes mellitus with diabetic chronic kidney disease: Secondary | ICD-10-CM

## 2023-02-02 ENCOUNTER — Encounter: Payer: Self-pay | Admitting: Plastic Surgery

## 2023-02-02 ENCOUNTER — Ambulatory Visit: Payer: Medicare HMO | Admitting: Plastic Surgery

## 2023-02-02 ENCOUNTER — Ambulatory Visit (INDEPENDENT_AMBULATORY_CARE_PROVIDER_SITE_OTHER): Payer: Medicare HMO

## 2023-02-02 VITALS — BP 146/69 | HR 63 | Ht 69.0 in | Wt 243.2 lb

## 2023-02-02 DIAGNOSIS — L711 Rhinophyma: Secondary | ICD-10-CM | POA: Insufficient documentation

## 2023-02-02 DIAGNOSIS — Z91038 Other insect allergy status: Secondary | ICD-10-CM

## 2023-02-02 NOTE — Progress Notes (Signed)
Patient ID: George Montgomery, male    DOB: 1955/11/06, 67 y.o.   MRN: [id number]   Chief Complaint  Patient presents with   Advice Only   Skin Problem    Patient is a 67 old male here with his wife for evaluation of his nose.  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  It quit smoking over 9 years ago.  He has asthma, hypertension and hyperlipidemia.  He has had shoulder surgery and his appendix removed.  He is otherwise in good health.  His nose is thickened and has a blue hue more than a red hue to it.    Review of Systems  Constitutional: Negative.   HENT: Negative.    Eyes: Negative.   Respiratory: Negative.  Negative for chest tightness and shortness of breath.   Cardiovascular: Negative.   Gastrointestinal: Negative.   Endocrine: Negative.   Genitourinary: Negative.   Musculoskeletal: Negative.     Past Medical History:  Diagnosis Date   Arthritis    Asthma    Back pain    Chronic pain of both shoulders    Depression    Dyspnea    Elevated glucose    Environmental allergies    GERD (gastroesophageal reflux disease)    HLD (hyperlipidemia)    HTN (hypertension)    Insomnia    Obesity    OSA on CPAP 08/01/2014   Reflux    Trigger finger     Past Surgical History:  Procedure Laterality Date   APPENDECTOMY  07/2012   bone spur removal     HEMORRHOID SURGERY  09/2012   HERNIA REPAIR        Current Outpatient Medications:    albuterol (VENTOLIN HFA) 108 (90 Base) MCG/ACT inhaler, USE 2 INHALATIONS 15-20 MINUTES APART EVERY 4 HOURS AS NEEDED TO RESCUE ASTHMA, Disp: 48 g, Rfl: 3   Albuterol-Budesonide (AIRSUPRA) 90-80 MCG/ACT AERO, Inhale 2 puffs into the lungs every 4 (four) hours as needed (coughing, wheezing, chest tightness). Do not exceed 12 puffs in 24 hours., Disp: 10.7 g, Rfl: 2   bisoprolol (ZEBETA) 10 MG tablet, Take 10 mg by mouth daily., Disp: , Rfl:     cetirizine (ZYRTEC ALLERGY) 10 MG tablet, Take 1 tablet (10 mg total) by mouth daily., Disp: 30 tablet, Rfl: 0   Cholecalciferol (VITAMIN D) 125 MCG (5000 UT) CAPS, Take by mouth., Disp: , Rfl:    EPINEPHrine (EPIPEN 2-PAK) 0.3 mg/0.3 mL IJ SOAJ injection, Inject 0.3 mg into the muscle as needed for anaphylaxis., Disp: 2 each, Rfl: 2   escitalopram (LEXAPRO) 10 MG tablet, Take 1 tablet Daily for Mood, Disp: 90 tablet, Rfl: 3   fenofibrate micronized (LOFIBRA) 134 MG capsule, TAKE 1 CAPSULE BY MOUTH ONCE DAILY FOR TRIGLYCERIDES (BLOOD FATS), Disp: 90 capsule, Rfl: 0   fluticasone (FLONASE) 50 MCG/ACT nasal spray, Place 1 spray into both nostrils 2 (two) times daily as needed (nasal congestion)., Disp: 16 g, Rfl: 5   fluticasone furoate-vilanterol (BREO ELLIPTA) 200-25 MCG/ACT AEPB, Inhale 1 puff into the lungs daily. Rinse mouth after each use., Disp: 60 each, Rfl: 3   ipratropium (ATROVENT) 0.06 % nasal spray, Use 1 to 2 sprays each nostril 2 to 3 x /day as needed, Disp: 45 mL, Rfl: 3   meloxicam (MOBIC) 15 MG tablet, TAKE 1/2 TO 1 (ONE-HALF TO ONE) TABLET  BY MOUTH ONCE DAILY WITH FOOD FOR PAIN AND FOR INFLAMMATION, Disp: 90 tablet, Rfl: 0   montelukast (SINGULAIR) 10 MG tablet, Take  1 tablet  Daily for Allergies                                       /                                                  TAKE                                    BY                             MOUTH                                            ONCE DAILY, Disp: 90 tablet, Rfl: 3   omeprazole (PRILOSEC) 20 MG capsule, TAKE 1 CAPSULE BY MOUTH EVERY DAY, Disp: 90 capsule, Rfl: 0   phentermine (ADIPEX-P) 37.5 MG tablet, TAKE 1 TABLET EVERY MORNING FOR DIETING & WEIGHT LOSS, Disp: 30 tablet, Rfl: 0   sildenafil (VIAGRA) 100 MG tablet, Take   1/2 to 1 tablet   Daily  as needed for XXXX, Disp: 30 tablet, Rfl: 1   tamsulosin (FLOMAX) 0.4 MG CAPS capsule, TAKE 1 CAPSULE BY MOUTH AT BEDTIME FOR PROSTATE, Disp: 90 capsule, Rfl: 0    tirzepatide (MOUNJARO) 7.5 MG/0.5ML Pen, Inject 1 pen (7.5 mg)  into Skin every 7 days for Diabetes  (Dx - e11.29), Disp: 2 mL, Rfl: 0   valsartan-hydrochlorothiazide (DIOVAN-HCT) 80-12.5 MG tablet, Take 1 tablet by mouth daily. for blood pressure, Disp: 90 tablet, Rfl: 0   Zinc 50 MG TABS, Take by mouth., Disp: , Rfl:    Objective:   Vitals:   02/02/23 0919  BP: (!) 146/69  Pulse: 63  SpO2: 96%    Physical Exam Vitals and nursing note reviewed.  Constitutional:      Appearance: Normal appearance.  HENT:     Head: Normocephalic and atraumatic.  Cardiovascular:     Rate and Rhythm: Normal rate.     Pulses: Normal pulses.  Pulmonary:     Effort: Pulmonary effort is normal.  Abdominal:     Palpations: Abdomen is soft.  Musculoskeletal:        General: Deformity present. No swelling or tenderness.  Skin:    General: Skin is warm.     Capillary Refill: Capillary refill takes less than 2 seconds.     Coloration: Skin is not jaundiced.     Findings: No lesion.  Neurological:     Mental Status: He is alert and oriented to person, place, and time.  Psychiatric:        Mood and Affect: Mood normal.        Behavior: Behavior normal.        Thought Content: Thought content normal.        Judgment: Judgment normal.  Assessment & Plan:  Rhinophyma  The patient is a good candidate for the BBL and then the TR L laser.  I am going to send him a quote and the information for him to look over.  The TR L will have probably a 2 to 3-week downtime.  Pictures were obtained of the patient and placed in the chart with the patient's or guardian's permission.   Alena Bills Jaelee Laughter, DO

## 2023-02-04 NOTE — Telephone Encounter (Signed)
See message.

## 2023-02-05 ENCOUNTER — Other Ambulatory Visit: Payer: Self-pay | Admitting: Nurse Practitioner

## 2023-02-05 ENCOUNTER — Encounter: Payer: Self-pay | Admitting: *Deleted

## 2023-02-05 NOTE — Progress Notes (Unsigned)
ANNUAL WELLNESS VISIT AND  Assessment:   George Montgomery was seen today for Wellness annual exam and 3 month follow-up. has Essential hypertension; Cluster headaches; OSA treated with BiPAP; Vitamin D deficiency; Testosterone deficiency; Morbid obesity; Hyperlipidemia, mixed; Trigger finger, right ring finger; Depression; Chronic pain of both shoulders; Insomnia; Gastroesophageal reflux disease; Fatty liver; Benign prostatic hyperplasia (BPH) with post-void dribbling; Thoracic aortic atherosclerosis (HCC) by Chest CT scan on10/21/2020.; Insulin resistance; Seasonal and perennial allergic rhinitis; Anaphylactic reaction due to food, subsequent encounter; Hymenoptera allergy; Moderate persistent asthma without complication; Recurrent infections; Penicillin allergy; and Rhinophyma on their problem list.   Diagnoses and all orders for this visit:  Encounter for Medicare Annual Wellness Visit - Due Annually  Essential hypertension       Decrease bisoprolol to 5 mg QD and monitor BP - Blood pressure has been running too low on Bisoprolol 10 mg and has been getting dizzy Monitor blood pressure at home; call if consistently over 130/80 Continue DASH diet.   Reminder to go to the ER if any CP, SOB, nausea, dizziness, severe HA, changes vision/speech, left arm numbness and tingling and jaw pain. -     CBC with Differential/Platelet -     COMPLETE METABOLIC PANEL WITH GFR  Hyperlipidemia, mixed Continue medications: fenofibrate  Discussed dietary and exercise modifications Low fat diet -     Lipid panel  Recurrent Major depressive disorder in remission Continue Escitalopram diet and exercise  Distasis recti and umbilical hernia Refer to general surgery for further evaluation and treatment  Thoracic aortic atherosclerosis By Chest CT 2020(HCC) Monitor BP, Weight , cholesterol and blood sugars.   Chronic Bronchitis Uses Breo inhaler with Albuterol as needed  Vitamin D deficiency Continue  supplementation to maintain goal of 70-100 Taking Vitamin D IU daily  Testosterone deficiency No supplementation at this time.  Fatty liver Discussed dietary and exercise modifications  Seasonal allergic rhinitis due to pollen Continue fexofenadine  Medication management Magnesium  BPH Continue Flomax, cut off water after 7  Morbid Obesity (BMI 36+comorbidity) Fair life protein shakes Eat more frequently - try not to go more than 6 hours without protein Aim for 90 grams of protein a day- 30 breakfast/30 lunch 30 dinner Try to keep net carbs less than 50 Net Carbs=Total Carbs-fiber- sugar alcohols Exercise heartrate 120-140(fat burning zone)- walking 20-30 minutes 4 days a week  Discussed dietary and exercise modifications Continue Phentermine  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 Will increase to 10 mg SQ QW Continue diet and exercise - A1c    Over 30 minutes of face to face interview, exam, counseling, chart review, and critical decision making was performed  Future Appointments  Date Time Provider Department Center  02/09/2023 10:00 AM [facility] NURSE [facility] None  04/12/2023 11:00 AM Ellamae Sia, DO [facility] None  05/10/2023 11:00 AM Lucky Cowboy, MD [facility] None  11/02/2023 10:00 AM Raynelle Dick, NP [facility] None    Plan:   During the course of the visit the patient was educated and counseled about appropriate screening and preventive services including:   Pneumococcal vaccine  Prevnar 13 Influenza vaccine Td vaccine Screening electrocardiogram Bone densitometry screening Colorectal cancer screening Diabetes screening Glaucoma screening Nutrition counseling  Advanced directives: requested      Subjective:  George Montgomery is a 67 y.o. male who presents for Medicare Annual Wellness Visit and 3  month follow up for HTN, HLD, BPH,  prediabetes, and vitamin D Def.     BMI is Body mass index is 35.91 kg/m., he has been working on diet and exercise. He has been trying to lose weight. He is a stress eater.  He is trying to eat less simple carbs and has been limiting wine to once a week . He had been riding bike 30 miles/ week- is an e bike. He has tried Phentermine but did not help him lose weight. Currently on Mounjaro 7.5 mg SQ QW but would like to increase dose Wt Readings from Last 3 Encounters:  02/08/23 243 lb 3.2 oz (110.3 kg)  02/02/23 243 lb 3.2 oz (110.3 kg)  12/07/22 247 lb (112 kg)     His blood pressure has been controlled at home , today their BP is BP: 122/60 BP Readings from Last 3 Encounters:  02/08/23 122/60  02/02/23 (!) 146/69  12/07/22 122/62    He does workout. He denies chest pain, shortness of breath, dizziness.  He is on cholesterol medication, fenofibrate, and denies myalgias. His cholesterol is at goal. The cholesterol last visit was:   Lab Results  Component Value Date   CHOL 158 10/26/2022   HDL 38 (L) 10/26/2022   LDLCALC 87 10/26/2022   TRIG 254 (H) 10/26/2022   CHOLHDL 4.2 10/26/2022   He has been working on diet and exercise for prediabetes, he is also on Mounjaro 7.5 mg SQ QW and denies polydipsia, polyuria, visual disturbances, vomiting and weight loss. Last A1C in the office was:  Lab Results  Component Value Date   HGBA1C 6.2 (H) 10/26/2022   Last GFR- needs to drink more water Lab Results  Component Value Date   EGFR 79 10/26/2022   He has distasis rectii with abdomen and umbilical hernia- would like referral, not a lot of pain but is getting larger.  Patient is on Vitamin D supplement.   Lab Results  Component Value Date   VD25OH 54 10/26/2022     BPH Currently he is symptomatic and endorses nocturia x 3-4 a night and denies urinary hesitancy, incomplete voiding, double voiding, weak stream, perineal discomfort, dysuria, hematuria, abdominal pain, flank pain and  testicular pain.  He is taking Flomax.    He is not following with urology at this time and last results were in normal. Lab Results  Component Value Date   PSA 1.32 02/18/2022   Had nerve impingement and was seen by [facility]- her went through PT and symptoms have greatly improved. Previously removed bone spur from right shoulder    Medication Review:  Current Outpatient Medications (Endocrine & Metabolic):    tirzepatide (MOUNJARO) 10 MG/0.5ML Pen, Inject 10 mg into the skin once a week.  Current Outpatient Medications (Cardiovascular):    bisoprolol (ZEBETA) 10 MG tablet, Take 10 mg by mouth daily.   EPINEPHrine (EPIPEN 2-PAK) 0.3 mg/0.3 mL IJ SOAJ injection, Inject 0.3 mg into the muscle as needed for anaphylaxis.   fenofibrate micronized (LOFIBRA) 134 MG capsule, TAKE 1 CAPSULE BY MOUTH ONCE DAILY FOR TRIGLYCERIDES (BLOOD FATS)   sildenafil (VIAGRA) 100 MG tablet, Take   1/2 to 1 tablet   Daily  as needed for XXXX   valsartan-hydrochlorothiazide (DIOVAN-HCT) 80-12.5 MG tablet, Take 1 tablet by mouth daily. for blood pressure  Current Outpatient Medications (Respiratory):    albuterol (VENTOLIN HFA) 108 (90 Base) MCG/ACT inhaler, USE 2 INHALATIONS 15-20 MINUTES APART EVERY 4 HOURS AS NEEDED TO RESCUE ASTHMA  Albuterol-Budesonide (AIRSUPRA) 90-80 MCG/ACT AERO, Inhale 2 puffs into the lungs every 4 (four) hours as needed (coughing, wheezing, chest tightness). Do not exceed 12 puffs in 24 hours.   cetirizine (ZYRTEC ALLERGY) 10 MG tablet, Take 1 tablet (10 mg total) by mouth daily.   fexofenadine (ALLEGRA) 180 MG tablet, Take by mouth.   fluticasone (FLONASE) 50 MCG/ACT nasal spray, Place 1 spray into both nostrils 2 (two) times daily as needed (nasal congestion).   ipratropium (ATROVENT) 0.06 % nasal spray, Use 1 to 2 sprays each nostril 2 to 3 x /day as needed   montelukast (SINGULAIR) 10 MG tablet, Take  1 tablet  Daily for Allergies                                       /                                                   TAKE                                    BY                             MOUTH                                            ONCE DAILY   fluticasone furoate-vilanterol (BREO ELLIPTA) 200-25 MCG/ACT AEPB, Inhale 1 puff into the lungs daily. Rinse mouth after each use.  Current Outpatient Medications (Analgesics):    meloxicam (MOBIC) 15 MG tablet, TAKE 1/2 TO 1 (ONE-HALF TO ONE) TABLET BY MOUTH ONCE DAILY WITH FOOD FOR PAIN AND FOR INFLAMMATION   Current Outpatient Medications (Other):    Cholecalciferol (VITAMIN D) 125 MCG (5000 UT) CAPS, Take by mouth.   escitalopram (LEXAPRO) 10 MG tablet, Take 1 tablet Daily for Mood   omeprazole (PRILOSEC) 20 MG capsule, TAKE 1 CAPSULE BY MOUTH EVERY DAY   phentermine (ADIPEX-P) 37.5 MG tablet, TAKE 1 TABLET EVERY MORNING FOR DIETING & WEIGHT LOSS   tamsulosin (FLOMAX) 0.4 MG CAPS capsule, TAKE 1 CAPSULE BY MOUTH AT BEDTIME FOR PROSTATE   Zinc 50 MG TABS, Take by mouth. (Patient not taking: Reported on 02/08/2023)  Allergies: Allergies  Allergen Reactions   Bee Venom Anaphylaxis   Pine Anaphylaxis   Estonia Nut (Berthollefia Excelsa)     pruritus   Lisinopril Other (See Comments)    Patient intolerant of drug which caused his tongue to tingle.   Penicillins Itching   Celebrex [Celecoxib]     Current Problems (verified) has Essential hypertension; Cluster headaches; OSA treated with BiPAP; Vitamin D deficiency; Testosterone deficiency; Morbid obesity; Hyperlipidemia, mixed; Trigger finger, right ring finger; Depression; Chronic pain of both shoulders; Insomnia; Gastroesophageal reflux disease; Fatty liver; Benign prostatic hyperplasia (BPH) with post-void dribbling; Thoracic aortic atherosclerosis (HCC) by Chest CT scan on10/21/2020.; Insulin resistance; Seasonal and perennial allergic rhinitis; Anaphylactic reaction due to food, subsequent encounter; Hymenoptera allergy; Moderate persistent asthma without  complication; Recurrent infections; Penicillin  allergy; and Rhinophyma on their problem list.  Screening Tests Immunization History  Administered Date(s) Administered   Influenza Inj Mdck Quad With Preservative 08/19/2017, 07/25/2019   Influenza Split 08/06/2015   Influenza, High Dose Seasonal PF 08/07/2020, 07/15/2021, 08/04/2022   Influenza,inj,quad, With Preservative 08/20/2016, 07/20/2019   Moderna Sars-Covid-2 Vaccination 01/04/2020, 01/25/2020   PFIZER(Purple Top)SARS-COV-2 Vaccination 08/27/2020   PNEUMOCOCCAL CONJUGATE-20 10/31/2021   PPD Test 06/03/2018, 11/28/2019   Pneumococcal Polysaccharide-23 08/20/2016   Tdap 11/06/2016   Zoster, Live 11/06/2016    Health Maintenance  Topic Date Due   Lung Cancer Screening  08/08/2020   Diabetic kidney evaluation - Urine ACR  02/19/2023   COVID-19 Vaccine (4 - 2023-24 season) 02/24/2023 (Originally 06/19/2022)   Zoster Vaccines- Shingrix (1 of 2) 05/10/2023 (Originally 01/18/1975)   Hepatitis C Screening  08/12/2023 (Originally 01/18/1974)   COLONOSCOPY (Pts 45-102yrs Insurance coverage will need to be confirmed)  08/11/2025 (Originally 02/13/2020)   INFLUENZA VACCINE  05/20/2023   Diabetic kidney evaluation - eGFR measurement  10/27/2023   Medicare Annual Wellness (AWV)  02/08/2024   DTaP/Tdap/Td (2 - Td or Tdap) 11/06/2026   Pneumonia Vaccine 65+ Years old  Completed   HPV VACCINES  Aged Out     Names of Other Physician/Practitioners you currently use: 1. [facility] here for primary care 2. Eye Exam [facility],12/24/22 3. Dental Exam Dr Azucena Cecil, Peridontal disease Q6month,01/05/23 Patient Care Team: Lucky Cowboy, MD as PCP - General (Internal Medicine) Charlett Nose, [facility] (Inactive) as Pharmacist (Pharmacist)   Surgical: He  has a past surgical history that includes Hemorrhoid surgery (09/2012); Appendectomy (07/2012); Hernia repair; and bone spur removal. Family His family  history includes Alcoholism in his father; Allergic rhinitis in his brother; Anxiety disorder in his mother; Asthma in his brother; Bronchitis in his brother; Cancer - Colon in his paternal uncle and another family member; Cancer - Prostate in his paternal uncle and another family member; Depression in his mother; Heart disease in his mother; Hyperlipidemia in his mother; Hypertension in his mother; Liver disease in his father; Macular degeneration in his mother; Sinusitis in his brother; Stroke in his maternal aunt. Social history  He reports that he quit smoking about 10 years ago. His smoking use included cigarettes. He has a 40.00 pack-year smoking history. He has never used smokeless tobacco. He reports current alcohol use of about 2.0 standard drinks of alcohol per week. He reports that he does not use drugs.  MEDICARE WELLNESS OBJECTIVES: Physical activity: Current Exercise Habits: Home exercise routine, Type of exercise: walking (yard work, bicycling), Time (Minutes): 25, Frequency (Times/Week): 5, Weekly Exercise (Minutes/Week): 125, Intensity: Mild, Exercise limited by: orthopedic condition(s) Cardiac risk factors: Cardiac Risk Factors include: smoking/ tobacco exposure Depression/mood screen:      02/08/2023    9:54 AM  Depression screen PHQ 2/9  Decreased Interest 0  Down, Depressed, Hopeless 0  PHQ - 2 Score 0    ADLs:     02/08/2023    9:55 AM 10/25/2022    7:27 PM  In your present state of health, do you have any difficulty performing the following activities:  Hearing? 0 0  Vision? 0 0  Difficulty concentrating or making decisions? 0 0  Walking or climbing stairs? 0 0  Dressing or bathing? 0 0  Doing errands, shopping? 0 0     Cognitive Testing  Alert? Yes  Normal Appearance?Yes  Oriented to person? Yes  Place? Yes   Time? Yes  Recall of three objects?  Yes  Can perform simple calculations? Yes  Displays appropriate judgment?Yes  Can read the correct time from a watch  face?Yes  EOL planning: Does Patient Have a Medical Advance Directive?: Yes Type of Advance Directive: Living will, Healthcare Power of Attorney Does patient want to make changes to medical advance directive?: No - Patient declined Copy of Healthcare Power of Attorney in Chart?: No - copy requested       Objective:   Today's Vitals   02/08/23 0935  BP: 122/60  Pulse: 60  Temp: (!) 97.2 F (36.2 C)  SpO2: 95%  Weight: 243 lb 3.2 oz (110.3 kg)  Height:  (1.753 m)      Body mass index is 35.91 kg/m.  General appearance: obese pleasant male who is alert, no distress, WD/WN HEENT: normocephalic, sclerae anicteric, TMs pearly, nares patent, no discharge or erythema, pharynx normal Oral cavity: MMM, no lesions Neck: supple, no lymphadenopathy, no thyromegaly, no masses Heart: RRR, normal S1, S2, no murmurs Lungs: CTA bilaterally, no wheezes, rhonchi, or rales Abdomen: +bs, soft, non tender, non distended, no masses, no hepatomegaly, no splenomegaly. diastasis with hernia noted. Musculoskeletal: nontender, no swelling, no obvious deformity Extremities: no edema, no cyanosis, no clubbing. Diabetic foot exam no deformities, PT & DP pulses 2+ bilaterally, intact touch Pulses: 2+ symmetric, upper and lower extremities, normal cap refill Neurological: alert, oriented x 3, CN2-12 intact, strength normal upper extremities and lower extremities, sensation normal throughout, DTRs 2+ throughout, no cerebellar signs, gait normal Psychiatric: normal affect, behavior normal, pleasant     Medicare Attestation I have personally reviewed: The patient's medical and social history Their use of alcohol, tobacco or illicit drugs Their current medications and supplements The patient's functional ability including ADLs,fall risks, home safety risks, cognitive, and hearing and visual impairment Diet and physical activities Evidence for depression or mood disorders  The patient's weight,  height, BMI, and visual acuity have been recorded in the chart.  I have made referrals, counseling, and provided education to the patient based on review of the above and I have provided the patient with a written personalized care plan for preventive services.      Revonda Humphrey ANP-C  [facility]  02/08/2023

## 2023-02-08 ENCOUNTER — Ambulatory Visit (INDEPENDENT_AMBULATORY_CARE_PROVIDER_SITE_OTHER): Payer: Medicare HMO | Admitting: Nurse Practitioner

## 2023-02-08 ENCOUNTER — Encounter: Payer: Self-pay | Admitting: Nurse Practitioner

## 2023-02-08 VITALS — BP 122/60 | HR 60 | Temp 97.2°F | Ht 69.0 in | Wt 243.2 lb

## 2023-02-08 DIAGNOSIS — I10 Essential (primary) hypertension: Secondary | ICD-10-CM | POA: Diagnosis not present

## 2023-02-08 DIAGNOSIS — I700 Atherosclerosis of aorta: Secondary | ICD-10-CM

## 2023-02-08 DIAGNOSIS — R69 Illness, unspecified: Secondary | ICD-10-CM | POA: Diagnosis not present

## 2023-02-08 DIAGNOSIS — K429 Umbilical hernia without obstruction or gangrene: Secondary | ICD-10-CM

## 2023-02-08 DIAGNOSIS — E559 Vitamin D deficiency, unspecified: Secondary | ICD-10-CM | POA: Diagnosis not present

## 2023-02-08 DIAGNOSIS — E349 Endocrine disorder, unspecified: Secondary | ICD-10-CM | POA: Diagnosis not present

## 2023-02-08 DIAGNOSIS — Z0001 Encounter for general adult medical examination with abnormal findings: Secondary | ICD-10-CM

## 2023-02-08 DIAGNOSIS — R6889 Other general symptoms and signs: Secondary | ICD-10-CM | POA: Diagnosis not present

## 2023-02-08 DIAGNOSIS — J42 Unspecified chronic bronchitis: Secondary | ICD-10-CM

## 2023-02-08 DIAGNOSIS — N138 Other obstructive and reflux uropathy: Secondary | ICD-10-CM

## 2023-02-08 DIAGNOSIS — F3340 Major depressive disorder, recurrent, in remission, unspecified: Secondary | ICD-10-CM | POA: Diagnosis not present

## 2023-02-08 DIAGNOSIS — E785 Hyperlipidemia, unspecified: Secondary | ICD-10-CM

## 2023-02-08 DIAGNOSIS — E1122 Type 2 diabetes mellitus with diabetic chronic kidney disease: Secondary | ICD-10-CM | POA: Diagnosis not present

## 2023-02-08 DIAGNOSIS — N182 Chronic kidney disease, stage 2 (mild): Secondary | ICD-10-CM

## 2023-02-08 DIAGNOSIS — K760 Fatty (change of) liver, not elsewhere classified: Secondary | ICD-10-CM | POA: Diagnosis not present

## 2023-02-08 DIAGNOSIS — R7309 Other abnormal glucose: Secondary | ICD-10-CM

## 2023-02-08 DIAGNOSIS — E1169 Type 2 diabetes mellitus with other specified complication: Secondary | ICD-10-CM | POA: Diagnosis not present

## 2023-02-08 DIAGNOSIS — J302 Other seasonal allergic rhinitis: Secondary | ICD-10-CM | POA: Diagnosis not present

## 2023-02-08 DIAGNOSIS — Z0000 Encounter for general adult medical examination without abnormal findings: Secondary | ICD-10-CM

## 2023-02-08 DIAGNOSIS — Z79899 Other long term (current) drug therapy: Secondary | ICD-10-CM

## 2023-02-08 DIAGNOSIS — M6208 Separation of muscle (nontraumatic), other site: Secondary | ICD-10-CM

## 2023-02-08 DIAGNOSIS — N401 Enlarged prostate with lower urinary tract symptoms: Secondary | ICD-10-CM

## 2023-02-08 MED ORDER — FLUTICASONE FUROATE-VILANTEROL 200-25 MCG/ACT IN AEPB: 1.0000 | INHALATION_SPRAY | Freq: Every day | RESPIRATORY_TRACT | 3 refills | Status: DC

## 2023-02-08 MED ORDER — TIRZEPATIDE 10 MG/0.5ML ~~LOC~~ SOAJ
10.0000 mg | SUBCUTANEOUS | 3 refills | Status: DC
Start: 2023-02-08 — End: 2023-04-01

## 2023-02-08 NOTE — Patient Instructions (Signed)

## 2023-02-09 ENCOUNTER — Ambulatory Visit (INDEPENDENT_AMBULATORY_CARE_PROVIDER_SITE_OTHER): Payer: Medicare HMO

## 2023-02-09 ENCOUNTER — Other Ambulatory Visit: Payer: Self-pay | Admitting: Nurse Practitioner

## 2023-02-09 DIAGNOSIS — Z91038 Other insect allergy status: Secondary | ICD-10-CM | POA: Diagnosis not present

## 2023-02-09 DIAGNOSIS — N138 Other obstructive and reflux uropathy: Secondary | ICD-10-CM

## 2023-02-09 DIAGNOSIS — H35033 Hypertensive retinopathy, bilateral: Secondary | ICD-10-CM | POA: Diagnosis not present

## 2023-02-09 LAB — COMPLETE METABOLIC PANEL WITH GFR
AG Ratio: 1.8 (calc) (ref 1.0–2.5)
ALT: 32 U/L (ref 9–46)
AST: 20 U/L (ref 10–35)
Albumin: 4.6 g/dL (ref 3.6–5.1)
Alkaline phosphatase (APISO): 57 U/L (ref 35–144)
BUN: 19 mg/dL (ref 7–25)
CO2: 32 mmol/L (ref 20–32)
Calcium: 9.9 mg/dL (ref 8.6–10.3)
Chloride: 102 mmol/L (ref 98–110)
Creat: 0.98 mg/dL (ref 0.70–1.35)
Globulin: 2.6 g/dL (calc) (ref 1.9–3.7)
Glucose, Bld: 97 mg/dL (ref 65–99)
Potassium: 5 mmol/L (ref 3.5–5.3)
Sodium: 143 mmol/L (ref 135–146)
Total Bilirubin: 0.7 mg/dL (ref 0.2–1.2)
Total Protein: 7.2 g/dL (ref 6.1–8.1)
eGFR: 84 mL/min/{1.73_m2} (ref >=60)

## 2023-02-09 LAB — CBC WITH DIFFERENTIAL/PLATELET
Absolute Monocytes: 616 cells/uL (ref 200–950)
Basophils Absolute: 47 cells/uL (ref 0–200)
Basophils Relative: 0.7 %
Eosinophils Absolute: 161 cells/uL (ref 15–500)
Eosinophils Relative: 2.4 %
HCT: 43.9 % (ref 38.5–50.0)
Hemoglobin: 15.2 g/dL (ref 13.2–17.1)
Lymphs Abs: 1836 cells/uL (ref 850–3900)
MCH: 32.3 pg (ref 27.0–33.0)
MCHC: 34.6 g/dL (ref 32.0–36.0)
MCV: 93.4 fL (ref 80.0–100.0)
MPV: 10.9 fL (ref 7.5–12.5)
Monocytes Relative: 9.2 %
Neutro Abs: 4040 cells/uL (ref 1500–7800)
Neutrophils Relative %: 60.3 %
Platelets: 196 10*3/uL (ref 140–400)
RBC: 4.7 10*6/uL (ref 4.20–5.80)
RDW: 12.2 % (ref 11.0–15.0)
Total Lymphocyte: 27.4 %
WBC: 6.7 10*3/uL (ref 3.8–10.8)

## 2023-02-09 LAB — LIPID PANEL
Cholesterol: 154 mg/dL (ref <200)
HDL: 32 mg/dL — ABNORMAL LOW (ref >=40)
LDL Cholesterol (Calc): 93 mg/dL (calc)
Non-HDL Cholesterol (Calc): 122 mg/dL (calc) (ref <130)
Total CHOL/HDL Ratio: 4.8 (calc) (ref <5.0)
Triglycerides: 193 mg/dL — ABNORMAL HIGH (ref <150)

## 2023-02-09 LAB — HEMOGLOBIN A1C
Hgb A1c MFr Bld: 6 % of total Hgb — ABNORMAL HIGH (ref <5.7)
Mean Plasma Glucose: 126 mg/dL
eAG (mmol/L): 7 mmol/L

## 2023-02-09 LAB — MAGNESIUM: Magnesium: 2.1 mg/dL (ref 1.5–2.5)

## 2023-02-11 ENCOUNTER — Other Ambulatory Visit: Payer: Self-pay | Admitting: Nurse Practitioner

## 2023-02-11 DIAGNOSIS — M6208 Separation of muscle (nontraumatic), other site: Secondary | ICD-10-CM

## 2023-02-16 ENCOUNTER — Ambulatory Visit (INDEPENDENT_AMBULATORY_CARE_PROVIDER_SITE_OTHER): Payer: Medicare HMO

## 2023-02-16 DIAGNOSIS — Z91038 Other insect allergy status: Secondary | ICD-10-CM | POA: Diagnosis not present

## 2023-02-23 ENCOUNTER — Encounter: Payer: Medicare HMO | Admitting: Internal Medicine

## 2023-02-23 ENCOUNTER — Ambulatory Visit (INDEPENDENT_AMBULATORY_CARE_PROVIDER_SITE_OTHER): Payer: Medicare HMO

## 2023-02-23 DIAGNOSIS — Z91038 Other insect allergy status: Secondary | ICD-10-CM

## 2023-02-26 ENCOUNTER — Other Ambulatory Visit: Payer: Self-pay | Admitting: Nurse Practitioner

## 2023-03-01 ENCOUNTER — Ambulatory Visit: Payer: Medicare HMO

## 2023-03-02 ENCOUNTER — Ambulatory Visit (INDEPENDENT_AMBULATORY_CARE_PROVIDER_SITE_OTHER): Payer: Medicare HMO

## 2023-03-02 DIAGNOSIS — Z91038 Other insect allergy status: Secondary | ICD-10-CM | POA: Diagnosis not present

## 2023-03-03 DIAGNOSIS — M6208 Separation of muscle (nontraumatic), other site: Secondary | ICD-10-CM | POA: Diagnosis not present

## 2023-03-03 DIAGNOSIS — K429 Umbilical hernia without obstruction or gangrene: Secondary | ICD-10-CM | POA: Diagnosis not present

## 2023-03-09 ENCOUNTER — Ambulatory Visit (INDEPENDENT_AMBULATORY_CARE_PROVIDER_SITE_OTHER): Payer: Medicare HMO

## 2023-03-09 DIAGNOSIS — Z91038 Other insect allergy status: Secondary | ICD-10-CM | POA: Diagnosis not present

## 2023-03-16 ENCOUNTER — Ambulatory Visit: Payer: Medicare HMO

## 2023-03-18 ENCOUNTER — Encounter: Payer: Self-pay | Admitting: Nurse Practitioner

## 2023-03-18 ENCOUNTER — Ambulatory Visit: Payer: Medicare HMO

## 2023-03-18 ENCOUNTER — Ambulatory Visit (INDEPENDENT_AMBULATORY_CARE_PROVIDER_SITE_OTHER): Payer: Medicare HMO

## 2023-03-18 ENCOUNTER — Ambulatory Visit (INDEPENDENT_AMBULATORY_CARE_PROVIDER_SITE_OTHER): Payer: Medicare HMO | Admitting: Nurse Practitioner

## 2023-03-18 ENCOUNTER — Ambulatory Visit
Admission: RE | Admit: 2023-03-18 | Discharge: 2023-03-18 | Disposition: A | Discharge status: Home / Self Care | Payer: Medicare HMO | Source: Ambulatory Visit | Attending: Nurse Practitioner | Admitting: Nurse Practitioner

## 2023-03-18 VITALS — BP 102/60 | HR 62 | Ht 69.0 in | Wt 232.6 lb

## 2023-03-18 DIAGNOSIS — E1122 Type 2 diabetes mellitus with diabetic chronic kidney disease: Secondary | ICD-10-CM | POA: Diagnosis not present

## 2023-03-18 DIAGNOSIS — R1112 Projectile vomiting: Secondary | ICD-10-CM

## 2023-03-18 DIAGNOSIS — M6208 Separation of muscle (nontraumatic), other site: Secondary | ICD-10-CM

## 2023-03-18 DIAGNOSIS — M5416 Radiculopathy, lumbar region: Secondary | ICD-10-CM | POA: Diagnosis not present

## 2023-03-18 DIAGNOSIS — R142 Eructation: Secondary | ICD-10-CM | POA: Diagnosis not present

## 2023-03-18 DIAGNOSIS — R109 Unspecified abdominal pain: Secondary | ICD-10-CM | POA: Diagnosis not present

## 2023-03-18 DIAGNOSIS — R200 Anesthesia of skin: Secondary | ICD-10-CM

## 2023-03-18 DIAGNOSIS — R143 Flatulence: Secondary | ICD-10-CM

## 2023-03-18 DIAGNOSIS — R1032 Left lower quadrant pain: Secondary | ICD-10-CM

## 2023-03-18 DIAGNOSIS — K429 Umbilical hernia without obstruction or gangrene: Secondary | ICD-10-CM | POA: Diagnosis not present

## 2023-03-18 DIAGNOSIS — Z91038 Other insect allergy status: Secondary | ICD-10-CM | POA: Diagnosis not present

## 2023-03-18 DIAGNOSIS — N182 Chronic kidney disease, stage 2 (mild): Secondary | ICD-10-CM | POA: Diagnosis not present

## 2023-03-18 NOTE — Progress Notes (Signed)
Assessment and Plan:  George Montgomery was seen today for an episodic visit.  Diagnoses and all order for this visit:  LLQ pain Diverticulitis flare versus obstruction secondary to hx of constipation and fecal impaction, umbilical hernia, diastasis  recti. - DG Abd 2 Views; Future  Projectile vomiting with nausea/Flatulence/Belching Improved  - DG Abd 2 Views; Future  Umbilical hernia without obstruction and without gangrene/Diastasis recti Continue f/u with Dr. Sheliah Hatch - last seen 02/2023 Possible surgical intervention 10/2023 Monitor for increase in enlargement, discoloration, N/V, fever, chills.  Contact office if noticed  Type 2 diabetes mellitus with stage 2 chronic kidney disease, without long-term current use of insulin (HCC) Pending outcome of abd x-ray may stop use of Mounjaro.  Left leg numbness Assess for nerve entrapment Continue to monitor  - DG Lumbar Spine Complete; Future   Notify office for further evaluation and treatment, questions or concerns if s/s fail to improve. The risks and benefits of my recommendations, as well as other treatment options were discussed with the patient today. Questions were answered.   Notify office for further evaluation and treatment, questions or concerns if any reported s/s fail to improve.   The patient was advised to call back or seek an in-person evaluation if any symptoms worsen or if the condition fails to improve as anticipated.   Further disposition pending results of labs. Discussed med's effects and SE's.    I discussed the assessment and treatment plan with the patient. The patient was provided an opportunity to ask questions and all were answered. The patient agreed with the plan and demonstrated an understanding of the instructions.  Discussed med's effects and SE's. Screening labs and tests as requested with regular follow-up as recommended.  I provided 25 minutes of face-to-face time during this encounter  including counseling, chart review, and critical decision making was preformed.  Today's Plan of Care is based on a patient-centered health care approach known as shared decision making - the decisions, tests and treatments allow for patient preferences and values to be balanced with clinical evidence.     Future Appointments  Date Time Provider Department Center  03/18/2023  3:00 PM [facility] NURSE [facility] None  05/10/2023 11:00 AM Lucky Cowboy, MD [facility] None  05/12/2023 10:45 AM Ellamae Sia, DO [facility] None  05/25/2023 10:45 AM Dillingham, Alena Bills, DO [facility] None  11/02/2023 10:00 AM Raynelle Dick, NP [facility] None    ------------------------------------------------------------------------------------------------------------------   HPI BP 102/60   Pulse 62   Ht 5\' 9"  (1.753 m)   Wt 232 lb 9.6 oz (105.5 kg)   SpO2 98%   BMI 34.35 kg/m   67 y.o.male presents for evaluation of abdominal pain accompanied by projectile vomiting, foul taste in mouth, increase in flatulence and belching with diarrhea. Onset was 4 days ago.  Pain most notable along left side of abdomen with LLQ most tender. No melena. He has a hx of constipation with impaction.  Currently on Mounjaro 10 mg every 7-10 days.  Most recently evaluated on 02/2023 by Dr. Sheliah Hatch for diastasis recti and umbilical hernia.  No surgical intervention at this time.  Surgical hx includes hernia repair (~1994) and appendectomy.  Last colonoscopy 01/2015, Dr. Loreta Ave.  Recall 5 years has not followed through.  Noted to have diverticula of sigmoid, no hx of diverticulitis. Has some improvement in N/V, diarrhea today.  Continues to have LLQ pain.  Last BM today, diarrhea.   Also noted to have left leg numbness and tingling  concentrated at the left outer thigh that radiates down lateral side of left leg.  Onset approximately 3 months ago.  Skin feels numb to the touch.  No recent injury, fall, trauma.  Family hx of DDD lumbar region.     Past Medical History:  Diagnosis Date   Arthritis    Asthma    Back pain    Chronic pain of both shoulders    Depression    Dyspnea    Elevated glucose    Environmental allergies    GERD (gastroesophageal reflux disease)    HLD (hyperlipidemia)    HTN (hypertension)    Insomnia    Obesity    OSA on CPAP 08/01/2014   Reflux    Trigger finger      Allergies  Allergen Reactions   Bee Venom Anaphylaxis   Pine Anaphylaxis   Estonia Nut (Berthollefia Excelsa)     pruritus   Lisinopril Other (See Comments)    Patient intolerant of drug which caused his tongue to tingle.   Penicillins Itching   Celebrex [Celecoxib]     Current Outpatient Medications on File Prior to Visit  Medication Sig   albuterol (VENTOLIN HFA) 108 (90 Base) MCG/ACT inhaler USE 2 INHALATIONS 15-20 MINUTES APART EVERY 4 HOURS AS NEEDED TO RESCUE ASTHMA   Albuterol-Budesonide (AIRSUPRA) 90-80 MCG/ACT AERO Inhale 2 puffs into the lungs every 4 (four) hours as needed (coughing, wheezing, chest tightness). Do not exceed 12 puffs in 24 hours.   bisoprolol (ZEBETA) 10 MG tablet Take 10 mg by mouth daily.   cetirizine (ZYRTEC ALLERGY) 10 MG tablet Take 1 tablet (10 mg total) by mouth daily.   Cholecalciferol (VITAMIN D) 125 MCG (5000 UT) CAPS Take by mouth.   EPINEPHrine (EPIPEN 2-PAK) 0.3 mg/0.3 mL IJ SOAJ injection Inject 0.3 mg into the muscle as needed for anaphylaxis.   escitalopram (LEXAPRO) 10 MG tablet Take 1 tablet Daily for Mood   fenofibrate micronized (LOFIBRA) 134 MG capsule TAKE 1 CAPSULE BY MOUTH ONCE DAILY FOR TRIGLYCERIDES (BLOOD FATS)   fexofenadine (ALLEGRA) 180 MG tablet Take by mouth.   fluticasone (FLONASE) 50 MCG/ACT nasal spray Place 1 spray into both nostrils 2 (two) times daily as needed (nasal congestion).   fluticasone furoate-vilanterol (BREO ELLIPTA) 200-25 MCG/ACT AEPB Inhale 1 puff into the lungs daily. Rinse mouth after each use.   ipratropium (ATROVENT) 0.06 % nasal spray Use 1 to  2 sprays each nostril 2 to 3 x /day as needed   meloxicam (MOBIC) 15 MG tablet TAKE 1/2 TO 1 (ONE-HALF TO ONE) TABLET BY MOUTH ONCE DAILY WITH FOOD FOR PAIN AND FOR INFLAMMATION   montelukast (SINGULAIR) 10 MG tablet Take  1 tablet  Daily for Allergies                                       /                                                  TAKE                                    BY  MOUTH                                            ONCE DAILY   omeprazole (PRILOSEC) 20 MG capsule TAKE 1 CAPSULE BY MOUTH EVERY DAY   phentermine (ADIPEX-P) 37.5 MG tablet TAKE 1 TABLET EVERY MORNING FOR DIETING & WEIGHT LOSS   sildenafil (VIAGRA) 100 MG tablet Take   1/2 to 1 tablet   Daily  as needed for XXXX   tamsulosin (FLOMAX) 0.4 MG CAPS capsule TAKE 1 CAPSULE BY MOUTH AT BEDTIME FOR PROSTATE   tirzepatide (MOUNJARO) 10 MG/0.5ML Pen Inject 10 mg into the skin once a week.   valsartan-hydrochlorothiazide (DIOVAN-HCT) 80-12.5 MG tablet Take 1 tablet by mouth daily. for blood pressure   Zinc 50 MG TABS Take by mouth.   No current facility-administered medications on file prior to visit.    ROS: all negative except what is noted in the HPI.   Physical Exam:  BP 102/60   Pulse 62   Ht 5\' 9"  (1.753 m)   Wt 232 lb 9.6 oz (105.5 kg)   SpO2 98%   BMI 34.35 kg/m   General Appearance: NAD.  Awake, conversant and cooperative. Eyes: PERRLA, EOMs intact.  Sclera white.  Conjunctiva without erythema. Sinuses: No frontal/maxillary tenderness.  No nasal discharge. Nares patent.  ENT/Mouth: Ext aud canals clear.  Bilateral TMs w/DOL and without erythema or bulging. Hearing intact.  Posterior pharynx without swelling or exudate.  Tonsils without swelling or erythema.  Neck: Supple.  No masses, nodules or thyromegaly. Respiratory: Effort is regular with non-labored breathing. Breath sounds are equal bilaterally without rales, rhonchi, wheezing or stridor.  Cardio: RRR with no MRGs. Brisk  peripheral pulses without edema.  Abdomen: Active BS in all four quadrants. Round.  Soft and non-tender with tenderness to LUQ and LLQ . Musculoskeletal: Full ROM, 5/5 strength, normal ambulation.  No clubbing or cyanosis. Skin: Appropriate color for ethnicity. Warm without rashes, lesions, ecchymosis, ulcers.  Neuro: CN II-XII grossly normal. Normal muscle tone without cerebellar symptoms and intact sensation.   Psych: AO X 3,  appropriate mood and affect, insight and judgment.     Adela Glimpse, NP 11:25 AM [facility]

## 2023-03-18 NOTE — Patient Instructions (Signed)
Bowel Obstruction A bowel obstruction means that something is blocking the small or large bowel. The bowel is also called the intestine. It is the long tube that connects the stomach to the opening of the butt (anus). When something blocks the bowel, food and fluids cannot pass through like normal. This condition needs to be treated. Treatment depends on the cause of the problem and how bad the problem is. What are the causes? Common causes of this condition include: Scar tissue inside the body from past surgery or from high-energy X-rays (radiation). Recent surgery in the belly. This affects how food moves in the bowel. Some diseases, such as: Irritation of the lining of the digestive tract (Crohn's disease). Irritation of small pouches in the bowel (diverticulitis). Growths or tumors. A bulging organ or tissue (hernia). Twisting of the bowel (volvulus). A swallowed object. Slipping of a part of the bowel into another part (intussusception). What are the signs or symptoms? Symptoms of this condition include: Pain in the belly. Feeling like you may vomit (nauseous). Vomiting. Bloating in the belly. Being unable to pass gas. Trouble pooping (constipation). A lot of belching. Watery poop (diarrhea). How is this treated? Treatment for this condition may include: Fluids and pain medicines that are given through an IV tube. Your doctor may tell you not to eat or drink if you feel like you may vomit or are vomiting. Eating a clear liquid diet for a few days. Putting a small tube (nasogastric tube) through the nose, down the throat, and into the stomach. This will help with pain, discomfort, and the feeling like you may vomit. Surgery. This may be needed if other treatments do not work. Follow these instructions at home: Medicines Take over-the-counter and prescription medicines only as told by your doctor. If you were prescribed an antibiotic medicine, take it as told by your doctor. Do  not stop taking the antibiotic even if you start to feel better. General instructions Follow instructions from your doctor about what you can or cannot eat and drink. You may need to: Only drink clear liquids until you start to get better. Avoid solid foods. Return to your normal activities when your doctor says that it is safe. Rest as told by your doctor. Get up to take short walks every 1 to 2 hours. Ask for help if you feel weak or unsteady. Keep all follow-up visits. How is this prevented? After having a bowel obstruction, you may be more likely to have another. You can do some things to stop it from happening again. If you have a long-term (chronic) disease, contact your doctor if you see changes or problems. Avoid having trouble pooping. You may need to take these actions to prevent or treat trouble pooping: Drink enough fluid to keep your pee (urine) pale yellow. Take over-the-counter or prescription medicines. Eat foods that are high in fiber. These include beans, whole grains, and fresh fruits and vegetables. Limit foods that are high in fat and sugar. These include fried or sweet foods. Stay active. Ask your doctor which exercises are safe for you. Avoid stress. Eat three small meals and three small snacks each day. Work with a diet and Hospital doctor (dietitian) to make a meal plan that works for you. Do not smoke or use any products that contain nicotine or tobacco. If you need help quitting, ask your doctor. Contact a doctor if: You have a fever. You have chills. Get help right away if: You have pain or cramps that get  worse. You vomit blood. You feel like you may vomit and the feeling lasts a long time. You cannot stop vomiting. You cannot drink fluids. You feel confused. You feel very thirsty (dehydrated). Your belly gets more bloated. You feel weak or you faint. Summary A bowel obstruction means that something is blocking the small or large bowel. Treatment may  include IV fluids and pain medicine. You may also have a clear liquid diet, a small tube in your stomach, or surgery. Drink clear liquids and avoid solid foods until you get better, as told by your doctor. This information is not intended to replace advice given to you by your health care provider. Make sure you discuss any questions you have with your health care provider. Document Revised: 11/17/2020 Document Reviewed: 11/17/2020 Elsevier Patient Education  2024 [employer].

## 2023-03-22 ENCOUNTER — Other Ambulatory Visit: Payer: Self-pay

## 2023-03-22 ENCOUNTER — Encounter (HOSPITAL_COMMUNITY): Payer: Self-pay

## 2023-03-22 ENCOUNTER — Emergency Department (HOSPITAL_COMMUNITY)
Admission: EM | Admit: 2023-03-22 | Discharge: 2023-03-23 | Disposition: A | Discharge status: Home / Self Care | Payer: Medicare HMO | Attending: Emergency Medicine | Admitting: Emergency Medicine

## 2023-03-22 DIAGNOSIS — E119 Type 2 diabetes mellitus without complications: Secondary | ICD-10-CM | POA: Diagnosis not present

## 2023-03-22 DIAGNOSIS — R197 Diarrhea, unspecified: Secondary | ICD-10-CM | POA: Diagnosis not present

## 2023-03-22 DIAGNOSIS — R1013 Epigastric pain: Secondary | ICD-10-CM | POA: Diagnosis not present

## 2023-03-22 DIAGNOSIS — N401 Enlarged prostate with lower urinary tract symptoms: Secondary | ICD-10-CM | POA: Diagnosis not present

## 2023-03-22 DIAGNOSIS — I10 Essential (primary) hypertension: Secondary | ICD-10-CM | POA: Diagnosis not present

## 2023-03-22 DIAGNOSIS — R1110 Vomiting, unspecified: Secondary | ICD-10-CM | POA: Diagnosis not present

## 2023-03-22 DIAGNOSIS — N400 Enlarged prostate without lower urinary tract symptoms: Secondary | ICD-10-CM | POA: Diagnosis not present

## 2023-03-22 DIAGNOSIS — I959 Hypotension, unspecified: Secondary | ICD-10-CM | POA: Diagnosis not present

## 2023-03-22 DIAGNOSIS — R42 Dizziness and giddiness: Secondary | ICD-10-CM | POA: Diagnosis not present

## 2023-03-22 DIAGNOSIS — Z79899 Other long term (current) drug therapy: Secondary | ICD-10-CM | POA: Diagnosis not present

## 2023-03-22 DIAGNOSIS — D72829 Elevated white blood cell count, unspecified: Secondary | ICD-10-CM | POA: Insufficient documentation

## 2023-03-22 DIAGNOSIS — R112 Nausea with vomiting, unspecified: Secondary | ICD-10-CM | POA: Diagnosis not present

## 2023-03-22 DIAGNOSIS — N200 Calculus of kidney: Secondary | ICD-10-CM | POA: Diagnosis not present

## 2023-03-22 DIAGNOSIS — R10817 Generalized abdominal tenderness: Secondary | ICD-10-CM | POA: Diagnosis not present

## 2023-03-22 NOTE — ED Triage Notes (Signed)
Pt BIB EMS with c/o n/v/d that started a couple of days ago. Pt endorses ABD pain and dizziness. Pt denies fevers.

## 2023-03-23 ENCOUNTER — Emergency Department (HOSPITAL_COMMUNITY): Payer: Medicare HMO

## 2023-03-23 DIAGNOSIS — R1110 Vomiting, unspecified: Secondary | ICD-10-CM | POA: Diagnosis not present

## 2023-03-23 DIAGNOSIS — N200 Calculus of kidney: Secondary | ICD-10-CM | POA: Diagnosis not present

## 2023-03-23 LAB — CBC
HCT: 45.8 % (ref 39.0–52.0)
Hemoglobin: 15.3 g/dL (ref 13.0–17.0)
MCH: 32.4 pg (ref 26.0–34.0)
MCHC: 33.4 g/dL (ref 30.0–36.0)
MCV: 97 fL (ref 80.0–100.0)
Platelets: 218 10*3/uL (ref 150–400)
RBC: 4.72 MIL/uL (ref 4.22–5.81)
RDW: 12.7 % (ref 11.5–15.5)
WBC: 13.9 10*3/uL — ABNORMAL HIGH (ref 4.0–10.5)
nRBC: 0 % (ref 0.0–0.2)

## 2023-03-23 LAB — COMPREHENSIVE METABOLIC PANEL
ALT: 33 U/L (ref 0–44)
AST: 24 U/L (ref 15–41)
Albumin: 4.5 g/dL (ref 3.5–5.0)
Alkaline Phosphatase: 62 U/L (ref 38–126)
Anion gap: 10 (ref 5–15)
BUN: 21 mg/dL (ref 8–23)
CO2: 25 mmol/L (ref 22–32)
Calcium: 9.1 mg/dL (ref 8.9–10.3)
Chloride: 105 mmol/L (ref 98–111)
Creatinine, Ser: 0.98 mg/dL (ref 0.61–1.24)
GFR, Estimated: >60 mL/min (ref >60)
Glucose, Bld: 121 mg/dL — ABNORMAL HIGH (ref 70–99)
Potassium: 3.9 mmol/L (ref 3.5–5.1)
Sodium: 140 mmol/L (ref 135–145)
Total Bilirubin: 0.9 mg/dL (ref 0.3–1.2)
Total Protein: 7.7 g/dL (ref 6.5–8.1)

## 2023-03-23 LAB — URINALYSIS, ROUTINE W REFLEX MICROSCOPIC
Bilirubin Urine: NEGATIVE
Glucose, UA: NEGATIVE mg/dL
Hgb urine dipstick: NEGATIVE
Ketones, ur: NEGATIVE mg/dL
Leukocytes,Ua: NEGATIVE
Nitrite: NEGATIVE
Protein, ur: NEGATIVE mg/dL
Specific Gravity, Urine: 1.031 — ABNORMAL HIGH (ref 1.005–1.030)
pH: 5 (ref 5.0–8.0)

## 2023-03-23 LAB — LIPASE, BLOOD: Lipase: 53 U/L — ABNORMAL HIGH (ref 11–51)

## 2023-03-23 MED ORDER — ONDANSETRON HCL 4 MG PO TABS: 4.0000 mg | ORAL_TABLET | Freq: Four times a day (QID) | ORAL | 0 refills | Status: DC

## 2023-03-23 MED ORDER — IOHEXOL 350 MG/ML SOLN
75.0000 mL | Freq: Once | INTRAVENOUS | Status: AC | PRN
  Administered 2023-03-23: 75 mL via INTRAVENOUS

## 2023-03-23 NOTE — ED Notes (Signed)
Pt unable to void at this time. 

## 2023-03-23 NOTE — Discharge Instructions (Signed)
Please follow-up with your primary care provider regarding recent symptoms and ER visit.  Today your labs and imaging were reassuring however you will need to have your primary care provider check your prostate levels as you are prostate did appear enlarged on the CT scan.  I have also prescribed for you Zofran to take to help with your nausea.  You may take Imodium over-the-counter for your diarrhea.  Please remain hydrated and follow-up with your primary care provider.  Your symptoms are most likely secondary to the increase in [place].  If symptoms worsen or change please return to ER.

## 2023-03-23 NOTE — ED Provider Notes (Signed)
[place] EMERGENCY DEPARTMENT AT [facility] Provider Note   CSN: [id number] Arrival date & time: 03/22/23  2356     History  Chief Complaint  Patient presents with   Emesis   Diarrhea    George Montgomery is a 67 y.o. male history of diabetes, thoracic aortic atherosclerosis presented with nausea vomiting diarrhea for the past week.  Patient has had 2 specific days in which she has had nausea vomiting diarrhea but then is asymptomatic between these episodes.  The first episode was a week ago which resolved and then patient had episode yesterday.  Patient states that he is not noticed any blood in his emesis or stool has not tried any Zofran or Imodium.  Patient states he has been able to eat and drink without issue when he is not experiencing symptoms.  Patient does note that he recently had his Mounjaro increased 2 weeks ago.  Patient denied fevers, sick contacts, recent hospitalizations/surgeries/antibiotic use, travel, funny tasting food, chest pain, shortness of breath  Home Medications Prior to Admission medications   Medication Sig Start Date End Date Taking? Authorizing Provider  ondansetron (ZOFRAN) 4 MG tablet Take 1 tablet (4 mg total) by mouth every 6 (six) hours. 03/23/23  Yes Vada Yellen, Beverly Gust, PA-C  albuterol (VENTOLIN HFA) 108 (90 Base) MCG/ACT inhaler USE 2 INHALATIONS 15-20 MINUTES APART EVERY 4 HOURS AS NEEDED TO RESCUE ASTHMA 10/31/21   Lucky Cowboy, MD  Albuterol-Budesonide (AIRSUPRA) 90-80 MCG/ACT AERO Inhale 2 puffs into the lungs every 4 (four) hours as needed (coughing, wheezing, chest tightness). Do not exceed 12 puffs in 24 hours. 09/28/22   Ellamae Sia, DO  bisoprolol (ZEBETA) 10 MG tablet Take 10 mg by mouth daily. 11/06/22   [provider]  cetirizine (ZYRTEC ALLERGY) 10 MG tablet Take 1 tablet (10 mg total) by mouth daily. 05/18/21   Raspet, Noberto Retort, PA-C  Cholecalciferol (VITAMIN D) 125 MCG (5000 UT) CAPS Take by mouth.    [provider]  EPINEPHrine (EPIPEN 2-PAK) 0.3 mg/0.3 mL IJ SOAJ injection Inject 0.3 mg into the muscle as needed for anaphylaxis. 02/24/22   Ellamae Sia, DO  escitalopram (LEXAPRO) 10 MG tablet Take 1 tablet Daily for Mood 05/05/22   Lucky Cowboy, MD  fenofibrate micronized (LOFIBRA) 134 MG capsule TAKE 1 CAPSULE BY MOUTH ONCE DAILY FOR TRIGLYCERIDES (BLOOD FATS) 02/09/23   Raynelle Dick, NP  fexofenadine (ALLEGRA) 180 MG tablet Take by mouth. 04/10/20   [provider]  fluticasone (FLONASE) 50 MCG/ACT nasal spray Place 1 spray into both nostrils 2 (two) times daily as needed (nasal congestion). 09/28/22   Ellamae Sia, DO  fluticasone furoate-vilanterol (BREO ELLIPTA) 200-25 MCG/ACT AEPB Inhale 1 puff into the lungs daily. Rinse mouth after each use. 02/08/23   Raynelle Dick, NP  ipratropium (ATROVENT) 0.06 % nasal spray Use 1 to 2 sprays each nostril 2 to 3 x /day as needed 11/30/22   Lucky Cowboy, MD  meloxicam (MOBIC) 15 MG tablet TAKE 1/2 TO 1 (ONE-HALF TO ONE) TABLET BY MOUTH ONCE DAILY WITH FOOD FOR PAIN AND FOR INFLAMMATION 09/15/22   Lucky Cowboy, MD  montelukast (SINGULAIR) 10 MG tablet Take  1 tablet  Daily for Allergies                                       /  TAKE                                    BY                             MOUTH                                            ONCE DAILY 07/19/22   Lucky Cowboy, MD  omeprazole (PRILOSEC) 20 MG capsule TAKE 1 CAPSULE BY MOUTH EVERY DAY 02/26/23   Raynelle Dick, NP  phentermine (ADIPEX-P) 37.5 MG tablet TAKE 1 TABLET EVERY MORNING FOR DIETING & WEIGHT LOSS 02/05/23   Raynelle Dick, NP  sildenafil (VIAGRA) 100 MG tablet Take   1/2 to 1 tablet   Daily  as needed for XXXX 11/17/22   Lucky Cowboy, MD  tamsulosin (FLOMAX) 0.4 MG CAPS capsule TAKE 1 CAPSULE BY MOUTH AT BEDTIME FOR PROSTATE 02/09/23   Raynelle Dick, NP  tirzepatide [facility]) 10 MG/0.5ML Pen  Inject 10 mg into the skin once a week. 02/08/23   Raynelle Dick, NP  valsartan-hydrochlorothiazide (DIOVAN-HCT) 80-12.5 MG tablet Take 1 tablet by mouth daily. for blood pressure 09/15/22   Lucky Cowboy, MD  Zinc 50 MG TABS Take by mouth.    [provider]      Allergies    Bee venom, Pine, [place] nut (berthollefia excelsa), Lisinopril, Penicillins, and Celebrex [celecoxib]    Review of Systems   Review of Systems  Gastrointestinal:  Positive for diarrhea and vomiting.    Physical Exam Updated Vital Signs BP 118/68 (BP Location: Right Arm)   Pulse (!) 58   Temp 97.8 F (36.6 C) (Oral)   Resp 18   Ht 5\' 9"  (1.753 m)   Wt 105.2 kg   SpO2 99%   BMI 34.26 kg/m  Physical Exam Vitals reviewed.  Constitutional:      General: He is not in acute distress. HENT:     Head: Normocephalic and atraumatic.  Eyes:     Extraocular Movements: Extraocular movements intact.     Conjunctiva/sclera: Conjunctivae normal.     Pupils: Pupils are equal, round, and reactive to light.  Cardiovascular:     Rate and Rhythm: Normal rate and regular rhythm.     Pulses: Normal pulses.     Heart sounds: Normal heart sounds.     Comments: 2+ bilateral radial/dorsalis pedis pulses with regular rate Pulmonary:     Effort: Pulmonary effort is normal. No respiratory distress.     Breath sounds: Normal breath sounds.  Abdominal:     Palpations: Abdomen is soft.     Tenderness: There is abdominal tenderness (Mild tenderness in epigastric region). There is no guarding or rebound.  Musculoskeletal:        General: Normal range of motion.     Cervical back: Normal range of motion and neck supple.     Comments: 5 out of 5 bilateral grip/leg extension strength  Skin:    General: Skin is warm and dry.     Capillary Refill: Capillary refill takes less than 2 seconds.  Neurological:     General: No focal deficit present.     Mental Status: He is alert and oriented  to person, place, and time.      Comments: Sensation intact in all 4 limbs  Psychiatric:        Mood and Affect: Mood normal.     ED Results / Procedures / Treatments   Labs (all labs ordered are listed, but only abnormal results are displayed) Labs Reviewed  LIPASE, BLOOD - Abnormal; Notable for the following components:      Result Value   Lipase 53 (*)    All other components within normal limits  COMPREHENSIVE METABOLIC PANEL - Abnormal; Notable for the following components:   Glucose, Bld 121 (*)    All other components within normal limits  CBC - Abnormal; Notable for the following components:   WBC 13.9 (*)    All other components within normal limits  URINALYSIS, ROUTINE W REFLEX MICROSCOPIC - Abnormal; Notable for the following components:   Specific Gravity, Urine 1.031 (*)    All other components within normal limits    EKG EKG Interpretation  Date/Time:  Tuesday March 23 2023 00:02:37 EDT Ventricular Rate:  60 PR Interval:  182 QRS Duration: 76 QT Interval:  432 QTC Calculation: 432 R Axis:   57 Text Interpretation: Normal sinus rhythm Normal ECG When compared with ECG of 02-Nov-2022 08:57, PREVIOUS ECG IS PRESENT Confirmed by Vivien Rossetti ([id number]) on 03/23/2023 9:03:40 AM  Radiology CT ABDOMEN PELVIS W CONTRAST  Result Date: 03/23/2023 CLINICAL DATA:  Nausea vomiting and diarrhea EXAM: CT ABDOMEN AND PELVIS WITH CONTRAST TECHNIQUE: Multidetector CT imaging of the abdomen and pelvis was performed using the standard protocol following bolus administration of intravenous contrast. RADIATION DOSE REDUCTION: This exam was performed according to the departmental dose-optimization program which includes automated exposure control, adjustment of the mA and/or kV according to patient size and/or use of iterative reconstruction technique. CONTRAST:  75mL OMNIPAQUE IOHEXOL 350 MG/ML SOLN COMPARISON:  Report ultrasound and noncontrast CT October 2013 FINDINGS: Lower chest: There is some linear opacity  lung bases likely scar or atelectasis. No pleural effusion. Coronary artery calcifications are seen. Prominent epicardial fat. Hepatobiliary: Diffuse fatty liver infiltration. No space-occupying liver lesion. Patent portal vein. Gallbladder is nondilated. Pancreas: Unremarkable. No pancreatic ductal dilatation or surrounding inflammatory changes. Spleen: Normal in size without focal abnormality. Small hilar splenules. Adrenals/Urinary Tract: Adrenal glands are preserved. Punctate upper pole left-sided renal stone. There is focal atrophy of parenchyma in this location. No enhancing renal mass. No collecting system dilatation. Accessory lower pole left-sided renal artery originating from the left common iliac. Preserved contours of the urinary bladder. Stomach/Bowel: No oral contrast. Stomach is underdistended but more fold thickening than expected for level of distention. Please correlate for gastritis or other process. Further workup as clinically appropriate. Scattered colonic stool. Small bowel is nondilated. The appendix is not well seen in the right lower quadrant but no pericecal stranding or fluid. Vascular/Lymphatic: Scattered partially calcified plaque along the abdominal aorta and branch vessels. Normal caliber aorta and IVC. No specific abnormal lymph node enlargement identified in the abdomen and pelvis. Reproductive: Mildly enlarged prostate with mass effect along the base of the bladder. Other: Small fat containing umbilical hernia. Small inguinal hernias as well, right-greater-than-left. No free air or free fluid. Musculoskeletal: Mild degenerative changes along the spine. IMPRESSION: No bowel obstruction, free air or free fluid. Few colonic diverticula. Appendix not well seen in the right lower quadrant but no pericecal stranding or fluid. Mild fold thickening of the stomach. Please correlate with any symptoms and additional evaluation as clinically appropriate.  Fatty liver infiltration. Enlarged  prostate.  Please correlate with patient's PSA. Electronically Signed   By: Karen Kays M.D.   On: 03/23/2023 11:15    Procedures Procedures    Medications Ordered in ED Medications  iohexol (OMNIPAQUE) 350 MG/ML injection 75 mL (75 mLs Intravenous Contrast Given 03/23/23 1046)    ED Course/ Medical Decision Making/ A&P                             Medical Decision Making Amount and/or Complexity of Data Reviewed Labs: ordered. Radiology: ordered.  Risk Prescription drug management.   Ola Spurr 67 y.o. presented today for nausea vomiting diarrhea. Working DDx that I considered at this time includes, but not limited to, symptoms secondary to [facility], gastroenteritis, colitis, small bowel obstruction, appendicitis, cholecystitis, pancreatitis, nephrolithiasis, AAA, UTI, pyelonephritis, testicular torsion.  R/o DDx: gastroenteritis, colitis, small bowel obstruction, appendicitis, cholecystitis, pancreatitis, nephrolithiasis, AAA, UTI, pyelonephritis, testicular torsion: These are considered less likely due to history of present illness and physical exam findings.  Review of prior external notes: 03/18/2023 office visit  Unique Tests and My Interpretation:  CBC with differential: Leukocytosis 13.9 CMP: Unremarkable Lipase: 53 however down from previous reading UA: Negative EKG: Rate, rhythm, axis, intervals all examined and without medically relevant abnormality. ST segments without concerns for elevations CT Abd/Pelvis with contrast: Enlarged prostate, no other acute findings  Discussion with Independent Historian:  Wife  Discussion of Management of Tests: None  Risk: Medium: prescription drug management  Risk Stratification Score: None  Staffed with Elpidio Anis, MD   Plan: Patient presented for nausea vomiting diarrhea. On exam patient was in no acute distress and resting comfortably with stable vitals.  Patient's exam had mild tenderness in his epigastric region  however was otherwise unremarkable.  Patient does note that he recently has had Mounjaro dosage increased which could explain his symptoms however patient does have white count and with his slightly tender epigastric region CT scan was ordered to further evaluate.  Patient stable this time not requiring any medications.  CT was reassuring.  Most likely patient symptoms are secondary to increase and Mounjaro dosage.  Patient did have enlarged prostate on the CT scan and this was relayed to him and that he will need to have his primary care provider checked his PSA levels.  Patient is currently not endorsing any urinary symptoms at this time.  Patient safer discharge home be given Zofran for his nausea and encouraged take Imodium over-the-counter for diarrhea and to remain hydrated and to follow-up with his primary care provider.  Patient was given return precautions. Patient stable for discharge at this time.  Patient verbalized understanding of plan.         Final Clinical Impression(s) / ED Diagnoses Final diagnoses:  Enlarged prostate  Nausea vomiting and diarrhea    Rx / DC Orders ED Discharge Orders          Ordered    ondansetron (ZOFRAN) 4 MG tablet  Every 6 hours        03/23/23 1134              Remi Deter 03/23/23 1230    Mardene Sayer, MD 03/23/23 1756

## 2023-03-24 ENCOUNTER — Other Ambulatory Visit: Payer: Self-pay

## 2023-03-24 ENCOUNTER — Ambulatory Visit: Payer: Medicare HMO

## 2023-03-24 DIAGNOSIS — I10 Essential (primary) hypertension: Secondary | ICD-10-CM

## 2023-03-24 MED ORDER — VALSARTAN-HYDROCHLOROTHIAZIDE 80-12.5 MG PO TABS
1.0000 | ORAL_TABLET | Freq: Every day | ORAL | 0 refills | Status: DC
Start: 2023-03-24 — End: 2023-03-26

## 2023-03-25 ENCOUNTER — Encounter: Payer: Self-pay | Admitting: Nurse Practitioner

## 2023-03-25 ENCOUNTER — Ambulatory Visit (INDEPENDENT_AMBULATORY_CARE_PROVIDER_SITE_OTHER): Payer: Medicare HMO | Admitting: Nurse Practitioner

## 2023-03-25 VITALS — BP 110/68 | HR 54 | Temp 97.5°F | Ht 69.0 in | Wt 233.2 lb

## 2023-03-25 DIAGNOSIS — W57XXXS Bitten or stung by nonvenomous insect and other nonvenomous arthropods, sequela: Secondary | ICD-10-CM

## 2023-03-25 DIAGNOSIS — Z09 Encounter for follow-up examination after completed treatment for conditions other than malignant neoplasm: Secondary | ICD-10-CM

## 2023-03-25 DIAGNOSIS — R1112 Projectile vomiting: Secondary | ICD-10-CM

## 2023-03-25 DIAGNOSIS — T7800XD Anaphylactic reaction due to unspecified food, subsequent encounter: Secondary | ICD-10-CM

## 2023-03-25 NOTE — Progress Notes (Signed)
Hospital follow up  Assessment and Plan: Hospital visit follow up for:   1. Hospital discharge follow-up Reviewed discharge instructions in full including medication changes, diagnostics, labs, and future follow ups appointment. All questions and concerns addressed.   2. Projectile vomiting with nausea Improved Check for meat allergy secondary to past tick bite  - Alpha-Gal Panel  3. Anaphylactic reaction due to food, subsequent encounter Continue to follow with Asthma and Allergy Provider to reach out to office and inform of possible reaction to injection titration versus other allergy trigger.  4. Tick bite, unspecified site, sequela  - Alpha-Gal Panel    All medications were reviewed with patient and family and fully reconciled. All questions answered fully, and patient and family members were encouraged to call the office with any further questions or concerns. Discussed goal to avoid readmission related to this diagnosis.   Over 30 minutes of exam, counseling, chart review, and complex, high/moderate level critical decision making was performed this visit.   Future Appointments  Date Time Provider Department Center  03/29/2023  9:00 AM [facility] NURSE [facility] None  05/10/2023 11:00 AM Lucky Cowboy, MD [facility] None  05/12/2023 10:45 AM Ellamae Sia, DO [facility] None  05/25/2023 10:45 AM Dillingham, Alena Bills, DO [facility] None  11/02/2023 10:00 AM Raynelle Dick, NP [facility] None     HPI 67 y.o.male presents for follow up for transition from recent hospitalization/ER stay. Admit date to the hospital was 03/22/23, patient was discharged from the hospital on 03/23/23 and our clinical staff contacted the office the day after discharge to set up a follow up appointment. The discharge summary, medications, and diagnostic test results were reviewed before meeting with the patient. The patient was admitted for nausea, vomiting and diarrhea.  Patient has had 2 specific days in which  she has had nausea vomiting diarrhea but then is asymptomatic between these episodes. The first episode was a week ago which resolved, was seen by PCP and asked to stop Mounjaro and then patient had episode yesterday. Patient states that he is not noticed any blood in his emesis or stool. Patient states he has been able to eat and drink without issue when he is not experiencing symptoms.   Today during his follow up he states that he is assessing his food intake possibly contributing his symptoms to a food allergy, considering he has not had Mounjaro injection for the last two weeks.  Of note the half life of Greggory Keen is 5 days.  However, he is currently receiving Immunotherapy through [facility] since around 11/2022 being treated for bee venom.  He has been going weekly with last two injection dates being 03/09/2023 and 03/18/23 with doses being increased each time.  Coincidently the two N/V/Diarrhea episodes have occurred about 48-72 hours after receiving injections.  He does note that he will be treated for "meat" allergies soon.  He shares the he drinks a lot of tomato juice and that each time he has had these episodes he has eaten a burger the day or two beforehand.  He does note being bitten by ticks in the past but unsure if the lone start tick b/c he has never checked.    During toda's visit patient feels back to normal.  He denies N/V/D, fever, chills, abdominal pain, belching and flatulence.   Home health is not involved.   Images while in the hospital: CT ABDOMEN PELVIS W CONTRAST  Result Date: 03/23/2023 CLINICAL DATA:  Nausea vomiting and diarrhea EXAM: CT ABDOMEN AND PELVIS WITH CONTRAST TECHNIQUE: Multidetector CT imaging of the abdomen and pelvis was performed using the standard protocol following bolus administration of intravenous contrast. RADIATION DOSE REDUCTION: This exam was performed according to the departmental dose-optimization program which  includes automated exposure control, adjustment of the mA and/or kV according to patient size and/or use of iterative reconstruction technique. CONTRAST:  75mL OMNIPAQUE IOHEXOL 350 MG/ML SOLN COMPARISON:  Report ultrasound and noncontrast CT October 2013 FINDINGS: Lower chest: There is some linear opacity lung bases likely scar or atelectasis. No pleural effusion. Coronary artery calcifications are seen. Prominent epicardial fat. Hepatobiliary: Diffuse fatty liver infiltration. No space-occupying liver lesion. Patent portal vein. Gallbladder is nondilated. Pancreas: Unremarkable. No pancreatic ductal dilatation or surrounding inflammatory changes. Spleen: Normal in size without focal abnormality. Small hilar splenules. Adrenals/Urinary Tract: Adrenal glands are preserved. Punctate upper pole left-sided renal stone. There is focal atrophy of parenchyma in this location. No enhancing renal mass. No collecting system dilatation. Accessory lower pole left-sided renal artery originating from the left common iliac. Preserved contours of the urinary bladder. Stomach/Bowel: No oral contrast. Stomach is underdistended but more fold thickening than expected for level of distention. Please correlate for gastritis or other process. Further workup as clinically appropriate. Scattered colonic stool. Small bowel is nondilated. The appendix is not well seen in the right lower quadrant but no pericecal stranding or fluid. Vascular/Lymphatic: Scattered partially calcified plaque along the abdominal aorta and branch vessels. Normal caliber aorta and IVC. No specific abnormal lymph node enlargement identified in the abdomen and pelvis. Reproductive: Mildly enlarged prostate with mass effect along the base of the bladder. Other: Small fat containing umbilical hernia. Small inguinal hernias as well, right-greater-than-left. No free air or free fluid. Musculoskeletal: Mild degenerative changes along the spine. IMPRESSION: No bowel  obstruction, free air or free fluid. Few colonic diverticula. Appendix not well seen in the right lower quadrant but no pericecal stranding or fluid. Mild fold thickening of the stomach. Please correlate with any symptoms and additional evaluation as clinically appropriate. Fatty liver infiltration. Enlarged prostate.  Please correlate with patient's PSA. Electronically Signed   By: Karen Kays M.D.   On: 03/23/2023 11:15     Current Outpatient Medications (Endocrine & Metabolic):    tirzepatide (MOUNJARO) 10 MG/0.5ML Pen, Inject 10 mg into the skin once a week.  Current Outpatient Medications (Cardiovascular):    bisoprolol (ZEBETA) 10 MG tablet, Take 10 mg by mouth daily.   EPINEPHrine (EPIPEN 2-PAK) 0.3 mg/0.3 mL IJ SOAJ injection, Inject 0.3 mg into the muscle as needed for anaphylaxis.   fenofibrate micronized (LOFIBRA) 134 MG capsule, TAKE 1 CAPSULE BY MOUTH ONCE DAILY FOR TRIGLYCERIDES (BLOOD FATS)   sildenafil (VIAGRA) 100 MG tablet, Take   1/2 to 1 tablet   Daily  as needed for XXXX   valsartan-hydrochlorothiazide (DIOVAN-HCT) 80-12.5 MG tablet, Take 1 tablet by mouth daily. for blood pressure  Current Outpatient Medications (Respiratory):    albuterol (VENTOLIN HFA) 108 (90 Base) MCG/ACT inhaler, USE 2 INHALATIONS 15-20 MINUTES APART EVERY 4 HOURS AS NEEDED TO RESCUE ASTHMA   Albuterol-Budesonide (AIRSUPRA) 90-80 MCG/ACT AERO, Inhale 2 puffs into the lungs every 4 (four) hours as needed (coughing, wheezing, chest tightness). Do not exceed 12 puffs in 24 hours.   cetirizine (ZYRTEC ALLERGY) 10 MG tablet, Take 1 tablet (10 mg total) by mouth daily.   fexofenadine (ALLEGRA) 180 MG tablet, Take by mouth.   fluticasone (FLONASE) 50 MCG/ACT nasal  spray, Place 1 spray into both nostrils 2 (two) times daily as needed (nasal congestion).   fluticasone furoate-vilanterol (BREO ELLIPTA) 200-25 MCG/ACT AEPB, Inhale 1 puff into the lungs daily. Rinse mouth after each use.   ipratropium (ATROVENT)  0.06 % nasal spray, Use 1 to 2 sprays each nostril 2 to 3 x /day as needed   montelukast (SINGULAIR) 10 MG tablet, Take  1 tablet  Daily for Allergies                                       /                                                  TAKE                                    BY                             MOUTH                                            ONCE DAILY  Current Outpatient Medications (Analgesics):    meloxicam (MOBIC) 15 MG tablet, TAKE 1/2 TO 1 (ONE-HALF TO ONE) TABLET BY MOUTH ONCE DAILY WITH FOOD FOR PAIN AND FOR INFLAMMATION   Current Outpatient Medications (Other):    Cholecalciferol (VITAMIN D) 125 MCG (5000 UT) CAPS, Take by mouth.   escitalopram (LEXAPRO) 10 MG tablet, Take 1 tablet Daily for Mood   omeprazole (PRILOSEC) 20 MG capsule, TAKE 1 CAPSULE BY MOUTH EVERY DAY   ondansetron (ZOFRAN) 4 MG tablet, Take 1 tablet (4 mg total) by mouth every 6 (six) hours.   phentermine (ADIPEX-P) 37.5 MG tablet, TAKE 1 TABLET EVERY MORNING FOR DIETING & WEIGHT LOSS   tamsulosin (FLOMAX) 0.4 MG CAPS capsule, TAKE 1 CAPSULE BY MOUTH AT BEDTIME FOR PROSTATE   Zinc 50 MG TABS, Take by mouth.  Past Medical History:  Diagnosis Date   Arthritis    Asthma    Back pain    Chronic pain of both shoulders    Depression    Dyspnea    Elevated glucose    Environmental allergies    GERD (gastroesophageal reflux disease)    HLD (hyperlipidemia)    HTN (hypertension)    Insomnia    Obesity    OSA on CPAP 08/01/2014   Reflux    Trigger finger      Allergies  Allergen Reactions   Bee Venom Anaphylaxis   Pine Anaphylaxis   Estonia Nut (Berthollefia Excelsa)     pruritus   Lisinopril Other (See Comments)    Patient intolerant of drug which caused his tongue to tingle.   Penicillins Itching   Celebrex [Celecoxib]     ROS: all negative except above.   Physical Exam: Filed Weights   03/25/23 1140  Weight: 233 lb 3.2 oz (105.8 kg)   BP 110/68   Pulse (!) 54   Temp (!) 97.5  F (36.4 C)  Ht 5\' 9"  (1.753 m)   Wt 233 lb 3.2 oz (105.8 kg)   SpO2 98%   BMI 34.44 kg/m  General Appearance: Well nourished, in no apparent distress. Eyes: PERRLA, EOMs, conjunctiva no swelling or erythema Sinuses: No Frontal/maxillary tenderness ENT/Mouth: Ext aud canals clear, TMs without erythema, bulging. No erythema, swelling, or exudate on post pharynx.  Tonsils not swollen or erythematous. Hearing normal.  Neck: Supple, thyroid normal.  Respiratory: Respiratory effort normal, BS equal bilaterally without rales, rhonchi, wheezing or stridor.  Cardio: RRR with no MRGs. Brisk peripheral pulses without edema.  Abdomen: Soft, + BS.  Non tender, no guarding, rebound, hernias, masses. Lymphatics: Non tender without lymphadenopathy.  Musculoskeletal: Full ROM, 5/5 strength, normal gait.  Skin: Warm, dry without rashes, lesions, ecchymosis.  Neuro: Cranial nerves intact. Normal muscle tone, no cerebellar symptoms. Sensation intact.  Psych: Awake and oriented X 3, normal affect, Insight and Judgment appropriate.     Adela Glimpse, NP 11:48 AM [facility]

## 2023-03-25 NOTE — Patient Instructions (Signed)

## 2023-03-26 ENCOUNTER — Other Ambulatory Visit: Payer: Self-pay | Admitting: Internal Medicine

## 2023-03-26 DIAGNOSIS — I10 Essential (primary) hypertension: Secondary | ICD-10-CM

## 2023-03-26 MED ORDER — VALSARTAN-HYDROCHLOROTHIAZIDE 80-12.5 MG PO TABS
ORAL_TABLET | ORAL | 3 refills | Status: DC
Start: 2023-03-26 — End: 2023-11-22

## 2023-03-29 ENCOUNTER — Ambulatory Visit (INDEPENDENT_AMBULATORY_CARE_PROVIDER_SITE_OTHER): Payer: Medicare HMO

## 2023-03-29 DIAGNOSIS — Z91038 Other insect allergy status: Secondary | ICD-10-CM

## 2023-04-01 ENCOUNTER — Ambulatory Visit (INDEPENDENT_AMBULATORY_CARE_PROVIDER_SITE_OTHER): Payer: Medicare HMO | Admitting: Nurse Practitioner

## 2023-04-01 ENCOUNTER — Encounter: Payer: Self-pay | Admitting: Nurse Practitioner

## 2023-04-01 VITALS — BP 118/62 | HR 67 | Temp 97.9°F | Ht 69.0 in | Wt 227.6 lb

## 2023-04-01 DIAGNOSIS — E1122 Type 2 diabetes mellitus with diabetic chronic kidney disease: Secondary | ICD-10-CM

## 2023-04-01 DIAGNOSIS — R1112 Projectile vomiting: Secondary | ICD-10-CM | POA: Diagnosis not present

## 2023-04-01 DIAGNOSIS — N182 Chronic kidney disease, stage 2 (mild): Secondary | ICD-10-CM

## 2023-04-01 DIAGNOSIS — K591 Functional diarrhea: Secondary | ICD-10-CM | POA: Diagnosis not present

## 2023-04-01 MED ORDER — SEMAGLUTIDE (1 MG/DOSE) 4 MG/3ML ~~LOC~~ SOPN
1.0000 mg | PEN_INJECTOR | SUBCUTANEOUS | 0 refills | Status: DC
Start: 2023-04-01 — End: 2023-05-16

## 2023-04-01 MED ORDER — CHOLESTYRAMINE 4 G PO PACK
1.0000 | PACK | Freq: Two times a day (BID) | ORAL | 0 refills | Status: DC
Start: 2023-04-01 — End: 2023-09-22

## 2023-04-01 MED ORDER — PROMETHAZINE HCL 12.5 MG PO TABS
12.5000 mg | ORAL_TABLET | Freq: Four times a day (QID) | ORAL | 0 refills | Status: DC | PRN
Start: 2023-04-01 — End: 2023-07-28

## 2023-04-01 NOTE — Progress Notes (Signed)
Assessment and Plan:  George Montgomery was seen today for an episodic visit.  Diagnoses and all order for this visit:  Projectile vomiting with nausea Secondary to 10 mg injection of Mounjaro. Discontinue - disucssed restarting with Ozempic 1 mg  Start tmt with Promethazine. Stay well hydrated with electrolyte supplement Contact off if s/s fail to improve. Continue to monitor  - promethazine (PHENERGAN) 12.5 MG tablet; Take 1 tablet (12.5 mg total) by mouth every 6 (six) hours as needed for nausea or vomiting.  Dispense: 30 tablet; Refill: 0  Functional diarrhea Start tmt with cholestyramine. Stay well hydrated. Contact office if s/s fail to improve.  - cholestyramine (QUESTRAN) 4 g packet; Take 1 packet by mouth 2 (two) times daily for 15 days.  Dispense: 30 each; Refill: 0  Meds ordered this encounter  Medications   promethazine (PHENERGAN) 12.5 MG tablet    Sig: Take 1 tablet (12.5 mg total) by mouth every 6 (six) hours as needed for nausea or vomiting.    Dispense:  30 tablet    Refill:  0    Order Specific Question:   Supervising Provider    Answer:   Lucky Cowboy [[id number]]   cholestyramine (QUESTRAN) 4 g packet    Sig: Take 1 packet by mouth 2 (two) times daily for 15 days.    Dispense:  30 each    Refill:  0    Order Specific Question:   Supervising Provider    Answer:   Lucky Cowboy [[id number]]   Semaglutide, 1 MG/DOSE, 4 MG/3ML SOPN    Sig: Inject 1 mg into the skin once a week.    Dispense:  3 mL    Refill:  0    Order Specific Question:   Supervising Provider    Answer:   Lucky Cowboy [phone]   Notify office for further evaluation and treatment, questions or concerns if s/s fail to improve. The risks and benefits of my recommendations, as well as other treatment options were discussed with the patient today. Questions were answered.  Further disposition pending results of labs. Discussed med's effects and SE's.    Over 20 minutes of exam, counseling, chart  review, and critical decision making was performed.   Future Appointments  Date Time Provider Department Center  04/09/2023  9:00 AM [facility] NURSE [facility] None  05/10/2023 11:00 AM Lucky Cowboy, MD [facility] None  05/12/2023 10:45 AM Ellamae Sia, DO [facility] None  05/25/2023 10:45 AM Dillingham, Alena Bills, DO [facility] None  11/02/2023 10:00 AM Raynelle Dick, NP [facility] None    ------------------------------------------------------------------------------------------------------------------   HPI BP 118/62   Pulse 67   Temp 97.9 F (36.6 C)   Ht 5\' 9"  (1.753 m)   Wt 227 lb 9.6 oz (103.2 kg)   SpO2 99%   BMI 33.61 kg/m   67 y.o.male presents for evaluation of nausea with vomiting accompanied by diarrhea.  He was seen in the ER 03/22/2023 for similar presentation that resolved and was thought to be secondary to [facility] injection.  He then followed up in office 03/25/23.  Discussed possible allergy to meat and restarting Mounjaro.  He follows with Allergy specialist and is currently being worked up for additional allergens. Patient states that he administered the [facility] x2 days ago and symptoms started.  He is unable to hold down any fluids or food.  Has been taking Ondansetron with minimal benefit.  Denies fever, chills, hematemesis, hematuria, melena.   Past Medical History:  Diagnosis Date  Arthritis    Asthma    Back pain    Chronic pain of both shoulders    Depression    Dyspnea    Elevated glucose    Environmental allergies    GERD (gastroesophageal reflux disease)    HLD (hyperlipidemia)    HTN (hypertension)    Insomnia    Obesity    OSA on CPAP 08/01/2014   Reflux    Trigger finger      Allergies  Allergen Reactions   Bee Venom Anaphylaxis   Pine Anaphylaxis   Estonia Nut (Berthollefia Excelsa)     pruritus   Lisinopril Other (See Comments)    Patient intolerant of drug which caused his tongue to tingle.   Penicillins Itching   Celebrex [Celecoxib]      Current Outpatient Medications on File Prior to Visit  Medication Sig   albuterol (VENTOLIN HFA) 108 (90 Base) MCG/ACT inhaler USE 2 INHALATIONS 15-20 MINUTES APART EVERY 4 HOURS AS NEEDED TO RESCUE ASTHMA   Albuterol-Budesonide (AIRSUPRA) 90-80 MCG/ACT AERO Inhale 2 puffs into the lungs every 4 (four) hours as needed (coughing, wheezing, chest tightness). Do not exceed 12 puffs in 24 hours.   bisoprolol (ZEBETA) 10 MG tablet Take 10 mg by mouth daily.   cetirizine (ZYRTEC ALLERGY) 10 MG tablet Take 1 tablet (10 mg total) by mouth daily.   Cholecalciferol (VITAMIN D) 125 MCG (5000 UT) CAPS Take by mouth.   EPINEPHrine (EPIPEN 2-PAK) 0.3 mg/0.3 mL IJ SOAJ injection Inject 0.3 mg into the muscle as needed for anaphylaxis.   escitalopram (LEXAPRO) 10 MG tablet Take 1 tablet Daily for Mood   fenofibrate micronized (LOFIBRA) 134 MG capsule TAKE 1 CAPSULE BY MOUTH ONCE DAILY FOR TRIGLYCERIDES (BLOOD FATS)   fexofenadine (ALLEGRA) 180 MG tablet Take by mouth.   fluticasone (FLONASE) 50 MCG/ACT nasal spray Place 1 spray into both nostrils 2 (two) times daily as needed (nasal congestion).   fluticasone furoate-vilanterol (BREO ELLIPTA) 200-25 MCG/ACT AEPB Inhale 1 puff into the lungs daily. Rinse mouth after each use.   ipratropium (ATROVENT) 0.06 % nasal spray Use 1 to 2 sprays each nostril 2 to 3 x /day as needed   meloxicam (MOBIC) 15 MG tablet TAKE 1/2 TO 1 (ONE-HALF TO ONE) TABLET BY MOUTH ONCE DAILY WITH FOOD FOR PAIN AND FOR INFLAMMATION   montelukast (SINGULAIR) 10 MG tablet Take  1 tablet  Daily for Allergies                                       /                                                  TAKE                                    BY                             MOUTH  ONCE DAILY   omeprazole (PRILOSEC) 20 MG capsule TAKE 1 CAPSULE BY MOUTH EVERY DAY   ondansetron (ZOFRAN) 4 MG tablet Take 1 tablet (4 mg total) by mouth every 6 (six) hours.    phentermine (ADIPEX-P) 37.5 MG tablet TAKE 1 TABLET EVERY MORNING FOR DIETING & WEIGHT LOSS   sildenafil (VIAGRA) 100 MG tablet Take   1/2 to 1 tablet   Daily  as needed for XXXX   tamsulosin (FLOMAX) 0.4 MG CAPS capsule TAKE 1 CAPSULE BY MOUTH AT BEDTIME FOR PROSTATE   tirzepatide (MOUNJARO) 10 MG/0.5ML Pen Inject 10 mg into the skin once a week.   valsartan-hydrochlorothiazide (DIOVAN-HCT) 80-12.5 MG tablet Take  1 tablet  Daily  for BP   Zinc 50 MG TABS Take by mouth.   No current facility-administered medications on file prior to visit.    ROS: all negative except what is noted in the HPI.   Physical Exam:  BP 118/62   Pulse 67   Temp 97.9 F (36.6 C)   Ht 5\' 9"  (1.753 m)   Wt 227 lb 9.6 oz (103.2 kg)   SpO2 99%   BMI 33.61 kg/m   General Appearance: NAD.  Awake, conversant and cooperative. Eyes: PERRLA, EOMs intact.  Sclera white.  Conjunctiva without erythema. Sinuses: No frontal/maxillary tenderness.  No nasal discharge. Nares patent.  ENT/Mouth: Ext aud canals clear.  Bilateral TMs w/DOL and without erythema or bulging. Hearing intact.  Posterior pharynx without swelling or exudate.  Tonsils without swelling or erythema.  Neck: Supple.  No masses, nodules or thyromegaly. Respiratory: Effort is regular with non-labored breathing. Breath sounds are equal bilaterally without rales, rhonchi, wheezing or stridor.  Cardio: RRR with no MRGs. Brisk peripheral pulses without edema.  Abdomen: Active BS in all four quadrants.  Soft and non-tender without guarding, rebound tenderness, hernias or masses. Lymphatics: Non tender without lymphadenopathy.  Musculoskeletal: Full ROM, 5/5 strength, normal ambulation.  No clubbing or cyanosis. Skin: Appropriate color for ethnicity. Warm without rashes, lesions, ecchymosis, ulcers.  Neuro: CN II-XII grossly normal. Normal muscle tone without cerebellar symptoms and intact sensation.   Psych: AO X 3,  appropriate mood and affect, insight and  judgment.     Adela Glimpse, NP 2:15 PM [facility]

## 2023-04-02 LAB — ALPHA-GAL PANEL
Allergen, Mutton, f88: 0.15 kU/L — ABNORMAL HIGH
Allergen, Pork, f26: 0.1 kU/L — ABNORMAL HIGH
Beef: 0.22 kU/L — ABNORMAL HIGH
GALACTOSE-ALPHA-1,3-GALACTOSE IGE*: 0.89 kU/L — ABNORMAL HIGH (ref <0.10)

## 2023-04-02 LAB — INTERPRETATION:

## 2023-04-05 ENCOUNTER — Encounter: Payer: Self-pay | Admitting: Nurse Practitioner

## 2023-04-05 NOTE — Patient Instructions (Signed)
Tirzepatide Injection What is this medication? TIRZEPATIDE (tir ZEP a tide) treats type 2 diabetes. It works by increasing insulin levels in your body, which decreases your blood sugar (glucose). Changes to diet and exercise are often combined with this medication. This medicine may be used for other purposes; ask your health care provider or pharmacist if you have questions. COMMON BRAND NAME(S): MOUNJARO What should I tell my care team before I take this medication? They need to know if you have any of these conditions: Endocrine tumors (MEN 2) or if someone in your family had these tumors Eye disease, vision problems Gallbladder disease History of pancreatitis Kidney disease Stomach or intestine problems Thyroid cancer or if someone in your family had thyroid cancer An unusual or allergic reaction to tirzepatide, other medications, foods, dyes, or preservatives Pregnant or trying to get pregnant Breast-feeding How should I use this medication? This medication is injected under the skin. You will be taught how to prepare and give it. It is given once every week (every 7 days). Keep taking it unless your health care provider tells you to stop. If you use this medication with insulin, you should inject this medication and the insulin separately. Do not mix them together. Do not give the injections right next to each other. Change (rotate) injection sites with each injection. This medication comes with INSTRUCTIONS FOR USE. Ask your pharmacist for directions on how to use this medication. Read the information carefully. Talk to your pharmacist or care team if you have questions. It is important that you put your used needles and syringes in a special sharps container. Do not put them in a trash can. If you do not have a sharps container, call your pharmacist or care team to get one. A special MedGuide will be given to you by the pharmacist with each prescription and refill. Be sure to read this  information carefully each time. Talk to your care team about the use of this medication in children. Special care may be needed. Overdosage: If you think you have taken too much of this medicine contact a poison control center or emergency room at once. NOTE: This medicine is only for you. Do not share this medicine with others. What if I miss a dose? If you miss a dose, take it as soon as you can unless it is more than 4 days (96 hours) late. If it is more than 4 days late, skip the missed dose. Take the next dose at the normal time. Do not take 2 doses within 3 days of each other. What may interact with this medication? Alcohol Antiviral medications for HIV or AIDS Aspirin and aspirin-like medications Beta blockers, such as atenolol, metoprolol, propranolol Certain medications for blood pressure, heart disease, irregular heart beat Chromium Clonidine Diuretics Estrogen or progestin hormones Fenofibrate Gemfibrozil Guanethidine Isoniazid Lanreotide Male hormones or anabolic steroids MAOIs, such as Marplan, Nardil, and Parnate Medications for weight loss Medications for allergies, asthma, cold, or cough Medications for depression, anxiety, or mental health conditions Niacin Nicotine NSAIDs, medications for pain and inflammation, such as ibuprofen or naproxen Octreotide Other medications for diabetes, such as glyburide, glipizide, or glimepiride Pasireotide Pentamidine Phenytoin Probenecid Quinolone antibiotics, such as ciprofloxacin, levofloxacin, ofloxacin Reserpine Some herbal dietary supplements Steroid medications, such as prednisone or cortisone Sulfamethoxazole; trimethoprim Thyroid hormones Warfarin This list may not describe all possible interactions. Give your health care provider a list of all the medicines, herbs, non-prescription drugs, or dietary supplements you use. Also tell them   if you smoke, drink alcohol, or use illegal drugs. Some items may interact with  your medicine. What should I watch for while using this medication? Visit your care team for regular checks on your progress. You may need blood work done while you are taking this medication. Your care team will monitor your HbA1C (A1C). This test shows what your average blood sugar (glucose) level was over the past 2 to 3 months. Know the symptoms of low blood sugar and know how to treat it. Always carry a source of quick sugar with you. Examples include hard sugar candy or glucose tablets. Make sure others know that you can choke if you eat or drink if your blood sugar is too low and you are unable to care for yourself. Get medical help at once. Tell your care team if you have high blood sugar. Your medication dose may change if your body is under stress. Some types of stress that may affect your blood sugar include fever, infection, and surgery. Check with your care team if you have severe diarrhea, nausea, and vomiting, or if you sweat a lot. The loss of too much body fluid may make it dangerous for you to take this medication. Do not share pens or cartridges with anyone, even if the needle is changed. Each pen should only be used by one person. Sharing could cause an infection. Wear a medical ID bracelet or chain. Carry a card that describes your condition. List the medications and doses you take on the card. Talk to your care team about your risk of cancer. You may be more at risk for certain types of cancer if you take this medication. Estrogen and progestin hormones may not work as well while you are taking this medication. If you take these as pills by mouth, your care team may recommend another type of contraception for 4 weeks after you start this medication and for 4 weeks after each dose increase. Talk to your care team about contraceptive options. They can help you find the option that works for you. What side effects may I notice from receiving this medication? Side effects that you  should report to your care team as soon as possible: Allergic reactions or angioedema--skin rash, itching or hives, swelling of the face, eyes, lips, tongue, arms, or legs, trouble swallowing or breathing Change in vision Dehydration--increased thirst, dry mouth, feeling faint or lightheaded, headache, dark yellow or brown urine Gallbladder problems--severe stomach pain, nausea, vomiting, fever Kidney injury--decrease in the amount of urine, swelling of the ankles, hands, or feet Pancreatitis--severe stomach pain that spreads to your back or gets worse after eating or when touched, fever, nausea, vomiting Thoughts of suicide or self-harm, worsening mood, feelings of depression Thyroid cancer--new mass or lump in the neck, pain or trouble swallowing, trouble breathing, hoarseness Side effects that usually do not require medical attention (report these to your care team if they continue or are bothersome): Diarrhea Loss of appetite Nausea Upset stomach This list may not describe all possible side effects. Call your doctor for medical advice about side effects. You may report side effects to FDA at 1-800-FDA-1088. Where should I keep my medication? Keep out of the reach of children and pets. Refrigeration (preferred): Store in the refrigerator. Keep this medication in the original carton until you are ready to take it. Do not freeze. Protect from light. Get rid of opened vials after use, even if there is medication left. Get rid of any unopened vials or pens   after the expiration date. Room Temperature: This medication may be stored at room temperature below 30 degrees C (86 degrees F) for up to 21 days. Keep this medication in the original carton until you are ready to take it. Protect from light. Avoid exposure to extreme heat. Get rid of opened vials after use, even if there is medication left. Get rid of any unopened vials or pens after 21 days, or after they expire, whichever is first. To get rid  of medications that are no longer needed or have expired: Take the medication to a medication take-back program. Check with your pharmacy or law enforcement to find a location. If you cannot return the medication, ask your pharmacist or care team how to get rid of this medication safely. NOTE: This sheet is a summary. It may not cover all possible information. If you have questions about this medicine, talk to your doctor, pharmacist, or health care provider.  2024 Elsevier/Gold Standard (2022-12-13 00:00:00)  

## 2023-04-09 ENCOUNTER — Ambulatory Visit (INDEPENDENT_AMBULATORY_CARE_PROVIDER_SITE_OTHER): Payer: Medicare HMO | Admitting: *Deleted

## 2023-04-09 DIAGNOSIS — Z91038 Other insect allergy status: Secondary | ICD-10-CM | POA: Diagnosis not present

## 2023-04-12 ENCOUNTER — Ambulatory Visit: Payer: Medicare HMO | Admitting: Allergy

## 2023-04-13 ENCOUNTER — Encounter: Payer: Self-pay | Admitting: Nurse Practitioner

## 2023-04-16 ENCOUNTER — Institutional Professional Consult (permissible substitution): Payer: Medicare HMO | Admitting: Plastic Surgery

## 2023-04-19 ENCOUNTER — Ambulatory Visit (INDEPENDENT_AMBULATORY_CARE_PROVIDER_SITE_OTHER): Payer: Medicare HMO

## 2023-04-19 DIAGNOSIS — R69 Illness, unspecified: Secondary | ICD-10-CM | POA: Diagnosis not present

## 2023-04-19 DIAGNOSIS — Z91038 Other insect allergy status: Secondary | ICD-10-CM

## 2023-04-27 ENCOUNTER — Ambulatory Visit (INDEPENDENT_AMBULATORY_CARE_PROVIDER_SITE_OTHER): Payer: Medicare HMO | Admitting: *Deleted

## 2023-04-27 DIAGNOSIS — Z91038 Other insect allergy status: Secondary | ICD-10-CM | POA: Diagnosis not present

## 2023-05-03 ENCOUNTER — Other Ambulatory Visit: Payer: Self-pay

## 2023-05-03 MED ORDER — BISOPROLOL FUMARATE 10 MG PO TABS: 10.0000 mg | ORAL_TABLET | Freq: Every day | ORAL | 0 refills | Status: DC

## 2023-05-04 ENCOUNTER — Ambulatory Visit: Payer: Medicare HMO

## 2023-05-04 ENCOUNTER — Encounter: Payer: Self-pay | Admitting: Allergy & Immunology

## 2023-05-04 DIAGNOSIS — Z91038 Other insect allergy status: Secondary | ICD-10-CM | POA: Diagnosis not present

## 2023-05-09 ENCOUNTER — Encounter: Payer: Self-pay | Admitting: Internal Medicine

## 2023-05-09 NOTE — Progress Notes (Signed)
Annual  Screening/Preventative Visit                            & Comprehensive Evaluation & Examination  Future Appointments  Date Time Provider Department  05/10/2023                       cpe 11:00 AM Lucky Cowboy, MD [facility]  05/12/2023 10:30 AM [facility] NURSE [facility]  05/12/2023 10:45 AM Ellamae Sia, DO [facility]  05/25/2023 10:45 AM Dillingham, Alena Bills, DO [facility]  11/02/2023                       wellness 10:00 AM Raynelle Dick, NP [facility]  05/17/2024                       cpe  2:00 PM Lucky Cowboy, MD [facility]             This very nice 67 y.o.  MWM  with  HTN, HLD, T2_NIDDM  and Vitamin D Deficiency   presents for a Screening /Preventative Visit & comprehensive evaluation and management of multiple medical co-morbidities. Patient has Morbid Obesity with consequent HTN, Diabetes & HLD.   Chest CT scan in  2020 showed Aortic Atherosclerosis.  Patient  has GERD controlled on his Omeprazole.  Patient is followed by Dr Vassie Loll for OSA on Bipap with improved sleep hygiene .  In June 2024, his alpha-gal panel was positive.         HTN predates since  2015.   Patient's BP has been controlled on Bisoprolol  and today's BP is at goal - 122/64.  Patient denies any cardiac symptoms as chest pain, palpitations, shortness of breath, dizziness or ankle swelling.        Patient's hyperlipidemia is controlled with diet and Fenofibrate Patient denies myalgias or other medication SE's. Last lipids were at goal  except elevated Trig's :  Lab Results  Component Value Date   CHOL 154 02/08/2023   HDL 32 (L) 02/08/2023   LDLCALC 93 02/08/2023   TRIG 193 (H) 02/08/2023   CHOLHDL 4.8 02/08/2023         Patient has Morbid Obesity (BMI 36.7+) and consequent  prediabetes /Insulin Resistance   (A1c 5.6%  Lelon Huh. insulin 29  /Jan 2016).  In May 2023 , A1c was 6.5% with also elevated Insulin 60.5  in Diabetic range . Patient is on Ozempic 1 mg /week.    Patient  denies reactive hypoglycemic symptoms, visual blurring, diabetic polys or paresthesias. Last A1c was not at goal .    Lab Results  Component Value Date   HGBA1C 6.0 (H) 02/08/2023   Wt Readings from Last 3 Encounters:  05/10/23 237 lb 9.6 oz (107.8 kg)  04/01/23 227 lb 9.6 oz (103.2 kg)  03/25/23 233 lb 3.2 oz (105.8 kg)         Finally, patient has history of Vitamin D Deficiency ("21" /2016) and last vitamin D was still low (goal 70-100) :   Lab Results  Component Value Date   VD25OH 54 10/26/2022       Current Outpatient Medications  Medication Instructions   albuterol (VENTOLIN HFA) 108 (90 Base) MCG/ACT inhaler USE 2  INHALATIONS 15-20 MINUTES APART EVERY 4 HOURS AS NEEDED TO RESCUE ASTHMA   Albuterol-Budesonide (AIRSUPRA) 90-80 MCG/ACT AERO 2 puffs, Inhalation, Every 4 hours PRN, Do not exceed 12 puffs in 24 hours.   bisoprolol  10 mg, Oral, Daily   cetirizine   10 mg, Oral, Daily   VITAMIN D  5000 u Oral   cholestyramine (QUESTRAN) 4 g packet 1 packet, Oral, 2 times daily   EPINEPHrine (EPIPEN 2-PAK) 0.3 mg, Intramuscular, As needed   escitalopram (LEXAPRO) 10 MG tablet Take 1 tablet Daily for Mood   fenofibrate  134 MG capsule TAKE 1 CAPSULE  DAILY    fexofenadine 180 MG tablet Oral   FLONASE nasal spray 1 spray, Each Nare, 2 times daily PRN   BREO ELLIPTA 200-25  1 puff, Inhalation, Daily, Rinse mouth after each use.   ipratropium  0.06 % nasal spray Use 1 to 2 sprays each nostril 2 to 3 x /day as needed   meloxicam (15 MG tablet TAKE 1/2 TO 1  TABLET DAILY    montelukast (SINGULAIR) 10 MG tablet Take  1 tablet  Daily    omeprazole (PRILOSEC) 20 MG capsule Oral, Daily   ondansetron (ZOFRAN) 4 mg, Oral, Every 6 hours   phentermine (ADIPEX-P) 37.5 MG tablet TAKE 1 TABLET EVERY MORNING   promethazine (PHENERGAN) 12.5 mg  Every 6 hours PRN   Semaglutide (1 MG/DOSE) 1 mg SQ, Weekly   sildenafil (VIAGRA) 100 MG tablet Take   1/2 to 1 tablet   Daily  as needed      tamsulosin (FLOMAX) 0.4 MG CAPS capsule TAKE 1 CAPSULE  AT BEDTIME     valsartan-hctz  80-12.5 MG tablet Take  1 tablet  Daily  for BP   Zinc 50 MG TABS Oral     Allergies  Allergen Reactions   Bee Venom Anaphylaxis   Pine Anaphylaxis   Peanut-Containing Drug Products Itching   Lisinopril Other (See Comments)    Patient intolerant of drug which caused his tongue to tingle.   Celebrex [Celecoxib]      Past Medical History:  Diagnosis Date   Arthritis    Asthma    Back pain    Chronic pain of both shoulders    Depression    Dyspnea    Elevated glucose    Environmental allergies    GERD (gastroesophageal reflux disease)    HLD (hyperlipidemia)    HTN (hypertension)    Insomnia    Obesity    OSA on CPAP 08/01/2014   Reflux    Trigger finger      Health Maintenance  Topic Date Due   Zoster Vaccines- Shingrix (1 of 2) Never done   COVID-19 Vaccine (4 - Booster) 10/22/2020   Hepatitis C Screening  07/15/2022 (Originally 01/18/1974)   INFLUENZA VACCINE  05/19/2022   TETANUS/TDAP  11/06/2026   Pneumonia Vaccine 19+ Years old  Completed   HPV VACCINES  Aged Out     Immunization History  Administered Date(s) Administered   Influenza Inj Mdck Quad  08/19/2017, 07/25/2019   Influenza Split 08/06/2015   Influenza, High Dose  08/07/2020, 07/15/2021   Influenza,inj,quad 08/20/2016, 07/20/2019   Moderna Sars-Covid-2 Vacc 01/04/2020, 01/25/2020   PFIZER-SARS-COV-2 Vacc 08/27/2020   PNEUMOCOCCAL -20 10/31/2021   PPD Test 06/03/2018, 11/28/2019   Pneumococcal -23 08/20/2016   Tdap 11/06/2016   Zoster, Live 11/06/2016    Last Colon - 02/13/2015 -  Dr Rennie Natter - Recc 5 year f/u  - still  overdue  since Apr 2021.   Past Surgical History:  Procedure Laterality Date   APPENDECTOMY  07/2012   bone spur removal     HEMORRHOID SURGERY  09/2012   HERNIA REPAIR       Family History  Problem Relation Age of Onset   Heart disease Mother    Macular degeneration Mother     Hypertension Mother    Hyperlipidemia Mother    Depression Mother    Anxiety disorder Mother    Cancer - Colon Other    Cancer - Prostate Other    Liver disease Father    Alcoholism Father    Stroke Maternal Aunt    Cancer - Colon Paternal Uncle    Cancer - Prostate Paternal Uncle      Social History   Tobacco Use   Smoking status: Former    Packs/day: 1.00    Years: 40.00    Pack years: 40.00    Types: Cigarettes    Quit date: 12/03/2012    Years since quitting: 9.2   Smokeless tobacco: Never  Substance Use Topics   Alcohol use: Yes    Alcohol/week: 2.0 standard drinks    Types: 2 Standard drinks or equivalent per week    Comment: occ   Drug use: No      ROS Constitutional: Denies fever, chills, weight loss/gain, headaches, insomnia,  night sweats or change in appetite. Does c/o fatigue. Eyes: Denies redness, blurred vision, diplopia, discharge, itchy or watery eyes.  ENT: Denies discharge, congestion, post nasal drip, epistaxis, sore throat, earache, hearing loss, dental pain, Tinnitus, Vertigo, Sinus pain or snoring.  Cardio: Denies chest pain, palpitations, irregular heartbeat, syncope, dyspnea, diaphoresis, orthopnea, PND, claudication or edema Respiratory: denies cough, dyspnea, DOE, pleurisy, hoarseness, laryngitis or wheezing.  Gastrointestinal: Denies dysphagia, heartburn, reflux, water brash, pain, cramps, nausea, vomiting, bloating, diarrhea, constipation, hematemesis, melena, hematochezia, jaundice or hemorrhoids Genitourinary: Denies dysuria, frequency, urgency, nocturia, hesitancy, discharge, hematuria or flank pain Musculoskeletal: Denies arthralgia, myalgia, stiffness, Jt. Swelling, pain, limp or strain/sprain. Denies Falls. Skin: Denies puritis, rash, hives, warts, acne, eczema or change in skin lesion Neuro: No weakness, tremor, incoordination, spasms, paresthesia or pain Psychiatric: Denies confusion, memory loss or sensory loss. Denies  Depression. Endocrine: Denies change in weight, skin, hair change, nocturia, and paresthesia, diabetic polys, visual blurring or hyper / hypo glycemic episodes.  Heme/Lymph: No excessive bleeding, bruising or enlarged lymph nodes.   Physical Exam  BP 122/64   Pulse 67   Temp (!) 97.5 F (36.4 C)   Resp 16   Ht 5' 8.5" (1.74 m)   Wt 237 lb 9.6 oz (107.8 kg)   SpO2 97%   BMI 35.60 kg/m   General Appearance: Well nourished and well groomed and in no apparent distress.  Eyes: PERRLA, EOMs, conjunctiva no swelling or erythema, normal fundi and vessels. Sinuses: No frontal/maxillary tenderness ENT/Mouth: EACs patent / TMs  nl. Nares clear without erythema, swelling, mucoid exudates. Oral hygiene is good. No erythema, swelling, or exudate. Tongue normal, non-obstructing. Tonsils not swollen or erythematous. Hearing normal.  Neck: Supple, thyroid not palpable. No bruits, nodes or JVD. Respiratory: Respiratory effort normal.  BS equal and clear bilateral without rales, rhonci, wheezing or stridor. Cardio: Heart sounds are normal with regular rate and rhythm and no murmurs, rubs or gallops. Peripheral pulses are normal and equal bilaterally without edema. No aortic or femoral bruits. Chest: symmetric with normal excursions and percussion.  Abdomen: Soft, with Nl bowel sounds. Nontender, no  guarding, rebound, hernias, masses, or organomegaly.  Lymphatics: Non tender without lymphadenopathy.  Musculoskeletal: Full ROM all peripheral extremities, joint stability, 5/5 strength, and normal gait. Skin: Warm and dry without rashes, lesions, cyanosis, clubbing or  ecchymosis.  Neuro: Cranial nerves intact, reflexes equal bilaterally. Normal muscle tone, no cerebellar symptoms. Sensation intact.  Pysch: Alert and oriented X 3 with normal affect, insight and judgment appropriate.   Assessment and Plan  1. Annual Preventative/Screening Exam    2. Essential hypertension  - EKG 12-Lead - [place],  RETROPERITNL ABD,  LTD - Urinalysis, Routine w reflex microscopic - Microalbumin / creatinine urine ratio - CBC with Differential/Platelet - COMPLETE METABOLIC PANEL WITH GFR - Magnesium - TSH   3. Hyperlipidemia associated with type 2 diabetes mellitus (HCC)  - EKG 12-Lead - [place], RETROPERITNL ABD,  LTD - Lipid panel - TSH   4. Type 2 diabetes mellitus with stage 2 chronic kidney  disease, without long-term current use of insulin (HCC)  - EKG 12-Lead - [place], RETROPERITNL ABD,  LTD - Hemoglobin A1c - Insulin, random   5. Insulin resistance  - EKG 12-Lead - [place], RETROPERITNL ABD,  LTD - Hemoglobin A1c - Insulin, random   6. Vitamin D deficiency  - VITAMIN D 25 Hydroxy    7. Thoracic aortic atherosclerosis (HCC) by Chest ct SCAN 0N 08/09/2019  - EKG 12-Lead - [place], RETROPERITNL ABD,  LTD - Lipid panel   8. BPH with obstruction/lower urinary tract symptoms  - PSA   9. OSA treated with BiPAP   10. Class 2 severe obesity due to excess calories with serious  comorbidity and body mass index (BMI) of 36.0 to 36.9 in adult (HCC)  - TSH   11. Prostate cancer screening  - PSA  12. Screening for colorectal cancer  - Ambulatory referral to Gastroenterology   13. Screening for ischemic heart disease  - EKG 12-Lead   14. Screening for AAA (abdominal aortic aneurysm)  - [place], RETROPERITNL ABD,  LTD   15. Medication management  - Urinalysis, Routine w reflex microscopic - Microalbumin / creatinine urine ratio - CBC with Differential/Platelet - COMPLETE METABOLIC PANEL WITH GFR - Magnesium - Lipid panel - TSH - Hemoglobin A1c - Insulin, random - VITAMIN D 25 Hydroxy            Patient was counseled in prudent diet, weight control to achieve/maintain BMI less than 25, BP monitoring, regular exercise and medications as discussed.  Discussed med effects and SE's. Routine screening labs and tests as requested with regular follow-up as recommended. Over  40 minutes of exam, counseling, chart review and high complex critical decision making was performed   Marinus Maw, MD

## 2023-05-09 NOTE — Patient Instructions (Signed)
Due to recent changes in healthcare laws, you may see the results of your imaging and laboratory studies on MyChart before your provider has had a chance to review them.  We understand that in some cases there may be results that are confusing or concerning to you. Not all laboratory results come back in the same time frame and the provider may be waiting for multiple results in order to interpret others.  Please give [place] 48 hours in order for your provider to thoroughly review all the results before contacting the office for clarification of your results.  ++++++++++++++++++++++++++  Alpha-gal Syndrome  Alpha-gal syndrome (AGS) is an allergic reaction to a type of sugar commonly called alpha-gal. It is found in the meat and organ meats of mammals, such as cows, pigs, and sheep. It may also be found in products that come from animals, such as gelatin, medicines, medicine capsules, some milk products, vaccines, and cosmetics. AGS causes an allergic reaction that can be immediate or delayed for several hours and can range from mild to severe. A mild reaction may cause nausea, vomiting, or an itchy rash (hives). A severe reaction can cause breathing difficulties or loss of consciousness (anaphylaxis). This can be life-threatening. What are the causes? This allergy is first triggered by a tick bite from a lone star or blackleg tick. These ticks bite animals, such as cows, pigs, or sheep, and pick up the alpha-gal sugar from their blood. If the same tick bites you, it may cause your body's defense system (immune system) to produce antibodies to alpha-gal and cause the allergic reaction. What increases the risk? People who live in areas of the [place] where the lone star tick is common are at highest risk, these areas may include: [place]. [place]. [place] into parts of [place]. People who are hunters and people who work in jobs that involve caring for trees (foresters) have an  increased risk of this condition. What are the signs or symptoms? AGS may not cause an allergic reaction every time you eat red meat or come into contact with alpha-gal. If you do have a reaction, symptoms may include: Hives. Severe stomachache. Nausea or vomiting. Swelling of the lips, face, tongue, or throat. Making a high-pitched whistling sound when you breathe, most often when you breathe out (wheezing). Sneezing and runny nose. Headache. Symptoms of anaphylaxis may include: Difficulty breathing. Difficulty swallowing. Dizziness. Fainting. This may happen due to a sudden drop in blood pressure. You may have AGS if you had anaphylaxis after eating something but do not have any known food allergies. Unlike other food allergies, the reaction does not start soon after the exposure to alpha-gal. There may be a delay of several hours. How is this diagnosed? This condition may be diagnosed based on signs and symptoms of the condition, especially if you have a history of tick bites and a delayed reaction to red meat. You may also have a blood test to check for antibodies to alpha-gal or a skin test to see if there is a reaction to alpha-gal. How is this treated? This condition may be treated by: Avoiding meat and organ meats that may contain alpha-gal. Avoiding medicines or other products that may contain alpha-gal. Using medicines to reduce an allergic reaction. Carrying an epinephrine auto-injector to use in case of a severe AGS reaction. AGS should be treated by an allergist or a health care provider who has experience with AGS. Follow these instructions at home:  Medicines Take over-the-counter and prescription  medicines only as told by your health care provider. Follow instructions from your health care provider about when and how to use an epinephrine auto-injector. General instructions Avoid red meat and organ meat, and check food labels for meat-based ingredients in packaged foods  such as soup, gravy, and flavoring. Work with your allergist to find what other foods or products you may need to avoid, some people may benefit from avoiding dairy. Keep all follow-up visits. This is important. How is this prevented? Take steps to prevent tick bites as frequent tick bites may increase the risk of an AGS reaction. These steps include: Avoiding woods and fields with high grass. Wearing clothing protected with the anti-tick chemical permethrin when outdoors in these areas or using a U.[place]. Environmental Protection Agency-approved insect repellent. Checking your clothing for ticks before you come back indoors. Checking your body for ticks when you take a shower. Checking your pets for ticks. Where to find more information Centers for Disease Control and Prevention: [employer].br [employer] of Allergy and Infectious Diseases: https://hahn.com/ Contact a health care provider if: You have any signs or symptoms of AGS food allergy. You have a tick bite that causes a skin reaction. Get help right away if: You have a severe AGS reaction. You have trouble swallowing or breathing. These symptoms may represent a serious problem that is an emergency. Do not wait to see if the symptoms will go away. Get medical help right away. Call your local emergency services (911 in the [place] Do not drive yourself to the hospital. Summary AGS is a food allergy caused by a tick bite. Red meats, organ meats, and other products or medicines may trigger the alpha-gal reaction. AGS reactions can be mild or severe. If you have AGS, you should not eat red meat, organ meat, or products that may contain alpha-gal. Avoiding tick bites prevents AGS and more serious AGS reactions.   ++++++++++++++++++++++++++  Vit D  & Vit C 1,000 mg   are recommended to help protect  against the Covid-19 and other Corona viruses.    Also it's recommended  to take  Zinc 50 mg  to help  protect against the  Covid-19   and best place to get  is also on [email or web address]  and don't pay more than 6-8 cents /pill !   +++++++++++++++++++++++++++++++++++++++ Recommend Adult Low Dose Aspirin or  coated  Aspirin 81 mg daily  To reduce risk of Colon Cancer 40 %,  Skin Cancer 26 % ,  Melanoma 46%  and  Pancreatic cancer 60% +++++++++++++++++++++++++++++++++++++++++ Vitamin D goal  is between 70-100.  Please make sure that you are taking your Vitamin D as directed.  It is very important as a natural anti-inflammatory  helping hair, skin, and nails, as well as reducing stroke and heart attack risk.  It helps your bones and helps with mood. It also decreases numerous cancer risks so please take it as directed.  Low Vit D is associated with a 200-300% higher risk for CANCER  and 200-300% higher risk for HEART   ATTACK  &  STROKE.   .....................................Marland Kitchen It is also associated with higher death rate at younger ages,  autoimmune diseases like Rheumatoid arthritis, Lupus, Multiple Sclerosis.    Also many other serious conditions, like depression, Alzheimer's Dementia, infertility, muscle aches, fatigue, fibromyalgia - just to name a few. +++++++++++++++++++++++++++++++++++++++++ Recommend the book "The END of DIETING" by Dr Monico Hoar  & the book "The END of DIABETES " by Dr Monico Hoar  At [email or web address] - get book & Audio CD's    Being diabetic has a  300% increased risk for heart attack, stroke, cancer, and alzheimer- type vascular dementia. It is very important that you work harder with diet by avoiding all foods that are white. Avoid white rice (brown & wild rice is OK), white potatoes (sweetpotatoes in moderation is OK), White bread or wheat bread or anything made out of white flour like bagels, donuts, rolls, buns, biscuits, cakes, pastries, cookies, pizza crust, and pasta (made from white flour & egg whites) - vegetarian pasta or spinach or wheat pasta is OK. Multigrain breads like  Arnold[employer] or [employer], or multigrain sandwich thins or flatbreads.  Diet, exercise and weight loss can reverse and cure diabetes in the early stages.  Diet, exercise and weight loss is very important in the control and prevention of complications of diabetes which affects every system in your body, ie. Brain - dementia/stroke, eyes - glaucoma/blindness, heart - heart attack/heart failure, kidneys - dialysis, stomach - gastric paralysis, intestines - malabsorption, nerves - severe painful neuritis, circulation - gangrene & loss of a leg(s), and finally cancer and Alzheimers.    I recommend avoid fried & greasy foods,  sweets/candy, white rice (brown or wild rice or Quinoa is OK), white potatoes (sweet potatoes are OK) - anything made from white flour - bagels, doughnuts, rolls, buns, biscuits,white and wheat breads, pizza crust and traditional pasta made of white flour & egg white(vegetarian pasta or spinach or wheat pasta is OK).  Multi-grain bread is OK - like multi-grain flat bread or sandwich thins. Avoid alcohol in excess. Exercise is also important.    Eat all the vegetables you want - avoid meat, especially red meat and dairy - especially cheese.  Cheese is the most concentrated form of trans-fats which is the worst thing to clog up our arteries. Veggie cheese is OK which can be found in the fresh produce section at [employer] or [employer] or [employer]  +++++++++++++++++++++++++++++++++++++++ DASH Eating Plan  DASH stands for "Dietary Approaches to Stop Hypertension."   The DASH eating plan is a healthy eating plan that has been shown to reduce high blood pressure (hypertension). Additional health benefits may include reducing the risk of type 2 diabetes mellitus, heart disease, and stroke. The DASH eating plan may also help with weight loss. WHAT DO I NEED TO KNOW ABOUT THE DASH EATING PLAN? For the DASH eating plan, you will follow these general guidelines: Choose foods with a  percent daily value for sodium of less than 5% (as listed on the food label). Use salt-free seasonings or herbs instead of table salt or sea salt. Check with your health care provider or pharmacist before using salt substitutes. Eat lower-sodium products, often labeled as "lower sodium" or "no salt added." Eat fresh foods. Eat more vegetables, fruits, and low-fat dairy products. Choose whole grains. Look for the word "whole" as the first word in the ingredient list. Choose fish  Limit sweets, desserts, sugars, and sugary drinks. Choose heart-healthy fats. Eat veggie cheese  Eat more home-cooked food and less restaurant, buffet, and fast food. Limit fried foods. Cook foods using methods other than frying. Limit canned vegetables. If you do use them, rinse them well to decrease the sodium. When eating at a restaurant, ask that your food be prepared with less salt, or no salt if possible.  WHAT FOODS CAN I EAT? Read Dr Francis Dowse Fuhrman's books on The End of Dieting & The End of Diabetes  Grains Whole grain or whole wheat bread. Brown rice. Whole grain or whole wheat pasta. Quinoa, bulgur, and whole grain cereals. Low-sodium cereals. Corn or whole wheat flour tortillas. Whole grain cornbread. Whole grain crackers. Low-sodium crackers.  Vegetables Fresh or frozen vegetables (raw, steamed, roasted, or grilled). Low-sodium or reduced-sodium tomato and vegetable juices. Low-sodium or reduced-sodium tomato sauce and paste. Low-sodium or reduced-sodium canned vegetables.   Fruits All fresh, canned (in natural juice), or frozen fruits.  Protein Products  All fish and seafood.  Dried beans, peas, or lentils. Unsalted nuts and seeds. Unsalted canned beans.  Dairy Low-fat dairy products, such as skim or 1% milk, 2% or reduced-fat cheeses, low-fat ricotta or cottage cheese, or plain low-fat yogurt. Low-sodium or reduced-sodium cheeses.  Fats and Oils Tub margarines without trans  fats. Light or reduced-fat mayonnaise and salad dressings (reduced sodium). Avocado. Safflower, olive, or canola oils. Natural peanut or almond butter.  Other Unsalted popcorn and pretzels. The items listed above may not be a complete list of recommended foods or beverages. Contact your dietitian for more options.  +++++++++++++++  WHAT FOODS ARE NOT RECOMMENDED? Grains/ White flour or wheat flour White bread. White pasta. White rice. Refined cornbread. Bagels and croissants. Crackers that contain trans fat.  Vegetables  Creamed or fried vegetables. Vegetables in a . Regular canned vegetables. Regular canned tomato sauce and paste. Regular tomato and vegetable juices.  Fruits Dried fruits. Canned fruit in light or heavy syrup. Fruit juice.  Meat and Other Protein Products Meat in general - RED meat & White meat.  Fatty cuts of meat. Ribs, chicken wings, all processed meats as bacon, sausage, bologna, salami, fatback, hot dogs, bratwurst and packaged luncheon meats.  Dairy Whole or 2% milk, cream, half-and-half, and cream cheese. Whole-fat or sweetened yogurt. Full-fat cheeses or blue cheese. Non-dairy creamers and whipped toppings. Processed cheese, cheese spreads, or cheese curds.  Condiments Onion and garlic salt, seasoned salt, table salt, and sea salt. Canned and packaged gravies. Worcestershire sauce. Tartar sauce. Barbecue sauce. Teriyaki sauce. Soy sauce, including reduced sodium. Steak sauce. Fish sauce. Oyster sauce. Cocktail sauce. Horseradish. Ketchup and mustard. Meat flavorings and tenderizers. Bouillon cubes. Hot sauce. Tabasco sauce. Marinades. Taco seasonings. Relishes.  Fats and Oils Butter, stick margarine, lard, shortening and bacon fat. Coconut, palm kernel, or palm oils. Regular salad dressings.  Pickles and olives. Salted popcorn and pretzels.  The items listed above may not be a complete list of foods and beverages to avoid.

## 2023-05-10 ENCOUNTER — Encounter: Payer: Self-pay | Admitting: Internal Medicine

## 2023-05-10 ENCOUNTER — Ambulatory Visit (INDEPENDENT_AMBULATORY_CARE_PROVIDER_SITE_OTHER): Payer: Medicare HMO | Admitting: Internal Medicine

## 2023-05-10 VITALS — BP 122/64 | HR 67 | Temp 97.5°F | Resp 16 | Ht 68.5 in | Wt 237.6 lb

## 2023-05-10 DIAGNOSIS — N182 Chronic kidney disease, stage 2 (mild): Secondary | ICD-10-CM | POA: Diagnosis not present

## 2023-05-10 DIAGNOSIS — E1169 Type 2 diabetes mellitus with other specified complication: Secondary | ICD-10-CM | POA: Diagnosis not present

## 2023-05-10 DIAGNOSIS — Z1211 Encounter for screening for malignant neoplasm of colon: Secondary | ICD-10-CM

## 2023-05-10 DIAGNOSIS — Z79899 Other long term (current) drug therapy: Secondary | ICD-10-CM | POA: Diagnosis not present

## 2023-05-10 DIAGNOSIS — Z136 Encounter for screening for cardiovascular disorders: Secondary | ICD-10-CM

## 2023-05-10 DIAGNOSIS — I10 Essential (primary) hypertension: Secondary | ICD-10-CM

## 2023-05-10 DIAGNOSIS — Z91018 Allergy to other foods: Secondary | ICD-10-CM

## 2023-05-10 DIAGNOSIS — G4733 Obstructive sleep apnea (adult) (pediatric): Secondary | ICD-10-CM

## 2023-05-10 DIAGNOSIS — Z0000 Encounter for general adult medical examination without abnormal findings: Secondary | ICD-10-CM

## 2023-05-10 DIAGNOSIS — E1122 Type 2 diabetes mellitus with diabetic chronic kidney disease: Secondary | ICD-10-CM | POA: Diagnosis not present

## 2023-05-10 DIAGNOSIS — Z125 Encounter for screening for malignant neoplasm of prostate: Secondary | ICD-10-CM

## 2023-05-10 DIAGNOSIS — N138 Other obstructive and reflux uropathy: Secondary | ICD-10-CM

## 2023-05-10 DIAGNOSIS — Z6836 Body mass index (BMI) 36.0-36.9, adult: Secondary | ICD-10-CM | POA: Diagnosis not present

## 2023-05-10 DIAGNOSIS — Z0001 Encounter for general adult medical examination with abnormal findings: Secondary | ICD-10-CM

## 2023-05-10 DIAGNOSIS — I700 Atherosclerosis of aorta: Secondary | ICD-10-CM | POA: Diagnosis not present

## 2023-05-10 DIAGNOSIS — N401 Enlarged prostate with lower urinary tract symptoms: Secondary | ICD-10-CM | POA: Diagnosis not present

## 2023-05-10 DIAGNOSIS — E559 Vitamin D deficiency, unspecified: Secondary | ICD-10-CM

## 2023-05-10 DIAGNOSIS — E785 Hyperlipidemia, unspecified: Secondary | ICD-10-CM | POA: Diagnosis not present

## 2023-05-10 LAB — CBC WITH DIFFERENTIAL/PLATELET
Lymphs Abs: 1918 cells/uL (ref 850–3900)
MCHC: 34.5 g/dL (ref 32.0–36.0)
MCV: 93.4 fL (ref 80.0–100.0)
Monocytes Relative: 7.4 %
Neutro Abs: 3369 cells/uL (ref 1500–7800)
Total Lymphocyte: 32.5 %

## 2023-05-10 MED ORDER — ONDANSETRON HCL 8 MG PO TABS: ORAL_TABLET | ORAL | 2 refills | Status: DC

## 2023-05-11 LAB — URINALYSIS, ROUTINE W REFLEX MICROSCOPIC: Nitrite: NEGATIVE

## 2023-05-11 LAB — COMPLETE METABOLIC PANEL WITH GFR
ALT: 28 U/L (ref 9–46)
AST: 18 U/L (ref 10–35)
Albumin: 4.5 g/dL (ref 3.6–5.1)
Calcium: 9.2 mg/dL (ref 8.6–10.3)
Glucose, Bld: 111 mg/dL — ABNORMAL HIGH (ref 65–99)
Sodium: 142 mmol/L (ref 135–146)
Total Bilirubin: 0.5 mg/dL (ref 0.2–1.2)
Total Protein: 6.8 g/dL (ref 6.1–8.1)

## 2023-05-11 LAB — CBC WITH DIFFERENTIAL/PLATELET
Absolute Monocytes: 437 cells/uL (ref 200–950)
Basophils Absolute: 30 cells/uL (ref 0–200)
Basophils Relative: 0.5 %
Eosinophils Relative: 2.5 %
HCT: 42.3 % (ref 38.5–50.0)
Hemoglobin: 14.6 g/dL (ref 13.2–17.1)
MCH: 32.2 pg (ref 27.0–33.0)
MPV: 10.5 fL (ref 7.5–12.5)
Neutrophils Relative %: 57.1 %
Platelets: 205 10*3/uL (ref 140–400)
RBC: 4.53 10*6/uL (ref 4.20–5.80)
RDW: 12.2 % (ref 11.0–15.0)
WBC: 5.9 10*3/uL (ref 3.8–10.8)

## 2023-05-11 LAB — LIPID PANEL: HDL: 40 mg/dL (ref >=40)

## 2023-05-11 LAB — MICROALBUMIN / CREATININE URINE RATIO: Creatinine, Urine: 39 mg/dL (ref 20–320)

## 2023-05-11 LAB — INSULIN, RANDOM: Insulin: 40.3 u[IU]/mL — ABNORMAL HIGH

## 2023-05-11 NOTE — Progress Notes (Unsigned)
Follow Up Note  RE: George Montgomery MRN: [id number] DOB: Apr 06, 1956 Date of Office Visit: 05/12/2023  Referring provider: Lucky Cowboy, MD Primary care provider: Lucky Cowboy, MD  Chief Complaint: No chief complaint on file.  History of Present Illness: I had the pleasure of seeing George Montgomery for a follow up visit at the [facility] on 05/11/2023. He is a 67 y.o. 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 His previous allergy office visit was on 12/07/2022 with Dr. Selena Batten. Today is a regular follow up visit.  on beta blocker Airsupra covered?  Hymenoptera allergy Past history - Went to ER few times after stings due to hives and swelling. Never required Epi. 2023 bloodwork positive to honeybee, white faced hornet, yellow jacket, wasp, yellow hornet and bumblebee. Interim history - interested in starting VIT.  Continue to avoid. Recommend starting injections for this first - 3 shots. Discussed risks and benefits.  Check with your insurance. Consent form was signed.  For mild symptoms you can take over the counter antihistamines such as Benadryl and monitor symptoms closely. If symptoms worsen or if you have severe symptoms including breathing issues, throat closure, significant swelling, whole body hives, severe diarrhea and vomiting, lightheadedness then inject epinephrine and seek immediate medical care afterwards. Action plan in place.    Moderate persistent asthma without complication Past history - Gets wheezing, coughing and shortness of breath mainly during URIs. Uses albuterol rarely but during the last URI needed to add on Trelegy for 2 weeks. 2023 CXR unremarkable. 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 Clinically feeling slightly improved.  Interim  history - only has issues with exertion.  Today's spirometry showed some restriction - mainly due to body habitus. Daily controller medication(s): continue Breo 1 puff once a day and rinse mouth after each use.  May use Airsupra rescue inhaler 2 puffs every 4 to 6 hours as needed for shortness of breath, chest tightness, coughing, and wheezing. Do not use more than 12 puffs in 24 hours. May use Airsupra rescue inhaler 2 puffs 5 to 15 minutes prior to strenuous physical activities. Monitor frequency of use. Rinse mouth after each use.    Seasonal and perennial allergic rhinitis Past history - Perennial rhinoconjunctivitis symptoms for many years mainly in the spring and fall.  Tried Singulair, Zyrtec and Flonase with some benefit.  No prior ENT evaluation.  2023 CT sinus showed mild mucosal thickening. 2023 skin testing showed: Positive to dust mites, mold and cockroach. 2023 bloodwork positive to dust mites, grass, ragweed.  Borderline to cat, cockroach, tree, weed pollen. Interim history - some symptoms.  Continue environmental control measures as below. Continue Singulair (montelukast) 10mg  daily at night. Use over the counter antihistamines such as Zyrtec (cetirizine), Claritin (loratadine), Allegra (fexofenadine), or Xyzal (levocetirizine) daily as needed. May take twice a day during allergy flares. May switch antihistamines every few months. Use Flonase (fluticasone) nasal spray 1 spray per nostril twice a day as needed for nasal congestion.  Use azelastine nasal spray 1-2 sprays per nostril twice a day as needed for runny nose/drainage. Nasal saline spray (i.e., Simply Saline) or nasal saline lavage (i.e., NeilMed) is recommended as needed and prior to medicated nasal sprays. If worsening symptoms - will refer to ENT next. Consider adding on environmental injections after finished build up with  the venoms.   Recurrent infections Keep track of infections and antibiotics use.    Penicillin allergy Consider penicillin allergy skin testing and in office drug challenge in the future.  Over 90% of people with history of penicillin allergy which occurred over 10 years ago are found to be non-allergic.    Anaphylactic reaction due to food, subsequent encounter Past history - Upper body itching after eating mixed nuts in December 2023. Has eaten peanut butter since then with no issues. 2023 skin testing showed: Positive to [place] nuts. 2023 bloodwork positive to [place] nuts, borderline to walnuts, pecan and pistachio.  More likely to have anaphylactic reaction to [place] nuts, walnuts. Continue strict avoidance of tree nuts. For mild symptoms you can take over the counter antihistamines such as Benadryl and monitor symptoms closely. If symptoms worsen or if you have severe symptoms including breathing issues, throat closure, significant swelling, whole body hives, severe diarrhea and vomiting, lightheadedness then inject epinephrine and seek immediate medical care afterwards.   Return in about 4 months (around 04/07/2023).    Assessment and Plan: George Montgomery is a 67 y.o. male with: No problem-specific Assessment & Plan notes found for this encounter.  No follow-ups on file.  No orders of the defined types were placed in this encounter.  Lab Orders  No laboratory test(s) ordered today    Diagnostics: Spirometry:  Tracings reviewed. His effort: {Blank single:19197::"Good reproducible efforts.","It was hard to get consistent efforts and there is a question as to whether this reflects a maximal maneuver.","Poor effort, data can not be interpreted."} FVC: ***L FEV1: ***L, ***% predicted FEV1/FVC ratio: ***% Interpretation: {Blank single:19197::"Spirometry consistent with mild obstructive disease","Spirometry consistent with moderate obstructive disease","Spirometry consistent with severe obstructive disease","Spirometry consistent with possible restrictive disease","Spirometry  consistent with mixed obstructive and restrictive disease","Spirometry uninterpretable due to technique","Spirometry consistent with normal pattern","No overt abnormalities noted given today's efforts"}.  Please see scanned spirometry results for details.  Skin Testing: {Blank single:19197::"Select foods","Environmental allergy panel","Environmental allergy panel and select foods","Food allergy panel","None","Deferred due to recent antihistamines use"}. *** Results discussed with patient/family.   Medication List:  Current Outpatient Medications  Medication Sig Dispense Refill   albuterol (VENTOLIN HFA) 108 (90 Base) MCG/ACT inhaler USE 2 INHALATIONS 15-20 MINUTES APART EVERY 4 HOURS AS NEEDED TO RESCUE ASTHMA 48 g 3   Albuterol-Budesonide (AIRSUPRA) 90-80 MCG/ACT AERO Inhale 2 puffs into the lungs every 4 (four) hours as needed (coughing, wheezing, chest tightness). Do not exceed 12 puffs in 24 hours. 10.7 g 2   cetirizine (ZYRTEC ALLERGY) 10 MG tablet Take 1 tablet (10 mg total) by mouth daily. 30 tablet 0   cholestyramine (QUESTRAN) 4 g packet Take 1 packet by mouth 2 (two) times daily for 15 days. 30 each 0   EPINEPHrine (EPIPEN 2-PAK) 0.3 mg/0.3 mL IJ SOAJ injection Inject 0.3 mg into the muscle as needed for anaphylaxis. 2 each 2   escitalopram (LEXAPRO) 10 MG tablet Take 1 tablet Daily for Mood 90 tablet 3   fenofibrate micronized (LOFIBRA) 134 MG capsule TAKE 1 CAPSULE BY MOUTH ONCE DAILY FOR TRIGLYCERIDES (BLOOD FATS) 90 capsule 0   fexofenadine (ALLEGRA) 180 MG tablet Take by mouth.     fluticasone (FLONASE) 50 MCG/ACT nasal spray Place 1 spray into both nostrils 2 (two) times daily as needed (nasal congestion). 16 g 5   fluticasone furoate-vilanterol (BREO ELLIPTA) 200-25 MCG/ACT AEPB Inhale 1 puff into the lungs daily. Rinse mouth after each use. 60 each 3   ipratropium (ATROVENT) 0.06 %  nasal spray Use 1 to 2 sprays each nostril 2 to 3 x /day as needed 45 mL 3   meloxicam (MOBIC)  15 MG tablet TAKE 1/2 TO 1 (ONE-HALF TO ONE) TABLET BY MOUTH ONCE DAILY WITH FOOD FOR PAIN AND FOR INFLAMMATION 90 tablet 0   montelukast (SINGULAIR) 10 MG tablet Take  1 tablet  Daily for Allergies                                       /                                                  TAKE                                    BY                             MOUTH                                            ONCE DAILY 90 tablet 3   omeprazole (PRILOSEC) 20 MG capsule TAKE 1 CAPSULE BY MOUTH EVERY DAY 90 capsule 0   ondansetron (ZOFRAN) 8 MG tablet Take  1/2 to 1 tablet   3 x / day  for Nausea 90 tablet 2   phentermine (ADIPEX-P) 37.5 MG tablet TAKE 1 TABLET EVERY MORNING FOR DIETING & WEIGHT LOSS 30 tablet 0   promethazine (PHENERGAN) 12.5 MG tablet Take 1 tablet (12.5 mg total) by mouth every 6 (six) hours as needed for nausea or vomiting. 30 tablet 0   Semaglutide, 1 MG/DOSE, 4 MG/3ML SOPN Inject 1 mg into the skin once a week. 3 mL 0   sildenafil (VIAGRA) 100 MG tablet Take   1/2 to 1 tablet   Daily  as needed for XXXX 30 tablet 1   tamsulosin (FLOMAX) 0.4 MG CAPS capsule TAKE 1 CAPSULE BY MOUTH AT BEDTIME FOR PROSTATE 90 capsule 0   valsartan-hydrochlorothiazide (DIOVAN-HCT) 80-12.5 MG tablet Take  1 tablet  Daily  for BP 90 tablet 3   Zinc 50 MG TABS Take by mouth.     No current facility-administered medications for this visit.   Allergies: Allergies  Allergen Reactions   Bee Venom Anaphylaxis   Pine Anaphylaxis   [place] Nut (Berthollefia Excelsa)     pruritus   Lisinopril Other (See Comments)    Patient intolerant of drug which caused his tongue to tingle.   Penicillins Itching   Celebrex [Celecoxib]    I reviewed his past medical history, social history, family history, and environmental history and no significant changes have been reported from his previous visit.  Review of Systems  Constitutional:  Negative for appetite change, chills, fever and unexpected weight change.  HENT:   Positive for congestion, postnasal drip and rhinorrhea.   Eyes:  Negative for itching.  Respiratory:  Negative for chest tightness, shortness of breath and wheezing.   Cardiovascular:  Negative for chest pain.  Gastrointestinal:  Negative  for abdominal pain.  Genitourinary:  Negative for difficulty urinating.  Skin:  Negative for rash.  Allergic/Immunologic: Positive for environmental allergies and food allergies.    Objective: There were no vitals taken for this visit. There is no height or weight on file to calculate BMI. Physical Exam Vitals and nursing note reviewed.  Constitutional:      Appearance: Normal appearance. He is well-developed. He is obese.  HENT:     Head: Normocephalic and atraumatic.     Right Ear: Tympanic membrane and external ear normal.     Left Ear: Tympanic membrane and external ear normal.     Nose: Nose normal.     Mouth/Throat:     Mouth: Mucous membranes are moist.     Pharynx: Oropharynx is clear.  Eyes:     Conjunctiva/sclera: Conjunctivae normal.  Cardiovascular:     Rate and Rhythm: Normal rate and regular rhythm.     Heart sounds: Normal heart sounds. No murmur heard. Pulmonary:     Effort: Pulmonary effort is normal.     Breath sounds: Normal breath sounds. No wheezing, rhonchi or rales.  Musculoskeletal:     Cervical back: Neck supple.  Skin:    General: Skin is warm.     Findings: No rash.  Neurological:     Mental Status: He is alert and oriented to person, place, and time.  Psychiatric:        Behavior: Behavior normal.    Previous notes and tests were reviewed. The plan was reviewed with the patient/family, and all questions/concerned were addressed.  It was my pleasure to see George Montgomery today and participate in his care. Please feel free to contact me with any questions or concerns.  Sincerely,  Wyline Mood, DO Allergy & Immunology  [facility] office: [phone] [facility] office:  [phone]

## 2023-05-11 NOTE — Progress Notes (Signed)
^<^<^<^<^<^<^<^<^<^<^<^<^<^<^<^<^<^<^<^<^<^<^<^<^<^<^<^<^<^<^<^<^<^<^<^<^ ^>^>^>^>^>^>^>^>^>^>^>>^>^>^>^>^>^>^>^>^>^>^>^>^>^>^>^>^>^>^>^>^>^>^>^>^>  -  Test results slightly outside the reference range are not unusual. If there is anything important, I will review this with you,  otherwise it is considered normal test values.  If you have further questions,  please do not hesitate to contact me at the office or via My Chart.   ^<^<^<^<^<^<^<^<^<^<^<^<^<^<^<^<^<^<^<^<^<^<^<^<^<^<^<^<^<^<^<^<^<^<^<^<^ ^>^>^>^>^>^>^>^>^>^>^>^>^>^>^>^>^>^>^>^>^>^>^>^>^>^>^>^>^>^>^>^>^>^>^>^>^  - A1c = 6.0% - Still borderline  & hopefully as you tolerate the Semaglutide,                                                                                  you'll begin to see the weight loss   ^>^>^>^>^>^>^>^>^>^>^>^>^>^>^>^>^>^>^>^>^>^>^>^>^>^>^>^>^>^>^>^>^>^>^>^>^ ^>^>^>^>^>^>^>^>^>^>^>^>^>^>^>^>^>^>^>^>^>^>^>^>^>^>^>^>^>^>^>^>^>^>^>^>^  -  Chol = 162  -  Excellent   - Very low risk for Heart Attack  / Stroke  ^>^>^>^>^>^>^>^>^>^>^>^>^>^>^>^>^>^>^>^>^>^>^>^>^>^>^>^>^>^>^>^>^>^>^>^>^ ^>^>^>^>^>^>^>^>^>^>^>^>^>^>^>^>^>^>^>^>^>^>^>^>^>^>^>^>^>^>^>^>^>^>^>^>^  -  PSA - Very Low - No Prostate Cancer  - Great  !  ^>^>^>^>^>^>^>^>^>^>^>^>^>^>^>^>^>^>^>^>^>^>^>^>^>^>^>^>^>^>^>^>^>^>^>^>^ ^>^>^>^>^>^>^>^>^>^>^>^>^>^>^>^>^>^>^>^>^>^>^>^>^>^>^>^>^>^>^>^>^>^>^>^>^  -  All Else - CBC - Kidneys - Electrolytes - Liver - Magnesium & Thyroid    - all  Normal / OK ^>^>^>^>^>^>^>^>^>^>^>^>^>^>^>^>^>^>^>^>^>^>^>^>^>^>^>^>^>^>^>^>^>^>^>^>^ ^>^>^>^>^>^>^>^>^>^>^>^>^>^>^>^>^>^>^>^>^>^>^>^>^>^>^>^>^>^>^>^>^>^>^>^>^  -  Keep up the [place] Work  !  ^>^>^>^>^>^>^>^>^>^>^>^>^>^>^>^>^>^>^>^>^>^>^>^>^>^>^>^>^>^>^>^>^>^>^>^>^ ^>^>^>^>^>^>^>^>^>^>^>^>^>^>^>^>^>^>^>^>^>^>^>^>^>^>^>^>^>^>^>^>^>^>^>^>^

## 2023-05-12 ENCOUNTER — Other Ambulatory Visit: Payer: Self-pay | Admitting: Nurse Practitioner

## 2023-05-12 ENCOUNTER — Other Ambulatory Visit: Payer: Self-pay

## 2023-05-12 ENCOUNTER — Ambulatory Visit: Payer: Medicare HMO | Admitting: *Deleted

## 2023-05-12 ENCOUNTER — Ambulatory Visit: Payer: Medicare HMO | Admitting: Allergy

## 2023-05-12 ENCOUNTER — Encounter: Payer: Self-pay | Admitting: Allergy

## 2023-05-12 VITALS — BP 126/68 | HR 66 | Temp 98.5°F | Wt 236.4 lb

## 2023-05-12 DIAGNOSIS — J302 Other seasonal allergic rhinitis: Secondary | ICD-10-CM

## 2023-05-12 DIAGNOSIS — J4540 Moderate persistent asthma, uncomplicated: Secondary | ICD-10-CM | POA: Diagnosis not present

## 2023-05-12 DIAGNOSIS — Z880 Allergy status to penicillin: Secondary | ICD-10-CM | POA: Diagnosis not present

## 2023-05-12 DIAGNOSIS — T7800XD Anaphylactic reaction due to unspecified food, subsequent encounter: Secondary | ICD-10-CM | POA: Diagnosis not present

## 2023-05-12 DIAGNOSIS — N401 Enlarged prostate with lower urinary tract symptoms: Secondary | ICD-10-CM

## 2023-05-12 DIAGNOSIS — Z91038 Other insect allergy status: Secondary | ICD-10-CM | POA: Diagnosis not present

## 2023-05-12 DIAGNOSIS — B999 Unspecified infectious disease: Secondary | ICD-10-CM

## 2023-05-12 DIAGNOSIS — J3089 Other allergic rhinitis: Secondary | ICD-10-CM | POA: Diagnosis not present

## 2023-05-12 LAB — URINALYSIS, ROUTINE W REFLEX MICROSCOPIC
Bilirubin Urine: NEGATIVE
Glucose, UA: NEGATIVE
Hgb urine dipstick: NEGATIVE
Ketones, ur: NEGATIVE
Leukocytes,Ua: NEGATIVE
Protein, ur: NEGATIVE
Specific Gravity, Urine: 1.012 (ref 1.001–1.035)
pH: 7.5 (ref 5.0–8.0)

## 2023-05-12 LAB — COMPLETE METABOLIC PANEL WITH GFR
AG Ratio: 2 (calc) (ref 1.0–2.5)
Alkaline phosphatase (APISO): 77 U/L (ref 35–144)
BUN: 19 mg/dL (ref 7–25)
CO2: 31 mmol/L (ref 20–32)
Chloride: 104 mmol/L (ref 98–110)
Creat: 0.86 mg/dL (ref 0.70–1.35)
Globulin: 2.3 g/dL (calc) (ref 1.9–3.7)
Potassium: 4.3 mmol/L (ref 3.5–5.3)
eGFR: 94 mL/min/{1.73_m2} (ref >=60)

## 2023-05-12 LAB — MAGNESIUM: Magnesium: 2.1 mg/dL (ref 1.5–2.5)

## 2023-05-12 LAB — LIPID PANEL
Cholesterol: 162 mg/dL (ref <200)
LDL Cholesterol (Calc): 94 mg/dL (calc)
Non-HDL Cholesterol (Calc): 122 mg/dL (calc) (ref <130)
Total CHOL/HDL Ratio: 4.1 (calc) (ref <5.0)
Triglycerides: 184 mg/dL — ABNORMAL HIGH (ref <150)

## 2023-05-12 LAB — PSA: PSA: 1.12 ng/mL (ref <=4.00)

## 2023-05-12 LAB — MICROALBUMIN / CREATININE URINE RATIO
Microalb Creat Ratio: 5 mg/g creat (ref <30)
Microalb, Ur: 0.2 mg/dL

## 2023-05-12 LAB — HEMOGLOBIN A1C W/OUT EAG: Hgb A1c MFr Bld: 6 % of total Hgb — ABNORMAL HIGH (ref <5.7)

## 2023-05-12 LAB — TSH: TSH: 1.19 mIU/L (ref 0.40–4.50)

## 2023-05-12 LAB — VITAMIN D 25 HYDROXY (VIT D DEFICIENCY, FRACTURES): Vit D, 25-Hydroxy: 42 ng/mL (ref 30–100)

## 2023-05-12 LAB — CBC WITH DIFFERENTIAL/PLATELET: Eosinophils Absolute: 148 cells/uL (ref 15–500)

## 2023-05-12 MED ORDER — FLUTICASONE FUROATE-VILANTEROL 200-25 MCG/ACT IN AEPB: 1.0000 | INHALATION_SPRAY | Freq: Every day | RESPIRATORY_TRACT | 3 refills | Status: DC

## 2023-05-12 NOTE — Assessment & Plan Note (Signed)
Past history - Perennial rhinoconjunctivitis symptoms for many years mainly in the spring and fall.  Tried Singulair, Zyrtec and Flonase with some benefit.  No prior ENT evaluation.  2023 CT sinus showed mild mucosal thickening. 2023 skin testing showed: Positive to dust mites, mold and cockroach. 2023 bloodwork positive to dust mites, grass, ragweed.  Borderline to cat, cockroach, tree, weed pollen. Interim history - some symptoms when outdoors. Reviewed nasal sprays. Continue environmental control measures as below. Continue Singulair (montelukast) 10mg  daily at night. Use over the counter antihistamines such as Zyrtec (cetirizine), Claritin (loratadine), Allegra (fexofenadine), or Xyzal (levocetirizine) daily as needed. May take twice a day during allergy flares. May switch antihistamines every few months. Use Flonase (fluticasone) nasal spray 1 spray per nostril twice a day as needed for nasal congestion.  Use azelastine or ipratropium nasal spray 1-2 sprays per nostril twice a day as needed for runny nose/drainage. Nasal saline spray (i.e., Simply Saline) or nasal saline lavage (i.e., NeilMed) is recommended as needed and prior to medicated nasal sprays. Start allergy injections - once done with venom build up. Had a detailed discussion with patient/family that clinical history is suggestive of allergic rhinitis, and may benefit from allergy immunotherapy (AIT). Discussed in detail regarding the dosing, schedule, side effects (mild to moderate local allergic reaction and rarely systemic allergic reactions including anaphylaxis), and benefits (significant improvement in nasal symptoms, seasonal flares of asthma) of immunotherapy with the patient. There is significant time commitment involved with allergy shots, which includes weekly immunotherapy injections for first 9-12 months and then biweekly to monthly injections for 3-5 years. Consent was signed.

## 2023-05-12 NOTE — Assessment & Plan Note (Signed)
Consider penicillin allergy skin testing and in office drug challenge in the future.  Over 90% of people with history of penicillin allergy which occurred over 10 years ago are found to be non-allergic.  

## 2023-05-12 NOTE — Assessment & Plan Note (Signed)
Past history - Gets wheezing, coughing and shortness of breath mainly during URIs. Uses albuterol rarely but during the last URI needed to add on Trelegy for 2 weeks. 2023 CXR unremarkable. 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 Clinically feeling slightly improved.  Interim history - uses Breo for a few days at a time in a month. Confused about instructions. Today's spirometry was unremarkable.  Daily controller medication(s): start Breo 1 puff once a day and rinse mouth after each use.  Rinse mouth with Listerine alcohol free mouthwash. If not covered let us know. May use albuterol rescue inhaler 2 puffs or nebulizer every 4 to 6 hours as needed for shortness of breath, chest tightness, coughing, and wheezing. May use albuterol rescue inhaler 2 puffs 5 to 15 minutes prior to strenuous physical activities. Monitor frequency of use - if you need to use it more than twice per week on a consistent basis let us know.

## 2023-05-12 NOTE — Assessment & Plan Note (Addendum)
Past history - Upper body itching after eating mixed nuts in December 2023. Has eaten peanut butter since then with no issues. 2023 skin testing showed: Positive to [place] nuts. 2023 bloodwork positive to [place] nuts, borderline to walnuts, pecan and pistachio.  More likely to have anaphylactic reaction to [place] nuts, walnuts. Interim history - had alpha gal testing due to vomiting/diarrhea after starting Mounjaro. Previously tolerated with no issues. Continue strict avoidance of tree nuts. Okay with eating peanuts.  Okay to reintroduce red meat into diet. Bloodwork was borderline positive only and I doubt it was due to alpha gal allergy. If having issues then stop and let us know. For mild symptoms you can take over the counter antihistamines such as Benadryl and monitor symptoms closely. If symptoms worsen or if you have severe symptoms including breathing issues, throat closure, significant swelling, whole body hives, severe diarrhea and vomiting, lightheadedness then inject epinephrine and seek immediate medical care afterwards.

## 2023-05-12 NOTE — Assessment & Plan Note (Signed)
Past history - Went to ER few times after stings due to hives and swelling. Never required Epi. 2023 bloodwork positive to honeybee, white faced hornet, yellow jacket, wasp, yellow hornet and bumblebee. Interim history - started VIT on 12/30/2022 (MV+HB+W) with no issues.  Continue to avoid. Continue venom injections - given today. For mild symptoms you can take over the counter antihistamines such as Benadryl and monitor symptoms closely. If symptoms worsen or if you have severe symptoms including breathing issues, throat closure, significant swelling, whole body hives, severe diarrhea and vomiting, lightheadedness then inject epinephrine and seek immediate medical care afterwards. Action plan in place.

## 2023-05-12 NOTE — Patient Instructions (Addendum)
Breathing Daily controller medication(s): start Breo 1 puff once a day and rinse mouth after each use.  Rinse mouth with Listerine alcohol free mouthwash. If not covered let us know. May use albuterol rescue inhaler 2 puffs or nebulizer every 4 to 6 hours as needed for shortness of breath, chest tightness, coughing, and wheezing. May use albuterol rescue inhaler 2 puffs 5 to 15 minutes prior to strenuous physical activities. Monitor frequency of use - if you need to use it more than twice per week on a consistent basis let us know.  Breathing control goals:  Full participation in all desired activities (may need albuterol before activity) Albuterol use two times or less a week on average (not counting use with activity) Cough interfering with sleep two times or less a month Oral steroids no more than once a year No hospitalizations  Food 2023 testing: positive to [place] nuts, borderline to walnuts, pecan and pistachio.  More likely to have anaphylactic reaction to [place] nuts, walnuts. Continue strict avoidance of tree nuts. Okay with eating peanuts.  Okay to reintroduce red meat into diet. Bloodwork was borderline positive only. If has issues stop and let us know.  Bee stings 2023 bloodwork positive to honeybee, white faced hornet, yellow jacket, wasp, yellow hornet and bumblebee. Continue to avoid. Continue venom injections - given today. You have 12 more weekly injections. When you hit maintenance dose and tell you to come every 4 weeks then make an appointment for your first environmental allergy shots. For mild symptoms you can take over the counter antihistamines such as Benadryl and monitor symptoms closely. If symptoms worsen or if you have severe symptoms including breathing issues, throat closure, significant swelling, whole body hives, severe diarrhea and vomiting, lightheadedness then inject epinephrine and seek immediate medical care afterwards. Action plan in place.    Environmental allergies 2023 skin testing positive to dust mites, mold and cockroach. 2023 bloodwork positive to dust mites, grass, ragweed.  Borderline to cat, cockroach, tree, weed pollen. Continue environmental control measures as below. Continue Singulair (montelukast) 10mg  daily at night. Use over the counter antihistamines such as Zyrtec (cetirizine), Claritin (loratadine), Allegra (fexofenadine), or Xyzal (levocetirizine) daily as needed. May take twice a day during allergy flares. May switch antihistamines every few months. Use Flonase (fluticasone) nasal spray 1 spray per nostril twice a day as needed for nasal congestion.  Glass bottle. Use azelastine or ipratropium nasal spray 1-2 sprays per nostril twice a day as needed for runny nose/drainage. White bottles. Nasal saline spray (i.e., Simply Saline) or nasal saline lavage (i.e., NeilMed) is recommended as needed and prior to medicated nasal sprays. Start allergy injections - once done with venom build up. Had a detailed discussion with patient/family that clinical history is suggestive of allergic rhinitis, and may benefit from allergy immunotherapy (AIT). Discussed in detail regarding the dosing, schedule, side effects (mild to moderate local allergic reaction and rarely systemic allergic reactions including anaphylaxis), and benefits (significant improvement in nasal symptoms, seasonal flares of asthma) of immunotherapy with the patient. There is significant time commitment involved with allergy shots, which includes weekly immunotherapy injections for first 9-12 months and then biweekly to monthly injections for 3-5 years. Consent was signed.  Infections Keep track of infections and antibiotics use.  Penicillin allergy: Consider penicillin allergy skin testing and in office drug challenge in the future.  Over 90% of people with history of penicillin allergy which occurred over 10 years ago are found to be non-allergic.  You  must be off antihistamines for 3-5 days before. Plan on being in the office for 2-3 hours. You must call to schedule an appointment and specify it's for a drug challenge.  A few days prior to the appointment, I will send in a prescription for amoxicillin liquid which you must bring to the appointment as well.   Follow up in 4 months or sooner if needed.

## 2023-05-12 NOTE — Progress Notes (Signed)
^<^<^<^<^<^<^<^<^<^<^<^<^<^<^<^<^<^<^<^<^<^<^<^<^<^<^<^<^<^<^<^<^<^<^<^<^ ^>^>^>^>^>^>^>^>^>^>^>>^>^>^>^>^>^>^>^>^>^>^>^>^>^>^>^>^>^>^>^>^>^>^>^>^>  -   Vitamin D  = 42 - finally return & is too Low    - Vitamin D goal is between 70-100.   - Please Restart  Vitamin D 5,000 units capsule  Daily   - It is very important as a natural anti-inflammatory and helping the                           immune system protect against viral infections, like the Covid-19    helping hair, skin, and nails, as well as reducing stroke and heart attack risk.   - It helps your bones and helps with mood.  - It also decreases numerous cancer risks so please                                                                                           take it as directed.   - Low Vit D is associated with a 200-300% higher risk for CANCER   and 200-300% higher risk for HEART   ATTACK  &  STROKE.    - It is also associated with higher death rate at younger ages,   autoimmune diseases like Rheumatoid arthritis, Lupus, Multiple Sclerosis.     - Also many other serious conditions, like depression, Alzheimer's  Dementia,  muscle aches, fatigue, fibromyalgia   ^<^<^<^<^<^<^<^<^<^<^<^<^<^<^<^<^<^<^<^<^<^<^<^<^<^<^<^<^<^<^<^<^<^<^<^<^ ^>^>^>^>^>^>^>^>^>^>^>^>^>^>^>^>^>^>^>^>^>^>^>^>^>^>^>^>^>^>^>^>^>^>^>^>^

## 2023-05-13 ENCOUNTER — Telehealth: Payer: Self-pay | Admitting: Plastic Surgery

## 2023-05-13 ENCOUNTER — Other Ambulatory Visit: Payer: Self-pay | Admitting: Nurse Practitioner

## 2023-05-13 ENCOUNTER — Other Ambulatory Visit: Payer: Self-pay | Admitting: Internal Medicine

## 2023-05-13 NOTE — Telephone Encounter (Signed)
Pt called asking for a Laser quote for just his nose and not the whole face please and thank you

## 2023-05-16 ENCOUNTER — Other Ambulatory Visit: Payer: Self-pay | Admitting: Internal Medicine

## 2023-05-16 DIAGNOSIS — G4733 Obstructive sleep apnea (adult) (pediatric): Secondary | ICD-10-CM | POA: Diagnosis not present

## 2023-05-16 DIAGNOSIS — E1122 Type 2 diabetes mellitus with diabetic chronic kidney disease: Secondary | ICD-10-CM

## 2023-05-19 ENCOUNTER — Ambulatory Visit (INDEPENDENT_AMBULATORY_CARE_PROVIDER_SITE_OTHER): Payer: Medicare HMO

## 2023-05-19 DIAGNOSIS — Z91038 Other insect allergy status: Secondary | ICD-10-CM

## 2023-05-25 ENCOUNTER — Institutional Professional Consult (permissible substitution): Payer: Medicare HMO | Admitting: Plastic Surgery

## 2023-05-26 ENCOUNTER — Ambulatory Visit: Payer: Medicare HMO

## 2023-05-26 DIAGNOSIS — Z91038 Other insect allergy status: Secondary | ICD-10-CM | POA: Diagnosis not present

## 2023-06-01 ENCOUNTER — Ambulatory Visit (INDEPENDENT_AMBULATORY_CARE_PROVIDER_SITE_OTHER): Payer: Medicare HMO | Admitting: *Deleted

## 2023-06-01 ENCOUNTER — Other Ambulatory Visit: Payer: Self-pay | Admitting: Nurse Practitioner

## 2023-06-01 DIAGNOSIS — Z91038 Other insect allergy status: Secondary | ICD-10-CM | POA: Diagnosis not present

## 2023-06-08 ENCOUNTER — Ambulatory Visit: Payer: Medicare HMO | Admitting: *Deleted

## 2023-06-08 DIAGNOSIS — Z91038 Other insect allergy status: Secondary | ICD-10-CM

## 2023-06-15 ENCOUNTER — Ambulatory Visit (INDEPENDENT_AMBULATORY_CARE_PROVIDER_SITE_OTHER): Payer: Medicare HMO | Admitting: *Deleted

## 2023-06-15 DIAGNOSIS — Z91038 Other insect allergy status: Secondary | ICD-10-CM | POA: Diagnosis not present

## 2023-06-16 DIAGNOSIS — G4733 Obstructive sleep apnea (adult) (pediatric): Secondary | ICD-10-CM | POA: Diagnosis not present

## 2023-06-17 ENCOUNTER — Other Ambulatory Visit: Payer: Self-pay | Admitting: Nurse Practitioner

## 2023-06-22 ENCOUNTER — Other Ambulatory Visit: Payer: Self-pay | Admitting: Internal Medicine

## 2023-06-22 ENCOUNTER — Ambulatory Visit (INDEPENDENT_AMBULATORY_CARE_PROVIDER_SITE_OTHER): Payer: Medicare HMO | Admitting: *Deleted

## 2023-06-22 DIAGNOSIS — Z91038 Other insect allergy status: Secondary | ICD-10-CM | POA: Diagnosis not present

## 2023-06-22 DIAGNOSIS — E1122 Type 2 diabetes mellitus with diabetic chronic kidney disease: Secondary | ICD-10-CM

## 2023-06-25 ENCOUNTER — Ambulatory Visit (INDEPENDENT_AMBULATORY_CARE_PROVIDER_SITE_OTHER): Payer: Medicare HMO | Admitting: Plastic Surgery

## 2023-06-25 DIAGNOSIS — L711 Rhinophyma: Secondary | ICD-10-CM

## 2023-06-25 DIAGNOSIS — L719 Rosacea, unspecified: Secondary | ICD-10-CM

## 2023-06-25 NOTE — Progress Notes (Signed)
   Subjective:    Patient ID: George Montgomery, male    DOB: 1956-08-03, 67 y.o.   MRN: [id number]  The patient is a 67 year old male made by phone for further discussion about his face.  He is a really good candidate for the BBL laser.  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  He has not smoked in over 9 years.  He has asthma, hypertension and hyperlipidemia.  He is otherwise in good health and motivated to see what he can do about his face.      Review of Systems  Constitutional: Negative.   HENT: Negative.    Eyes: Negative.   Respiratory: Negative.    Cardiovascular: Negative.   Gastrointestinal: Negative.   Endocrine: Negative.   Genitourinary: Negative.        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 Rosacea  L71.9       I connected with  George Montgomery on 06/25/23 by phone and verified that I am speaking with the correct person using two identifiers.  The patient was at home and I was at the office.  We spent 10 minutes in discussion.  The patient would like to move ahead with laser treatment using BBL to start with.   I discussed the limitations of evaluation and management by telemedicine. The patient expressed understanding and agreed to proceed.

## 2023-06-28 DIAGNOSIS — E1142 Type 2 diabetes mellitus with diabetic polyneuropathy: Secondary | ICD-10-CM | POA: Diagnosis not present

## 2023-06-28 DIAGNOSIS — M21611 Bunion of right foot: Secondary | ICD-10-CM | POA: Diagnosis not present

## 2023-06-28 DIAGNOSIS — M2021 Hallux rigidus, right foot: Secondary | ICD-10-CM | POA: Diagnosis not present

## 2023-07-05 ENCOUNTER — Ambulatory Visit (INDEPENDENT_AMBULATORY_CARE_PROVIDER_SITE_OTHER): Payer: Medicare HMO

## 2023-07-05 DIAGNOSIS — Z91038 Other insect allergy status: Secondary | ICD-10-CM | POA: Diagnosis not present

## 2023-07-11 ENCOUNTER — Other Ambulatory Visit: Payer: Self-pay | Admitting: Internal Medicine

## 2023-07-11 DIAGNOSIS — F329 Major depressive disorder, single episode, unspecified: Secondary | ICD-10-CM

## 2023-07-12 ENCOUNTER — Ambulatory Visit: Payer: Medicare HMO

## 2023-07-13 ENCOUNTER — Telehealth: Payer: Self-pay | Admitting: Allergy

## 2023-07-13 MED ORDER — NYSTATIN 100000 UNIT/ML MT SUSP: 5.0000 mL | Freq: Four times a day (QID) | OROMUCOSAL | 0 refills | Status: DC

## 2023-07-13 NOTE — Telephone Encounter (Signed)
Please call patient back.  I typically prescribe the nystatin swish and swallow for oral thrush first and if that doesn't clear it up then give diflucan unless there is a history of the nystatin not working.  Please clarify which one does he prefer to start with.  Also make sure he is using listerine alcohol free mouthwash to swish his mouth out after using breo to prevent thrush from occurring. Thank you.

## 2023-07-13 NOTE — Telephone Encounter (Signed)
Lm for pt to call us back about this °

## 2023-07-13 NOTE — Telephone Encounter (Signed)
Do you want to sendin diflucan?

## 2023-07-13 NOTE — Telephone Encounter (Signed)
Pt has tried and liked the oral nystatin just out of it currently   [address] [employer]

## 2023-07-13 NOTE — Addendum Note (Signed)
Addended by: Ellamae Sia on: 07/13/2023 05:26 PM   Modules accepted: Orders

## 2023-07-13 NOTE — Telephone Encounter (Signed)
Pt states he is taking Breo and has thrush, he had a prescription last time for thrush and would like a refill on that medication sent to [address]

## 2023-07-13 NOTE — Telephone Encounter (Signed)
Rx sent in

## 2023-07-16 ENCOUNTER — Ambulatory Visit: Payer: Medicare HMO

## 2023-07-17 DIAGNOSIS — G4733 Obstructive sleep apnea (adult) (pediatric): Secondary | ICD-10-CM | POA: Diagnosis not present

## 2023-07-19 ENCOUNTER — Encounter: Payer: Self-pay | Admitting: Plastic Surgery

## 2023-07-19 ENCOUNTER — Ambulatory Visit (INDEPENDENT_AMBULATORY_CARE_PROVIDER_SITE_OTHER): Payer: Self-pay | Admitting: Plastic Surgery

## 2023-07-19 DIAGNOSIS — L719 Rosacea, unspecified: Secondary | ICD-10-CM

## 2023-07-19 NOTE — Progress Notes (Signed)
Preoperative Dx: rosacea  Postoperative Dx:  same  Procedure: laser to face   Anesthesia: none  Description of Procedure:  Risks and complications were explained to the patient. Consent was confirmed and signed. Eye protection was placed. Time out was called and all information was confirmed to be correct. The area  area was prepped with alcohol and wiped dry. The Heroic laser was set at 532 nm at 2 J/cm2 and then the nose at 8 J. The face was lasered. The patient tolerated the procedure well and there were no complications. The patient is to follow up in 4 weeks.

## 2023-07-28 ENCOUNTER — Other Ambulatory Visit: Payer: Self-pay

## 2023-07-28 ENCOUNTER — Ambulatory Visit (INDEPENDENT_AMBULATORY_CARE_PROVIDER_SITE_OTHER): Payer: Medicare HMO | Admitting: Nurse Practitioner

## 2023-07-28 ENCOUNTER — Encounter: Payer: Self-pay | Admitting: Nurse Practitioner

## 2023-07-28 VITALS — BP 112/58 | HR 67 | Temp 97.5°F | Ht 68.5 in | Wt 232.4 lb

## 2023-07-28 DIAGNOSIS — I10 Essential (primary) hypertension: Secondary | ICD-10-CM | POA: Diagnosis not present

## 2023-07-28 DIAGNOSIS — Z1152 Encounter for screening for COVID-19: Secondary | ICD-10-CM

## 2023-07-28 DIAGNOSIS — B370 Candidal stomatitis: Secondary | ICD-10-CM | POA: Diagnosis not present

## 2023-07-28 DIAGNOSIS — N182 Chronic kidney disease, stage 2 (mild): Secondary | ICD-10-CM

## 2023-07-28 DIAGNOSIS — E1122 Type 2 diabetes mellitus with diabetic chronic kidney disease: Secondary | ICD-10-CM

## 2023-07-28 DIAGNOSIS — J0100 Acute maxillary sinusitis, unspecified: Secondary | ICD-10-CM

## 2023-07-28 DIAGNOSIS — R6889 Other general symptoms and signs: Secondary | ICD-10-CM

## 2023-07-28 LAB — POCT INFLUENZA A/B
Influenza A, POC: NEGATIVE
Influenza B, POC: NEGATIVE

## 2023-07-28 LAB — POC COVID19 BINAXNOW: SARS Coronavirus 2 Ag: NEGATIVE

## 2023-07-28 MED ORDER — PROMETHAZINE-DM 6.25-15 MG/5ML PO SYRP
5.0000 mL | ORAL_SOLUTION | Freq: Four times a day (QID) | ORAL | 1 refills | Status: DC | PRN
Start: 2023-07-28 — End: 2024-06-14

## 2023-07-28 MED ORDER — SULFAMETHOXAZOLE-TRIMETHOPRIM 800-160 MG PO TABS
1.0000 | ORAL_TABLET | Freq: Two times a day (BID) | ORAL | 0 refills | Status: AC
Start: 2023-07-28 — End: 2023-08-04

## 2023-07-28 MED ORDER — FLUCONAZOLE 150 MG PO TABS
ORAL_TABLET | ORAL | 0 refills | Status: DC
Start: 2023-07-28 — End: 2023-12-22

## 2023-07-28 NOTE — Patient Instructions (Addendum)
Stop bisoprolol, restart Valsartan/hydrochlorothiazide 80/12.5 mg daily  Bactrim DS twice a day for 7 days Promethazine DM cough syrup as needed every 8 hours   Diflucan 1 pill today and second pill 3 days from now for thrush  Sinus Infection, Adult A sinus infection is soreness and swelling (inflammation) of your sinuses. Sinuses are hollow spaces in the bones around your face. They are located: Around your eyes. In the middle of your forehead. Behind your nose. In your cheekbones. Your sinuses and nasal passages are lined with a fluid called mucus. Mucus drains out of your sinuses. Swelling can trap mucus in your sinuses. This lets germs (bacteria, virus, or fungus) grow, which leads to infection. Most of the time, this condition is caused by a virus. What are the causes? Allergies. Asthma. Germs. Things that block your nose or sinuses. Growths in the nose (nasal polyps). Chemicals or irritants in the air. A fungus. This is rare. What increases the risk? Having a weak body defense system (immune system). Doing a lot of swimming or diving. Using nasal sprays too much. Smoking. What are the signs or symptoms? The main symptoms of this condition are pain and a feeling of pressure around the sinuses. Other symptoms include: Stuffy nose (congestion). This may make it hard to breathe through your nose. Runny nose (drainage). Soreness, swelling, and warmth in the sinuses. A cough that may get worse at night. Being unable to smell and taste. Mucus that collects in the throat or the back of the nose (postnasal drip). This may cause a sore throat or bad breath. Being very tired (fatigued). A fever. How is this diagnosed? Your symptoms. Your medical history. A physical exam. Tests to find out if your condition is short-term (acute) or long-term (chronic). Your doctor may: Check your nose for growths (polyps). Check your sinuses using a tool that has a light on one end  (endoscope). Check for allergies or germs. Do imaging tests, such as an MRI or CT scan. How is this treated? Treatment for this condition depends on the cause and whether it is short-term or long-term. If caused by a virus, your symptoms should go away on their own within 10 days. You may be given medicines to relieve symptoms. They include: Medicines that shrink swollen tissue in the nose. A spray that treats swelling of the nostrils. Rinses that help get rid of thick mucus in your nose (nasal saline washes). Medicines that treat allergies (antihistamines). Over-the-counter pain relievers. If caused by bacteria, your doctor may wait to see if you will get better without treatment. You may be given antibiotic medicine if you have: A very bad infection. A weak body defense system. If caused by growths in the nose, surgery may be needed. Follow these instructions at home: Medicines Take, use, or apply over-the-counter and prescription medicines only as told by your doctor. These may include nasal sprays. If you were prescribed an antibiotic medicine, take it as told by your doctor. Do not stop taking it even if you start to feel better. Hydrate and humidify  Drink enough water to keep your pee (urine) pale yellow. Use a cool mist humidifier to keep the humidity level in your home above 50%. Breathe in steam for 10-15 minutes, 3-4 times a day, or as told by your doctor. You can do this in the bathroom while a hot shower is running. Try not to spend time in cool or dry air. Rest Rest as much as you can. Sleep with your  head raised (elevated). Make sure you get enough sleep each night. General instructions  Put a warm, moist washcloth on your face 3-4 times a day, or as often as told by your doctor. Use nasal saline washes as often as told by your doctor. Wash your hands often with soap and water. If you cannot use soap and water, use hand sanitizer. Do not smoke. Avoid being around  people who are smoking (secondhand smoke). Keep all follow-up visits. Contact a doctor if: You have a fever. Your symptoms get worse. Your symptoms do not get better within 10 days. Get help right away if: You have a very bad headache. You cannot stop vomiting. You have very bad pain or swelling around your face or eyes. You have trouble seeing. You feel confused. Your neck is stiff. You have trouble breathing. These symptoms may be an emergency. Get help right away. Call 911. Do not wait to see if the symptoms will go away. Do not drive yourself to the hospital. Summary A sinus infection is swelling of your sinuses. Sinuses are hollow spaces in the bones around your face. This condition is caused by tissues in your nose that become inflamed or swollen. This traps germs. These can lead to infection. If you were prescribed an antibiotic medicine, take it as told by your doctor. Do not stop taking it even if you start to feel better. Keep all follow-up visits. This information is not intended to replace advice given to you by your health care provider. Make sure you discuss any questions you have with your health care provider. Document Revised: 09/09/2021 Document Reviewed: 09/09/2021 Elsevier Patient Education  2024 [employer].

## 2023-07-28 NOTE — Progress Notes (Signed)
Assessment and Plan:  George Montgomery was seen today for acute visit.  Diagnoses and all orders for this visit:  Encounter for screening for COVID-19 -     POC COVID-19 - negative  Essential hypertension - Stop bisoprolol 10 mg and restart Valsartan 80/12.5 mg every day , monitor BP at home and notify office if consistently > 130 80 or < 100/60 - Continue  DASH diet, exercise and monitor at home.   Flu-like symptoms -     POCT Influenza A/B- negative  Type 2 diabetes mellitus with stage 2 chronic kidney disease, without long-term current use of insulin (HCC) Continue Ozempic 1 mg SQ QW Continue diet and exercise  Thrush, oral Brush teeth and tongue after using Breo -     fluconazole (DIFLUCAN) 150 MG tablet; Take 1 tab day 1 and second tab on day 4  Acute maxillary sinusitis, recurrence not specified Push fluids Start Bactrim DS BID x 7 Promethazine DM as needed for cough and Tylenol for aches and pains -     sulfamethoxazole-trimethoprim (BACTRIM DS) 800-160 MG tablet; Take 1 tablet by mouth 2 (two) times daily for 7 days. -     promethazine-dextromethorphan (PROMETHAZINE-DM) 6.25-15 MG/5ML syrup; Take 5 mLs by mouth 4 (four) times daily as needed for cough.       Further disposition pending results of labs. Discussed med's effects and SE's.   Over 30 minutes of exam, counseling, chart review, and critical decision making was performed.   Future Appointments  Date Time Provider Department Center  07/29/2023  8:30 AM [facility] NURSE [facility] None  08/11/2023 11:30 AM Lucky Cowboy, MD [facility] None  08/27/2023  2:45 PM Dillingham, Alena Bills, DO [facility] None  09/13/2023 10:00 AM Ellamae Sia, DO [facility] None  09/28/2023  3:15 PM Dillingham, Alena Bills, DO [facility] None  11/02/2023 10:00 AM Raynelle Dick, NP [facility] None  05/22/2024  2:00 PM Lucky Cowboy, MD [facility] None     ------------------------------------------------------------------------------------------------------------------   HPI BP (!) 112/58   Pulse 67   Temp (!) 97.5 F (36.4 C)   Ht 5' 8.5" (1.74 m)   Wt 232 lb 6.4 oz (105.4 kg)   SpO2 95%   BMI 34.82 kg/m  67 y.o.male presents for complaints of diarrhea, chills, fatigue, myalgias, productive cough with green mucus, nasal congestion and drainage, weakness which has been occurring for approximately 1 week. Covid and flu are negative. Has been using neti pot daily. OTC vicks cough syrup and tylenol for pain.   He does have a history chronic bronchitis and is taking Breo daily and albuterol as needed. He also uses Singulair, Allegra and Flonase for allergies.  Xray at dentist showed infection in sinuses and implant was postponed  Pt is to be taking bisoprolol 10 mg and Valsartan /hydrochlorothiazide 80/12.5 mg every day but stopped taking Valsartan /hydrochlorothiazide 04/2023 when his BP got too low and is only taking bisoprolol BP Readings from Last 3 Encounters:  07/28/23 (!) 112/58  05/12/23 126/68  05/10/23 122/64  Denies headaches, chest pain, shortness of breath and dizziness   BMI is Body mass index is 34.82 kg/m., he has been working on diet and exercise. Wt Readings from Last 3 Encounters:  07/28/23 232 lb 6.4 oz (105.4 kg)  05/12/23 236 lb 6.4 oz (107.2 kg)  05/10/23 237 lb 9.6 oz (107.8 kg)   Currently on Ozempic 1 mg SQ QW and denies polydipsia, polyuria, visual disturbances, vomiting and weight loss.  Lab Results  Component Value  Date   HGBA1C 6.0 (H) 05/10/2023     Past Medical History:  Diagnosis Date   Arthritis    Asthma    Back pain    Chronic pain of both shoulders    Depression    Dyspnea    Elevated glucose    Environmental allergies    GERD (gastroesophageal reflux disease)    HLD (hyperlipidemia)    HTN (hypertension)    Insomnia    Obesity    OSA on CPAP 08/01/2014   Reflux    Trigger  finger      Allergies  Allergen Reactions   Bee Venom Anaphylaxis   Pine Anaphylaxis   Estonia Nut (Berthollefia Excelsa)     pruritus   Lisinopril Other (See Comments)    Patient intolerant of drug which caused his tongue to tingle.   Penicillins Itching   Celebrex [Celecoxib]     Current Outpatient Medications on File Prior to Visit  Medication Sig   albuterol (VENTOLIN HFA) 108 (90 Base) MCG/ACT inhaler USE 2 INHALATIONS 15-20 MINUTES APART EVERY 4 HOURS AS NEEDED TO RESCUE ASTHMA   cetirizine (ZYRTEC ALLERGY) 10 MG tablet Take 1 tablet (10 mg total) by mouth daily.   cholestyramine (QUESTRAN) 4 g packet Take 1 packet by mouth 2 (two) times daily for 15 days.   EPINEPHrine (EPIPEN 2-PAK) 0.3 mg/0.3 mL IJ SOAJ injection Inject 0.3 mg into the muscle as needed for anaphylaxis.   escitalopram (LEXAPRO) 10 MG tablet Take  1 tablet  Daily for Mood                                                              /                                                                   TAKE                                         BY                                                 MOUTH   fenofibrate micronized (LOFIBRA) 134 MG capsule TAKE 1 CAPSULE BY MOUTH ONCE DAILY FOR TRIGLYCERIDES (BLOOD FATS)   fexofenadine (ALLEGRA) 180 MG tablet Take by mouth.   fluticasone (FLONASE) 50 MCG/ACT nasal spray Place 1 spray into both nostrils 2 (two) times daily as needed (nasal congestion).   fluticasone furoate-vilanterol (BREO ELLIPTA) 200-25 MCG/ACT AEPB Inhale 1 puff into the lungs daily. Rinse mouth after each use.   ipratropium (ATROVENT) 0.06 % nasal spray Use 1 to 2 sprays each nostril 2 to 3 x /day as needed   meloxicam (MOBIC) 15 MG tablet Take 1/2 to 1 tablet Daily with Food for Pain & Inflammation                                                                   /  TAKE                                 BY                                    MOUTH                                ONCE  ?  ?  ?  DAILY   montelukast (SINGULAIR) 10 MG tablet Take  1 tablet  Daily for Allergies                                       /                                                  TAKE                                    BY                             MOUTH                                            ONCE DAILY   nystatin (MYCOSTATIN) 100000 UNIT/ML suspension Take 5 mLs (500,000 Units total) by mouth 4 (four) times daily. Swish and swallow.   omeprazole (PRILOSEC) 20 MG capsule TAKE 1 CAPSULE BY MOUTH EVERY DAY   ondansetron (ZOFRAN) 8 MG tablet Take  1/2 to 1 tablet   3 x / day  for Nausea   phentermine (ADIPEX-P) 37.5 MG tablet TAKE 1 TABLET EVERY MORNING FOR DIETING & WEIGHT LOSS   promethazine (PHENERGAN) 12.5 MG tablet Take 1 tablet (12.5 mg total) by mouth every 6 (six) hours as needed for nausea or vomiting.   Semaglutide, 1 MG/DOSE, (OZEMPIC, 1 MG/DOSE,) 4 MG/3ML SOPN INJECT 1 MG (0.75 ML) INTO SKIN EVERY 7 DAYS FOR DIABETES ( DX: E11.29 )   sildenafil (VIAGRA) 100 MG tablet Take   1/2 to 1 tablet   Daily  as needed for XXXX   tamsulosin (FLOMAX) 0.4 MG CAPS capsule TAKE 1 CAPSULE BY MOUTH AT BEDTIME FOR PROSTATE   valsartan-hydrochlorothiazide (DIOVAN-HCT) 80-12.5 MG tablet Take  1 tablet  Daily  for BP   Zinc 50 MG TABS Take by mouth.   No current facility-administered medications on file prior to visit.    ROS: all negative except above.   Physical Exam:  BP (!) 112/58   Pulse 67   Temp (!) 97.5 F (36.4 C)   Ht 5' 8.5" (1.74 m)   Wt 232 lb 6.4 oz (105.4 kg)   SpO2 95%   BMI 34.82 kg/m   General Appearance: Well nourished, in no apparent distress. Eyes: PERRLA, EOMs, conjunctiva no swelling or erythema Sinuses: Positive maxillary  tenderness ENT/Mouth: Ext aud canals clear, TMs without erythema, bulging. No erythema, swelling, or exudate on post pharynx.  Hearing normal.  Neck: Supple, thyroid normal.  Respiratory: Respiratory effort normal, BS equal bilaterally  without rales, rhonchi, wheezing or stridor.  Cardio: RRR with no MRGs. Brisk peripheral pulses without edema.  Abdomen: Soft, + BS.  Non tender, no guarding, rebound, hernias, masses. Lymphatics: + sudmaxillary left adenopathy Musculoskeletal: Full ROM, 5/5 strength, normal gait.  Skin: Warm, dry without rashes, lesions, ecchymosis.  Neuro: Cranial nerves intact. Normal muscle tone, no cerebellar symptoms. Sensation intact.  Psych: Awake and oriented X 3, normal affect, Insight and Judgment appropriate.     Raynelle Dick, NP 11:53 AM [facility]

## 2023-07-29 ENCOUNTER — Ambulatory Visit (INDEPENDENT_AMBULATORY_CARE_PROVIDER_SITE_OTHER): Payer: Medicare HMO

## 2023-07-29 DIAGNOSIS — Z91038 Other insect allergy status: Secondary | ICD-10-CM

## 2023-08-05 ENCOUNTER — Ambulatory Visit (INDEPENDENT_AMBULATORY_CARE_PROVIDER_SITE_OTHER): Payer: Medicare HMO | Admitting: *Deleted

## 2023-08-05 DIAGNOSIS — Z91038 Other insect allergy status: Secondary | ICD-10-CM | POA: Diagnosis not present

## 2023-08-11 ENCOUNTER — Encounter: Payer: Self-pay | Admitting: Internal Medicine

## 2023-08-11 ENCOUNTER — Other Ambulatory Visit: Payer: Self-pay | Admitting: Nurse Practitioner

## 2023-08-11 ENCOUNTER — Ambulatory Visit: Payer: Medicare HMO | Admitting: Internal Medicine

## 2023-08-11 DIAGNOSIS — N138 Other obstructive and reflux uropathy: Secondary | ICD-10-CM

## 2023-08-11 NOTE — Progress Notes (Signed)
C  A  N  C  E  L  L  E  D                                                 Future Appointments  Date Time Provider Department  08/11/2023                    3 mo ov 11:30 AM George Cowboy, MD [facility]  08/27/2023  2:45 PM Dillingham, Alena Bills, DO [facility]  09/13/2023 10:00 AM George Sia, DO [facility]  09/28/2023  3:15 PM Dillingham, Alena Bills, DO [facility]  11/02/2023                    wellness 10:00 AM George Dick, NP [facility]  05/22/2024                     cpe   2:00 PM George Cowboy, MD [facility]    History of Present Illness:       This very nice 67 y.o. MWM  with HTN, HLD, Pre-Diabetes and Vitamin D Deficiency presents for 3 month follow up. Chest CT scan in Oct 2020 showed Aortic Atherosclerosis. Patient has Morbid Obesity  with BMI 37.15 contributing to his HTN, Diabetes  & Hyperlipidemia.         Patient is treated for HTN & BP has been controlled at home. Today's BP is at goal -                        . Patient has had no complaints of any cardiac type chest pain, palpitations, dyspnea George Montgomery /PND, dizziness, claudication or dependent edema.        Hyperlipidemia is controlled with diet & meds. Patient denies myalgias or other med  SE's. Last Lipids were at goal except elevated Trig's :  Lab Results  Component Value Date   CHOL 162 05/10/2023   HDL 40 05/10/2023   LDLCALC 94 05/10/2023   TRIG 184 (H)  05/10/2023   CHOLHDL 4.1 05/10/2023     Also, the patient has history of PreDiabetes & Insulin Resistance and in May 2023 his A1c was 6.5% entering into T2_DM  and has had no symptoms of reactive hypoglycemia, diabetic polys, paresthesias or visual blurring.  Last A1c was near goal :  Lab Results  Component Value Date   HGBA1C 6.0 (H) 05/10/2023    Wt Readings from Last 3 Encounters:  07/28/23 232 lb 6.4 oz (105.4 kg)  05/12/23 236 lb 6.4 oz (107.2 kg)  05/10/23 237 lb 9.6 oz (107.8 kg)                                                          Further, the patient also has history of Vitamin D Deficiency and supplements vitamin D without any suspected side-effects. Last vitamin D was still slightly low :  Lab Results  Component Value Date   VD25OH 53 02/18/2022       Current Outpatient Medications  Medication Instructions   albuterol HFA inhaler USE 2 INHALATIONS  EVERY 4 HOURS AS NEEDED TO RESCUE ASTHMA   Albuterol-Budesonide (AIRSUPRA) 90-80 MCG/ACT AERO 2 puffs Every 4 hours PRN, Do not exceed 12 puffs in 24 hours.   azelastine (ASTELIN) 0.1 % nasal spray 1-2 sprays times daily PRN, Use in each nostril   bisoprolol (ZEBETA) 5 mg, Oral, Daily   cetirizine (ZYRTEC ALLERGY) 10 mg, Oral, Daily   Cholecalciferol (VITAMIN D) 125 MCG (5000 UT) CAPS Oral   ciprofloxacin (CILOXAN) 0.3 % ophthalmic solution 1 drop, Both Eyes, Daily PRN   Dulaglutide (TRULICITY) 4.5 MG/0.5ML SOPN Inject 4.5 mg into Skin every 7 days   EPINEPHrine (EPIPEN 2-PAK) 0.3 mg, Intramuscular, As needed   escitalopram (LEXAPRO) 10 MG tablet Take 1 tablet Daily for Mood   fenofibrate 134 MG capsule TAKE 1 CAPSULE  DAILY    fluocinonide (LIDEX) 0.05 % external solution 1 Application, Topical, Daily PRN   fluticasone (FLONASE) 50 MCG/ACT nasal spray 1 spray, Each Nare, 2 times daily PRN   BREO ELLIPTA 200-25 MCG/ACT AEPB 1 puff, Inhalation, Daily   HYDROcodone-acetaminophen  5-325 MG tablet Take 1/2 to 1 tablet  every 3 to 4 hours as needed    ketoconazole (NIZORAL) 2 % cream 1 Application, Topical, Daily PRN   meloxicam (MOBIC) 15 MG tablet TAKE 1/2 TO 1 TABLET DAILY   montelukast (SINGULAIR) 10 MG tablet Take  1 tablet  Daily   omeprazole (PRILOSEC) 20 mg, Oral, Daily   phentermine  37.5 MG tablet TAKE 1 TABLET EVERY MORNING    sildenafil (VIAGRA) 100 MG tablet TAKE ONE TABLET DAILY AS NEEDED   tamsulosin (FLOMAX) 0.4 MG CAPS capsule TAKE 1 CAPSULE AT BEDTIME    triamcinolone cream (KENALOG) 0.1 % 1 Application 2 times daily   valsartan-hctz 80-12.5 MG tablet 1 tablet, Daily, for blood pressure     Allergies  Allergen Reactions   Bee Venom Anaphylaxis   Pine Anaphylaxis   Lisinopril Other (See Comments)    Patient intolerant of drug which caused his tongue to tingle.  Celebrex [Celecoxib]      PMHx:   Past Medical History:  Diagnosis Date   Arthritis    Asthma    Back pain    Chronic pain of both shoulders    Depression    Dyspnea    Elevated glucose    Environmental allergies    GERD (gastroesophageal reflux disease)    HLD (hyperlipidemia)    HTN (hypertension)    Insomnia    Obesity    OSA on CPAP 08/01/2014   Reflux    Trigger finger      Immunization History  Administered Date(s) Administered   Influenza Inj Mdck Quad With Preservative 08/19/2017, 07/25/2019   Influenza Split 08/06/2015   Influenza, High Dose Seasonal PF 08/07/2020, 07/15/2021   Influenza,inj,quad, With Preservative 08/20/2016, 07/20/2019   Moderna Sars-Covid-2 Vacc 01/04/2020, 01/25/2020   PFIZER  SARS-COV-2 Vacc 08/27/2020   PPD Test 06/03/2018, 11/28/2019   Pneumococcal -23 08/20/2016   Tdap 11/06/2016   Zoster, Live 11/06/2016     Past Surgical History:  Procedure Laterality Date   APPENDECTOMY  07/2012   bone spur removal     HEMORRHOID SURGERY  09/2012   HERNIA REPAIR      FHx:    Reviewed / unchanged  SHx:    Reviewed / unchanged   Systems Review:  Constitutional:  Denies fever, chills, wt changes, headaches, insomnia, fatigue, night sweats, change in appetite. Eyes: Denies redness, blurred vision, diplopia, discharge, itchy, watery eyes.  ENT: Denies discharge, congestion, post nasal drip, epistaxis, sore throat, earache, hearing loss, dental pain, tinnitus, vertigo, sinus pain, snoring.  CV: Denies chest pain, palpitations, irregular heartbeat, syncope, dyspnea, diaphoresis, orthopnea, PND, claudication or edema. Respiratory: denies cough, dyspnea, DOE, pleurisy, hoarseness, laryngitis, wheezing.  Gastrointestinal: Denies dysphagia, odynophagia, heartburn, reflux, water brash, abdominal pain or cramps, nausea, vomiting, bloating, diarrhea, constipation, hematemesis, melena, hematochezia  or hemorrhoids. Genitourinary: Denies dysuria, frequency, urgency, nocturia, hesitancy, discharge, hematuria or flank pain. Musculoskeletal: Denies arthralgias, myalgias, stiffness, jt. swelling, pain, limping or strain/sprain.  Skin: Denies pruritus, rash, hives, warts, acne, eczema or change in skin lesion(s). Neuro: No weakness, tremor, incoordination, spasms, paresthesia or pain. Psychiatric: Denies confusion, memory loss or sensory loss. Endo: Denies change in weight, skin or hair change.  Heme/Lymph: No excessive bleeding, bruising or enlarged lymph nodes.  Physical Exam  There were no vitals taken for this visit.  Appears  well nourished, well groomed  and in no distress.  Eyes: PERRLA, EOMs, conjunctiva no swelling or erythema. Sinuses: No frontal/maxillary tenderness ENT/Mouth: EAC's clear, TM's nl w/o erythema, bulging. Nares clear w/o erythema, swelling, exudates. Oropharynx clear without erythema or exudates. Oral hygiene is good. Tongue normal, non obstructing. Hearing intact.  Neck: Supple. Thyroid not palpable. Car 2+/2+ without bruits, nodes or JVD. Chest: Respirations nl with BS clear & equal w/o rales, rhonchi, wheezing or stridor.  Cor: Heart  sounds normal w/ regular rate and rhythm without sig. murmurs, gallops, clicks or rubs. Peripheral pulses normal and equal  without edema.  Abdomen: Soft & bowel sounds normal. Non-tender w/o guarding, rebound, hernias, masses or organomegaly.  Lymphatics: Unremarkable.  Musculoskeletal: Full ROM all peripheral extremities, joint stability, 5/5 strength and normal gait.  Skin: Warm, dry without exposed rashes, lesions or ecchymosis apparent.  Neuro: Cranial nerves intact, reflexes equal bilaterally. Sensory-motor testing grossly intact. Tendon reflexes grossly intact.  Pysch: Alert & oriented x 3.  Insight and judgement nl & appropriate. No ideations.  Assessment and Plan:  1. Class 2 severe  obesity due to excess calories with serious comorbidity  (HTN. HLD & T2_DM)  and body mass index (BMI) of 37.0 to 37.9 in adult (HCC)   2. Essential hypertension  - Continue medication, monitor blood pressure at home.  - Continue DASH diet.  Reminder to go to the ER if any CP,  SOB, nausea, dizziness, severe HA, changes vision/speech.   - CBC with Differential/Platelet - COMPLETE METABOLIC PANEL WITH GFR - Magnesium - TSH  3. Hyperlipidemia associated with type 2 diabetes mellitus (HCC)  - Continue diet/meds, exercise,& lifestyle modifications.  - Continue monitor periodic cholesterol/liver & renal functions     - Lipid panel - TSH  4. Type 2 diabetes mellitus with stage 2 chronic kidney  disease, without long-term current use of insulin (HCC)  - Continue diet, exercise  - Lifestyle modifications.  - Monitor appropriate labs    - Hemoglobin A1c - Insulin, random  5. Vitamin D deficiency    - Continue supplementation    - VITAMIN D 25 Hydroxy   6. Thoracic aortic atherosclerosis (HCC) by Chest ct SCAN 0N 08/09/2019  - Lipid panel  7. Medication management  - CBC with Differential/Platelet - COMPLETE METABOLIC PANEL WITH GFR - Magnesium - Lipid panel - TSH - Hemoglobin  A1c - Insulin, random - VITAMIN D 25 Hydroxy          Discussed  regular exercise, BP monitoring, weight control to achieve/maintain BMI less than 25 and discussed med and SE's. Recommended labs to assess and monitor clinical status with further disposition pending results of labs.  I discussed the assessment and treatment plan with the patient. The patient was provided an opportunity to ask questions and all were answered. The patient agreed with the plan and demonstrated an understanding of the instructions.  I provided over 30 minutes of exam, counseling, chart review and  complex critical decision making.        The patient was advised to call back or seek an in-person evaluation if the symptoms worsen or if the condition fails to improve as anticipated.   Marinus Maw, MD

## 2023-08-11 NOTE — Patient Instructions (Signed)

## 2023-08-12 ENCOUNTER — Ambulatory Visit: Payer: Medicare HMO | Admitting: *Deleted

## 2023-08-12 DIAGNOSIS — Z91038 Other insect allergy status: Secondary | ICD-10-CM

## 2023-08-13 ENCOUNTER — Ambulatory Visit: Payer: Medicare HMO | Admitting: Internal Medicine

## 2023-08-19 ENCOUNTER — Ambulatory Visit (INDEPENDENT_AMBULATORY_CARE_PROVIDER_SITE_OTHER): Payer: Medicare HMO

## 2023-08-19 DIAGNOSIS — Z91038 Other insect allergy status: Secondary | ICD-10-CM

## 2023-08-20 ENCOUNTER — Encounter: Payer: Self-pay | Admitting: Internal Medicine

## 2023-08-20 ENCOUNTER — Ambulatory Visit (INDEPENDENT_AMBULATORY_CARE_PROVIDER_SITE_OTHER): Payer: Medicare HMO | Admitting: Internal Medicine

## 2023-08-20 VITALS — BP 130/70 | HR 77 | Temp 98.0°F | Resp 16 | Ht 68.5 in | Wt 233.8 lb

## 2023-08-20 DIAGNOSIS — E66812 Obesity, class 2: Secondary | ICD-10-CM

## 2023-08-20 DIAGNOSIS — E559 Vitamin D deficiency, unspecified: Secondary | ICD-10-CM

## 2023-08-20 DIAGNOSIS — E1122 Type 2 diabetes mellitus with diabetic chronic kidney disease: Secondary | ICD-10-CM

## 2023-08-20 DIAGNOSIS — I700 Atherosclerosis of aorta: Secondary | ICD-10-CM

## 2023-08-20 DIAGNOSIS — N182 Chronic kidney disease, stage 2 (mild): Secondary | ICD-10-CM

## 2023-08-20 DIAGNOSIS — I10 Essential (primary) hypertension: Secondary | ICD-10-CM | POA: Diagnosis not present

## 2023-08-20 DIAGNOSIS — Z6836 Body mass index (BMI) 36.0-36.9, adult: Secondary | ICD-10-CM | POA: Diagnosis not present

## 2023-08-20 DIAGNOSIS — Z79899 Other long term (current) drug therapy: Secondary | ICD-10-CM | POA: Diagnosis not present

## 2023-08-20 DIAGNOSIS — E1169 Type 2 diabetes mellitus with other specified complication: Secondary | ICD-10-CM | POA: Diagnosis not present

## 2023-08-20 DIAGNOSIS — E785 Hyperlipidemia, unspecified: Secondary | ICD-10-CM | POA: Diagnosis not present

## 2023-08-20 MED ORDER — OZEMPIC (2 MG/DOSE) 8 MG/3ML ~~LOC~~ SOPN: PEN_INJECTOR | SUBCUTANEOUS | 9 refills | Status: DC

## 2023-08-20 NOTE — Patient Instructions (Signed)

## 2023-08-20 NOTE — Progress Notes (Unsigned)
Future Appointments  Date Time Provider Department  08/11/2023                    3 mo ov 11:30 AM Lucky Cowboy, MD [facility]  08/27/2023  2:45 PM Dillingham, Alena Bills, DO [facility]  09/13/2023 10:00 AM Ellamae Sia, DO [facility]  09/28/2023  3:15 PM Dillingham, Alena Bills, DO [facility]  11/02/2023                    wellness 10:00 AM Raynelle Dick, NP [facility]  05/22/2024                     cpe   2:00 PM Lucky Cowboy, MD [facility]    History of Present Illness:       This very nice 67 y.o. MWM  with HTN, HLD, Pre-Diabetes and Vitamin D Deficiency presents for 3 month follow up. Chest CT scan in Oct 2020 showed Aortic Atherosclerosis. Patient has Morbid Obesity  with BMI 35.03  contributing to his HTN, Diabetes  & Hyperlipidemia.         Patient is treated for HTN & BP has been controlled at home. Today's BP is at goal -  130/70 . Patient has had no complaints of any cardiac type chest pain, palpitations, dyspnea [name] /PND, dizziness, claudication or dependent edema.        Hyperlipidemia is controlled with diet & meds. Patient denies myalgias or other med SE's. Last Lipids were at goal except elevated Trig's :  Lab Results  Component Value Date   CHOL 162 05/10/2023   HDL 40 05/10/2023   LDLCALC 94 05/10/2023   TRIG 184 (H) 05/10/2023   CHOLHDL 4.1 05/10/2023     Also, the patient has history of PreDiabetes & Insulin Resistance and in May 2023 his A1c was 6.5% entering into T2_DM  and he is currently only on Trulicity injections.  He has had no symptoms of reactive hypoglycemia, diabetic polys, paresthesias or visual blurring.  Last A1c was near goal :  Lab Results  Component Value Date   HGBA1C 6.0 (H) 05/10/2023   Wt Readings from Last 3 Encounters:  08/20/23 233 lb 12.8 oz (106.1 kg)  07/28/23 232 lb 6.4 oz (105.4 kg)  05/12/23 236 lb 6.4 oz (107.2 kg)                                                          Further, the patient also has  history of Vitamin D Deficiency and supplements vitamin D without any suspected side-effects. Last vitamin D was still slightly low :  Lab Results  Component Value Date   VD25OH 53 02/18/2022       Current Outpatient Medications  Medication Instructions   albuterol HFA inhaler USE 2 INHALATIONS  EVERY 4 HOURS AS NEEDED TO RESCUE ASTHMA   Albuterol-Budesonide (AIRSUPRA) 90-80 MCG/ACT AERO 2 puffs Every 4 hours PRN, Do not exceed 12 puffs in 24 hours.   azelastine (ASTELIN) 0.1 % nasal spray 1-2 sprays times daily PRN, Use in each nostril   bisoprolol (ZEBETA) 5 mg, Oral, Daily   cetirizine (ZYRTEC ALLERGY) 10 mg, Oral, Daily   Cholecalciferol (VITAMIN D) 125 MCG (5000 UT) CAPS Oral   ciprofloxacin (CILOXAN) 0.3 % ophthalmic  solution 1 drop, Both Eyes, Daily PRN   Dulaglutide (TRULICITY) 4.5 MG/0.5ML SOPN Inject 4.5 mg into Skin every 7 days   EPINEPHrine (EPIPEN 2-PAK) 0.3 mg, Intramuscular, As needed   escitalopram (LEXAPRO) 10 MG tablet Take 1 tablet Daily for Mood   fenofibrate 134 MG capsule TAKE 1 CAPSULE  DAILY    fluocinonide (LIDEX) 0.05 % external solution 1 Application, Topical, Daily PRN   fluticasone (FLONASE) 50 MCG/ACT nasal spray 1 spray, Each Nare, 2 times daily PRN   BREO ELLIPTA 200-25 MCG/ACT AEPB 1 puff, Inhalation, Daily   HYDROcodone-acetaminophen  5-325 MG tablet Take 1/2 to 1 tablet every 3 to 4 hours as needed    ketoconazole (NIZORAL) 2 % cream 1 Application, Topical, Daily PRN   meloxicam (MOBIC) 15 MG tablet TAKE 1/2 TO 1 TABLET DAILY   montelukast (SINGULAIR) 10 MG tablet Take  1 tablet  Daily   omeprazole (PRILOSEC) 20 mg, Oral, Daily   phentermine  37.5 MG tablet TAKE 1 TABLET EVERY MORNING    sildenafil (VIAGRA) 100 MG tablet TAKE ONE TABLET DAILY AS NEEDED   tamsulosin (FLOMAX) 0.4 MG CAPS capsule TAKE 1 CAPSULE AT BEDTIME    triamcinolone cream (KENALOG) 0.1 % 1 Application 2 times daily   valsartan-hctz 80-12.5 MG tablet 1 tablet, Daily, for blood  pressure     Allergies  Allergen Reactions   Bee Venom Anaphylaxis   Pine Anaphylaxis   Lisinopril Other (See Comments)    Patient intolerant of drug which caused his tongue to tingle.   Celebrex [Celecoxib]      PMHx:   Past Medical History:  Diagnosis Date   Arthritis    Asthma    Back pain    Chronic pain of both shoulders    Depression    Dyspnea    Elevated glucose    Environmental allergies    GERD (gastroesophageal reflux disease)    HLD (hyperlipidemia)    HTN (hypertension)    Insomnia    Obesity    OSA on CPAP 08/01/2014   Reflux    Trigger finger      Immunization History  Administered Date(s) Administered   Influenza Inj Mdck Quad With Preservative 08/19/2017, 07/25/2019   Influenza Split 08/06/2015   Influenza, High Dose Seasonal PF 08/07/2020, 07/15/2021   Influenza,inj,quad, With Preservative 08/20/2016, 07/20/2019   Moderna Sars-Covid-2 Vacc 01/04/2020, 01/25/2020   PFIZER  SARS-COV-2 Vacc 08/27/2020   PPD Test 06/03/2018, 11/28/2019   Pneumococcal -23 08/20/2016   Tdap 11/06/2016   Zoster, Live 11/06/2016     Past Surgical History:  Procedure Laterality Date   APPENDECTOMY  07/2012   bone spur removal     HEMORRHOID SURGERY  09/2012   HERNIA REPAIR      FHx:    Reviewed / unchanged  SHx:    Reviewed / unchanged   Systems Review:  Constitutional: Denies fever, chills, wt changes, headaches, insomnia, fatigue, night sweats, change in appetite. Eyes: Denies redness, blurred vision, diplopia, discharge, itchy, watery eyes.  ENT: Denies discharge, congestion, post nasal drip, epistaxis, sore throat, earache, hearing loss, dental pain, tinnitus, vertigo, sinus pain, snoring.  CV: Denies chest pain, palpitations, irregular heartbeat, syncope, dyspnea, diaphoresis, orthopnea, PND, claudication or edema. Respiratory: denies cough, dyspnea, DOE, pleurisy, hoarseness, laryngitis, wheezing.  Gastrointestinal: Denies dysphagia, odynophagia,  heartburn, reflux, water brash, abdominal pain or cramps, nausea, vomiting, bloating, diarrhea, constipation, hematemesis, melena, hematochezia  or hemorrhoids. Genitourinary: Denies dysuria, frequency, urgency, nocturia, hesitancy, discharge, hematuria or flank  pain. Musculoskeletal: Denies arthralgias, myalgias, stiffness, jt. swelling, pain, limping or strain/sprain.  Skin: Denies pruritus, rash, hives, warts, acne, eczema or change in skin lesion(s). Neuro: No weakness, tremor, incoordination, spasms, paresthesia or pain. Psychiatric: Denies confusion, memory loss or sensory loss. Endo: Denies change in weight, skin or hair change.  Heme/Lymph: No excessive bleeding, bruising or enlarged lymph nodes.  Physical Exam  BP 130/70   P  77   T 98 F    R  16   Ht 5' 8.5"    Wt 233 lb 12.8 oz   SpO2   96%   BMI 35.03 kg/m   Appears  over nourished, well groomed  and in no distress.  Eyes: PERRLA, EOMs, conjunctiva no swelling or erythema. Sinuses: No frontal/maxillary tenderness ENT/Mouth: EAC's clear, TM's nl w/o erythema, bulging. Nares clear w/o erythema, swelling, exudates. Oropharynx clear without erythema or exudates. Oral hygiene is good. Tongue normal, non obstructing. Hearing intact.  Neck: Supple. Thyroid not palpable. Car 2+/2+ without bruits, nodes or JVD. Chest: Respirations nl with BS clear & equal w/o rales, rhonchi, wheezing or stridor.  Cor: Heart sounds normal w/ regular rate and rhythm without sig. murmurs, gallops, clicks or rubs. Peripheral pulses normal and equal  without edema.  Abdomen: Soft & bowel sounds normal. Non-tender w/o guarding, rebound, hernias, masses or organomegaly.  Lymphatics: Unremarkable.  Musculoskeletal: Full ROM all peripheral extremities, joint stability, 5/5 strength and normal gait.  Skin: Warm, dry without exposed rashes, lesions or ecchymosis apparent.  Neuro: Cranial nerves intact, reflexes equal bilaterally. Sensory-motor testing  grossly intact. Tendon reflexes grossly intact.  Pysch: Alert & oriented x 3.  Insight and judgement nl & appropriate. No ideations.   Assessment and Plan:   1. Class 2 severe obesity due to excess calories with serious comorbidity  (HTN. HLD & T2_DM)  and body mass index (BMI) of 37.0 to 37.9 in adult (HCC)   2. Essential hypertension  - Continue medication, monitor blood pressure at home.  - Continue DASH diet.  Reminder to go to the ER if any CP,  SOB, nausea, dizziness, severe HA, changes vision/speech.   - CBC with Differential/Platelet - COMPLETE METABOLIC PANEL WITH GFR - Magnesium - TSH   3. Hyperlipidemia associated with type 2 diabetes mellitus (HCC)  - Continue diet/meds, exercise,& lifestyle modifications.  - Continue monitor periodic cholesterol/liver & renal functions     - Lipid panel - TSH   4. Type 2 diabetes mellitus with stage 2 chronic kidney  disease, without long-term current use of insulin (HCC)  - Continue diet, exercise  - Lifestyle modifications.  - Monitor appropriate labs    - Hemoglobin  A1c =   6.0%   today  - Insulin, random   5. Vitamin D deficiency    - Continue supplementation    - VITAMIN D 25 Hydroxy    6. Thoracic aortic atherosclerosis (HCC) by Chest ct SCAN on 08/09/2019  - Lipid panel   7. Medication management  - CBC with Differential/Platelet - COMPLETE METABOLIC PANEL WITH GFR - Magnesium - Lipid panel - TSH - Hemoglobin A1c - Insulin, random - VITAMIN D 25 Hydroxy       Patient has surgery scheduled for R Hallux MPJ arthrodesis and is felt low risk for anticipated surgery. He may stop his Trulicity for 10 days preceding surgery.          Discussed  regular exercise, BP monitoring, weight control to achieve/maintain BMI less than 25 and  discussed med and SE's. Recommended labs to assess and monitor clinical status with further disposition pending results of labs.  I discussed the assessment and treatment  plan with the patient. The patient was provided an opportunity to ask questions and all were answered. The patient agreed with the plan and demonstrated an understanding of the instructions.  I provided over 30 minutes of exam, counseling, chart review and  complex critical decision making.        The patient was advised to call back or seek an in-person evaluation if the symptoms worsen or if the condition fails to improve as anticipated.   Marinus Maw, MD

## 2023-08-21 NOTE — Progress Notes (Signed)
<>*<>*<>*<>*<>*<>*<>*<>*<>*<>*<>*<>*<>*<>*<>*<>*<>*<>*<>*<>*<>*<>*<>*<>*<> <>*<>*<>*<>*<>*<>*<>*<>*<>*<>*<>*<>*<>*<>*<>*<>*<>*<>*<>*<>*<>*<>*<>*<>*<>  -Test results slightly outside the reference range are not unusual. If there is anything important, I will review this with you,  otherwise it is considered normal test values.  If you have further questions,  please do not hesitate to contact me at the office or via My Chart.   <>*<>*<>*<>*<>*<>*<>*<>*<>*<>*<>*<>*<>*<>*<>*<>*<>*<>*<>*<>*<>*<>*<>*<>*<> <>*<>*<>*<>*<>*<>*<>*<>*<>*<>*<>*<>*<>*<>*<>*<>*<>*<>*<>*<>*<>*<>*<>*<>*<>  -  Total  Chol =   167   - Excellent  !              (  Ideal  or  Goal is less than 180  !  )  But   -  Bad / Dangerous LDL  Chol =  101 - Elevated              (  Ideal  or  Goal is less than 70  !  )   - So - Recommend a   STRICTER   low cholesterol diet    - Cholesterol only comes from animal sources                                                                       \ - ie. meat, dairy, egg yolks  - Eat all the vegetables you want.  - Avoid Meat, Avoid Meat,  Avoid Meat                                                    - especially Red Meat - Beef AND Pork .  - Avoid cheese & dairy - milk & ice cream.     - Cheese is the most concentrated form of trans-fats which                                                       is the worst thing to clog up our arteries.    - Veggie cheese is OK which can be found in the fresh                      produce section at [employer] or [employer] or [employer]  <>*<>*<>*<>*<>*<>*<>*<>*<>*<>*<>*<>*<>*<>*<>*<>*<>*<>*<>*<>*<>*<>*<>*<>*<> <>*<>*<>*<>*<>*<>*<>*<>*<>*<>*<>*<>*<>*<>*<>*<>*<>*<>*<>*<>*<>*<>*<>*<>*<>  -  Also,  Triglycerides = (  190 )   fats in blood are too high                 (   Ideal or  Goal is less than 150  !  )    - Recommend avoid fried & greasy foods,  sweets / candy,   - Avoid white rice  (brown or wild rice or Quinoa is OK),   -  Avoid white potatoes  (sweet potatoes are OK)   - Avoid anything made from white flour  - bagels, doughnuts, rolls, buns, biscuits, white and   wheat breads, pizza crust and traditional pasta made of white flour & egg white  - (vegetarian pasta or spinach or wheat pasta is OK).    -  Multi-grain bread is OK - like multi-grain flat bread or                                                                                                                                                                                                                                                                                                                                                                                                                                                                                                                                                                                                        sandwich thins.   - Avoid alcohol  in excess.   - Exercise is also important.  <>*<>*<>*<>*<>*<>*<>*<>*<>*<>*<>*<>*<>*<>*<>*<>*<>*<>*<>*<>*<>*<>*<>*<>*<> <>*<>*<>*<>*<>*<>*<>*<>*<>*<>*<>*<>*<>*<>*<>*<>*<>*<>*<>*<>*<>*<>*<>*<>*<>  -  A1c = 6.0% Blood sugar and A1c are   STILL  elevated in the borderline and  early or pre-diabetes range which has the same   300% increased risk for heart attack, stroke, cancer and                                                                                                                                                                                        alzheimer- type vascular dementia as full blown diabetes.   But the good news is that diet, exercise with  weight loss can cure the early diabetes at this point.  <>*<>*<>*<>*<>*<>*<>*<>*<>*<>*<>*<>*<>*<>*<>*<>*<>*<>*<>*<>*<>*<>*<>*<>*<> <>*<>*<>*<>*<>*<>*<>*<>*<>*<>*<>*<>*<>*<>*<>*<>*<>*<>*<>*<>*<>*<>*<>*<>*<>  -  Vitamin D = 37 is Exteremly & Dangerously  LOW   - Vitamin D goal is between 70-100.   - Please make sure that you are taking your Vitamin D as directed.   - It is very important as a natural anti-inflammatory and helping the                           immune system protect against viral infections, like the Covid-19    helping hair, skin, and nails, as well as reducing stroke and heart attack risk.   - It helps your bones and helps with mood.  - It also decreases numerous cancer risks so please                                                                                           take it as directed.   - Low Vit D is associated with a 200-300% higher risk for CANCER   and 200-300% higher risk for HEART   ATTACK  &  STROKE.    - It is also associated with higher death rate at younger ages,   autoimmune diseases like Rheumatoid arthritis, Lupus, Multiple Sclerosis.     - Also many other serious conditions, like depression, Alzheimer's  Dementia,  muscle aches, fatigue, fibromyalgia    <>*<>*<>*<>*<>*<>*<>*<>*<>*<>*<>*<>*<>*<>*<>*<>*<>*<>*<>*<>*<>*<>*<>*<>*<> <>*<>*<>*<>*<>*<>*<>*<>*<>*<>*<>*<>*<>*<>*<>*<>*<>*<>*<>*<>*<>*<>*<>*<>*<>  -  All Else - CBC - Kidneys - Electrolytes - Liver - Magnesium & Thyroid    - all  Normal / OK  <>*<>*<>*<>*<>*<>*<>*<>*<>*<>*<>*<>*<>*<>*<>*<>*<>*<>*<>*<>*<>*<>*<>*<>*<> <>*<>*<>*<>*<>*<>*<>*<>*<>*<>*<>*<>*<>*<>*<>*<>*<>*<>*<>*<>*<>*<>*<>*<>*<>

## 2023-08-22 LAB — COMPLETE METABOLIC PANEL WITH GFR
AG Ratio: 1.8 (calc) (ref 1.0–2.5)
ALT: 27 U/L (ref 9–46)
AST: 19 U/L (ref 10–35)
Albumin: 4.7 g/dL (ref 3.6–5.1)
Alkaline phosphatase (APISO): 83 U/L (ref 35–144)
BUN: 19 mg/dL (ref 7–25)
CO2: 30 mmol/L (ref 20–32)
Calcium: 9.7 mg/dL (ref 8.6–10.3)
Chloride: 101 mmol/L (ref 98–110)
Creat: 0.9 mg/dL (ref 0.70–1.35)
Globulin: 2.6 g/dL (ref 1.9–3.7)
Glucose, Bld: 106 mg/dL — ABNORMAL HIGH (ref 65–99)
Potassium: 4.4 mmol/L (ref 3.5–5.3)
Sodium: 140 mmol/L (ref 135–146)
Total Bilirubin: 0.7 mg/dL (ref 0.2–1.2)
Total Protein: 7.3 g/dL (ref 6.1–8.1)
eGFR: 93 mL/min/{1.73_m2} (ref >=60)

## 2023-08-22 LAB — CBC WITH DIFFERENTIAL/PLATELET
Absolute Lymphocytes: 2045 {cells}/uL (ref 850–3900)
Absolute Monocytes: 504 {cells}/uL (ref 200–950)
Basophils Absolute: 43 {cells}/uL (ref 0–200)
Basophils Relative: 0.6 %
Eosinophils Absolute: 114 {cells}/uL (ref 15–500)
Eosinophils Relative: 1.6 %
HCT: 43.3 % (ref 38.5–50.0)
Hemoglobin: 14.7 g/dL (ref 13.2–17.1)
MCH: 31.8 pg (ref 27.0–33.0)
MCHC: 33.9 g/dL (ref 32.0–36.0)
MCV: 93.7 fL (ref 80.0–100.0)
MPV: 10.3 fL (ref 7.5–12.5)
Monocytes Relative: 7.1 %
Neutro Abs: 4395 {cells}/uL (ref 1500–7800)
Neutrophils Relative %: 61.9 %
Platelets: 238 10*3/uL (ref 140–400)
RBC: 4.62 10*6/uL (ref 4.20–5.80)
RDW: 12.5 % (ref 11.0–15.0)
Total Lymphocyte: 28.8 %
WBC: 7.1 10*3/uL (ref 3.8–10.8)

## 2023-08-22 LAB — LIPID PANEL
Cholesterol: 167 mg/dL (ref <200)
HDL: 38 mg/dL — ABNORMAL LOW (ref >=40)
LDL Cholesterol (Calc): 101 mg/dL — ABNORMAL HIGH
Non-HDL Cholesterol (Calc): 129 mg/dL (ref <130)
Total CHOL/HDL Ratio: 4.4 (calc) (ref <5.0)
Triglycerides: 190 mg/dL — ABNORMAL HIGH (ref <150)

## 2023-08-22 LAB — MAGNESIUM: Magnesium: 2.1 mg/dL (ref 1.5–2.5)

## 2023-08-22 LAB — HEMOGLOBIN A1C
Hgb A1c MFr Bld: 6 %{Hb} — ABNORMAL HIGH (ref <5.7)
Mean Plasma Glucose: 126 mg/dL
eAG (mmol/L): 7 mmol/L

## 2023-08-22 LAB — TSH: TSH: 1.67 m[IU]/L (ref 0.40–4.50)

## 2023-08-22 LAB — INSULIN, RANDOM: Insulin: 31.1 u[IU]/mL — ABNORMAL HIGH

## 2023-08-22 LAB — VITAMIN D 25 HYDROXY (VIT D DEFICIENCY, FRACTURES): Vit D, 25-Hydroxy: 37 ng/mL (ref 30–100)

## 2023-08-24 ENCOUNTER — Other Ambulatory Visit (HOSPITAL_COMMUNITY): Payer: Self-pay | Admitting: Orthopedic Surgery

## 2023-08-27 ENCOUNTER — Other Ambulatory Visit: Payer: Medicare HMO | Admitting: Plastic Surgery

## 2023-08-27 ENCOUNTER — Ambulatory Visit: Payer: Medicare HMO

## 2023-08-31 ENCOUNTER — Ambulatory Visit (INDEPENDENT_AMBULATORY_CARE_PROVIDER_SITE_OTHER): Payer: Self-pay | Admitting: Plastic Surgery

## 2023-08-31 ENCOUNTER — Ambulatory Visit (INDEPENDENT_AMBULATORY_CARE_PROVIDER_SITE_OTHER): Payer: Medicare HMO

## 2023-08-31 ENCOUNTER — Encounter: Payer: Self-pay | Admitting: Plastic Surgery

## 2023-08-31 DIAGNOSIS — Z91038 Other insect allergy status: Secondary | ICD-10-CM | POA: Diagnosis not present

## 2023-08-31 DIAGNOSIS — L719 Rosacea, unspecified: Secondary | ICD-10-CM

## 2023-08-31 NOTE — Progress Notes (Signed)
Preoperative Dx: Rosacea and rhinophyma  Postoperative Dx:  same  Procedure: laser to face   Anesthesia: none  Description of Procedure:  Risks and complications were explained to the patient. Consent was confirmed and signed. Eye protection was placed. Time out was called and all information was confirmed to be correct. The area  area was prepped with alcohol and wiped dry. The Heroic laser was set at 532 nm at preset J/cm2. The face was lasered. The patient tolerated the procedure well and there were no complications. The patient is to follow up in 4 weeks.

## 2023-09-02 DIAGNOSIS — G4733 Obstructive sleep apnea (adult) (pediatric): Secondary | ICD-10-CM | POA: Diagnosis not present

## 2023-09-05 ENCOUNTER — Other Ambulatory Visit: Payer: Self-pay | Admitting: Internal Medicine

## 2023-09-05 ENCOUNTER — Other Ambulatory Visit: Payer: Self-pay | Admitting: Nurse Practitioner

## 2023-09-05 MED ORDER — MONTELUKAST SODIUM 10 MG PO TABS: ORAL_TABLET | ORAL | 3 refills | Status: DC

## 2023-09-07 ENCOUNTER — Ambulatory Visit: Payer: Medicare HMO

## 2023-09-08 ENCOUNTER — Ambulatory Visit (INDEPENDENT_AMBULATORY_CARE_PROVIDER_SITE_OTHER): Payer: Medicare HMO | Admitting: *Deleted

## 2023-09-08 DIAGNOSIS — Z91038 Other insect allergy status: Secondary | ICD-10-CM

## 2023-09-13 ENCOUNTER — Ambulatory Visit: Payer: Medicare HMO | Admitting: Allergy

## 2023-09-13 NOTE — Progress Notes (Deleted)
Follow Up Note  RE: George Montgomery MRN: [id number] DOB: 1956/03/17 Date of Office Visit: 09/13/2023  Referring provider: Lucky Cowboy, MD Primary care provider: Lucky Cowboy, MD  Chief Complaint: No chief complaint on file.  History of Present Illness: I had the pleasure of seeing George Montgomery for a follow up visit at the [facility] on 09/13/2023. He is a 67 y.o. 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 His previous allergy office visit was on 05/12/2023 with Dr. Selena Batten. Today is a regular follow up visit.  Discussed the use of AI scribe software for clinical note transcription with the patient, who gave verbal consent to proceed.  History of Present Illness            Hymenoptera allergy Past history - Went to ER few times after stings due to hives and swelling. Never required Epi. 2023 bloodwork positive to honeybee, white faced hornet, yellow jacket, wasp, yellow hornet and bumblebee. Interim history - started VIT on 12/30/2022 (MV+HB+W) with no issues.  Continue to avoid. Continue venom injections - given today. For mild symptoms you can take over the counter antihistamines such as Benadryl and monitor symptoms closely. If symptoms worsen or if you have severe symptoms including breathing issues, throat closure, significant swelling, whole body hives, severe diarrhea and vomiting, lightheadedness then inject epinephrine and seek immediate medical care afterwards. Action plan in place.    Moderate persistent asthma without complication Past history - Gets wheezing, coughing and shortness of breath mainly during URIs. Uses albuterol rarely but during the last URI needed to add on Trelegy for 2 weeks. 2023 CXR unremarkable. 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 Clinically  feeling slightly improved.  Interim history - uses Breo for a few days at a time in a month. Confused about instructions. Today's spirometry was unremarkable.  Daily controller medication(s): start Breo 1 puff once a day and rinse mouth after each use.  Rinse mouth with Listerine alcohol free mouthwash. If not covered let us know. May use albuterol rescue inhaler 2 puffs or nebulizer every 4 to 6 hours as needed for shortness of breath, chest tightness, coughing, and wheezing. May use albuterol rescue inhaler 2 puffs 5 to 15 minutes prior to strenuous physical activities. Monitor frequency of use - if you need to use it more than twice per week on a consistent basis let us know.    Seasonal and perennial allergic rhinitis Past history - Perennial rhinoconjunctivitis symptoms for many years mainly in the spring and fall.  Tried Singulair, Zyrtec and Flonase with some benefit.  No prior ENT evaluation.  2023 CT sinus showed mild mucosal thickening. 2023 skin testing showed: Positive to dust mites, mold and cockroach. 2023 bloodwork positive to dust mites, grass, ragweed.  Borderline to cat, cockroach, tree, weed pollen. Interim history - some symptoms when outdoors. Reviewed nasal sprays. Continue environmental control measures as below. Continue Singulair (montelukast) 10mg  daily at night. Use over the counter antihistamines such as Zyrtec (cetirizine), Claritin (loratadine), Allegra (fexofenadine), or Xyzal (levocetirizine) daily as needed. May take twice a day during allergy flares. May switch antihistamines every few months. Use Flonase (fluticasone) nasal spray 1 spray per nostril twice a day as needed for nasal congestion.  Use azelastine or ipratropium nasal spray 1-2 sprays per nostril twice a day as needed for  runny nose/drainage. Nasal saline spray (i.e., Simply Saline) or nasal saline lavage (i.e., NeilMed) is recommended as needed and prior to medicated nasal sprays. Start allergy  injections - once done with venom build up. Had a detailed discussion with patient/family that clinical history is suggestive of allergic rhinitis, and may benefit from allergy immunotherapy (AIT). Discussed in detail regarding the dosing, schedule, side effects (mild to moderate local allergic reaction and rarely systemic allergic reactions including anaphylaxis), and benefits (significant improvement in nasal symptoms, seasonal flares of asthma) of immunotherapy with the patient. There is significant time commitment involved with allergy shots, which includes weekly immunotherapy injections for first 9-12 months and then biweekly to monthly injections for 3-5 years. Consent was signed.   Anaphylactic reaction due to food, subsequent encounter Past history - Upper body itching after eating mixed nuts in December 2023. Has eaten peanut butter since then with no issues. 2023 skin testing showed: Positive to [place] nuts. 2023 bloodwork positive to [place] nuts, borderline to walnuts, pecan and pistachio.  More likely to have anaphylactic reaction to [place] nuts, walnuts. Interim history - had alpha gal testing due to vomiting/diarrhea after starting Mounjaro. Previously tolerated with no issues. Continue strict avoidance of tree nuts. Okay with eating peanuts.  Okay to reintroduce red meat into diet. Bloodwork was borderline positive only and I doubt it was due to alpha gal allergy. If having issues then stop and let us know. For mild symptoms you can take over the counter antihistamines such as Benadryl and monitor symptoms closely. If symptoms worsen or if you have severe symptoms including breathing issues, throat closure, significant swelling, whole body hives, severe diarrhea and vomiting, lightheadedness then inject epinephrine and seek immediate medical care afterwards.   Penicillin allergy Consider penicillin allergy skin testing and in office drug challenge in the future.  Over 90% of people with  history of penicillin allergy which occurred over 10 years ago are found to be non-allergic.     Assessment and Plan: George Montgomery is a 67 y.o. male with: Hymenoptera allergy Past history - Went to ER few times after stings due to hives and swelling. Never required Epi. 2023 bloodwork positive to honeybee, white faced hornet, yellow jacket, wasp, yellow hornet and bumblebee. Interim history - started VIT on 12/30/2022 (MV+HB+W) with no issues.  Continue to avoid. Continue venom injections - given today. For mild symptoms you can take over the counter antihistamines such as Benadryl and monitor symptoms closely. If symptoms worsen or if you have severe symptoms including breathing issues, throat closure, significant swelling, whole body hives, severe diarrhea and vomiting, lightheadedness then inject epinephrine and seek immediate medical care afterwards. Action plan in place.    Moderate persistent asthma without complication Past history - Gets wheezing, coughing and shortness of breath mainly during URIs. Uses albuterol rarely but during the last URI needed to add on Trelegy for 2 weeks. 2023 CXR unremarkable. 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 Clinically feeling slightly improved.  Interim history - uses Breo for a few days at a time in a month. Confused about instructions. Today's spirometry was unremarkable.  Daily controller medication(s): start Breo 1 puff once a day and rinse mouth after each use.  Rinse mouth with Listerine alcohol free mouthwash. If not covered let us know. May use albuterol rescue inhaler 2 puffs or nebulizer every 4 to 6 hours as needed for shortness of breath, chest tightness,  coughing, and wheezing. May use albuterol rescue inhaler 2 puffs 5 to 15 minutes prior to strenuous physical activities. Monitor frequency of use - if you need to use it more than twice per  week on a consistent basis let us know.    Seasonal and perennial allergic rhinitis Past history - Perennial rhinoconjunctivitis symptoms for many years mainly in the spring and fall.  Tried Singulair, Zyrtec and Flonase with some benefit.  No prior ENT evaluation.  2023 CT sinus showed mild mucosal thickening. 2023 skin testing showed: Positive to dust mites, mold and cockroach. 2023 bloodwork positive to dust mites, grass, ragweed.  Borderline to cat, cockroach, tree, weed pollen. Interim history - some symptoms when outdoors. Reviewed nasal sprays. Continue environmental control measures as below. Continue Singulair (montelukast) 10mg  daily at night. Use over the counter antihistamines such as Zyrtec (cetirizine), Claritin (loratadine), Allegra (fexofenadine), or Xyzal (levocetirizine) daily as needed. May take twice a day during allergy flares. May switch antihistamines every few months. Use Flonase (fluticasone) nasal spray 1 spray per nostril twice a day as needed for nasal congestion.  Use azelastine or ipratropium nasal spray 1-2 sprays per nostril twice a day as needed for runny nose/drainage. Nasal saline spray (i.e., Simply Saline) or nasal saline lavage (i.e., NeilMed) is recommended as needed and prior to medicated nasal sprays. Start allergy injections - once done with venom build up. Had a detailed discussion with patient/family that clinical history is suggestive of allergic rhinitis, and may benefit from allergy immunotherapy (AIT). Discussed in detail regarding the dosing, schedule, side effects (mild to moderate local allergic reaction and rarely systemic allergic reactions including anaphylaxis), and benefits (significant improvement in nasal symptoms, seasonal flares of asthma) of immunotherapy with the patient. There is significant time commitment involved with allergy shots, which includes weekly immunotherapy injections for first 9-12 months and then biweekly to monthly injections  for 3-5 years. Consent was signed.   Anaphylactic reaction due to food, subsequent encounter Past history - Upper body itching after eating mixed nuts in December 2023. Has eaten peanut butter since then with no issues. 2023 skin testing showed: Positive to [place] nuts. 2023 bloodwork positive to [place] nuts, borderline to walnuts, pecan and pistachio.  More likely to have anaphylactic reaction to [place] nuts, walnuts. Interim history - had alpha gal testing due to vomiting/diarrhea after starting Mounjaro. Previously tolerated with no issues. Continue strict avoidance of tree nuts. Okay with eating peanuts.  Okay to reintroduce red meat into diet. Bloodwork was borderline positive only and I doubt it was due to alpha gal allergy. If having issues then stop and let us know. For mild symptoms you can take over the counter antihistamines such as Benadryl and monitor symptoms closely. If symptoms worsen or if you have severe symptoms including breathing issues, throat closure, significant swelling, whole body hives, severe diarrhea and vomiting, lightheadedness then inject epinephrine and seek immediate medical care afterwards.   Penicillin allergy Consider penicillin allergy skin testing and in office drug challenge in the future.  Over 90% of people with history of penicillin allergy which occurred over 10 years ago are found to be non-allergic.    Assessment and Plan              No follow-ups on file.  No orders of the defined types were placed in this encounter.  Lab Orders  No laboratory test(s) ordered today    Diagnostics: Spirometry:  Tracings reviewed. His effort: {Blank single:19197::"Good reproducible efforts.","It was hard to  get consistent efforts and there is a question as to whether this reflects a maximal maneuver.","Poor effort, data can not be interpreted."} FVC: ***L FEV1: ***L, ***% predicted FEV1/FVC ratio: ***% Interpretation: {Blank single:19197::"Spirometry  consistent with mild obstructive disease","Spirometry consistent with moderate obstructive disease","Spirometry consistent with severe obstructive disease","Spirometry consistent with possible restrictive disease","Spirometry consistent with mixed obstructive and restrictive disease","Spirometry uninterpretable due to technique","Spirometry consistent with normal pattern","No overt abnormalities noted given today's efforts"}.  Please see scanned spirometry results for details.  Skin Testing: {Blank single:19197::"Select foods","Environmental allergy panel","Environmental allergy panel and select foods","Food allergy panel","None","Deferred due to recent antihistamines use"}. *** Results discussed with patient/family.   Medication List:  Current Outpatient Medications  Medication Sig Dispense Refill   albuterol (VENTOLIN HFA) 108 (90 Base) MCG/ACT inhaler USE 2 INHALATIONS 15-20 MINUTES APART EVERY 4 HOURS AS NEEDED TO RESCUE ASTHMA 48 g 3   bisoprolol (ZEBETA) 10 MG tablet Take 1 tablet by mouth daily. (Patient not taking: Reported on 08/20/2023)     cetirizine (ZYRTEC ALLERGY) 10 MG tablet Take 1 tablet (10 mg total) by mouth daily. 30 tablet 0   cholestyramine (QUESTRAN) 4 g packet Take 1 packet by mouth 2 (two) times daily for 15 days. 30 each 0   EPINEPHrine (EPIPEN 2-PAK) 0.3 mg/0.3 mL IJ SOAJ injection Inject 0.3 mg into the muscle as needed for anaphylaxis. 2 each 2   escitalopram (LEXAPRO) 10 MG tablet Take  1 tablet  Daily for Mood                                                              /                                                                   TAKE                                         BY                                                 MOUTH 90 tablet 3   fenofibrate micronized (LOFIBRA) 134 MG capsule Take 1 capsule Daily for Triglycerides ( Blood Fats)                                                 /  TAKE                                          BY                                                 MOUTH                     ONCE ? ? ?  DAILY 90 capsule 3   fexofenadine (ALLEGRA) 180 MG tablet Take by mouth.     fluconazole (DIFLUCAN) 150 MG tablet Take 1 tab day 1 and second tab on day 4 (Patient not taking: Reported on 08/20/2023) 12 tablet 0   fluticasone (FLONASE) 50 MCG/ACT nasal spray Place 1 spray into both nostrils 2 (two) times daily as needed (nasal congestion). 16 g 5   fluticasone furoate-vilanterol (BREO ELLIPTA) 200-25 MCG/ACT AEPB Inhale 1 puff into the lungs daily. Rinse mouth after each use. 60 each 3   ipratropium (ATROVENT) 0.06 % nasal spray Use 1 to 2 sprays each nostril 2 to 3 x /day as needed 45 mL 3   meloxicam (MOBIC) 15 MG tablet Take 1/2 to 1 tablet Daily with Food for Pain & Inflammation                                                                   /                                   TAKE                                 BY                                    MOUTH                               ONCE  ?  ?  ?  DAILY 90 tablet 3   montelukast (SINGULAIR) 10 MG tablet Take  1 tablet  Daily for Allergies                               /                                                                   TAKE  BY                                                 MOUTH ONCE ? ? ?  DAILY 90 tablet 3   nystatin (MYCOSTATIN) 100000 UNIT/ML suspension Take 5 mLs (500,000 Units total) by mouth 4 (four) times daily. Swish and swallow. 60 mL 0   omeprazole (PRILOSEC) 20 MG capsule Take  1 capsule  Daily to Prevent Heartburn & Indigestion                                   /                                                                   TAKE                                         BY                                                 MOUTH 90 capsule 3   ondansetron (ZOFRAN) 8 MG tablet Take  1/2 to 1 tablet   3 x / day  for Nausea 90 tablet 2   phentermine  (ADIPEX-P) 37.5 MG tablet TAKE 1 TABLET EVERY MORNING FOR DIETING & WEIGHT LOSS 90 tablet 1   promethazine-dextromethorphan (PROMETHAZINE-DM) 6.25-15 MG/5ML syrup Take 5 mLs by mouth 4 (four) times daily as needed for cough. (Patient not taking: Reported on 08/20/2023) 240 mL 1   Semaglutide, 2 MG/DOSE, (OZEMPIC, 2 MG/DOSE,) 8 MG/3ML SOPN Inject 2 mg (0.75 ml)   into Skin  every 7 to 12 days for Diabetes  (Dx:  e11.29) 9 mL 9   sildenafil (VIAGRA) 100 MG tablet Take   1/2 to 1 tablet   Daily  as needed for XXXX 30 tablet 1   tamsulosin (FLOMAX) 0.4 MG CAPS capsule TAKE 1 CAPSULE BY MOUTH AT BEDTIME FOR PROSTATE 90 capsule 0   valsartan-hydrochlorothiazide (DIOVAN-HCT) 80-12.5 MG tablet Take  1 tablet  Daily  for BP 90 tablet 3   Zinc 50 MG TABS Take by mouth.     No current facility-administered medications for this visit.   Allergies: Allergies  Allergen Reactions   Bee Venom Anaphylaxis   Pine Anaphylaxis   [place] Nut (Berthollefia Excelsa)     pruritus   Lisinopril Other (See Comments)    Patient intolerant of drug which caused his tongue to tingle.   Penicillins Itching   Celebrex [Celecoxib]    I reviewed his past medical history, social history, family history, and environmental history and no significant changes have been reported from his previous visit.  Review of Systems  Constitutional:  Negative for appetite change, chills, fever and unexpected weight change.  HENT:  Positive for congestion, postnasal drip and rhinorrhea.  Eyes:  Negative for itching.  Respiratory:  Negative for chest tightness, shortness of breath and wheezing.   Cardiovascular:  Negative for chest pain.  Gastrointestinal:  Negative for abdominal pain.  Genitourinary:  Negative for difficulty urinating.  Skin:  Negative for rash.  Allergic/Immunologic: Positive for environmental allergies and food allergies.    Objective: There were no vitals taken for this visit. There is no height or weight on  file to calculate BMI. Physical Exam Vitals and nursing note reviewed.  Constitutional:      Appearance: Normal appearance. He is well-developed. He is obese.  HENT:     Head: Normocephalic and atraumatic.     Right Ear: Tympanic membrane and external ear normal.     Left Ear: Tympanic membrane and external ear normal.     Nose: Nose normal.     Mouth/Throat:     Mouth: Mucous membranes are moist.     Pharynx: Oropharynx is clear.  Eyes:     Conjunctiva/sclera: Conjunctivae normal.  Cardiovascular:     Rate and Rhythm: Normal rate and regular rhythm.     Heart sounds: Normal heart sounds. No murmur heard. Pulmonary:     Effort: Pulmonary effort is normal.     Breath sounds: Normal breath sounds. No wheezing, rhonchi or rales.  Musculoskeletal:     Cervical back: Neck supple.  Skin:    General: Skin is warm.     Findings: No rash.  Neurological:     Mental Status: He is alert and oriented to person, place, and time.  Psychiatric:        Behavior: Behavior normal.    Previous notes and tests were reviewed. The plan was reviewed with the patient/family, and all questions/concerned were addressed.  It was my pleasure to see Ramces today and participate in his care. Please feel free to contact me with any questions or concerns.  Sincerely,  Wyline Mood, DO Allergy & Immunology  [facility] office: [phone] [facility] office: [phone]

## 2023-09-14 NOTE — Progress Notes (Deleted)
Follow Up Note  RE: George Montgomery MRN: [id number] DOB: Feb 28, 1956 Date of Office Visit: 09/15/2023  Referring provider: Lucky Cowboy, MD Primary care provider: Lucky Cowboy, MD  Chief Complaint: No chief complaint on file.  History of Present Illness: I had the pleasure of seeing George Montgomery for a follow up visit at the [facility] on 09/14/2023. He is a 67 y.o. 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 His previous allergy office visit was on 05/12/2023 with Dr. Selena Batten. Today is a regular follow up visit.  Discussed the use of AI scribe software for clinical note transcription with the patient, who gave verbal consent to proceed.  History of Present Illness            ymenoptera allergy Past history - Went to ER few times after stings due to hives and swelling. Never required Epi. 2023 bloodwork positive to honeybee, white faced hornet, yellow jacket, wasp, yellow hornet and bumblebee. Interim history - started VIT on 12/30/2022 (MV+HB+W) with no issues.  Continue to avoid. Continue venom injections - given today. For mild symptoms you can take over the counter antihistamines such as Benadryl and monitor symptoms closely. If symptoms worsen or if you have severe symptoms including breathing issues, throat closure, significant swelling, whole body hives, severe diarrhea and vomiting, lightheadedness then inject epinephrine and seek immediate medical care afterwards. Action plan in place.    Moderate persistent asthma without complication Past history - Gets wheezing, coughing and shortness of breath mainly during URIs. Uses albuterol rarely but during the last URI needed to add on Trelegy for 2 weeks. 2023 CXR unremarkable. 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 Clinically feeling  slightly improved.  Interim history - uses Breo for a few days at a time in a month. Confused about instructions. Today's spirometry was unremarkable.  Daily controller medication(s): start Breo 1 puff once a day and rinse mouth after each use.  Rinse mouth with Listerine alcohol free mouthwash. If not covered let us know. May use albuterol rescue inhaler 2 puffs or nebulizer every 4 to 6 hours as needed for shortness of breath, chest tightness, coughing, and wheezing. May use albuterol rescue inhaler 2 puffs 5 to 15 minutes prior to strenuous physical activities. Monitor frequency of use - if you need to use it more than twice per week on a consistent basis let us know.    Seasonal and perennial allergic rhinitis Past history - Perennial rhinoconjunctivitis symptoms for many years mainly in the spring and fall.  Tried Singulair, Zyrtec and Flonase with some benefit.  No prior ENT evaluation.  2023 CT sinus showed mild mucosal thickening. 2023 skin testing showed: Positive to dust mites, mold and cockroach. 2023 bloodwork positive to dust mites, grass, ragweed.  Borderline to cat, cockroach, tree, weed pollen. Interim history - some symptoms when outdoors. Reviewed nasal sprays. Continue environmental control measures as below. Continue Singulair (montelukast) 10mg  daily at night. Use over the counter antihistamines such as Zyrtec (cetirizine), Claritin (loratadine), Allegra (fexofenadine), or Xyzal (levocetirizine) daily as needed. May take twice a day during allergy flares. May switch antihistamines every few months. Use Flonase (fluticasone) nasal spray 1 spray per nostril twice a day as needed for nasal congestion.  Use azelastine or ipratropium nasal spray 1-2 sprays per nostril twice a day as needed for runny  nose/drainage. Nasal saline spray (i.e., Simply Saline) or nasal saline lavage (i.e., NeilMed) is recommended as needed and prior to medicated nasal sprays. Start allergy  injections - once done with venom build up. Had a detailed discussion with patient/family that clinical history is suggestive of allergic rhinitis, and may benefit from allergy immunotherapy (AIT). Discussed in detail regarding the dosing, schedule, side effects (mild to moderate local allergic reaction and rarely systemic allergic reactions including anaphylaxis), and benefits (significant improvement in nasal symptoms, seasonal flares of asthma) of immunotherapy with the patient. There is significant time commitment involved with allergy shots, which includes weekly immunotherapy injections for first 9-12 months and then biweekly to monthly injections for 3-5 years. Consent was signed.   Anaphylactic reaction due to food, subsequent encounter Past history - Upper body itching after eating mixed nuts in December 2023. Has eaten peanut butter since then with no issues. 2023 skin testing showed: Positive to [place] nuts. 2023 bloodwork positive to [place] nuts, borderline to walnuts, pecan and pistachio.  More likely to have anaphylactic reaction to [place] nuts, walnuts. Interim history - had alpha gal testing due to vomiting/diarrhea after starting Mounjaro. Previously tolerated with no issues. Continue strict avoidance of tree nuts. Okay with eating peanuts.  Okay to reintroduce red meat into diet. Bloodwork was borderline positive only and I doubt it was due to alpha gal allergy. If having issues then stop and let us know. For mild symptoms you can take over the counter antihistamines such as Benadryl and monitor symptoms closely. If symptoms worsen or if you have severe symptoms including breathing issues, throat closure, significant swelling, whole body hives, severe diarrhea and vomiting, lightheadedness then inject epinephrine and seek immediate medical care afterwards.   Penicillin allergy Consider penicillin allergy skin testing and in office drug challenge in the future.  Over 90% of people with  history of penicillin allergy which occurred over 10 years ago are found to be non-allergic.   Assessment and Plan: George Montgomery is a 67 y.o. male with: ymenoptera allergy Past history - Went to ER few times after stings due to hives and swelling. Never required Epi. 2023 bloodwork positive to honeybee, white faced hornet, yellow jacket, wasp, yellow hornet and bumblebee. Interim history - started VIT on 12/30/2022 (MV+HB+W) with no issues.  Continue to avoid. Continue venom injections - given today. For mild symptoms you can take over the counter antihistamines such as Benadryl and monitor symptoms closely. If symptoms worsen or if you have severe symptoms including breathing issues, throat closure, significant swelling, whole body hives, severe diarrhea and vomiting, lightheadedness then inject epinephrine and seek immediate medical care afterwards. Action plan in place.    Moderate persistent asthma without complication Past history - Gets wheezing, coughing and shortness of breath mainly during URIs. Uses albuterol rarely but during the last URI needed to add on Trelegy for 2 weeks. 2023 CXR unremarkable. 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 Clinically feeling slightly improved.  Interim history - uses Breo for a few days at a time in a month. Confused about instructions. Today's spirometry was unremarkable.  Daily controller medication(s): start Breo 1 puff once a day and rinse mouth after each use.  Rinse mouth with Listerine alcohol free mouthwash. If not covered let us know. May use albuterol rescue inhaler 2 puffs or nebulizer every 4 to 6 hours as needed for shortness of breath, chest tightness, coughing, and wheezing.  May use albuterol rescue inhaler 2 puffs 5 to 15 minutes prior to strenuous physical activities. Monitor frequency of use - if you need to use it more than twice per  week on a consistent basis let us know.    Seasonal and perennial allergic rhinitis Past history - Perennial rhinoconjunctivitis symptoms for many years mainly in the spring and fall.  Tried Singulair, Zyrtec and Flonase with some benefit.  No prior ENT evaluation.  2023 CT sinus showed mild mucosal thickening. 2023 skin testing showed: Positive to dust mites, mold and cockroach. 2023 bloodwork positive to dust mites, grass, ragweed.  Borderline to cat, cockroach, tree, weed pollen. Interim history - some symptoms when outdoors. Reviewed nasal sprays. Continue environmental control measures as below. Continue Singulair (montelukast) 10mg  daily at night. Use over the counter antihistamines such as Zyrtec (cetirizine), Claritin (loratadine), Allegra (fexofenadine), or Xyzal (levocetirizine) daily as needed. May take twice a day during allergy flares. May switch antihistamines every few months. Use Flonase (fluticasone) nasal spray 1 spray per nostril twice a day as needed for nasal congestion.  Use azelastine or ipratropium nasal spray 1-2 sprays per nostril twice a day as needed for runny nose/drainage. Nasal saline spray (i.e., Simply Saline) or nasal saline lavage (i.e., NeilMed) is recommended as needed and prior to medicated nasal sprays. Start allergy injections - once done with venom build up. Had a detailed discussion with patient/family that clinical history is suggestive of allergic rhinitis, and may benefit from allergy immunotherapy (AIT). Discussed in detail regarding the dosing, schedule, side effects (mild to moderate local allergic reaction and rarely systemic allergic reactions including anaphylaxis), and benefits (significant improvement in nasal symptoms, seasonal flares of asthma) of immunotherapy with the patient. There is significant time commitment involved with allergy shots, which includes weekly immunotherapy injections for first 9-12 months and then biweekly to monthly injections  for 3-5 years. Consent was signed.   Anaphylactic reaction due to food, subsequent encounter Past history - Upper body itching after eating mixed nuts in December 2023. Has eaten peanut butter since then with no issues. 2023 skin testing showed: Positive to [place] nuts. 2023 bloodwork positive to [place] nuts, borderline to walnuts, pecan and pistachio.  More likely to have anaphylactic reaction to [place] nuts, walnuts. Interim history - had alpha gal testing due to vomiting/diarrhea after starting Mounjaro. Previously tolerated with no issues. Continue strict avoidance of tree nuts. Okay with eating peanuts.  Okay to reintroduce red meat into diet. Bloodwork was borderline positive only and I doubt it was due to alpha gal allergy. If having issues then stop and let us know. For mild symptoms you can take over the counter antihistamines such as Benadryl and monitor symptoms closely. If symptoms worsen or if you have severe symptoms including breathing issues, throat closure, significant swelling, whole body hives, severe diarrhea and vomiting, lightheadedness then inject epinephrine and seek immediate medical care afterwards.   Penicillin allergy Consider penicillin allergy skin testing and in office drug challenge in the future.  Over 90% of people with history of penicillin allergy which occurred over 10 years ago are found to be non-allergic.  Assessment and Plan              No follow-ups on file.  No orders of the defined types were placed in this encounter.  Lab Orders  No laboratory test(s) ordered today    Diagnostics: Spirometry:  Tracings reviewed. His effort: {Blank single:19197::"Good reproducible efforts.","It was hard to get consistent efforts and there  is a question as to whether this reflects a maximal maneuver.","Poor effort, data can not be interpreted."} FVC: ***L FEV1: ***L, ***% predicted FEV1/FVC ratio: ***% Interpretation: {Blank single:19197::"Spirometry  consistent with mild obstructive disease","Spirometry consistent with moderate obstructive disease","Spirometry consistent with severe obstructive disease","Spirometry consistent with possible restrictive disease","Spirometry consistent with mixed obstructive and restrictive disease","Spirometry uninterpretable due to technique","Spirometry consistent with normal pattern","No overt abnormalities noted given today's efforts"}.  Please see scanned spirometry results for details.  Skin Testing: {Blank single:19197::"Select foods","Environmental allergy panel","Environmental allergy panel and select foods","Food allergy panel","None","Deferred due to recent antihistamines use"}. *** Results discussed with patient/family.   Medication List:  Current Outpatient Medications  Medication Sig Dispense Refill  . albuterol (VENTOLIN HFA) 108 (90 Base) MCG/ACT inhaler USE 2 INHALATIONS 15-20 MINUTES APART EVERY 4 HOURS AS NEEDED TO RESCUE ASTHMA 48 g 3  . bisoprolol (ZEBETA) 10 MG tablet Take 1 tablet by mouth daily. (Patient not taking: Reported on 08/20/2023)    . cetirizine (ZYRTEC ALLERGY) 10 MG tablet Take 1 tablet (10 mg total) by mouth daily. 30 tablet 0  . cholestyramine (QUESTRAN) 4 g packet Take 1 packet by mouth 2 (two) times daily for 15 days. 30 each 0  . EPINEPHrine (EPIPEN 2-PAK) 0.3 mg/0.3 mL IJ SOAJ injection Inject 0.3 mg into the muscle as needed for anaphylaxis. 2 each 2  . escitalopram (LEXAPRO) 10 MG tablet Take  1 tablet  Daily for Mood                                                              /                                                                   TAKE                                         BY                                                 MOUTH 90 tablet 3  . fenofibrate micronized (LOFIBRA) 134 MG capsule Take 1 capsule Daily for Triglycerides ( Blood Fats)                                                 /  TAKE                                         BY                                                 MOUTH                     ONCE ? ? ?  DAILY 90 capsule 3  . fexofenadine (ALLEGRA) 180 MG tablet Take by mouth.    . fluconazole (DIFLUCAN) 150 MG tablet Take 1 tab day 1 and second tab on day 4 (Patient not taking: Reported on 08/20/2023) 12 tablet 0  . fluticasone (FLONASE) 50 MCG/ACT nasal spray Place 1 spray into both nostrils 2 (two) times daily as needed (nasal congestion). 16 g 5  . fluticasone furoate-vilanterol (BREO ELLIPTA) 200-25 MCG/ACT AEPB Inhale 1 puff into the lungs daily. Rinse mouth after each use. 60 each 3  . ipratropium (ATROVENT) 0.06 % nasal spray Use 1 to 2 sprays each nostril 2 to 3 x /day as needed 45 mL 3  . meloxicam (MOBIC) 15 MG tablet Take 1/2 to 1 tablet Daily with Food for Pain & Inflammation                                                                   /                                   TAKE                                 BY                                    MOUTH                               ONCE  ?  ?  ?  DAILY 90 tablet 3  . montelukast (SINGULAIR) 10 MG tablet Take  1 tablet  Daily for Allergies                               /                                                                   TAKE  BY                                                 MOUTH ONCE ? ? ?  DAILY 90 tablet 3  . nystatin (MYCOSTATIN) 100000 UNIT/ML suspension Take 5 mLs (500,000 Units total) by mouth 4 (four) times daily. Swish and swallow. 60 mL 0  . omeprazole (PRILOSEC) 20 MG capsule Take  1 capsule  Daily to Prevent Heartburn & Indigestion                                   /                                                                   TAKE                                         BY                                                 MOUTH 90 capsule 3  . ondansetron (ZOFRAN) 8 MG tablet Take  1/2 to 1 tablet   3 x / day  for Nausea 90 tablet 2  .  phentermine (ADIPEX-P) 37.5 MG tablet TAKE 1 TABLET EVERY MORNING FOR DIETING & WEIGHT LOSS 90 tablet 1  . promethazine-dextromethorphan (PROMETHAZINE-DM) 6.25-15 MG/5ML syrup Take 5 mLs by mouth 4 (four) times daily as needed for cough. (Patient not taking: Reported on 08/20/2023) 240 mL 1  . Semaglutide, 2 MG/DOSE, (OZEMPIC, 2 MG/DOSE,) 8 MG/3ML SOPN Inject 2 mg (0.75 ml)   into Skin  every 7 to 12 days for Diabetes  (Dx:  e11.29) 9 mL 9  . sildenafil (VIAGRA) 100 MG tablet Take   1/2 to 1 tablet   Daily  as needed for XXXX 30 tablet 1  . tamsulosin (FLOMAX) 0.4 MG CAPS capsule TAKE 1 CAPSULE BY MOUTH AT BEDTIME FOR PROSTATE 90 capsule 0  . valsartan-hydrochlorothiazide (DIOVAN-HCT) 80-12.5 MG tablet Take  1 tablet  Daily  for BP 90 tablet 3  . Zinc 50 MG TABS Take by mouth.     No current facility-administered medications for this visit.   Allergies: Allergies  Allergen Reactions  . Bee Venom Anaphylaxis  . Pine Anaphylaxis  . [place] Nut (Berthollefia Excelsa)     pruritus  . Lisinopril Other (See Comments)    Patient intolerant of drug which caused his tongue to tingle.  Marland Kitchen Penicillins Itching  . Celebrex [Celecoxib]    I reviewed his past medical history, social history, family history, and environmental history and no significant changes have been reported from his previous visit.  Review of Systems  Constitutional:  Negative for appetite change, chills, fever and unexpected weight change.  HENT:  Positive for congestion, postnasal drip and rhinorrhea.  Eyes:  Negative for itching.  Respiratory:  Negative for chest tightness, shortness of breath and wheezing.   Cardiovascular:  Negative for chest pain.  Gastrointestinal:  Negative for abdominal pain.  Genitourinary:  Negative for difficulty urinating.  Skin:  Negative for rash.  Allergic/Immunologic: Positive for environmental allergies and food allergies.   Objective: There were no vitals taken for this visit. There is no  height or weight on file to calculate BMI. Physical Exam Vitals and nursing note reviewed.  Constitutional:      Appearance: Normal appearance. He is well-developed. He is obese.  HENT:     Head: Normocephalic and atraumatic.     Right Ear: Tympanic membrane and external ear normal.     Left Ear: Tympanic membrane and external ear normal.     Nose: Nose normal.     Mouth/Throat:     Mouth: Mucous membranes are moist.     Pharynx: Oropharynx is clear.  Eyes:     Conjunctiva/sclera: Conjunctivae normal.  Cardiovascular:     Rate and Rhythm: Normal rate and regular rhythm.     Heart sounds: Normal heart sounds. No murmur heard. Pulmonary:     Effort: Pulmonary effort is normal.     Breath sounds: Normal breath sounds. No wheezing, rhonchi or rales.  Musculoskeletal:     Cervical back: Neck supple.  Skin:    General: Skin is warm.     Findings: No rash.  Neurological:     Mental Status: He is alert and oriented to person, place, and time.  Psychiatric:        Behavior: Behavior normal.  Previous notes and tests were reviewed. The plan was reviewed with the patient/family, and all questions/concerned were addressed.  It was my pleasure to see George Montgomery today and participate in his care. Please feel free to contact me with any questions or concerns.  Sincerely,  Wyline Mood, DO Allergy & Immunology  [facility] office: [phone] [facility] office: [phone]

## 2023-09-15 ENCOUNTER — Ambulatory Visit: Payer: Medicare HMO

## 2023-09-15 ENCOUNTER — Ambulatory Visit: Payer: Medicare HMO | Admitting: Allergy

## 2023-09-15 DIAGNOSIS — T7800XD Anaphylactic reaction due to unspecified food, subsequent encounter: Secondary | ICD-10-CM

## 2023-09-15 DIAGNOSIS — Z91038 Other insect allergy status: Secondary | ICD-10-CM

## 2023-09-15 DIAGNOSIS — Z880 Allergy status to penicillin: Secondary | ICD-10-CM

## 2023-09-15 DIAGNOSIS — M545 Low back pain, unspecified: Secondary | ICD-10-CM | POA: Diagnosis not present

## 2023-09-15 DIAGNOSIS — J302 Other seasonal allergic rhinitis: Secondary | ICD-10-CM

## 2023-09-16 ENCOUNTER — Other Ambulatory Visit: Payer: Self-pay | Admitting: Nurse Practitioner

## 2023-09-22 ENCOUNTER — Other Ambulatory Visit: Payer: Self-pay

## 2023-09-22 ENCOUNTER — Ambulatory Visit: Payer: Medicare HMO | Admitting: Allergy

## 2023-09-22 ENCOUNTER — Ambulatory Visit: Payer: Medicare HMO

## 2023-09-22 ENCOUNTER — Encounter: Payer: Self-pay | Admitting: Allergy

## 2023-09-22 VITALS — BP 118/58 | HR 70 | Temp 99.1°F | Resp 16

## 2023-09-22 DIAGNOSIS — J302 Other seasonal allergic rhinitis: Secondary | ICD-10-CM | POA: Diagnosis not present

## 2023-09-22 DIAGNOSIS — Z880 Allergy status to penicillin: Secondary | ICD-10-CM | POA: Diagnosis not present

## 2023-09-22 DIAGNOSIS — Z91038 Other insect allergy status: Secondary | ICD-10-CM | POA: Diagnosis not present

## 2023-09-22 DIAGNOSIS — J4540 Moderate persistent asthma, uncomplicated: Secondary | ICD-10-CM

## 2023-09-22 DIAGNOSIS — T7800XD Anaphylactic reaction due to unspecified food, subsequent encounter: Secondary | ICD-10-CM | POA: Diagnosis not present

## 2023-09-22 DIAGNOSIS — J3089 Other allergic rhinitis: Secondary | ICD-10-CM

## 2023-09-22 MED ORDER — FLUTICASONE FUROATE-VILANTEROL 200-25 MCG/ACT IN AEPB: 1.0000 | INHALATION_SPRAY | Freq: Every day | RESPIRATORY_TRACT | 5 refills | Status: DC

## 2023-09-22 MED ORDER — ALBUTEROL SULFATE HFA 108 (90 BASE) MCG/ACT IN AERS: 2.0000 | INHALATION_SPRAY | RESPIRATORY_TRACT | 1 refills | Status: DC | PRN

## 2023-09-22 NOTE — Patient Instructions (Addendum)
Breathing Daily controller medication(s): continue Breo 1 puff once a day and rinse mouth after each use.  May use albuterol rescue inhaler 2 puffs or nebulizer every 4 to 6 hours as needed for shortness of breath, chest tightness, coughing, and wheezing. May use albuterol rescue inhaler 2 puffs 5 to 15 minutes prior to strenuous physical activities. Monitor frequency of use - if you need to use it more than twice per week on a consistent basis let us know.  Breathing control goals:  Full participation in all desired activities (may need albuterol before activity) Albuterol use two times or less a week on average (not counting use with activity) Cough interfering with sleep two times or less a month Oral steroids no more than once a year No hospitalizations  Food 2023 testing positive to [place] nuts, borderline to walnuts, pecan and pistachio.  More likely to have anaphylactic reaction to [place] nuts, walnuts. Continue strict avoidance of tree nuts and peanuts. For mild symptoms you can take over the counter antihistamines such as Benadryl and monitor symptoms closely. If symptoms worsen or if you have severe symptoms including breathing issues, throat closure, significant swelling, whole body hives, severe diarrhea and vomiting, lightheadedness then inject epinephrine and seek immediate medical care afterwards. Action plan in place.   Bee stings 2023 bloodwork positive to honeybee, white faced hornet, yellow jacket, wasp, yellow hornet and bumblebee. Continue to avoid. Continue venom injections - given today. For mild symptoms you can take over the counter antihistamines such as Benadryl and monitor symptoms closely. If symptoms worsen or if you have severe symptoms including breathing issues, throat closure, significant swelling, whole body hives, severe diarrhea and vomiting, lightheadedness then inject epinephrine and seek immediate medical care afterwards. Action plan in place.    Environmental allergies 2023 skin testing positive to dust mites, mold and cockroach. 2023 bloodwork positive to dust mites, grass, ragweed.  Borderline to cat, cockroach, tree, weed pollen. Continue environmental control measures. Continue Singulair (montelukast) 10mg  daily at night. Use over the counter antihistamines such as Zyrtec (cetirizine), Claritin (loratadine), Allegra (fexofenadine), or Xyzal (levocetirizine) daily as needed. May take twice a day during allergy flares. May switch antihistamines every few months. Use Flonase (fluticasone) nasal spray 1 spray per nostril twice a day as needed for nasal congestion.  Use azelastine or ipratropium nasal spray 1-2 sprays per nostril twice a day as needed for runny nose/drainage. Nasal saline spray (i.e., Simply Saline) or nasal saline lavage (i.e., NeilMed) is recommended as needed and prior to medicated nasal sprays. Start allergy injections - once done with venom build up. Call use and make first environmental allergy shot appointment.   Infections Keep track of infections and antibiotics use.  Penicillin allergy: Consider penicillin allergy skin testing and in office drug challenge in the future.  Over 90% of people with history of penicillin allergy which occurred over 10 years ago are found to be non-allergic.  You must be off antihistamines for 3-5 days before. Plan on being in the office for 2-3 hours. You must call to schedule an appointment and specify it's for a drug challenge.  A few days prior to the appointment, I will send in a prescription for amoxicillin liquid which you must bring to the appointment as well.   Follow up in 4 months or sooner if needed.

## 2023-09-22 NOTE — Progress Notes (Signed)
Follow Up Note  RE: George Montgomery MRN: [id number] DOB: September 26, 1956 Date of Office Visit: 09/22/2023  Referring provider: Lucky Cowboy, MD Primary care provider: Lucky Cowboy, MD  Chief Complaint: Follow-up, Allergies, Asthma, and Other (Had a reaction to ozempic. And then gabapentin. )  History of Present Illness: I had the pleasure of seeing George Montgomery for a follow up visit at the [facility] on 09/22/2023. He is a 67 y.o. 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 His previous allergy office visit was on 05/12/2023 with Dr. Selena Batten. Today is a regular follow up visit.  Discussed the use of AI scribe software for clinical note transcription with the patient, who gave verbal consent to proceed.  The patient reports a recent episode of illness and back injury that led to a temporary cessation of his inhaler for three days. Prior to this, he had been taking the Breo inhaler daily and reports that his asthma symptoms have been well-controlled, with no instances of feeling winded. He has since resumed his inhaler use.  The patient also reports ongoing issues with his back, which he describes as "awful." Despite this, he notes some improvement with the use of ice, walking, and exercise. He also mentions a recent diagnosis of sciatica.  In terms of allergies, the patient is currently receiving weekly shots for stinging insects, with localized pruritus. He has not been stung since the last visit. He also reports avoiding tree nuts and peanuts. He expresses fear of nuts and has chosen to avoid all types.   He reintroduce red meat back into his diet with no issues.   The patient also mentions environmental allergies, particularly to dogs and cats. He has dog at home. Despite this, he reports managing well with frequent vacuuming and the use of an automatic vacuum. He also takes an antihistamine from  [employer] and uses nasal sprays as needed.  The patient is due for foot surgery due to a bone spur on his right toe. He also mentions a recent weight loss of about twenty pounds and expresses a goal to lose another twenty pounds in the coming year. He hopes this will allow him to discontinue some of his current medications.     Assessment and Plan: George Montgomery is a 67 y.o. male with: Hymenoptera allergy Past history - Went to ER few times after stings due to hives and swelling. Never required Epi. 2023 bloodwork positive to honeybee, white faced hornet, yellow jacket, wasp, yellow hornet and bumblebee. Started VIT on 12/30/2022 (MV+HB+W). Interim history -  localized reactions to VIT. Continue to avoid. Continue venom injections - given today. For mild symptoms you can take over the counter antihistamines such as Benadryl and monitor symptoms closely. If symptoms worsen or if you have severe symptoms including breathing issues, throat closure, significant swelling, whole body hives, severe diarrhea and vomiting, lightheadedness then inject epinephrine and seek immediate medical care afterwards. Action plan in place.   Moderate persistent asthma without complication Past history - Gets wheezing, coughing and shortness of breath mainly during URIs. Uses albuterol rarely but during the last URI needed to add on Trelegy for 2 weeks. 2023 CXR unremarkable. 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 Clinically feeling slightly improved.  Interim history - Reports improved symptoms with daily use of Breo inhaler.  Daily controller medication(s): continue Breo  1 puff once a day and rinse mouth after each use.  May use albuterol rescue inhaler 2 puffs or nebulizer every 4 to 6 hours as needed for shortness of breath, chest tightness, coughing, and wheezing. May use albuterol rescue inhaler 2 puffs 5 to 15 minutes  prior to strenuous physical activities. Monitor frequency of use - if you need to use it more than twice per week on a consistent basis let us know.  Get spirometry at next visit - not today due to back pain issues.   Seasonal and perennial allergic rhinitis Past history - Perennial rhinoconjunctivitis symptoms for many years mainly in the spring and fall.  Tried Singulair, Zyrtec and Flonase with some benefit.  No prior ENT evaluation.  2023 CT sinus showed mild mucosal thickening. 2023 skin testing positive to dust mites, mold and cockroach. 2023 bloodwork positive to dust mites, grass, ragweed.  Borderline to cat, cockroach, tree, weed pollen. Interim history - still symptomatic. Interested in starting AIT next year.  Continue environmental control measures. Continue Singulair (montelukast) 10mg  daily at night. Use over the counter antihistamines such as Zyrtec (cetirizine), Claritin (loratadine), Allegra (fexofenadine), or Xyzal (levocetirizine) daily as needed. May take twice a day during allergy flares. May switch antihistamines every few months. Use Flonase (fluticasone) nasal spray 1 spray per nostril twice a day as needed for nasal congestion.  Use azelastine or ipratropium nasal spray 1-2 sprays per nostril twice a day as needed for runny nose/drainage. Nasal saline spray (i.e., Simply Saline) or nasal saline lavage (i.e., NeilMed) is recommended as needed and prior to medicated nasal sprays. Start allergy injections - once done with venom build up. Call use and make first environmental allergy shot appointment.   Anaphylactic reaction due to food, subsequent encounter Past history - Upper body itching after eating mixed nuts in December 2023. Has eaten peanut butter since then with no issues. 2023 skin testing showed: Positive to [place] nuts. 2023 bloodwork positive to [place] nuts, borderline to walnuts, pecan and pistachio.  More likely to have anaphylactic reaction to [place] nuts,  walnuts. Interim history - tolerated red meat now. Avoiding peanuts as well.  Continue strict avoidance of tree nuts and peanuts. For mild symptoms you can take over the counter antihistamines such as Benadryl and monitor symptoms closely. If symptoms worsen or if you have severe symptoms including breathing issues, throat closure, significant swelling, whole body hives, severe diarrhea and vomiting, lightheadedness then inject epinephrine and seek immediate medical care afterwards. Action plan in place.   Penicillin allergy Consider penicillin allergy skin testing and in office drug challenge in the future.  Over 90% of people with history of penicillin allergy which occurred over 10 years ago are found to be non-allergic.   Return in about 4 months (around 01/21/2024).  Meds ordered this encounter  Medications   fluticasone furoate-vilanterol (BREO ELLIPTA) 200-25 MCG/ACT AEPB    Sig: Inhale 1 puff into the lungs daily. Rinse mouth after each use.    Dispense:  60 each    Refill:  5   albuterol (VENTOLIN HFA) 108 (90 Base) MCG/ACT inhaler    Sig: Inhale 2 puffs into the lungs every 4 (four) hours as needed for wheezing or shortness of breath (coughing fits).    Dispense:  18 g    Refill:  1   Lab Orders  No laboratory test(s) ordered today    Diagnostics: None.  Medication List:  Current Outpatient Medications  Medication Sig Dispense Refill   albuterol (  VENTOLIN HFA) 108 (90 Base) MCG/ACT inhaler Inhale 2 puffs into the lungs every 4 (four) hours as needed for wheezing or shortness of breath (coughing fits). 18 g 1   bisoprolol (ZEBETA) 10 MG tablet TAKE 1 TABLET BY MOUTH EVERY DAY 90 tablet 2   EPINEPHrine (EPIPEN 2-PAK) 0.3 mg/0.3 mL IJ SOAJ injection Inject 0.3 mg into the muscle as needed for anaphylaxis. 2 each 2   escitalopram (LEXAPRO) 10 MG tablet Take  1 tablet  Daily for Mood                                                              /                                                                    TAKE                                         BY                                                 MOUTH 90 tablet 3   fenofibrate micronized (LOFIBRA) 134 MG capsule Take 1 capsule Daily for Triglycerides ( Blood Fats)                                                 /                                                                   TAKE                                         BY                                                 MOUTH                     ONCE ? ? ?  DAILY 90 capsule 3   fexofenadine (ALLEGRA) 180 MG tablet Take by mouth.     fluconazole (DIFLUCAN) 150 MG tablet Take 1 tab day 1 and second tab on day 4 12 tablet 0   fluticasone (FLONASE) 50 MCG/ACT  nasal spray Place 1 spray into both nostrils 2 (two) times daily as needed (nasal congestion). 16 g 5   ipratropium (ATROVENT) 0.06 % nasal spray Use 1 to 2 sprays each nostril 2 to 3 x /day as needed 45 mL 3   montelukast (SINGULAIR) 10 MG tablet Take  1 tablet  Daily for Allergies                               /                                                                   TAKE                                         BY                                                 MOUTH ONCE ? ? ?  DAILY 90 tablet 3   nystatin (MYCOSTATIN) 100000 UNIT/ML suspension Take 5 mLs (500,000 Units total) by mouth 4 (four) times daily. Swish and swallow. 60 mL 0   omeprazole (PRILOSEC) 20 MG capsule Take  1 capsule  Daily to Prevent Heartburn & Indigestion                                   /                                                                   TAKE                                         BY                                                 MOUTH 90 capsule 3   ondansetron (ZOFRAN) 8 MG tablet Take  1/2 to 1 tablet   3 x / day  for Nausea 90 tablet 2   phentermine (ADIPEX-P) 37.5 MG tablet TAKE 1 TABLET EVERY MORNING FOR DIETING & WEIGHT LOSS 90 tablet 1   promethazine-dextromethorphan (PROMETHAZINE-DM) 6.25-15 MG/5ML  syrup Take 5 mLs by mouth 4 (four) times daily as needed for cough. 240 mL 1   Semaglutide, 2 MG/DOSE, (OZEMPIC, 2 MG/DOSE,) 8 MG/3ML SOPN Inject 2 mg (0.75 ml)   into Skin  every 7 to 12 days for Diabetes  (Dx:  e11.29) 9 mL 9  sildenafil (VIAGRA) 100 MG tablet Take   1/2 to 1 tablet   Daily  as needed for XXXX 30 tablet 1   tamsulosin (FLOMAX) 0.4 MG CAPS capsule TAKE 1 CAPSULE BY MOUTH AT BEDTIME FOR PROSTATE 90 capsule 0   valsartan-hydrochlorothiazide (DIOVAN-HCT) 80-12.5 MG tablet Take  1 tablet  Daily  for BP 90 tablet 3   Zinc 50 MG TABS Take by mouth.     cetirizine (ZYRTEC ALLERGY) 10 MG tablet Take 1 tablet (10 mg total) by mouth daily. (Patient not taking: Reported on 09/22/2023) 30 tablet 0   fluticasone furoate-vilanterol (BREO ELLIPTA) 200-25 MCG/ACT AEPB Inhale 1 puff into the lungs daily. Rinse mouth after each use. 60 each 5   meloxicam (MOBIC) 15 MG tablet Take 1/2 to 1 tablet Daily with Food for Pain & Inflammation                                                                   /                                   TAKE                                 BY                                    MOUTH                               ONCE  ?  ?  ?  DAILY (Patient not taking: Reported on 09/22/2023) 90 tablet 3   No current facility-administered medications for this visit.   Allergies: Allergies  Allergen Reactions   Bee Venom Anaphylaxis   Pine Anaphylaxis   [place] Nut (Berthollefia Excelsa)     pruritus   Lisinopril Other (See Comments)    Patient intolerant of drug which caused his tongue to tingle.   Penicillins Itching   Celebrex [Celecoxib]    I reviewed his past medical history, social history, family history, and environmental history and no significant changes have been reported from his previous visit.  Review of Systems  Constitutional:  Negative for appetite change, chills, fever and unexpected weight change.  HENT:  Positive for congestion, postnasal drip and  rhinorrhea.   Eyes:  Negative for itching.  Respiratory:  Negative for chest tightness, shortness of breath and wheezing.   Cardiovascular:  Negative for chest pain.  Gastrointestinal:  Negative for abdominal pain.  Genitourinary:  Negative for difficulty urinating.  Skin:  Negative for rash.  Allergic/Immunologic: Positive for environmental allergies and food allergies.    Objective: BP (!) 118/58 (BP Location: Right Arm, Patient Position: Sitting, Cuff Size: Normal)   Pulse 70   Temp 99.1 F (37.3 C) (Temporal)   Resp 16   SpO2 96%  There is no height or weight on file to calculate BMI. Physical Exam Vitals and nursing note reviewed.  Constitutional:      Appearance: Normal  appearance. He is well-developed. He is obese.  HENT:     Head: Normocephalic and atraumatic.     Right Ear: Tympanic membrane and external ear normal.     Left Ear: Tympanic membrane and external ear normal.     Nose: Nose normal.     Mouth/Throat:     Mouth: Mucous membranes are moist.     Pharynx: Oropharynx is clear.  Eyes:     Conjunctiva/sclera: Conjunctivae normal.  Cardiovascular:     Rate and Rhythm: Normal rate and regular rhythm.     Heart sounds: Normal heart sounds. No murmur heard. Pulmonary:     Effort: Pulmonary effort is normal.     Breath sounds: Normal breath sounds. No wheezing, rhonchi or rales.  Musculoskeletal:     Cervical back: Neck supple.  Skin:    General: Skin is warm.     Findings: No rash.  Neurological:     Mental Status: He is alert and oriented to person, place, and time.  Psychiatric:        Behavior: Behavior normal.   Previous notes and tests were reviewed. The plan was reviewed with the patient/family, and all questions/concerned were addressed.  It was my pleasure to see George Montgomery today and participate in his care. Please feel free to contact me with any questions or concerns.  Sincerely,  Wyline Mood, DO Allergy & Immunology  [facility] office: [phone] [facility] office: [phone]

## 2023-09-28 ENCOUNTER — Ambulatory Visit (INDEPENDENT_AMBULATORY_CARE_PROVIDER_SITE_OTHER): Payer: Self-pay | Admitting: Plastic Surgery

## 2023-09-28 ENCOUNTER — Encounter: Payer: Self-pay | Admitting: Plastic Surgery

## 2023-09-28 VITALS — BP 121/69

## 2023-09-28 DIAGNOSIS — L719 Rosacea, unspecified: Secondary | ICD-10-CM

## 2023-09-28 NOTE — Progress Notes (Signed)
Preoperative Dx: rosacea of face  Postoperative Dx:  same  Procedure: laser to face   Anesthesia: none  Description of Procedure:  Risks and complications were explained to the patient. Consent was confirmed and signed. Eye protection was placed. Time out was called and all information was confirmed to be correct. The area  area was prepped with alcohol and wiped dry. The Heroic laser was set at 532 nm and preset J/cm2. The face was lasered. The patient tolerated the procedure well and there were no complications. The patient is to follow up in 4 weeks.

## 2023-09-29 ENCOUNTER — Ambulatory Visit (INDEPENDENT_AMBULATORY_CARE_PROVIDER_SITE_OTHER): Payer: Medicare HMO

## 2023-09-29 DIAGNOSIS — Z91038 Other insect allergy status: Secondary | ICD-10-CM | POA: Diagnosis not present

## 2023-10-02 DIAGNOSIS — G4733 Obstructive sleep apnea (adult) (pediatric): Secondary | ICD-10-CM | POA: Diagnosis not present

## 2023-10-04 DIAGNOSIS — M533 Sacrococcygeal disorders, not elsewhere classified: Secondary | ICD-10-CM | POA: Diagnosis not present

## 2023-10-04 DIAGNOSIS — M545 Low back pain, unspecified: Secondary | ICD-10-CM | POA: Diagnosis not present

## 2023-10-05 ENCOUNTER — Ambulatory Visit: Payer: Medicare HMO

## 2023-10-12 ENCOUNTER — Ambulatory Visit (INDEPENDENT_AMBULATORY_CARE_PROVIDER_SITE_OTHER): Payer: Medicare HMO | Admitting: *Deleted

## 2023-10-12 DIAGNOSIS — J309 Allergic rhinitis, unspecified: Secondary | ICD-10-CM

## 2023-10-12 DIAGNOSIS — Z91038 Other insect allergy status: Secondary | ICD-10-CM | POA: Diagnosis not present

## 2023-10-16 ENCOUNTER — Ambulatory Visit
Admission: EM | Admit: 2023-10-16 | Discharge: 2023-10-16 | Disposition: A | Discharge status: Home / Self Care | Payer: Medicare HMO | Attending: Physician Assistant | Admitting: Physician Assistant

## 2023-10-16 ENCOUNTER — Ambulatory Visit (INDEPENDENT_AMBULATORY_CARE_PROVIDER_SITE_OTHER): Payer: Medicare HMO

## 2023-10-16 DIAGNOSIS — R059 Cough, unspecified: Secondary | ICD-10-CM | POA: Diagnosis not present

## 2023-10-16 DIAGNOSIS — R0989 Other specified symptoms and signs involving the circulatory and respiratory systems: Secondary | ICD-10-CM | POA: Diagnosis not present

## 2023-10-16 DIAGNOSIS — R051 Acute cough: Secondary | ICD-10-CM

## 2023-10-16 DIAGNOSIS — J4521 Mild intermittent asthma with (acute) exacerbation: Secondary | ICD-10-CM | POA: Diagnosis not present

## 2023-10-16 DIAGNOSIS — J329 Chronic sinusitis, unspecified: Secondary | ICD-10-CM | POA: Diagnosis not present

## 2023-10-16 DIAGNOSIS — J40 Bronchitis, not specified as acute or chronic: Secondary | ICD-10-CM

## 2023-10-16 MED ORDER — DOXYCYCLINE HYCLATE 100 MG PO CAPS: 100.0000 mg | ORAL_CAPSULE | Freq: Two times a day (BID) | ORAL | 0 refills | Status: DC

## 2023-10-16 MED ORDER — IPRATROPIUM-ALBUTEROL 0.5-2.5 (3) MG/3ML IN SOLN
3.0000 mL | Freq: Once | RESPIRATORY_TRACT | Status: AC
  Administered 2023-10-16: 3 mL via RESPIRATORY_TRACT

## 2023-10-16 MED ORDER — PREDNISONE 20 MG PO TABS: 20.0000 mg | ORAL_TABLET | Freq: Every day | ORAL | 0 refills | Status: AC

## 2023-10-16 NOTE — Discharge Instructions (Addendum)
Your x-ray did not show any evidence of pneumonia which is great news.  We are going to treat for sinobronchitis given your worsening cough as well as an asthma exacerbation.  Start doxycycline 100 mg twice daily for 10 days.  Stay out of the sun while on this medication.  Take prednisone 20 mg for 4 days.  Do not take NSAIDs with this medication including aspirin, ibuprofen/Advil, naproxen/Aleve.  Continue your asthma medication including Breo Ellipta.  Follow-up with your primary care if your symptoms are not improving within a few days.  If anything worsens and you have worsening cough, high fever, chest pain, shortness of breath you need to be seen immediately.

## 2023-10-16 NOTE — ED Provider Notes (Signed)
[name]    CSN: [id number] Arrival date & time: 10/16/23  0802      History   Chief Complaint No chief complaint on file.   HPI George Montgomery is a 67 y.o. male.   Patient presents today with a 5 to 6-day history of URI symptoms.  Reports that he was exposed to a viral illness by his grandson when they celebrated Christmas last week and his symptoms have significantly worsened.  He reports over the past several days he has had worsening cough and shortness of breath.  He does report having history of asthma and has been using his albuterol since his symptoms began with temporary improvement of symptoms.  He has been trying over-the-counter medication without improvement.  Reports that he is prone to asthma and pneumonia and so was concerned because his symptoms have been worsening and his cough has become unbearable.  He denies any chest pain, fever, nausea, vomiting.  He has had COVID several years ago but not more recently.  He denies history of diabetes but does have prediabetes.  Denies any recent antibiotics or steroids in the past 90 days.  Denies hospitalization related to asthma in the past.  He is a former smoker he quit many years ago.    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 11/13/2020   Insulin resistance 11/13/2020   Fatty liver 02/14/2020   Benign prostatic hyperplasia (BPH) with post-void dribbling 02/14/2020   Gastroesophageal reflux disease 06/02/2018   Insomnia 02/22/2018   Chronic pain of both shoulders 11/22/2017   Depression 07/20/2017   Trigger finger, right ring finger 04/07/2017   Hyperlipidemia, mixed 08/06/2015   Essential hypertension 11/06/2014   Cluster headaches 11/06/2014   OSA treated with BiPAP 11/06/2014   Vitamin D deficiency 11/06/2014   Testosterone deficiency 11/06/2014   Morbid obesity (HCC) 11/06/2014    Past Surgical History:  Procedure Laterality Date   APPENDECTOMY  07/2012   bone spur removal     HEMORRHOID SURGERY  09/2012   HERNIA REPAIR         Home Medications    Prior to Admission medications   Medication Sig Start Date End Date Taking? Authorizing Provider  doxycycline (VIBRAMYCIN) 100 MG capsule Take 1 capsule (100 mg total) by mouth 2 (two) times daily. 10/16/23  Yes Kamelia Lampkins K, PA-C  predniSONE (DELTASONE) 20 MG tablet Take 1 tablet (20 mg total) by mouth daily for 4 days. 10/16/23 10/20/23 Yes Christorpher Hisaw K, PA-C  albuterol (VENTOLIN HFA) 108 (90 Base) MCG/ACT inhaler Inhale 2 puffs into the lungs every 4 (four) hours as  needed for wheezing or shortness of breath (coughing fits). 09/22/23   Ellamae Sia, DO  bisoprolol (ZEBETA) 10 MG tablet TAKE 1 TABLET BY MOUTH EVERY DAY 09/16/23   Adela Glimpse, NP  cetirizine (ZYRTEC ALLERGY) 10 MG tablet Take 1 tablet (10 mg total) by mouth daily. 05/18/21   Mamta Rimmer, Noberto Retort, PA-C  EPINEPHrine (EPIPEN 2-PAK) 0.3 mg/0.3 mL IJ SOAJ injection Inject 0.3 mg into the muscle as needed for anaphylaxis. Patient not taking: Reported on 09/28/2023 02/24/22   Ellamae Sia, DO  escitalopram (LEXAPRO) 10 MG tablet Take  1 tablet  Daily for Mood                                                               /                                                                   TAKE                                         BY                                                 MOUTH 07/11/23   Lucky Cowboy, MD  fenofibrate micronized (LOFIBRA) 134 MG capsule Take 1 capsule Daily for Triglycerides ( Blood Fats)                                                 /                                                                   TAKE                                         BY                                                 MOUTH                     ONCE ? ? ?  DAILY 09/05/23   Lucky Cowboy, MD  fexofenadine [facility]) 180 MG tablet Take by mouth. 04/10/20   [provider]  fluconazole (DIFLUCAN) 150 MG tablet Take 1 tab day 1 and second tab on day 4 Patient not taking: Reported on 09/28/2023 07/28/23   Raynelle Dick, NP  fluticasone [facility]) 50 MCG/ACT nasal spray Place 1 spray into both nostrils 2 (two) times daily as needed (nasal congestion). 09/28/22   Ellamae Sia, DO  fluticasone furoate-vilanterol (BREO ELLIPTA) 200-25 MCG/ACT AEPB Inhale 1 puff into the lungs daily. Rinse mouth after each use. 09/22/23   Ellamae Sia, DO  ipratropium (ATROVENT) 0.06 % nasal spray Use 1 to 2 sprays each nostril 2 to 3 x /day as needed 11/30/22   Lucky Cowboy, MD  meloxicam (MOBIC) 15 MG tablet Take 1/2 to 1 tablet Daily with Food for Pain & Inflammation                                                                   /                                   TAKE                                 BY                                    MOUTH                               ONCE  ?  ?  ?  DAILY 05/13/23   Lucky Cowboy, MD  montelukast (SINGULAIR) 10 MG tablet Take  1 tablet  Daily for Allergies                               /                                                                   TAKE                                         BY                                                 MOUTH ONCE ? ? ?  DAILY  09/05/23   Lucky Cowboy, MD  omeprazole (PRILOSEC) 20 MG capsule Take  1 capsule  Daily to Prevent Heartburn & Indigestion                                   /  TAKE                                         BY                                                 MOUTH 09/05/23   Lucky Cowboy, MD  promethazine-dextromethorphan (PROMETHAZINE-DM) 6.25-15 MG/5ML syrup Take 5 mLs by mouth 4 (four) times daily as needed for cough. 07/28/23   Raynelle Dick, NP  Semaglutide, 2 MG/DOSE, (OZEMPIC, 2 MG/DOSE,) 8 MG/3ML SOPN Inject 2 mg (0.75 ml)   into Skin  every 7 to 12 days for Diabetes  (Dx:  e11.29) 08/20/23   Lucky Cowboy, MD  sildenafil (VIAGRA) 100 MG tablet Take   1/2 to 1 tablet   Daily  as needed for XXXX 11/17/22   Lucky Cowboy, MD  tamsulosin (FLOMAX) 0.4 MG CAPS capsule TAKE 1 CAPSULE BY MOUTH AT BEDTIME FOR PROSTATE 08/11/23   Raynelle Dick, NP  valsartan-hydrochlorothiazide (DIOVAN-HCT) 80-12.5 MG tablet Take  1 tablet  Daily  for BP 03/26/23   Lucky Cowboy, MD  Zinc 50 MG TABS Take by mouth.    [provider]    Family History Family History  Problem Relation Age of Onset   Heart disease Mother    Macular degeneration Mother    Hypertension Mother    Hyperlipidemia Mother    Depression Mother    Anxiety disorder Mother    Liver disease Father    Alcoholism Father    Allergic rhinitis Brother    Asthma Brother    Sinusitis Brother    Bronchitis Brother    Stroke Maternal Aunt    Cancer - Colon Paternal Uncle    Cancer - Prostate Paternal Uncle    Cancer - Colon Other    Cancer - Prostate Other    Immunodeficiency Neg Hx    Urticaria Neg Hx    Eczema Neg Hx    Angioedema Neg Hx     Social History Social History   Tobacco Use   Smoking status: Former    Current packs/day: 0.00    Average packs/day: 1 pack/day for 40.0 years (40.0 ttl pk-yrs)    Types: Cigarettes    Start date:  12/03/1972    Quit date: 12/03/2012    Years since quitting: 10.8   Smokeless tobacco: Never  Vaping Use   Vaping status: Never Used  Substance Use Topics   Alcohol use: Yes    Alcohol/week: 2.0 standard drinks of alcohol    Types: 2 Standard drinks or equivalent per week    Comment: occ   Drug use: No     Allergies   Bee venom, Pine, [place] nut (berthollefia excelsa), Lisinopril, Penicillins, and Celebrex [celecoxib]   Review of Systems Review of Systems  Constitutional:  Positive for activity change. Negative for appetite change, fatigue and fever.  HENT:  Positive for congestion, postnasal drip and sinus pressure. Negative for sneezing and sore throat.   Respiratory:  Positive for cough and shortness of breath.   Cardiovascular:  Negative for chest pain.  Gastrointestinal:  Negative for abdominal pain, diarrhea, nausea and vomiting.  Neurological:  Negative for dizziness, light-headedness and headaches.     Physical Exam Triage  Vital Signs ED Triage Vitals  Encounter Vitals Group     BP 10/16/23 0812 (!) 155/67     Systolic BP Percentile --      Diastolic BP Percentile --      Pulse Rate 10/16/23 0812 84     Resp 10/16/23 0812 20     Temp 10/16/23 0812 97.9 F (36.6 C)     Temp Source 10/16/23 0812 Oral     SpO2 10/16/23 0812 92 %     Weight 10/16/23 0818 225 lb (102.1 kg)     Height 10/16/23 0818 5\' 9"  (1.753 m)     Head Circumference --      Peak Flow --      Pain Score 10/16/23 0818 0     Pain Loc --      Pain Education --      Exclude from Growth Chart --    No data found.  Updated Vital Signs BP (!) 155/67 (BP Location: Left Arm)   Pulse 76   Temp 97.9 F (36.6 C) (Oral)   Resp 18   Ht 5\' 9"  (1.753 m)   Wt 225 lb (102.1 kg)   SpO2 96%   BMI 33.23 kg/m   Visual Acuity Right Eye Distance:   Left Eye Distance:   Bilateral Distance:    Right Eye Near:   Left Eye Near:    Bilateral Near:     Physical Exam Vitals reviewed.  Constitutional:       General: He is awake.     Appearance: Normal appearance. He is well-developed. He is not ill-appearing.     Comments: Very pleasant male appears stated age in no acute distress sitting comfortably in exam room  HENT:     Head: Normocephalic and atraumatic.     Right Ear: Tympanic membrane, ear canal and external ear normal. Tympanic membrane is not erythematous or bulging.     Left Ear: Tympanic membrane, ear canal and external ear normal. Tympanic membrane is not erythematous or bulging.     Nose: Nose normal.     Mouth/Throat:     Pharynx: Uvula midline. No oropharyngeal exudate, posterior oropharyngeal erythema or uvula swelling.  Cardiovascular:     Rate and Rhythm: Normal rate and regular rhythm.     Heart sounds: Normal heart sounds, S1 normal and S2 normal. No murmur heard. Pulmonary:     Effort: Pulmonary effort is normal. No accessory muscle usage or respiratory distress.     Breath sounds: No stridor. Wheezing present. No rhonchi or rales.     Comments: Scattered wheezing.  Reactive cough with deep breathing. Neurological:     Mental Status: He is alert.  Psychiatric:        Behavior: Behavior is cooperative.      UC Treatments / Results  Labs (all labs ordered are listed, but only abnormal results are displayed) Labs Reviewed - No data to display  EKG   Radiology DG Chest 2 View Result Date: 10/16/2023 CLINICAL DATA:  66 year old male with increasing cough and congestion. EXAM: CHEST - 2 VIEW COMPARISON:  Chest radiographs 07/06/2022 and earlier. FINDINGS: PA and lateral views 0841 hours. Lung volumes and mediastinal contours remain normal. Visualized tracheal air column is within normal limits. Lung markings appear stable compared to last year, within normal limits. No pneumothorax, pleural effusion or confluent lung opacity. No acute osseous abnormality identified. Negative visible bowel gas. IMPRESSION: No acute cardiopulmonary abnormality. Electronically Signed    By: Rexene Edison  Margo Aye M.D.   On: 10/16/2023 08:47    Procedures Procedures (including critical care time)  Medications Ordered in UC Medications  ipratropium-albuterol (DUONEB) 0.5-2.5 (3) MG/3ML nebulizer solution 3 mL (3 mLs Nebulization Given 10/16/23 0855)    Initial Impression / Assessment and Plan / UC Course  I have reviewed the triage vital signs and the nursing notes.  Pertinent labs & imaging results that were available during my care of the patient were reviewed by me and considered in my medical decision making (see chart for details).     Patient is well-appearing, afebrile, nontoxic, nontachycardic.  Chest x-ray was obtained that showed no acute cardiopulmonary disease.  Viral testing was deferred as he has been symptomatic for over 5 days and outside the window of effectiveness for antiviral therapy.  Given his prolonged and recent worsening of symptoms will cover for sinobronchitis with doxycycline.  He was instructed to avoid prolonged sun exposure while on this medication due to associated photosensitivity.  I am also concerned for an asthma exacerbation given associated wheezing.  He was given a DuoNeb in clinic with improvement of symptoms but will start prednisone 20 mg daily for 4 days in addition to his previously prescribed maintenance medication (Breo Ellipta) in the hopes that this will improve his symptoms without causing significant hyperglycemia given his prediabetes.  He can continue over-the-counter medications as needed.  Recommended close follow-up with his primary care.  Discussed that if anything worsens he needs to be seen immediately.  Strict return precautions given.  Excuse note provided.  Final Clinical Impressions(s) / UC Diagnoses   Final diagnoses:  Acute cough  Sinobronchitis  Mild intermittent asthma with acute exacerbation     Discharge Instructions      Your x-ray did not show any evidence of pneumonia which is great news.  We are going to treat  for sinobronchitis given your worsening cough as well as an asthma exacerbation.  Start doxycycline 100 mg twice daily for 10 days.  Stay out of the sun while on this medication.  Take prednisone 20 mg for 4 days.  Do not take NSAIDs with this medication including aspirin, ibuprofen/Advil, naproxen/Aleve.  Continue your asthma medication including Breo Ellipta.  Follow-up with your primary care if your symptoms are not improving within a few days.  If anything worsens and you have worsening cough, high fever, chest pain, shortness of breath you need to be seen immediately.     ED Prescriptions     Medication Sig Dispense Auth. Provider   doxycycline (VIBRAMYCIN) 100 MG capsule Take 1 capsule (100 mg total) by mouth 2 (two) times daily. 20 capsule Dannika Hilgeman K, PA-C   predniSONE (DELTASONE) 20 MG tablet Take 1 tablet (20 mg total) by mouth daily for 4 days. 4 tablet Coltin Casher, Noberto Retort, PA-C      PDMP not reviewed this encounter.   Jeani Hawking, PA-C 10/16/23 1610

## 2023-10-16 NOTE — ED Triage Notes (Signed)
Patient presents with congestion, runny nose, mucus, cough, fatigue x day 4. Treated with Nettie pot, antihistamines without relief.

## 2023-10-19 ENCOUNTER — Ambulatory Visit: Payer: Medicare HMO

## 2023-10-25 ENCOUNTER — Other Ambulatory Visit: Payer: Self-pay

## 2023-10-25 ENCOUNTER — Encounter: Payer: Self-pay | Admitting: Nurse Practitioner

## 2023-10-25 ENCOUNTER — Ambulatory Visit (INDEPENDENT_AMBULATORY_CARE_PROVIDER_SITE_OTHER): Payer: Medicare Other | Admitting: Nurse Practitioner

## 2023-10-25 ENCOUNTER — Telehealth: Payer: Self-pay

## 2023-10-25 VITALS — BP 122/62 | HR 77 | Temp 97.7°F | Ht 68.5 in | Wt 238.2 lb

## 2023-10-25 DIAGNOSIS — I10 Essential (primary) hypertension: Secondary | ICD-10-CM | POA: Diagnosis not present

## 2023-10-25 DIAGNOSIS — Z1152 Encounter for screening for COVID-19: Secondary | ICD-10-CM

## 2023-10-25 DIAGNOSIS — J40 Bronchitis, not specified as acute or chronic: Secondary | ICD-10-CM | POA: Diagnosis not present

## 2023-10-25 DIAGNOSIS — E1122 Type 2 diabetes mellitus with diabetic chronic kidney disease: Secondary | ICD-10-CM

## 2023-10-25 DIAGNOSIS — R6889 Other general symptoms and signs: Secondary | ICD-10-CM | POA: Diagnosis not present

## 2023-10-25 DIAGNOSIS — N182 Chronic kidney disease, stage 2 (mild): Secondary | ICD-10-CM

## 2023-10-25 DIAGNOSIS — R110 Nausea: Secondary | ICD-10-CM

## 2023-10-25 LAB — POC COVID19 BINAXNOW: SARS Coronavirus 2 Ag: NEGATIVE

## 2023-10-25 LAB — POCT INFLUENZA A/B
Influenza A, POC: NEGATIVE
Influenza B, POC: NEGATIVE

## 2023-10-25 MED ORDER — PREDNISONE 20 MG PO TABS: ORAL_TABLET | ORAL | 0 refills | Status: AC

## 2023-10-25 MED ORDER — SEMAGLUTIDE (1 MG/DOSE) 4 MG/3ML ~~LOC~~ SOPN: 1.0000 mg | PEN_INJECTOR | SUBCUTANEOUS | 1 refills | Status: DC

## 2023-10-25 MED ORDER — ONDANSETRON HCL 4 MG PO TABS: 4.0000 mg | ORAL_TABLET | Freq: Every day | ORAL | 1 refills | Status: DC | PRN

## 2023-10-25 MED ORDER — IPRATROPIUM-ALBUTEROL 0.5-2.5 (3) MG/3ML IN SOLN: 3.0000 mL | Freq: Once | RESPIRATORY_TRACT | Status: DC

## 2023-10-25 MED ORDER — LEVOFLOXACIN 750 MG PO TABS: 750.0000 mg | ORAL_TABLET | Freq: Every day | ORAL | 0 refills | Status: AC

## 2023-10-25 NOTE — Telephone Encounter (Signed)
 Prior auth for Ozempic 1mg  completed and submitted.

## 2023-10-25 NOTE — Patient Instructions (Signed)

## 2023-10-25 NOTE — Progress Notes (Signed)
 Assessment and Plan:  George Montgomery was seen today for acute visit.  Diagnoses and all orders for this visit:  Essential hypertension - continue medications- Valsartan  - HCT 80/12.5 mg every day , DASH diet, exercise and monitor at home. Call if greater than 130/80.    Encounter for screening for COVID-19 -     POC COVID-19- negative  Flu-like symptoms -     POCT Influenza A/B- negative  Bronchitis Continue Mucinex as needed, push fluids Use inhalers and continue all other respiratory/allergy  medications. If no improvement- feel like you vcan not take a breath go to the ER -     levofloxacin  (LEVAQUIN ) 750 MG tablet; Take 1 tablet (750 mg total) by mouth daily for 7 days. -     predniSONE  (DELTASONE ) 20 MG tablet; 3 tablets daily with food for 3 days, 2 tabs daily for 3 days, 1 tab a day for 5 days.  Nausea Use Zofran  4 mg as needed with restart of Ozempic  -     ondansetron  (ZOFRAN ) 4 MG tablet; Take 1 tablet (4 mg total) by mouth daily as needed for nausea or vomiting.  Type 2 diabetes mellitus with stage 2 chronic kidney disease, without long-term current use of insulin  (HCC) Restart Ozempic  at 1 mg SQ QW for 1-2 months and then will increase back to 2 mg Continue diet and exercise -     Semaglutide , 1 MG/DOSE, 4 MG/3ML SOPN; Inject 1 mg into the skin once a week.       Further disposition pending results of labs. Discussed med's effects and SE's.   Over 30 minutes of exam, counseling, chart review, and critical decision making was performed.   Future Appointments  Date Time Provider Department Center  10/27/2023  9:30 AM [facility] NURSE [facility] None  10/29/2023 10:00 AM Dillingham, Estefana RAMAN, DO [facility] None  11/22/2023 11:30 AM Jude Lonell BRAVO, NP [facility] None  01/24/2024  8:30 AM Luke Orlan HERO, DO [facility] None  03/23/2024 11:30 AM Tonita Fallow, MD [facility] None  06/27/2024 11:00 AM Tonita Fallow, MD [facility] None     ------------------------------------------------------------------------------------------------------------------   HPI BP 122/62   Pulse 77   Temp 97.7 F (36.5 C)   Ht 5' 8.5 (1.74 m)   Wt 238 lb 3.2 oz (108 kg)   SpO2 95%   BMI 35.69 kg/m  68 y.o.male presents for symptoms of diarrhea, chills, runny nose, productive cough of green/brown phlegm, fatigue, shortness of breath and sore throat that began 10/13/23.  He was evaluated at Urgent Care- CXR 10/16/23 was normal. He was given Doxycycline  100 mg BID x 10 and Prednisone  20 mg X 4 days. Advised to continue with Breo, Singulair , Allegra , Flonase , and Atrovent  nasal spray. He is finished with steroid and has 1-2 days left of Doxycycline . He feels a bit better but but persistent productive green/brown mucus. He can also get clear mucus from his nose.   He did have back pain from coughing and was given Gabapentin  and muscle relaxant which caused him to be very nauseated and throw up.  He is concerned because he could not drive for an appointment and we do not do phone visits or virtual visits.  Explained the necessity of being evaluated in person and the protocol of our office.  He is advised he can also call his insurance for a virtual visit if necessary.  Due to nausea/vomiting he is not taking Ozempic .  Will need to restart at a lower dose  BP well controlled with  Diovan  HCT 80/12.5 mg QD BP Readings from Last 3 Encounters:  10/25/23 122/62  10/16/23 (!) 155/67  09/28/23 121/69  Denies headaches, chest pain, shortness of breath and dizziness   BMI is Body mass index is 35.69 kg/m., he has not been working on diet and exercise. Wt Readings from Last 3 Encounters:  10/25/23 238 lb 3.2 oz (108 kg)  10/16/23 225 lb (102.1 kg)  08/20/23 233 lb 12.8 oz (106.1 kg)   On Ozempic  2 mg SQ QW but has been off for several weeks due to illness.  Lab Results  Component Value Date   HGBA1C 6.0 (H) 08/20/2023     Past Medical History:   Diagnosis Date   Arthritis    Asthma    Back pain    Chronic pain of both shoulders    Depression    Dyspnea    Elevated glucose    Environmental allergies    GERD (gastroesophageal reflux disease)    HLD (hyperlipidemia)    HTN (hypertension)    Insomnia    Obesity    OSA on CPAP 08/01/2014   Reflux    Trigger finger      Allergies  Allergen Reactions   Bee Venom Anaphylaxis   Pine Anaphylaxis   [place] Nut (Berthollefia Excelsa)     pruritus   Lisinopril  Other (See Comments)    Patient intolerant of drug which caused his tongue to tingle.   Penicillins Itching   Celebrex  [Celecoxib ]     Current Outpatient Medications on File Prior to Visit  Medication Sig   albuterol  (VENTOLIN  HFA) 108 (90 Base) MCG/ACT inhaler Inhale 2 puffs into the lungs every 4 (four) hours as needed for wheezing or shortness of breath (coughing fits).   cetirizine  (ZYRTEC  ALLERGY ) 10 MG tablet Take 1 tablet (10 mg total) by mouth daily.   doxycycline  (VIBRAMYCIN ) 100 MG capsule Take 1 capsule (100 mg total) by mouth 2 (two) times daily.   EPINEPHrine  (EPIPEN  2-PAK) 0.3 mg/0.3 mL IJ SOAJ injection Inject 0.3 mg into the muscle as needed for anaphylaxis.   escitalopram  (LEXAPRO ) 10 MG tablet Take  1 tablet  Daily for Mood                                                              /                                                                   TAKE                                         BY                                                 MOUTH   fenofibrate  micronized (LOFIBRA) 134 MG capsule Take 1  capsule Daily for Triglycerides ( Blood Fats)                                                 /                                                                   TAKE                                         BY                                                 MOUTH                     ONCE ? ? ?  DAILY   fexofenadine  (ALLEGRA ) 180 MG tablet Take by mouth.   fluticasone  (FLONASE ) 50 MCG/ACT nasal spray  Place 1 spray into both nostrils 2 (two) times daily as needed (nasal congestion).   fluticasone  furoate-vilanterol (BREO ELLIPTA ) 200-25 MCG/ACT AEPB Inhale 1 puff into the lungs daily. Rinse mouth after each use.   ipratropium (ATROVENT ) 0.06 % nasal spray Use 1 to 2 sprays each nostril 2 to 3 x /day as needed   meloxicam  (MOBIC ) 15 MG tablet Take 1/2 to 1 tablet Daily with Food for Pain & Inflammation                                                                   /                                   TAKE                                 BY                                    MOUTH                               ONCE  ?  ?  ?  DAILY   montelukast  (SINGULAIR ) 10 MG tablet Take  1 tablet  Daily for Allergies                               /  TAKE                                         BY                                                 MOUTH ONCE ? ? ?  DAILY   omeprazole  (PRILOSEC) 20 MG capsule Take  1 capsule  Daily to Prevent Heartburn & Indigestion                                   /                                                                   TAKE                                         BY                                                 MOUTH   promethazine -dextromethorphan (PROMETHAZINE -DM) 6.25-15 MG/5ML syrup Take 5 mLs by mouth 4 (four) times daily as needed for cough.   Semaglutide , 2 MG/DOSE, (OZEMPIC , 2 MG/DOSE,) 8 MG/3ML SOPN Inject 2 mg (0.75 ml)   into Skin  every 7 to 12 days for Diabetes  (Dx:  e11.29)   sildenafil  (VIAGRA ) 100 MG tablet Take   1/2 to 1 tablet   Daily  as needed for XXXX   tamsulosin  (FLOMAX ) 0.4 MG CAPS capsule TAKE 1 CAPSULE BY MOUTH AT BEDTIME FOR PROSTATE   valsartan -hydrochlorothiazide  (DIOVAN -HCT) 80-12.5 MG tablet Take  1 tablet  Daily  for BP   Zinc 50 MG TABS Take by mouth.   bisoprolol  (ZEBETA ) 10 MG tablet TAKE 1 TABLET BY MOUTH EVERY DAY (Patient not taking: Reported on 10/25/2023)    fluconazole  (DIFLUCAN ) 150 MG tablet Take 1 tab day 1 and second tab on day 4 (Patient not taking: Reported on 10/25/2023)   No current facility-administered medications on file prior to visit.    ROS: all negative except above.   Physical Exam:  BP 122/62   Pulse 77   Temp 97.7 F (36.5 C)   Ht 5' 8.5 (1.74 m)   Wt 238 lb 3.2 oz (108 kg)   SpO2 95%   BMI 35.69 kg/m   General Appearance: Well nourished, in no apparent distress. Eyes: PERRLA, EOMs, conjunctiva no swelling or erythema Sinuses: No Frontal/maxillary tenderness ENT/Mouth: Ext aud canals clear, TMs without erythema, bulging. No erythema, swelling, or exudate on post pharynx.  Hearing normal.  Neck: Supple, thyroid  normal.  Respiratory: Respiratory effort normal, BS mild crackles at bases. No wheezing.  Cardio: RRR with no MRGs. Brisk peripheral pulses without edema.  Abdomen: Soft, + BS.  Non tender, no guarding, rebound, hernias, masses. Lymphatics: Non tender without lymphadenopathy.  Musculoskeletal: Full ROM, 5/5 strength, normal gait.  Skin: Warm, dry without rashes, lesions, ecchymosis.  Neuro: Cranial nerves intact. Normal muscle tone, no cerebellar symptoms. Sensation intact.  Psych: Awake and oriented X 3, normal affect, Insight and Judgment appropriate.     Rockell Faulks E Ole Lafon, NP 10:19 AM [facility]

## 2023-10-27 ENCOUNTER — Ambulatory Visit: Payer: Medicare Other

## 2023-10-29 ENCOUNTER — Telehealth: Payer: Self-pay | Admitting: Plastic Surgery

## 2023-10-29 ENCOUNTER — Ambulatory Visit: Payer: Self-pay | Admitting: Plastic Surgery

## 2023-10-29 NOTE — Telephone Encounter (Signed)
 Sent a message to Linde Gillis our billing lady, to refund the 09-28-23 Laser BBL visit. I also called pt, he did not answer and had to leave a vmail to make him aware of refund. Thank you

## 2023-11-02 ENCOUNTER — Encounter (HOSPITAL_COMMUNITY): Payer: Self-pay | Admitting: Anesthesiology

## 2023-11-02 ENCOUNTER — Encounter: Payer: Self-pay | Admitting: Acute Care

## 2023-11-02 ENCOUNTER — Ambulatory Visit: Payer: Medicare Other | Admitting: Nurse Practitioner

## 2023-11-02 ENCOUNTER — Ambulatory Visit (INDEPENDENT_AMBULATORY_CARE_PROVIDER_SITE_OTHER): Payer: Medicare Other | Admitting: *Deleted

## 2023-11-02 DIAGNOSIS — Z91038 Other insect allergy status: Secondary | ICD-10-CM

## 2023-11-02 NOTE — Progress Notes (Signed)
 Patient's chart reviewed with Dr Myer Needle. Patient has been sick with lower respiratory infection since 10-13-23. He has had 2 rounds of antibiotics and 1 round of steroids and still feel SOB and having green/brown sputum. Per protoctol he will need to reschedule surgery 8wks out from onset of symptoms. Megan at Dr [facility] office notified.

## 2023-11-09 ENCOUNTER — Ambulatory Visit (INDEPENDENT_AMBULATORY_CARE_PROVIDER_SITE_OTHER): Payer: Medicare Other | Admitting: *Deleted

## 2023-11-09 DIAGNOSIS — Z91038 Other insect allergy status: Secondary | ICD-10-CM | POA: Diagnosis not present

## 2023-11-11 ENCOUNTER — Encounter (HOSPITAL_BASED_OUTPATIENT_CLINIC_OR_DEPARTMENT_OTHER): Payer: Self-pay

## 2023-11-11 ENCOUNTER — Ambulatory Visit (HOSPITAL_BASED_OUTPATIENT_CLINIC_OR_DEPARTMENT_OTHER): Admit: 2023-11-11 | Payer: Self-pay | Admitting: Orthopedic Surgery

## 2023-11-11 SURGERY — FUSION, JOINT, GREAT TOE
Anesthesia: General | Site: Toe | Laterality: Right

## 2023-11-16 ENCOUNTER — Ambulatory Visit (INDEPENDENT_AMBULATORY_CARE_PROVIDER_SITE_OTHER): Payer: Self-pay | Admitting: Plastic Surgery

## 2023-11-16 ENCOUNTER — Encounter: Payer: Self-pay | Admitting: Plastic Surgery

## 2023-11-16 ENCOUNTER — Ambulatory Visit (INDEPENDENT_AMBULATORY_CARE_PROVIDER_SITE_OTHER): Payer: Medicare Other | Admitting: *Deleted

## 2023-11-16 VITALS — BP 138/66 | HR 85

## 2023-11-16 DIAGNOSIS — L719 Rosacea, unspecified: Secondary | ICD-10-CM

## 2023-11-16 DIAGNOSIS — Z91038 Other insect allergy status: Secondary | ICD-10-CM

## 2023-11-16 NOTE — Progress Notes (Signed)
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  He is looking really good and is ready for the next step.  We talked about how we are going to do that and that he is can have a good 2 weeks downtime.  We will plan for February 20 and will use numbing medicine.

## 2023-11-17 ENCOUNTER — Ambulatory Visit (INDEPENDENT_AMBULATORY_CARE_PROVIDER_SITE_OTHER): Payer: Medicare Other | Admitting: Student

## 2023-11-17 DIAGNOSIS — L719 Rosacea, unspecified: Secondary | ICD-10-CM

## 2023-11-17 NOTE — Progress Notes (Signed)
Preoperative Dx: Rosacea  Postoperative Dx:  same  Procedure: laser to face   Anesthesia: Benzocaine lidocaine tetracaine numbing cream  Description of Procedure:  Risks and complications were explained to the patient. Consent was confirmed. Time out was called and all information was confirmed to be correct.  Patient gave consent to laser his cheeks and his lower cheeks.  The area  area was prepped with alcohol and wiped dry.  Ultrasound jelly was applied to the face. BBL heroic laser was set at 2.5 J/cm2 using 532 nm filter with 3 width and 25 degrees Celsius. The face was lasered.  Care was taken to not laser over his beard.  The patient tolerated the procedure well and there were no complications. The patient is to follow up in 4 weeks.

## 2023-11-18 ENCOUNTER — Telehealth: Payer: Self-pay

## 2023-11-18 ENCOUNTER — Ambulatory Visit: Payer: Medicare Other | Admitting: Nurse Practitioner

## 2023-11-18 NOTE — Telephone Encounter (Signed)
I called the patient to check in one day post laser treatment. Patient states he is doing well and I told him to call or send [place] a message via MyChart if anything changes or he has questions.

## 2023-11-21 ENCOUNTER — Other Ambulatory Visit: Payer: Self-pay | Admitting: Nurse Practitioner

## 2023-11-21 DIAGNOSIS — N401 Enlarged prostate with lower urinary tract symptoms: Secondary | ICD-10-CM

## 2023-11-22 ENCOUNTER — Ambulatory Visit: Payer: Medicare Other | Admitting: Nurse Practitioner

## 2023-11-22 ENCOUNTER — Other Ambulatory Visit: Payer: Self-pay | Admitting: Nurse Practitioner

## 2023-11-22 DIAGNOSIS — I10 Essential (primary) hypertension: Secondary | ICD-10-CM

## 2023-11-23 ENCOUNTER — Ambulatory Visit (INDEPENDENT_AMBULATORY_CARE_PROVIDER_SITE_OTHER): Payer: Medicare Other

## 2023-11-23 DIAGNOSIS — Z91038 Other insect allergy status: Secondary | ICD-10-CM | POA: Diagnosis not present

## 2023-12-01 ENCOUNTER — Ambulatory Visit
Admission: RE | Admit: 2023-12-01 | Discharge: 2023-12-01 | Disposition: A | Discharge status: Home / Self Care | Payer: Medicare Other | Source: Ambulatory Visit

## 2023-12-01 ENCOUNTER — Ambulatory Visit (INDEPENDENT_AMBULATORY_CARE_PROVIDER_SITE_OTHER): Payer: Medicare Other

## 2023-12-01 VITALS — BP 149/73 | HR 74 | Temp 97.9°F | Resp 18

## 2023-12-01 DIAGNOSIS — R5383 Other fatigue: Secondary | ICD-10-CM

## 2023-12-01 LAB — POCT URINALYSIS DIP (MANUAL ENTRY)
Bilirubin, UA: NEGATIVE
Blood, UA: NEGATIVE
Glucose, UA: NEGATIVE mg/dL
Ketones, POC UA: NEGATIVE mg/dL
Leukocytes, UA: NEGATIVE
Nitrite, UA: NEGATIVE
Protein Ur, POC: NEGATIVE mg/dL
Spec Grav, UA: 1.02
Urobilinogen, UA: 0.2 U/dL
pH, UA: 7

## 2023-12-01 NOTE — ED Provider Notes (Signed)
[name]    CSN: [id number] Arrival date & time: 12/01/23  1051      History   Chief Complaint Chief Complaint  Patient presents with   Fatigue    Been sick since December 25th, much better but no energy. - Entered by patient   Numbness    HPI George Montgomery is a 68 y.o. male.   Patient presents today with worsening fatigue.  He was seen 10/16/2023 at which point he was diagnosed with sinobronchitis and prescribed doxycycline as well as prednisone.  His symptoms significantly improved following this medication and he reports that his cough is 95% better.  He was feeling well until about 3 weeks ago and now he is having difficulty with even his basic activities because of exhaustion.  He is sleeping 8 to 10 hours at night and also taking several naps during the day but continues to feel poorly.  He does wake up at night because of urinary frequency but this is chronic and unchanged from baseline.  He believes that he is sleeping well; uses a CPAP and has not noticed any significant change in his sleep patterns.  He denies any medication changes outside of what was prescribed at his previous visit.  He denies any known blood loss including melena, hematochezia, abnormal bruising.  He is up-to-date on cancer screenings and reports that he is scheduled for 10-year colonoscopy next year.  He is eating and drinking normally.  He denies any additional symptoms including chest pain, shortness of breath, palpitations, focal weakness, fever, unintentional weight loss.    Past Medical History:  Diagnosis Date   Arthritis    Asthma    Back pain    Chronic pain of both shoulders    Depression    Dyspnea    Elevated glucose    Environmental allergies    GERD (gastroesophageal reflux disease)    HLD (hyperlipidemia)    HTN (hypertension)    Insomnia    Obesity    OSA on CPAP 08/01/2014   Reflux    Trigger finger     Patient Active Problem List   Diagnosis Date Noted    Rosacea 06/25/2023   Allergy to alpha-gal 05/10/2023   Rhinophyma 02/02/2023   Penicillin allergy 10/14/2022   Moderate persistent asthma without complication 07/27/2022   Seasonal and perennial allergic rhinitis 02/24/2022   Anaphylactic reaction due to food, subsequent encounter 02/24/2022   Hymenoptera allergy 02/24/2022   Thoracic aortic atherosclerosis (HCC) by Chest CT scan on10/21/2020. 11/13/2020   Insulin resistance 11/13/2020   Fatty liver 02/14/2020   Benign prostatic hyperplasia (BPH) with post-void dribbling 02/14/2020   Gastroesophageal reflux disease 06/02/2018   Insomnia 02/22/2018   Chronic pain of both shoulders 11/22/2017   Depression 07/20/2017   Trigger finger, right ring finger 04/07/2017   Hyperlipidemia, mixed 08/06/2015   Essential hypertension 11/06/2014   Cluster headaches 11/06/2014   OSA treated with BiPAP 11/06/2014   Vitamin D deficiency 11/06/2014   Testosterone deficiency 11/06/2014   Morbid obesity (HCC) 11/06/2014    Past Surgical History:  Procedure Laterality Date   APPENDECTOMY  07/2012   bone spur removal     HEMORRHOID SURGERY  09/2012   HERNIA REPAIR         Home Medications    Prior to Admission medications   Medication Sig Start Date End Date Taking? Authorizing Provider  albuterol (VENTOLIN HFA) 108 (90 Base) MCG/ACT inhaler Inhale 2 puffs into the lungs every 4 (four)  hours as needed for wheezing or shortness of breath (coughing fits). 09/22/23  Yes Ellamae Sia, DO  fenofibrate micronized (LOFIBRA) 134 MG capsule Take 1 capsule Daily for Triglycerides ( Blood Fats)                                                 /                                                                   TAKE                                         BY                                                 MOUTH                     ONCE ? ? ?  DAILY 09/05/23  Yes Lucky Cowboy, MD  fluticasone furoate-vilanterol (BREO ELLIPTA) 200-25 MCG/ACT AEPB Inhale 1 puff  into the lungs daily. Rinse mouth after each use. 09/22/23  Yes Ellamae Sia, DO  meloxicam (MOBIC) 15 MG tablet Take 1/2 to 1 tablet Daily with Food for Pain & Inflammation                                                                   /                                   TAKE                                 BY                                    MOUTH                               ONCE  ?  ?  ?  DAILY 05/13/23  Yes Lucky Cowboy, MD  montelukast (SINGULAIR) 10 MG tablet Take  1 tablet  Daily for Allergies                               /  TAKE                                         BY                                                 MOUTH ONCE ? ? ?  DAILY 09/05/23  Yes Lucky Cowboy, MD  omeprazole (PRILOSEC) 20 MG capsule Take  1 capsule  Daily to Prevent Heartburn & Indigestion                                   /                                                                   TAKE                                         BY                                                 MOUTH 09/05/23  Yes Lucky Cowboy, MD  Semaglutide, 1 MG/DOSE, 4 MG/3ML SOPN Inject 1 mg into the skin once a week. 10/25/23  Yes Raynelle Dick, NP  tamsulosin (FLOMAX) 0.4 MG CAPS capsule TAKE 1 CAPSULE BY MOUTH AT BEDTIME FOR PROSTATE 11/22/23  Yes Cranford, Tonya, NP  valsartan-hydrochlorothiazide (DIOVAN-HCT) 80-12.5 MG tablet TAKE 1 TABLET BY MOUTH DAILY FOR BLOOD PRESSURE 11/22/23  Yes Cranford, Tonya, NP  bisoprolol (ZEBETA) 10 MG tablet TAKE 1 TABLET BY MOUTH EVERY DAY Patient not taking: Reported on 12/01/2023 09/16/23   Adela Glimpse, NP  celecoxib (CELEBREX) 100 MG capsule Take 100 mg by mouth 2 (two) times daily. Patient not taking: Reported on 12/01/2023 09/15/23   [provider]  cetirizine (ZYRTEC ALLERGY) 10 MG tablet Take 1 tablet (10 mg total) by mouth daily. Patient not taking: Reported on 12/01/2023 05/18/21   Alyissa Whidbee, Noberto Retort, PA-C   doxycycline (VIBRAMYCIN) 100 MG capsule Take 1 capsule (100 mg total) by mouth 2 (two) times daily. Patient not taking: Reported on 12/01/2023 10/16/23   Suraya Vidrine, Noberto Retort, PA-C  EPINEPHrine (EPIPEN 2-PAK) 0.3 mg/0.3 mL IJ SOAJ injection Inject 0.3 mg into the muscle as needed for anaphylaxis. 02/24/22   Ellamae Sia, DO  escitalopram (LEXAPRO) 10 MG tablet Take  1 tablet  Daily for Mood                                                              /  TAKE                                         BY                                                 MOUTH Patient not taking: Reported on 12/01/2023 07/11/23   Lucky Cowboy, MD  fexofenadine (ALLEGRA) 180 MG tablet Take by mouth. Patient not taking: Reported on 12/01/2023 04/10/20   [provider]  fluconazole (DIFLUCAN) 150 MG tablet Take 1 tab day 1 and second tab on day 4 Patient not taking: Reported on 12/01/2023 07/28/23   Raynelle Dick, NP  fluticasone [facility]) 50 MCG/ACT nasal spray Place 1 spray into both nostrils 2 (two) times daily as needed (nasal congestion). 09/28/22   Ellamae Sia, DO  gabapentin (NEURONTIN) 100 MG capsule Take 100 mg by mouth daily. Patient not taking: Reported on 12/01/2023 09/15/23   [provider]  ipratropium (ATROVENT) 0.06 % nasal spray Use 1 to 2 sprays each nostril 2 to 3 x /day as needed Patient not taking: Reported on 12/01/2023 11/30/22   Lucky Cowboy, MD  ondansetron (ZOFRAN) 4 MG tablet Take 1 tablet (4 mg total) by mouth daily as needed for nausea or vomiting. 10/25/23 10/24/24  Raynelle Dick, NP  promethazine-dextromethorphan (PROMETHAZINE-DM) 6.25-15 MG/5ML syrup Take 5 mLs by mouth 4 (four) times daily as needed for cough. Patient not taking: Reported on 12/01/2023 07/28/23   Raynelle Dick, NP  sildenafil (VIAGRA) 100 MG tablet Take   1/2 to 1 tablet   Daily  as needed for XXXX 11/17/22   Lucky Cowboy, MD  SODIUM FLUORIDE  5000 PPM 1.1 % GEL dental gel BRUSH PEA-SIZED AMOUNT TWICE A DAY AND SPIT OUT. DO NOT RINSE/EAT/DRINK/FOR 30 MINUTES AFTER Patient not taking: Reported on 12/01/2023 09/08/23   [provider]  tiZANidine (ZANAFLEX) 2 MG tablet Take 2 mg by mouth 2 (two) times daily. 09/15/23   [provider]  Zinc 50 MG TABS Take by mouth.    [provider]    Family History Family History  Problem Relation Age of Onset   Heart disease Mother    Macular degeneration Mother    Hypertension Mother    Hyperlipidemia Mother    Depression Mother    Anxiety disorder Mother    Liver disease Father    Alcoholism Father    Allergic rhinitis Brother    Asthma Brother    Sinusitis Brother    Bronchitis Brother    Stroke Maternal Aunt    Cancer - Colon Paternal Uncle    Cancer - Prostate Paternal Uncle    Cancer - Colon Other    Cancer - Prostate Other    Immunodeficiency Neg Hx    Urticaria Neg Hx    Eczema Neg Hx    Angioedema Neg Hx     Social History Social History   Tobacco Use   Smoking status: Former    Current packs/day: 0.00    Average packs/day: 1 pack/day for 40.0 years (40.0 ttl pk-yrs)    Types: Cigarettes    Start date: 12/03/1972    Quit date: 12/03/2012    Years since quitting: 11.0   Smokeless tobacco:  Never  Vaping Use   Vaping status: Never Used  Substance Use Topics   Alcohol use: Yes    Alcohol/week: 2.0 standard drinks of alcohol    Types: 2 Standard drinks or equivalent per week    Comment: occ   Drug use: No     Allergies   Bee venom, Pine, Peanut-containing drug products, [place] nut (berthollefia excelsa), Gabapentin, Lisinopril, Penicillins, and Celebrex [celecoxib]   Review of Systems Review of Systems  Constitutional:  Positive for activity change and fatigue. Negative for appetite change, fever and unexpected weight change.  Respiratory:  Positive for cough (Significantly improved). Negative for shortness of breath.    Cardiovascular:  Negative for chest pain.  Gastrointestinal:  Negative for abdominal pain, diarrhea, nausea and vomiting.  Neurological:  Negative for dizziness, weakness, light-headedness, numbness and headaches.     Physical Exam Triage Vital Signs ED Triage Vitals  Encounter Vitals Group     BP 12/01/23 1127 (!) 149/73     Systolic BP Percentile --      Diastolic BP Percentile --      Pulse Rate 12/01/23 1127 74     Resp 12/01/23 1127 18     Temp 12/01/23 1127 97.9 F (36.6 C)     Temp Source 12/01/23 1127 Oral     SpO2 12/01/23 1127 95 %     Weight --      Height --      Head Circumference --      Peak Flow --      Pain Score 12/01/23 1128 0     Pain Loc --      Pain Education --      Exclude from Growth Chart --    No data found.  Updated Vital Signs BP (!) 149/73 (BP Location: Left Arm)   Pulse 74   Temp 97.9 F (36.6 C) (Oral)   Resp 18   SpO2 95%   Visual Acuity Right Eye Distance:   Left Eye Distance:   Bilateral Distance:    Right Eye Near:   Left Eye Near:    Bilateral Near:     Physical Exam Vitals reviewed.  Constitutional:      General: He is awake.     Appearance: Normal appearance. He is well-developed. He is not ill-appearing.     Comments: Very pleasant male appears stated age in no acute distress sitting comfortably in exam room  HENT:     Head: Normocephalic and atraumatic.     Right Ear: Tympanic membrane, ear canal and external ear normal. Tympanic membrane is not erythematous or bulging.     Left Ear: Tympanic membrane, ear canal and external ear normal. Tympanic membrane is not erythematous or bulging.     Nose: Nose normal.     Mouth/Throat:     Pharynx: Uvula midline. No oropharyngeal exudate, posterior oropharyngeal erythema or uvula swelling.  Eyes:     Extraocular Movements: Extraocular movements intact.     Conjunctiva/sclera: Conjunctivae normal.  Cardiovascular:     Rate and Rhythm: Normal rate and regular rhythm.      Heart sounds: Normal heart sounds, S1 normal and S2 normal. No murmur heard. Pulmonary:     Effort: Pulmonary effort is normal. No accessory muscle usage or respiratory distress.     Breath sounds: No stridor. Examination of the right-lower field reveals decreased breath sounds. Decreased breath sounds present. No wheezing, rhonchi or rales.  Abdominal:     General: Bowel sounds are  normal.     Palpations: Abdomen is soft.     Tenderness: There is no abdominal tenderness. There is no right CVA tenderness, left CVA tenderness, guarding or rebound.  Skin:    General: Skin is warm.     Coloration: Skin is not pale.  Neurological:     Mental Status: He is alert.  Psychiatric:        Behavior: Behavior is cooperative.      UC Treatments / Results  Labs (all labs ordered are listed, but only abnormal results are displayed) Labs Reviewed  POCT URINALYSIS DIP (MANUAL ENTRY) - Normal  CBC WITH DIFFERENTIAL/PLATELET  COMPREHENSIVE METABOLIC PANEL  TSH  VITAMIN B12  SEDIMENTATION RATE    EKG   Radiology No results found.  Procedures Procedures (including critical care time)  Medications Ordered in UC Medications - No data to display  Initial Impression / Assessment and Plan / UC Course  I have reviewed the triage vital signs and the nursing notes.  Pertinent labs & imaging results that were available during my care of the patient were reviewed by me and considered in my medical decision making (see chart for details).     Patient is well-appearing, afebrile, nontoxic, nontachycardic.  No evidence of acute infection on physical exam.  Chest x-ray was obtained that showed no acute cardiopulmonary disease based on my primary read.  We were waiting for radiologist overread at the time of discharge and we will contact him if this differs and changes our treatment plan.  UA was normal with no evidence of dehydration.  Basic blood work including CBC, CMP, thyroid, B12, ESR obtained  and pending.  We will contact him if this is abnormal and changes our treatment plan.  He does have a history of OSA and was encouraged to contact his sleep provider to determine if there is additional information that can be gathered from his CPAP or if there needs to be an adjustment.  Also recommended close follow-up with primary care.  We discussed that if he has any changing or worsening symptoms he needs to be seen immediately including chest pain, shortness of breath melena, hematochezia, hematemesis, focal weakness he is to be seen emergently.  Strict return precautions given.   Final Clinical Impressions(s) / UC Diagnoses   Final diagnoses:  Fatigue, unspecified type     Discharge Instructions      I did not see anything on your x-ray that was concerning for pneumonia.  I will contact you if the radiologist sees something that I did not.  Your urine was normal.  I will contact you if your blood work is abnormal.  I would contact your sleep provider to determine if there is additional information that can be obtained from your CPAP or if there needs to be an adjustment.  If you have any worsening or changing symptoms including shortness of breath, chest pain, feeling like you are in a pass out, blood in your stool, blood in your urine, weakness you need to be seen immediately.  Follow-up with your primary care first thing next week.     ED Prescriptions   None    PDMP not reviewed this encounter.   Jeani Hawking, PA-C 12/01/23 1233

## 2023-12-01 NOTE — ED Triage Notes (Signed)
Pt reports excessive fatigue with no energy x2 months. Pt was diagnosed with sinobronchitis in Dec 2024 and felt fully recovered around 3 weeks ago. Reports he is tired all day with no motivation and taking multiple naps. Has trouble falling asleep at night and reports poor quality sleep.   Also concerned about worsening numbness in upper legs that is making it difficult to walk at times x several weeks. Pt was seen by primary care and diagnosed with peripheral neuropathy about a year ago.

## 2023-12-01 NOTE — Discharge Instructions (Addendum)
I did not see anything on your x-ray that was concerning for pneumonia.  I will contact you if the radiologist sees something that I did not.  Your urine was normal.  I will contact you if your blood work is abnormal.  I would contact your sleep provider to determine if there is additional information that can be obtained from your CPAP or if there needs to be an adjustment.  If you have any worsening or changing symptoms including shortness of breath, chest pain, feeling like you are in a pass out, blood in your stool, blood in your urine, weakness you need to be seen immediately.  Follow-up with your primary care first thing next week.

## 2023-12-02 LAB — COMPREHENSIVE METABOLIC PANEL
ALT: 38 [IU]/L (ref 0–44)
AST: 21 [IU]/L (ref 0–40)
Albumin: 4.5 g/dL (ref 3.9–4.9)
Alkaline Phosphatase: 100 [IU]/L (ref 44–121)
BUN/Creatinine Ratio: 19 (ref 10–24)
BUN: 17 mg/dL (ref 8–27)
Bilirubin Total: 0.3 mg/dL (ref 0.0–1.2)
CO2: 22 mmol/L (ref 20–29)
Calcium: 9.2 mg/dL (ref 8.6–10.2)
Chloride: 103 mmol/L (ref 96–106)
Creatinine, Ser: 0.89 mg/dL (ref 0.76–1.27)
Globulin, Total: 2.3 g/dL (ref 1.5–4.5)
Glucose: 146 mg/dL — ABNORMAL HIGH (ref 70–99)
Potassium: 4.1 mmol/L (ref 3.5–5.2)
Sodium: 140 mmol/L (ref 134–144)
Total Protein: 6.8 g/dL (ref 6.0–8.5)
eGFR: 93 mL/min/{1.73_m2} (ref >59)

## 2023-12-02 LAB — CBC WITH DIFFERENTIAL/PLATELET
Basophils Absolute: 0 10*3/uL (ref 0.0–0.2)
Basos: 1 %
EOS (ABSOLUTE): 0.1 10*3/uL (ref 0.0–0.4)
Eos: 2 %
Hematocrit: 43.4 % (ref 37.5–51.0)
Hemoglobin: 15.2 g/dL (ref 13.0–17.7)
Immature Grans (Abs): 0 10*3/uL (ref 0.0–0.1)
Immature Granulocytes: 0 %
Lymphocytes Absolute: 1.9 10*3/uL (ref 0.7–3.1)
Lymphs: 29 %
MCH: 33.2 pg — ABNORMAL HIGH (ref 26.6–33.0)
MCHC: 35 g/dL (ref 31.5–35.7)
MCV: 95 fL (ref 79–97)
Monocytes Absolute: 0.4 10*3/uL (ref 0.1–0.9)
Monocytes: 7 %
Neutrophils Absolute: 4.2 10*3/uL (ref 1.4–7.0)
Neutrophils: 61 %
Platelets: 211 10*3/uL (ref 150–450)
RBC: 4.58 x10E6/uL (ref 4.14–5.80)
RDW: 12.6 % (ref 11.6–15.4)
WBC: 6.6 10*3/uL (ref 3.4–10.8)

## 2023-12-02 LAB — VITAMIN B12: Vitamin B-12: 465 pg/mL (ref 232–1245)

## 2023-12-02 LAB — SEDIMENTATION RATE: Sed Rate: 9 mm/h (ref 0–30)

## 2023-12-02 LAB — TSH: TSH: 1.18 u[IU]/mL (ref 0.450–4.500)

## 2023-12-07 ENCOUNTER — Ambulatory Visit (INDEPENDENT_AMBULATORY_CARE_PROVIDER_SITE_OTHER): Payer: Medicare Other | Admitting: *Deleted

## 2023-12-07 DIAGNOSIS — Z91038 Other insect allergy status: Secondary | ICD-10-CM | POA: Diagnosis not present

## 2023-12-09 ENCOUNTER — Ambulatory Visit: Payer: Self-pay | Admitting: Plastic Surgery

## 2023-12-09 DIAGNOSIS — L711 Rhinophyma: Secondary | ICD-10-CM

## 2023-12-09 MED ORDER — OXYCODONE HCL ER 10 MG PO T12A: 10.0000 mg | EXTENDED_RELEASE_TABLET | Freq: Two times a day (BID) | ORAL | 0 refills | Status: AC

## 2023-12-09 NOTE — Progress Notes (Signed)
 Procedure Note  Preoperative Dx: rhinophyma  Postoperative Dx: Same  Procedure: Ablation to nose for rhinophyma  Anesthesia: Lidocaine 1% with 1:100,000 epinephrine   Description of Procedure: Risks and complications were explained to the patient.  Consent was confirmed and the patient understands the risks and benefits.  The potential complications and alternatives were explained and the patient consents.  The patient expressed understanding the option of not having the procedure and the risks of a scar.  Time out was called and all information was confirmed to be correct.    The area was prepped and drapped.  Lidocaine 1% with epinephrine was injected in the subcutaneous area.  After waiting several minutes for the local to take we began.  The TRL was set at 200 mm. We were able to get to bleeding.  Pressure was held.  The donated myriad was then applied.  A dressing was applied.  The patient was given instructions on how to care for the area and a follow up appointment.  George Montgomery tolerated the procedure well and there were no complications.

## 2023-12-10 ENCOUNTER — Ambulatory Visit (INDEPENDENT_AMBULATORY_CARE_PROVIDER_SITE_OTHER): Payer: Self-pay | Admitting: Student

## 2023-12-10 DIAGNOSIS — L711 Rhinophyma: Secondary | ICD-10-CM

## 2023-12-10 NOTE — Progress Notes (Signed)
 Patient is a 68 year old male with history of rhinophyma.  He underwent TRL laser with Dr. Ulice Bold yesterday, 12/09/2023.  He presents to the clinic today for postprocedural follow-up.  Patient is accompanied by his wife today.  He reports he is doing overall very well.  He states that he only had to take 1 pain pill last night.  States otherwise pain has been controlled.  He does state that he has had some oozing from the right side of his nose last night and this morning.  He denies any other issues or concerns at this time.  On exam, patient is sitting upright in no acute distress.  Gauze dressings to the nose were removed.  Adaptic was in place.  There was a little bit of active bleeding on exam, this was stopped after pressure was held for several minutes.  Otherwise there is no bleeding or oozing noted.  No significant tenderness to palpation.  Vashe soaked gauze were placed over the Adaptic for several minutes.  The gauze were removed and K-Y jelly followed by Telfa and Medipore tape were placed over the nose.  Patient tolerated well.  Discussed with the patient and the patient's wife that I would like them to allow Vashe soaked gauze to sit on the nose for 5 minutes/day.  They may remove these and apply K-Y jelly, Telfa and tape.  Would like them to do dressing changes 1-2 times a day.  Patient and patient's wife expressed understanding.  We will have the patient return to the clinic early next week for reevaluation.  Instructed him to call in the meantime if he has any questions or concerns about anything.

## 2023-12-14 ENCOUNTER — Ambulatory Visit: Payer: Self-pay | Admitting: Plastic Surgery

## 2023-12-14 VITALS — BP 125/66 | HR 77

## 2023-12-14 DIAGNOSIS — L711 Rhinophyma: Secondary | ICD-10-CM

## 2023-12-14 NOTE — Progress Notes (Signed)
 The patient is a 68 year old male here for follow-up after a procedure on his rhinophyma.  The Adaptic is in place very nicely.  I am going to leave that for another week.  He is going to continue with the Vashe and the [place] dressings.  Will see him back in 1 week.  Will plan on getting pictures at that time.

## 2023-12-15 ENCOUNTER — Other Ambulatory Visit: Payer: Self-pay

## 2023-12-15 MED ORDER — PHENTERMINE HCL 37.5 MG PO TABS: ORAL_TABLET | ORAL | 0 refills | Status: DC

## 2023-12-22 ENCOUNTER — Ambulatory Visit: Payer: Self-pay | Admitting: Student

## 2023-12-22 DIAGNOSIS — L711 Rhinophyma: Secondary | ICD-10-CM

## 2023-12-22 NOTE — Progress Notes (Signed)
 Patient is a 68 year old male with history of rhinophyma.  He underwent TRL laser with Dr. Ulice Bold on 12/10/2023.  He is about a week and a half out from his procedure.  He presents to the clinic today for further postprocedural follow-up.  Patient was last seen in the clinic on 12/14/2023.  At this visit, the Adaptic was in place very nicely and plan was to leave it on for another week.  Today, patient reports he is doing well.  He states he has not had any pain at the procedure site.  He denies any new issues or concerns.  He denies any fevers or chills.  On exam, patient is sitting upright in no acute distress.  Dressings were removed as well as the Adaptic.  Skin appears to be healing nicely.  There is some superficial exudate/scabbing noted.  There is no active drainage on exam.  No signs of infection.  Recommended that patient apply Adaptic, K-Y jelly, Telfa and tape to his nose daily.  Recommended he also soak the area on Vashe soaked gauze for at least 5 minutes every day.  Patient expressed understanding.  Discussed with patient that he may shower, but recommended that he does not scrub the area.  Patient expressed understanding.  Patient states that he is still bothered by the skin protruding from just inferior to his left nasal ala.  Recommended that he follow back up with Dr. Ulice Bold in about a month to discuss this and for possible further procedure.    Patient to follow back up next week.  Instructed him to call in the meantime if he has any questions or concerns about anything.  Pictures were obtained of the patient and placed in the chart with the patient's or guardian's permission.

## 2023-12-28 ENCOUNTER — Ambulatory Visit (INDEPENDENT_AMBULATORY_CARE_PROVIDER_SITE_OTHER): Payer: Medicare Other | Admitting: *Deleted

## 2023-12-28 DIAGNOSIS — Z91038 Other insect allergy status: Secondary | ICD-10-CM

## 2023-12-29 ENCOUNTER — Ambulatory Visit (INDEPENDENT_AMBULATORY_CARE_PROVIDER_SITE_OTHER): Payer: Self-pay | Admitting: Student

## 2023-12-29 DIAGNOSIS — L711 Rhinophyma: Secondary | ICD-10-CM

## 2023-12-29 NOTE — Progress Notes (Signed)
 Patient is a 68 year old male with history of rhinophyma. He underwent TRL laser with Dr. Ulice Bold on 12/10/2023.  Patient is about 2 and half weeks postop from his procedure.  He presents to the clinic today for postprocedural follow-up.  Patient was last seen in the clinic on 12/22/2023.  At this visit, patient was doing well.  Skin appears to be healing nicely, there was some superficial exudate/scabbing noted.  Recommended that patient apply Adaptic, K-Y jelly, Telfa and tape to his nose daily.  Today, patient reports he is doing well.  He states that he is still bothered by a bump to his left nose.  Otherwise denies any issues with healing.  On exam, patient is sitting upright in no acute distress.  Skin to the nose appears to have healed nicely.  There is a small bump noted just medial of the left ala.  No signs of infection on exam.  Recommended that he apply Vaseline to the nose once a day.  Discussed with him that if he is outside he should either cover the area or apply sunscreen.  Patient expressed understanding.  Recommended that he keep his appointment Dr. Ulice Bold in a few weeks to discuss areas that might need to be touched up further with the TRL or other interventions for the areas he is bothered by.  Patient to follow-up at his next scheduled appointment.  Instructed him to call in the meantime if he has any questions or concerns about anything.  Pictures were obtained of the patient and placed in the chart with the patient's or guardian's permission.

## 2024-01-12 ENCOUNTER — Ambulatory Visit: Payer: Medicare Other | Admitting: Family Medicine

## 2024-01-12 ENCOUNTER — Telehealth: Payer: Self-pay | Admitting: Family Medicine

## 2024-01-12 NOTE — Telephone Encounter (Signed)
 Copied from CRM [id number]. Topic: General - Other >> Jan 12, 2024 10:22 AM [name] wrote: Reason for CRM: Patient stated he was a minute late for his appointment and understands but he cannot wait until June to get a refill of his medications. Requesting a call from the nurse.

## 2024-01-14 ENCOUNTER — Ambulatory Visit: Admitting: Family

## 2024-01-14 VITALS — BP 122/60 | HR 68 | Temp 98.0°F | Ht 65.8 in | Wt 238.0 lb

## 2024-01-14 DIAGNOSIS — I10 Essential (primary) hypertension: Secondary | ICD-10-CM

## 2024-01-14 DIAGNOSIS — M25511 Pain in right shoulder: Secondary | ICD-10-CM

## 2024-01-14 DIAGNOSIS — E782 Mixed hyperlipidemia: Secondary | ICD-10-CM

## 2024-01-14 MED ORDER — PREDNISONE 20 MG PO TABS: 40.0000 mg | ORAL_TABLET | Freq: Every day | ORAL | 0 refills | Status: DC

## 2024-01-14 MED ORDER — OXYCODONE-ACETAMINOPHEN 10-325 MG PO TABS: 1.0000 | ORAL_TABLET | Freq: Three times a day (TID) | ORAL | 0 refills | Status: AC | PRN

## 2024-01-14 NOTE — Progress Notes (Signed)
 Acute Office Visit  Subjective:     Patient ID: George Montgomery, male    DOB: Aug 06, 1956, 68 y.o.   MRN: [id number]  Chief Complaint  Patient presents with   Medication Refill    Patient doesn't need refills right now but here just for future refills. Pt also mentions his right shoulder has been hurting. Pain moves down his arm. He did have surgery 3 years ago and ortho said it was inflammation. Pt said its has gotten worse since then. Hard to work. He is currently taking tylenol for the pain, ice pack and heat, and creams.     HPI Patient is in today with complaints of right shoulder pain that is been ongoing for several months.  He has a history of hypertension, hyperlipidemia, seasonal allergies, and insomnia.  He is under the care of [facility] and had a steroid joint injection 3 weeks ago.  Has an appointment next week to see them.  However, has excruciating pain that rates at a 8 to a 10 out of 10.  Has been taking Tylenol using muscle relaxers, and occasional tramadol that has not been beneficial.  Pain tends to be worse after he is working.  Reports that he is now unable to play his guitar or hug his wife.  Patient also has a history of lumbar radiculopathy that has been stable.  Occasionally have numbness to the front of his thighs that has been going on for about a year off and on.  It is worse when he walks a lot.  Has not treated the issue at this point.  Review of Systems  Constitutional: Negative.   Respiratory: Negative.    Cardiovascular: Negative.   Musculoskeletal:  Positive for joint pain.       Right shoulder pain  Neurological:  Positive for weakness.       Numbness anterior thighs  Psychiatric/Behavioral: Negative.    All other systems reviewed and are negative.  Past Medical History:  Diagnosis Date   Arthritis    Asthma    Back pain    Chronic pain of both shoulders    Depression    Dyspnea    Elevated glucose    Environmental allergies    GERD  (gastroesophageal reflux disease)    HLD (hyperlipidemia)    HTN (hypertension)    Insomnia    Obesity    OSA on CPAP 08/01/2014   Reflux    Trigger finger     Social History   Socioeconomic History   Marital status: Married    Spouse name: Jasmine December   Number of children: 4   Years of education: college   Highest education level: Not on file  Occupational History   Occupation: Advertising account planner    Comment: [employer]  Tobacco Use   Smoking status: Former    Current packs/day: 0.00    Average packs/day: 1 pack/day for 40.0 years (40.0 ttl pk-yrs)    Types: Cigarettes    Start date: 12/03/1972    Quit date: 12/03/2012    Years since quitting: 11.1   Smokeless tobacco: Never  Vaping Use   Vaping status: Never Used  Substance and Sexual Activity   Alcohol use: Yes    Alcohol/week: 2.0 standard drinks of alcohol    Types: 2 Standard drinks or equivalent per week    Comment: occ   Drug use: No   Sexual activity: Yes    Birth control/protection: None  Other Topics Concern  Not on file  Social History Narrative   Right handed, caffeine 3-4 cups daily, Married, 4 kids, 9 g kids., FT insurance agent (self employed). 4 yrs college   Social Drivers of Corporate investment banker Strain: Not on file  Food Insecurity: Not on file  Transportation Needs: Not on file  Physical Activity: Not on file  Stress: Not on file  Social Connections: Not on file  Intimate Partner Violence: Not on file    Past Surgical History:  Procedure Laterality Date   APPENDECTOMY  07/2012   bone spur removal     HEMORRHOID SURGERY  09/2012   HERNIA REPAIR      Family History  Problem Relation Age of Onset   Heart disease Mother    Macular degeneration Mother    Hypertension Mother    Hyperlipidemia Mother    Depression Mother    Anxiety disorder Mother    Liver disease Father    Alcoholism Father    Allergic rhinitis Brother    Asthma Brother    Sinusitis Brother     Bronchitis Brother    Stroke Maternal Aunt    Cancer - Colon Paternal Uncle    Cancer - Prostate Paternal Uncle    Cancer - Colon Other    Cancer - Prostate Other    Immunodeficiency Neg Hx    Urticaria Neg Hx    Eczema Neg Hx    Angioedema Neg Hx     Allergies  Allergen Reactions   Bee Venom Anaphylaxis   Pine Anaphylaxis   Peanut-Containing Drug Products Itching and Other (See Comments)   Estonia Nut (Berthollefia Excelsa)     pruritus   Gabapentin Other (See Comments)   Lisinopril Other (See Comments)    Patient intolerant of drug which caused his tongue to tingle.   Penicillins Itching   Celebrex [Celecoxib]     Current Outpatient Medications on File Prior to Visit  Medication Sig Dispense Refill   albuterol (VENTOLIN HFA) 108 (90 Base) MCG/ACT inhaler Inhale 2 puffs into the lungs every 4 (four) hours as needed for wheezing or shortness of breath (coughing fits). 18 g 1   celecoxib (CELEBREX) 100 MG capsule Take 100 mg by mouth 2 (two) times daily.     cetirizine (ZYRTEC ALLERGY) 10 MG tablet Take 1 tablet (10 mg total) by mouth daily. 30 tablet 0   EPINEPHrine (EPIPEN 2-PAK) 0.3 mg/0.3 mL IJ SOAJ injection Inject 0.3 mg into the muscle as needed for anaphylaxis. 2 each 2   escitalopram (LEXAPRO) 10 MG tablet Take  1 tablet  Daily for Mood                                                              /                                                                   TAKE  BY                                                 MOUTH 90 tablet 3   fenofibrate micronized (LOFIBRA) 134 MG capsule Take 1 capsule Daily for Triglycerides ( Blood Fats)                                                 /                                                                   TAKE                                         BY                                                 MOUTH                     ONCE ? ? ?  DAILY 90 capsule 3   fluticasone (FLONASE) 50 MCG/ACT  nasal spray Place 1 spray into both nostrils 2 (two) times daily as needed (nasal congestion). 16 g 5   fluticasone furoate-vilanterol (BREO ELLIPTA) 200-25 MCG/ACT AEPB Inhale 1 puff into the lungs daily. Rinse mouth after each use. 60 each 5   ipratropium (ATROVENT) 0.06 % nasal spray Use 1 to 2 sprays each nostril 2 to 3 x /day as needed 45 mL 3   meloxicam (MOBIC) 15 MG tablet Take 1/2 to 1 tablet Daily with Food for Pain & Inflammation                                                                   /                                   TAKE                                 BY                                    MOUTH  ONCE  ?  ?  ?  DAILY 90 tablet 3   montelukast (SINGULAIR) 10 MG tablet Take  1 tablet  Daily for Allergies                               /                                                                   TAKE                                         BY                                                 MOUTH ONCE ? ? ?  DAILY 90 tablet 3   omeprazole (PRILOSEC) 20 MG capsule Take  1 capsule  Daily to Prevent Heartburn & Indigestion                                   /                                                                   TAKE                                         BY                                                 MOUTH 90 capsule 3   ondansetron (ZOFRAN) 4 MG tablet Take 1 tablet (4 mg total) by mouth daily as needed for nausea or vomiting. 30 tablet 1   phentermine (ADIPEX-P) 37.5 MG tablet TAKE 1 TABLET EVERY MORNING FOR DIETING & WEIGHT LOSS 30 tablet 0   promethazine-dextromethorphan (PROMETHAZINE-DM) 6.25-15 MG/5ML syrup Take 5 mLs by mouth 4 (four) times daily as needed for cough. 240 mL 1   Semaglutide, 1 MG/DOSE, 4 MG/3ML SOPN Inject 1 mg into the skin once a week. 3 mL 1   sildenafil (VIAGRA) 100 MG tablet Take   1/2 to 1 tablet   Daily  as needed for XXXX 30 tablet 1   SODIUM FLUORIDE 5000 PPM 1.1 % GEL dental gel      tamsulosin (FLOMAX)  0.4 MG CAPS capsule TAKE 1 CAPSULE BY MOUTH AT BEDTIME FOR PROSTATE 90 capsule 0   valsartan-hydrochlorothiazide (DIOVAN-HCT) 80-12.5 MG tablet TAKE 1 TABLET BY MOUTH DAILY FOR BLOOD  PRESSURE 90 tablet 3   Zinc 50 MG TABS Take by mouth.     fexofenadine (ALLEGRA) 180 MG tablet Take by mouth. (Patient not taking: Reported on 01/14/2024)     tiZANidine (ZANAFLEX) 2 MG tablet Take 2 mg by mouth 2 (two) times daily. (Patient not taking: Reported on 01/14/2024)     Current Facility-Administered Medications on File Prior to Visit  Medication Dose Route Frequency Provider Last Rate Last Admin   ipratropium-albuterol (DUONEB) 0.5-2.5 (3) MG/3ML nebulizer solution 3 mL  3 mL Nebulization Once         BP 122/60 (BP Location: Left Arm, Patient Position: Sitting, Cuff Size: Normal)   Pulse 68   Temp 98 F (36.7 C) (Oral)   Ht 5' 5.8" (1.671 m)   Wt 238 lb (108 kg)   SpO2 95%   BMI 38.65 kg/m chart      Objective:    BP 122/60 (BP Location: Left Arm, Patient Position: Sitting, Cuff Size: Normal)   Pulse 68   Temp 98 F (36.7 C) (Oral)   Ht 5' 5.8" (1.671 m)   Wt 238 lb (108 kg)   SpO2 95%   BMI 38.65 kg/m    Physical Exam Vitals and nursing note reviewed.  Constitutional:      Appearance: Normal appearance. He is normal weight.  Cardiovascular:     Rate and Rhythm: Normal rate and regular rhythm.  Pulmonary:     Effort: Pulmonary effort is normal.     Breath sounds: Normal breath sounds.  Abdominal:     General: Abdomen is flat.     Palpations: Abdomen is soft.  Musculoskeletal:        General: Tenderness present. No swelling.     Comments: Right Shoulder: Tenderness to palpation posteriorly.  Muscle tension.  Positive empty can test.  Decreased strength related to pain.  No tenderness to palpation of the spine. ROM without pain   Skin:    General: Skin is warm and dry.  Neurological:     General: No focal deficit present.     Mental Status: He is alert and oriented to  person, place, and time. Mental status is at baseline.  Psychiatric:        Mood and Affect: Mood normal.     No results found for any visits on 01/14/24.      Assessment & Plan:   Problem List Items Addressed This Visit     Hyperlipidemia, mixed   Essential hypertension   Other Visit Diagnoses       Right shoulder pain, unspecified chronicity    -  Primary       Meds ordered this encounter  Medications   predniSONE (DELTASONE) 20 MG tablet    Sig: Take 2 tablets (40 mg total) by mouth daily with breakfast.    Dispense:  10 tablet    Refill:  0   oxyCODONE-acetaminophen (PERCOCET) 10-325 MG tablet    Sig: Take 1 tablet by mouth every 8 (eight) hours as needed for up to 5 days for pain.    Dispense:  15 tablet    Refill:  0   Call the office if symptoms worsen or persist. Recheck as scheduled and sooner as needed. Follow-up with new PCP as scheduled and sooner as needed.  No follow-ups on file.  Eulis Foster, FNP

## 2024-01-18 ENCOUNTER — Telehealth: Payer: Self-pay | Admitting: Plastic Surgery

## 2024-01-18 NOTE — Telephone Encounter (Signed)
 provider out of the office, new sx walk through, moved to a diff day if that does not work, we can move to another day on 01-31-24 if that work

## 2024-01-21 ENCOUNTER — Ambulatory Visit: Admitting: Plastic Surgery

## 2024-01-23 NOTE — Progress Notes (Unsigned)
 Follow Up Note  RE: George Montgomery MRN: [id number] DOB: [birth year] Date of Office Visit: 01/24/2024  Referring provider: Lucky Cowboy, MD Primary care provider: No primary care provider on file.  Chief Complaint: No chief complaint on file.  History of Present Illness: I had the pleasure of seeing George Montgomery for a follow up visit at the [facility] on 01/23/2024. He is a 68 y.o. 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 His previous allergy office visit was on 09/22/2023 with Dr. Selena Batten. Today is a regular follow up visit.  Discussed the use of AI scribe software for clinical note transcription with the patient, who gave verbal consent to proceed.  History of Present Illness            ***  Assessment and Plan: Owyn is a 68 y.o. male with: Hymenoptera allergy Past history - Went to ER few times after stings due to hives and swelling. Never required Epi. 2023 bloodwork positive to honeybee, white faced hornet, yellow jacket, wasp, yellow hornet and bumblebee. Started VIT on 12/30/2022 (MV+HB+W). Interim history -  localized reactions to VIT. Continue to avoid. Continue venom injections - given today. For mild symptoms you can take over the counter antihistamines such as Benadryl and monitor symptoms closely. If symptoms worsen or if you have severe symptoms including breathing issues, throat closure, significant swelling, whole body hives, severe diarrhea and vomiting, lightheadedness then inject epinephrine and seek immediate medical care afterwards. Action plan in place.    Moderate persistent asthma without complication Past history - Gets wheezing, coughing and shortness of breath mainly during URIs. Uses albuterol rarely but during the last URI needed to add on Trelegy for 2 weeks. 2023 CXR unremarkable. 2023 spirometry showed: some restrictive disease (most likely due to body  habitus) with no improvement in FEV1 post bronchodilator treatment. Clinically feeling slightly improved.  Interim history - Reports improved symptoms with daily use of Breo inhaler.  Daily controller medication(s): continue Breo 1 puff once a day and rinse mouth after each use.  May use albuterol rescue inhaler 2 puffs or nebulizer every 4 to 6 hours as needed for shortness of breath, chest tightness, coughing, and wheezing. May use albuterol rescue inhaler 2 puffs 5 to 15 minutes prior to strenuous physical activities. Monitor frequency of use - if you need to use it more than twice per week on a consistent basis let us know.  Get spirometry at next visit - not today due to back pain issues.    Seasonal and perennial allergic rhinitis Past history - Perennial rhinoconjunctivitis symptoms for many years mainly in the spring and fall.  Tried Singulair, Zyrtec and Flonase with some benefit.  No prior ENT evaluation.  2023 CT sinus showed mild mucosal thickening. 2023 skin testing positive to dust mites, mold and cockroach. 2023 bloodwork positive to dust mites, grass, ragweed.  Borderline to cat, cockroach, tree, weed pollen. Interim history - still symptomatic. Interested in starting AIT next year.  Continue environmental control measures. Continue Singulair (montelukast) 10mg  daily at night. Use over the counter antihistamines such as Zyrtec (cetirizine), Claritin (loratadine), Allegra (fexofenadine), or Xyzal (levocetirizine) daily as needed. May take twice a day during allergy flares. May switch antihistamines every few months. Use Flonase (fluticasone) nasal spray 1 spray per nostril twice a day as needed for nasal congestion.  Use azelastine or ipratropium nasal spray 1-2 sprays per nostril twice  a day as needed for runny nose/drainage. Nasal saline spray (i.e., Simply Saline) or nasal saline lavage (i.e., NeilMed) is recommended as needed and prior to medicated nasal sprays. Start  allergy injections - once done with venom build up. Call use and make first environmental allergy shot appointment.    Anaphylactic reaction due to food, subsequent encounter Past history - Upper body itching after eating mixed nuts in December 2023. Has eaten peanut butter since then with no issues. 2023 skin testing showed: Positive to [place] nuts. 2023 bloodwork positive to [place] nuts, borderline to walnuts, pecan and pistachio.  More likely to have anaphylactic reaction to [place] nuts, walnuts. Interim history - tolerated red meat now. Avoiding peanuts as well.  Continue strict avoidance of tree nuts and peanuts. For mild symptoms you can take over the counter antihistamines such as Benadryl and monitor symptoms closely. If symptoms worsen or if you have severe symptoms including breathing issues, throat closure, significant swelling, whole body hives, severe diarrhea and vomiting, lightheadedness then inject epinephrine and seek immediate medical care afterwards. Action plan in place.    Penicillin allergy Consider penicillin allergy skin testing and in office drug challenge in the future.  Over 90% of people with history of penicillin allergy which occurred over 10 years ago are found to be non-allergic.  Assessment and Plan              No follow-ups on file.  No orders of the defined types were placed in this encounter.  Lab Orders  No laboratory test(s) ordered today    Diagnostics: Spirometry:  Tracings reviewed. His effort: {Blank single:19197::"Good reproducible efforts.","It was hard to get consistent efforts and there is a question as to whether this reflects a maximal maneuver.","Poor effort, data can not be interpreted."} FVC: ***L FEV1: ***L, ***% predicted FEV1/FVC ratio: ***% Interpretation: {Blank single:19197::"Spirometry consistent with mild obstructive disease","Spirometry consistent with moderate obstructive disease","Spirometry consistent with severe  obstructive disease","Spirometry consistent with possible restrictive disease","Spirometry consistent with mixed obstructive and restrictive disease","Spirometry uninterpretable due to technique","Spirometry consistent with normal pattern","No overt abnormalities noted given today's efforts"}.  Please see scanned spirometry results for details.  Skin Testing: {Blank single:19197::"Select foods","Environmental allergy panel","Environmental allergy panel and select foods","Food allergy panel","None","Deferred due to recent antihistamines use"}. *** Results discussed with patient/family.   Medication List:  Current Outpatient Medications  Medication Sig Dispense Refill  . albuterol (VENTOLIN HFA) 108 (90 Base) MCG/ACT inhaler Inhale 2 puffs into the lungs every 4 (four) hours as needed for wheezing or shortness of breath (coughing fits). 18 g 1  . celecoxib (CELEBREX) 100 MG capsule Take 100 mg by mouth 2 (two) times daily.    . cetirizine (ZYRTEC ALLERGY) 10 MG tablet Take 1 tablet (10 mg total) by mouth daily. 30 tablet 0  . EPINEPHrine (EPIPEN 2-PAK) 0.3 mg/0.3 mL IJ SOAJ injection Inject 0.3 mg into the muscle as needed for anaphylaxis. 2 each 2  . escitalopram (LEXAPRO) 10 MG tablet Take  1 tablet  Daily for Mood                                                              /  TAKE                                         BY                                                 MOUTH 90 tablet 3  . fenofibrate micronized (LOFIBRA) 134 MG capsule Take 1 capsule Daily for Triglycerides ( Blood Fats)                                                 /                                                                   TAKE                                         BY                                                 MOUTH                     ONCE ? ? ?  DAILY 90 capsule 3  . fexofenadine (ALLEGRA) 180 MG tablet Take by mouth. (Patient not taking:  Reported on 01/14/2024)    . fluticasone (FLONASE) 50 MCG/ACT nasal spray Place 1 spray into both nostrils 2 (two) times daily as needed (nasal congestion). 16 g 5  . fluticasone furoate-vilanterol (BREO ELLIPTA) 200-25 MCG/ACT AEPB Inhale 1 puff into the lungs daily. Rinse mouth after each use. 60 each 5  . ipratropium (ATROVENT) 0.06 % nasal spray Use 1 to 2 sprays each nostril 2 to 3 x /day as needed 45 mL 3  . meloxicam (MOBIC) 15 MG tablet Take 1/2 to 1 tablet Daily with Food for Pain & Inflammation                                                                   /                                   TAKE                                 BY  MOUTH                               ONCE  ?  ?  ?  DAILY 90 tablet 3  . montelukast (SINGULAIR) 10 MG tablet Take  1 tablet  Daily for Allergies                               /                                                                   TAKE                                         BY                                                 MOUTH ONCE ? ? ?  DAILY 90 tablet 3  . omeprazole (PRILOSEC) 20 MG capsule Take  1 capsule  Daily to Prevent Heartburn & Indigestion                                   /                                                                   TAKE                                         BY                                                 MOUTH 90 capsule 3  . ondansetron (ZOFRAN) 4 MG tablet Take 1 tablet (4 mg total) by mouth daily as needed for nausea or vomiting. 30 tablet 1  . phentermine (ADIPEX-P) 37.5 MG tablet TAKE 1 TABLET EVERY MORNING FOR DIETING & WEIGHT LOSS 30 tablet 0  . predniSONE (DELTASONE) 20 MG tablet Take 2 tablets (40 mg total) by mouth daily with breakfast. 10 tablet 0  . promethazine-dextromethorphan (PROMETHAZINE-DM) 6.25-15 MG/5ML syrup Take 5 mLs by mouth 4 (four) times daily as needed for cough. 240 mL 1  . Semaglutide, 1 MG/DOSE, 4 MG/3ML SOPN Inject 1 mg into the skin once a  week. 3 mL 1  . sildenafil (VIAGRA) 100 MG tablet Take   1/2 to 1 tablet   Daily  as needed for XXXX  30 tablet 1  . SODIUM FLUORIDE 5000 PPM 1.1 % GEL dental gel     . tamsulosin (FLOMAX) 0.4 MG CAPS capsule TAKE 1 CAPSULE BY MOUTH AT BEDTIME FOR PROSTATE 90 capsule 0  . tiZANidine (ZANAFLEX) 2 MG tablet Take 2 mg by mouth 2 (two) times daily. (Patient not taking: Reported on 01/14/2024)    . valsartan-hydrochlorothiazide (DIOVAN-HCT) 80-12.5 MG tablet TAKE 1 TABLET BY MOUTH DAILY FOR BLOOD PRESSURE 90 tablet 3  . Zinc 50 MG TABS Take by mouth.     Current Facility-Administered Medications  Medication Dose Route Frequency Provider Last Rate Last Admin  . ipratropium-albuterol (DUONEB) 0.5-2.5 (3) MG/3ML nebulizer solution 3 mL  3 mL Nebulization Once        Allergies: Allergies  Allergen Reactions  . Bee Venom Anaphylaxis  . Pine Anaphylaxis  . Peanut-Containing Drug Products Itching and Other (See Comments)  . [place] Nut (Berthollefia Excelsa)     pruritus  . Gabapentin Other (See Comments)  . Lisinopril Other (See Comments)    Patient intolerant of drug which caused his tongue to tingle.  Marland Kitchen Penicillins Itching  . Celebrex [Celecoxib]    I reviewed his past medical history, social history, family history, and environmental history and no significant changes have been reported from his previous visit.  Review of Systems  Constitutional:  Negative for appetite change, chills, fever and unexpected weight change.  HENT:  Positive for congestion, postnasal drip and rhinorrhea.   Eyes:  Negative for itching.  Respiratory:  Negative for chest tightness, shortness of breath and wheezing.   Cardiovascular:  Negative for chest pain.  Gastrointestinal:  Negative for abdominal pain.  Genitourinary:  Negative for difficulty urinating.  Skin:  Negative for rash.  Allergic/Immunologic: Positive for environmental allergies and food allergies.   Objective: There were no vitals taken for this  visit. There is no height or weight on file to calculate BMI. Physical Exam Vitals and nursing note reviewed.  Constitutional:      Appearance: Normal appearance. He is well-developed. He is obese.  HENT:     Head: Normocephalic and atraumatic.     Right Ear: Tympanic membrane and external ear normal.     Left Ear: Tympanic membrane and external ear normal.     Nose: Nose normal.     Mouth/Throat:     Mouth: Mucous membranes are moist.     Pharynx: Oropharynx is clear.  Eyes:     Conjunctiva/sclera: Conjunctivae normal.  Cardiovascular:     Rate and Rhythm: Normal rate and regular rhythm.     Heart sounds: Normal heart sounds. No murmur heard. Pulmonary:     Effort: Pulmonary effort is normal.     Breath sounds: Normal breath sounds. No wheezing, rhonchi or rales.  Musculoskeletal:     Cervical back: Neck supple.  Skin:    General: Skin is warm.     Findings: No rash.  Neurological:     Mental Status: He is alert and oriented to person, place, and time.  Psychiatric:        Behavior: Behavior normal.  Previous notes and tests were reviewed. The plan was reviewed with the patient/family, and all questions/concerned were addressed.  It was my pleasure to see Jafeth today and participate in his care. Please feel free to contact me with any questions or concerns.  Sincerely,  Wyline Mood, DO Allergy & Immunology  [facility] office: [phone] [facility] office: [phone]

## 2024-01-24 ENCOUNTER — Encounter: Payer: Self-pay | Admitting: Allergy

## 2024-01-24 ENCOUNTER — Ambulatory Visit: Payer: Medicare HMO | Admitting: Allergy

## 2024-01-24 ENCOUNTER — Ambulatory Visit (INDEPENDENT_AMBULATORY_CARE_PROVIDER_SITE_OTHER)

## 2024-01-24 ENCOUNTER — Other Ambulatory Visit: Payer: Self-pay

## 2024-01-24 VITALS — BP 148/72 | HR 72 | Temp 98.2°F | Resp 16

## 2024-01-24 DIAGNOSIS — Z91038 Other insect allergy status: Secondary | ICD-10-CM

## 2024-01-24 DIAGNOSIS — J3089 Other allergic rhinitis: Secondary | ICD-10-CM

## 2024-01-24 DIAGNOSIS — J4540 Moderate persistent asthma, uncomplicated: Secondary | ICD-10-CM

## 2024-01-24 DIAGNOSIS — Z880 Allergy status to penicillin: Secondary | ICD-10-CM

## 2024-01-24 DIAGNOSIS — J302 Other seasonal allergic rhinitis: Secondary | ICD-10-CM

## 2024-01-24 DIAGNOSIS — T7800XD Anaphylactic reaction due to unspecified food, subsequent encounter: Secondary | ICD-10-CM | POA: Diagnosis not present

## 2024-01-24 NOTE — Patient Instructions (Addendum)
 Breathing Daily controller medication(s): continue Breo 1 puff once a day and rinse mouth after each use.  May use albuterol rescue inhaler 2 puffs or nebulizer every 4 to 6 hours as needed for shortness of breath, chest tightness, coughing, and wheezing. May use albuterol rescue inhaler 2 puffs 5 to 15 minutes prior to strenuous physical activities. Monitor frequency of use - if you need to use it more than twice per week on a consistent basis let us know.  Breathing control goals:  Full participation in all desired activities (may need albuterol before activity) Albuterol use two times or less a week on average (not counting use with activity) Cough interfering with sleep two times or less a month Oral steroids no more than once a year No hospitalizations  Food 2023 testing positive to [place] nuts, borderline to walnuts, pecan and pistachio.  More likely to have anaphylactic reaction to [place] nuts, walnuts. Continue strict avoidance of tree nuts and peanuts. For mild symptoms you can take over the counter antihistamines such as Benadryl 1-2 tablets = 25-50mg  and monitor symptoms closely. If symptoms worsen or if you have severe symptoms including breathing issues, throat closure, significant swelling, whole body hives, severe diarrhea and vomiting, lightheadedness then inject epinephrine and seek immediate medical care afterwards. Emergency action plan in place.   Bee stings 2023 bloodwork positive to honeybee, white faced hornet, yellow jacket, wasp, yellow hornet and bumblebee. Continue to avoid. Continue venom injections - given today. For mild symptoms you can take over the counter antihistamines such as Benadryl and monitor symptoms closely. If symptoms worsen or if you have severe symptoms including breathing issues, throat closure, significant swelling, whole body hives, severe diarrhea and vomiting, lightheadedness then inject epinephrine and seek immediate medical care  afterwards. Action plan in place.   Environmental allergies 2023 skin testing positive to dust mites, mold and cockroach. 2023 bloodwork positive to dust mites, grass, ragweed.  Borderline to cat, cockroach, tree, weed pollen. Continue environmental control measures. Continue Singulair (montelukast) 10mg  daily at night. Use over the counter antihistamines such as Zyrtec (cetirizine), Claritin (loratadine), Allegra (fexofenadine), or Xyzal (levocetirizine) daily as needed. May take twice a day during allergy flares. May switch antihistamines every few months. Use Flonase (fluticasone) nasal spray 1-2 sprays per nostril once a day as needed for nasal congestion.  Nasal saline spray (i.e., Simply Saline) or nasal saline lavage (i.e., NeilMed) is recommended as needed and prior to medicated nasal sprays. Use azelastine or ipratropium nasal spray 1-2 sprays per nostril twice a day as needed for runny nose/drainage. Recommend allergy injections. 2 injections.  Had a detailed discussion with patient/family that clinical history is suggestive of allergic rhinitis, and may benefit from allergy immunotherapy (AIT). Discussed in detail regarding the dosing, schedule, side effects (mild to moderate local allergic reaction and rarely systemic allergic reactions including anaphylaxis), and benefits (significant improvement in nasal symptoms, seasonal flares of asthma) of immunotherapy with the patient. There is significant time commitment involved with allergy shots, which includes weekly immunotherapy injections for first 9-12 months and then biweekly to monthly injections for 3-5 years. Consent was signed. Make first shot appointment.   Infections Keep track of infections and antibiotics use.  Penicillin allergy: Consider penicillin allergy skin testing and in office drug challenge in the future.  Over 90% of people with history of penicillin allergy which occurred over 10 years ago are found to be  non-allergic.  You must be off antihistamines for 3-5 days before. Plan on  being in the office for 2-3 hours. You must call to schedule an appointment and specify it's for a drug challenge.  A few days prior to the appointment, I will send in a prescription for amoxicillin liquid which you must bring to the appointment as well.   Follow up in 4 months or sooner if needed.

## 2024-01-24 NOTE — Progress Notes (Signed)
 Aeroallergen Immunotherapy  Ordering Provider: Dr. Wyline Mood  Patient Details Name: George Montgomery MRN: [id number] Date of Birth: 1955-12-31  Order 2 of 2  Vial Label: M-Dm-C-Cr  0.2 ml (Volume)  1:10 Concentration -- Fusarium moniliforme 0.2 ml (Volume)  1:40 Concentration -- Aureobasidium pullulans 0.2 ml (Volume)  1:10 Concentration -- Rhizopus oryzae 0.5 ml (Volume)  1:10 Concentration -- Cat Hair 0.3 ml (Volume)  1:20 Concentration -- Cockroach, German 0.5 ml (Volume)   AU Concentration -- Mite Mix (DF 5,000 & DP 5,000)   1.9  ml Extract Subtotal 3.1  ml Diluent 5.0  ml Maintenance Total  Schedule:  B Blue Vial (1:100,000): Schedule B (6 doses) Yellow Vial (1:10,000): Schedule B (6 doses) Green Vial (1:1,000): Schedule B (6 doses) Red Vial (1:100): Schedule A (14 doses)  Special Instructions: May come in 1-2 times a week during build up as tolerated. Once a week on red vial. Once she is on red vial #1 0.5cc go every 2 weeks, on red vial #2 0.5cc go every 4 weeks. May build up red vials faster (0.1, 0.3, 0.5).

## 2024-01-24 NOTE — Progress Notes (Signed)
 Aeroallergen Immunotherapy  Ordering Provider: Dr. Wyline Mood  Patient Details Name: LORANCE PICKERAL MRN: [id number] Date of Birth: 1956/07/18  Order 1 of 2  Vial Label: G-W-RW-T  0.3 ml (Volume)  BAU Concentration -- 7 Grass Mix* 100,000 ([address] [place], [place], [place], [place], [place], Sweet [place], [place]) 0.3 ml (Volume)  BAU Concentration -- [place] 10,000 0.2 ml (Volume)  1:20 Concentration -- [place] 0.3 ml (Volume)  1:20 Concentration -- Ragweed Mix 0.2 ml (Volume)  1:10 Concentration -- Sheep Sorrell* 0.3 ml (Volume)  1:10 Concentration -- Oak, [place] mix*   1.6  ml Extract Subtotal 3.4  ml Diluent 5.0  ml Maintenance Total  Schedule:  B Blue Vial (1:100,000): Schedule B (6 doses) Yellow Vial (1:10,000): Schedule B (6 doses) Green Vial (1:1,000): Schedule B (6 doses) Red Vial (1:100): Schedule A (14 doses)  Special Instructions: May come in 1-2 times a week during build up as tolerated. Once a week on red vial. Once she is on red vial #1 0.5cc go every 2 weeks, on red vial #2 0.5cc go every 4 weeks. May build up red vials faster (0.1, 0.3, 0.5).

## 2024-01-24 NOTE — Progress Notes (Signed)
 VIAL SET ONE G-W-RW-T MADE 01-24-24. EXP 01-23-25

## 2024-01-25 ENCOUNTER — Encounter: Payer: Self-pay | Admitting: Plastic Surgery

## 2024-01-25 ENCOUNTER — Ambulatory Visit

## 2024-01-25 ENCOUNTER — Ambulatory Visit: Payer: Self-pay | Admitting: Plastic Surgery

## 2024-01-25 VITALS — BP 170/70 | HR 69

## 2024-01-25 DIAGNOSIS — L711 Rhinophyma: Secondary | ICD-10-CM

## 2024-01-25 DIAGNOSIS — L719 Rosacea, unspecified: Secondary | ICD-10-CM

## 2024-01-25 DIAGNOSIS — J301 Allergic rhinitis due to pollen: Secondary | ICD-10-CM | POA: Diagnosis not present

## 2024-01-25 NOTE — Progress Notes (Signed)
 VIAL SET TWO M-DM-C-CR MADE 01-25-24. EXP 01-24-25

## 2024-01-25 NOTE — Progress Notes (Signed)
 The patient is a 68 year old male here for follow-up on his nose.  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  He has some prominent vessels that I believe we can improve on with laser.  I am in to set him up with [facility] for that.  He also has a changing skin lesion on the left cheek near the lower lid medially.  We are going to go ahead and excise that as well.  I will plan to see him back for his nose in 3 months.  Pictures were obtained of the patient and placed in the chart with the patient's or guardian's permission.

## 2024-01-26 DIAGNOSIS — M533 Sacrococcygeal disorders, not elsewhere classified: Secondary | ICD-10-CM | POA: Insufficient documentation

## 2024-01-26 DIAGNOSIS — J3089 Other allergic rhinitis: Secondary | ICD-10-CM | POA: Diagnosis not present

## 2024-02-09 ENCOUNTER — Ambulatory Visit (INDEPENDENT_AMBULATORY_CARE_PROVIDER_SITE_OTHER): Payer: Self-pay | Admitting: Surgical

## 2024-02-09 DIAGNOSIS — L711 Rhinophyma: Secondary | ICD-10-CM

## 2024-02-09 DIAGNOSIS — L719 Rosacea, unspecified: Secondary | ICD-10-CM

## 2024-02-09 NOTE — Progress Notes (Signed)
 Preoperative Dx: Facial vessels  Postoperative Dx:  same  Procedure: laser to nose  Anesthesia: none  Description of Procedure:  Risks and complications were explained to the patient. Consent was confirmed and signed. Time out was called and all information was confirmed to be correct. The area  area was prepped with alcohol and wiped dry.  The patient tolerated the procedure well and there were no complications.

## 2024-02-14 ENCOUNTER — Ambulatory Visit (INDEPENDENT_AMBULATORY_CARE_PROVIDER_SITE_OTHER)

## 2024-02-14 DIAGNOSIS — J309 Allergic rhinitis, unspecified: Secondary | ICD-10-CM | POA: Diagnosis not present

## 2024-02-21 ENCOUNTER — Other Ambulatory Visit: Payer: Self-pay | Admitting: Family Medicine

## 2024-02-21 ENCOUNTER — Ambulatory Visit

## 2024-02-21 DIAGNOSIS — E66812 Obesity, class 2: Secondary | ICD-10-CM

## 2024-02-21 DIAGNOSIS — N401 Enlarged prostate with lower urinary tract symptoms: Secondary | ICD-10-CM

## 2024-02-21 DIAGNOSIS — F329 Major depressive disorder, single episode, unspecified: Secondary | ICD-10-CM

## 2024-02-21 MED ORDER — ESCITALOPRAM OXALATE 10 MG PO TABS: ORAL_TABLET | ORAL | 3 refills | Status: DC

## 2024-02-21 MED ORDER — TAMSULOSIN HCL 0.4 MG PO CAPS: ORAL_CAPSULE | ORAL | 0 refills | Status: DC

## 2024-02-21 MED ORDER — PHENTERMINE HCL 37.5 MG PO TABS: ORAL_TABLET | ORAL | 0 refills | Status: DC

## 2024-02-21 NOTE — Telephone Encounter (Signed)
 Copied from CRM [id number]. Topic: Clinical - Medication Refill >> Feb 21, 2024  1:15 PM Star [place] wrote: Most Recent Primary Care Visit:  Provider: Jarold Merlin B  Department: [facility]  Visit Type: ACUTE  Date: 01/14/2024  Medication: tamsulosin  (FLOMAX ) 0.4 MG CAPS capsule escitalopram  (LEXAPRO ) 10 MG tablet  phentermine  (ADIPEX-P ) 37.5 MG tablet  Has the patient contacted their pharmacy? Yes (Agent: If no, request that the patient contact the pharmacy for the refill. If patient does not wish to contact the pharmacy document the reason why and proceed with request.) (Agent: If yes, when and what did the pharmacy advise?)  Is this the correct pharmacy for this prescription? Yes If no, delete pharmacy and type the correct one.  This is the patient's preferred pharmacy:  [facility] [phone] [place], [place] - [address] [place] [address] Phone: [phone] Fax: [phone]     Has the prescription been filled recently? Yes  Is the patient out of the medication? Yes  Has the patient been seen for an appointment in the last year OR does the patient have an upcoming appointment? Yes  Can we respond through MyChart? Yes  Agent: Please be advised that Rx refills may take up to 3 business days. We ask that you follow-up with your pharmacy.

## 2024-02-22 ENCOUNTER — Ambulatory Visit (INDEPENDENT_AMBULATORY_CARE_PROVIDER_SITE_OTHER)

## 2024-02-22 DIAGNOSIS — Z91038 Other insect allergy status: Secondary | ICD-10-CM

## 2024-02-24 ENCOUNTER — Ambulatory Visit (INDEPENDENT_AMBULATORY_CARE_PROVIDER_SITE_OTHER): Payer: Self-pay

## 2024-02-24 DIAGNOSIS — J309 Allergic rhinitis, unspecified: Secondary | ICD-10-CM | POA: Diagnosis not present

## 2024-03-07 ENCOUNTER — Telehealth: Payer: Self-pay | Admitting: *Deleted

## 2024-03-07 NOTE — Telephone Encounter (Signed)
 I received fax from [facility] pt needing supplies.  Order is needed.  Pt last seen 09-2021. Will need appt.  I called and LMVM for pr regarding this.  Amy Lomax NP saw pt last.  If no other issues only cpap supplies then can see Amy, NP.

## 2024-03-08 NOTE — Telephone Encounter (Signed)
 Pt called back and made appt for him for tomorrow at 0845 with Dr Omar Bibber.  DL printed.

## 2024-03-08 NOTE — Telephone Encounter (Signed)
 Pt has returned call to [place], [place]

## 2024-03-09 ENCOUNTER — Encounter: Payer: Self-pay | Admitting: Neurology

## 2024-03-09 ENCOUNTER — Ambulatory Visit: Admitting: Neurology

## 2024-03-09 VITALS — BP 132/60 | HR 64 | Ht 69.0 in | Wt 238.6 lb

## 2024-03-09 DIAGNOSIS — G4733 Obstructive sleep apnea (adult) (pediatric): Secondary | ICD-10-CM

## 2024-03-09 DIAGNOSIS — G479 Sleep disorder, unspecified: Secondary | ICD-10-CM | POA: Diagnosis not present

## 2024-03-09 NOTE — Progress Notes (Signed)
 Subjective:    Patient ID: George Montgomery is a 68 y.o. male.  HPI    Interim history:   George Montgomery is a 68 year old right-handed gentleman with an underlying medical  history of cervical and lumbar degenerative spine disease, anxiety, depression,  asthma, hypertension, obesity, and OSA, who presents for follow-up consultation of his OSA, on BiPAP therapy. The patient is accompanied by his wife today.  He has not been seen in our sleep clinic in nearly 2 and half years.  He was last seen in December 2022 and a video visit by Terrilyn Fick, NP, at which time he was compliant with his BiPAP ST with good apnea control.  Today, 03/09/2024: I reviewed his BiPAP compliance data from 02/07/2024 through 03/07/2024, which is a total of 30 days, during which time he used his machine every night with percent use days greater than 4 hours at 100%, indicating superb compliance with an average usage of 9 hours and 14 minutes, residual AHI at goal at 0.8/h, pressure of 17/13 centimeters with a backup rate of 4.  Leak on the higher side with the 95th percentile at 39.2 L/min.  He reports not sleeping well.  Fortunately, he has had chronic difficulty sleeping.  He has tried prescription medicine such as trazodone before which did not help and Ambien caused him to have shortness of breath.  He has not tried melatonin.  He does not always drink well, does complain of sinus, has to take allergy  medicines allergy  shots.  He drinks about 16 to 32 ounces of water per day, quite a bit of caffeine in the form of coffee, 4 to 5 cups at least and after 5 PM he goes to decaf.  He does not drink soda daily.  He drinks alcohol about 2 x per week.  He is up-to-date with his PAP supplies.  He alternates between nasal mask and nasal pillows.  He would be willing to try a fullface mask.   His set up date was 03/05/2020.  The patient's allergies, current medications, family history, past medical history, past social history, past surgical  history and problem list were reviewed and updated as appropriate.    Previously (copied from previous notes for reference):     09/25/2021 (Amy Lomax, NP): <<George Montgomery is a 68 y.o. male here today for follow up for OSA on BiPAP. He continues to do well on therapy. He is using a new nasal pillow and reports leak has been much better at home. He is sleeping much better. Not snoring. He is feeling well and without concerns, today.>>  05/07/2020 Clem Currier, NP): <<George Montgomery is a 68 year old male with a history of obstructive sleep apnea on biPAP.  His download indicates that he uses machine nightly for compliance of 100%.  He uses machine greater than 4 hours each night.  On average he uses his machine 8 hours and 55 minutes.  His residual AHI is 1.1 on 17/13 centimeters of water with 4 cm of water pressure support.  His leak in the 95th percentile is 28.3 L/min.  Reports that he is still getting used to the machine.  Overall he does feel that it does offer him some benefit.>>   01/22/2020 (SA): 68 year old right-handed gentleman with an underlying medical  history of cervical and lumbar degenerative spine disease, anxiety, depression (not currently on medication d/t side), asthma, hypertension, obesity, and OSA, who presents for follow-up consultation of his OSA, for his yearly checkup. The patient is unaccompanied  today. I last saw him on 11/08/2018, at which time he was fully compliant with his BiPAP machine.  He wanted to pursue getting a new machine through Medicare when he turns 64.  He had significant joint pain and had trouble losing weight.  I made a referral to weight management clinic.  He also had additional stressors what with his elderly mother's medical issues and his wife's joint replacement surgery pending.   Today, 01/22/2020: I reviewed his BiPAP compliance data from 11/19/2019 through 12/18/2019, which is a total of 30 days, during which time he used his machine every night with  percent use days greater than 4 hours at 100%, indicating superb compliance, average usage of 9 hours and 5 minutes, residual AHI at goal at 1.8/h, pressure of 17/13, leak acceptable.  Data from after December 18, 2019 is not available.  He reports that he would like to have a new machine.  For some reason, we could not get the data from after 12/18/2019.  He is compliant with treatment.  He uses nasal pillows and has been getting his supplies online.  He has not had a sleep study in many years.  He is working on weight loss.  He would be willing to proceed with a home sleep test to reevaluate his sleep apnea diagnosis and getting new equipment, he is eligible for new machine.  He has received his first Covid vaccine dose, is due this week for his second dose, did well after the first 1.       I saw him on 09/22/2017, at which time he was fully compliant with his BiPAP and reported some sleep disruption from his joint pain and nocturia.   I reviewed his BiPAP compliance data from 10/09/2018 through 11/07/2018 which is a total of 30 days, during which time he used his BiPAP every night with percent used days greater than 4 hours at 100%, indicating superb compliance. Average usage of 9 hours and 21 minutes, residual AHI at goal at 1.4 per hour, leak in the acceptable range, pressure at 17/13 cm.      I saw him on 07/20/2016, at which time he reported doing quite well, no further headaches, had some joint pain, requested to switch in his DME provider to [facility]. He was compliant with his BiPAP of 17/13 cm.   I reviewed his BiPAP compliance data from 08/23/2017 through 09/21/2014 which is a total of 30 days, during which time he used his machine every night with percent used days greater than 4 hours at 100%, indicating superb compliance with an average usage of 8 hours and 22 minutes, residual AHI at goal at 1.9 per hour, leak acceptable, pressure of 17/13 cm.    I saw him on 07/18/2015, at which time he reported  feeling better. Headaches had improved. He was compliant with BiPAP. He was on verapamil . Unfortunately, he had taken a fall on his boat and landed on both knees, was suffering from right knee pain since then and was supposed to see her orthopedic doctor. He was taking trazodone as needed, 150 mg strength, a quarter of a pill as needed.   I saw him on 08/27/2014 and he canceled an appointment for December 2015. He was advised to follow-up in one month at the time. I suggested sumatriptan  100 mg strength for abortive treatment of cluster headaches at the time and starting him on verapamil  for headache prevention. I asked him to be compliant with BiPAP therapy. I asked him to  undergo a brain MRI. He did not pursue this as his headaches improved after he started the verapamil  and he started feeling better.   I reviewed his BiPAP compliance data from 06/18/2015 through 07/17/2015 which is a total of 30 days during which time he was 100% compliant. Average usage was 8 hours and 57 minutes. He estimates that he sleeps about 6-7 hours on an average night. Sometimes he has to get up to use the bathroom. Average AHI was 1.5 per hour, pressure of 17/13, leak low.   The patient reports new onset HA since 08/02/14, started abruptly. No triggers noted. He recently had the flu shot. He also stopped his testosterone  replacement about a month ago. He does endorse more stress, work related. He has also recently increased his caffeine intake. He has had a daily headache which lasts up to 2 hours. It typically starts in the evening. It is right-sided behind his eye and retro-orbital also in the temporal area and radiates to the back. It is severe. He does get puffiness around the right eye and occasional runny nose on the right side. He does not have any double vision or blurry vision. Glasses have not been checked in over 18 months. He does not have nausea but does have to lie down. Hydrocodone  has not helped it only 1 pill and  he has to take 2. He has also taken high-dose Motrin , 600 mg, and has tried a muscle relaxant, he has to lie down. He has never had anything like this before. He does not have a history of migraines before. He does not note any neurological symptoms and in between he feels at baseline. He has been reluctant to do anything besides the minimal for fear of exacerbating these headaches. They have been daily since 08/02/2014. He was prescribed Frovatriptan on 08/21/14 by Dr. Cresenciano Doing, but his insurance did not cover that. He has tried sumatriptan  and 50 mg which he felt has helped after about 15-20 minutes.he has found relief with taking a warm bath. He has also tried heat pad on his head, which provided some relief.   The patient recalls being diagnosed with OSA some 12 years ago, severe, per patient. Over the years, he has had about 3 sleep studies, last some 3 years ago. He was originally placed on CPAP and most recently at the last sleep study he remembers that he was switched to BiPAP. He brought his BiPAP machine in for review and we were able to get a download: This was from 05/01/2014 through 07/31/2014 during which time he used his BiPAP machine every night without any skipped days and his percent used days greater than 4 hours was 100% indicating superb compliance, average usage was 8 hours and 7 minutes approximately. Residual AHI was low at 0.7 per hour and leak was very low at 1 minute and 58 seconds as far as large leak per day dose. His pressure is 17/13 cm.     His typical bedtime is between 10:30 and 11 PM and usually falls asleep quickly. Weight time is between 6 and 6:30 AM. He estimates that he gets about 7 hours of sleep. He denies any morning headaches or severe daytime somnolence. He does have an ESS of 11/24 today. He denies significant restless leg symptoms. His first sleep study may have been about 12 years ago. He gained quite a bit of weight after he quit smoking about 2 years ago. He gets  his DME care through [facility] medical supplies.  His Past Medical History Is Significant For: Past Medical History:  Diagnosis Date   Arthritis    Asthma    Back pain    Chronic pain of both shoulders    Depression    Dyspnea    Elevated glucose    Environmental allergies    GERD (gastroesophageal reflux disease)    HLD (hyperlipidemia)    HTN (hypertension)    Insomnia    Obesity    OSA on CPAP 08/01/2014   Reflux    Trigger finger     His Past Surgical History Is Significant For: Past Surgical History:  Procedure Laterality Date   APPENDECTOMY  07/2012   bone spur removal     HEMORRHOID SURGERY  09/2012   HERNIA REPAIR      His Family History Is Significant For: Family History  Problem Relation Age of Onset   Heart disease Mother    Macular degeneration Mother    Hypertension Mother    Hyperlipidemia Mother    Depression Mother    Anxiety disorder Mother    Liver disease Father    Alcoholism Father    Snoring Father    Allergic rhinitis Brother    Asthma Brother    Sinusitis Brother    Bronchitis Brother    Sleep apnea Brother    Stroke Maternal Aunt    Snoring Maternal Uncle    Cancer - Colon Paternal Uncle    Cancer - Prostate Paternal Uncle    Cancer - Colon Other    Cancer - Prostate Other    Immunodeficiency Neg Hx    Urticaria Neg Hx    Eczema Neg Hx    Angioedema Neg Hx     His Social History Is Significant For: Social History   Socioeconomic History   Marital status: Married    Spouse name: Genevia Kern   Number of children: 4   Years of education: college   Highest education level: Not on file  Occupational History   Occupation: Advertising account planner    Comment: [employer]  Tobacco Use   Smoking status: Former    Current packs/day: 0.00    Average packs/day: 1 pack/day for 40.0 years (40.0 ttl pk-yrs)    Types: Cigarettes    Start date: 12/03/1972    Quit date: 12/03/2012    Years since quitting: 11.2   Smokeless  tobacco: Never  Vaping Use   Vaping status: Never Used  Substance and Sexual Activity   Alcohol use: Yes    Alcohol/week: 2.0 standard drinks of alcohol    Types: 2 Standard drinks or equivalent per week    Comment: occ   Drug use: No   Sexual activity: Yes    Birth control/protection: None  Other Topics Concern   Not on file  Social History Narrative   Right handed, caffeine 3-4 cups daily, Married, 4 kids, 9 g kids., FT insurance agent (self employed). 4 yrs college   Pt lives with wife    Social Drivers of Corporate investment banker Strain: Not on file  Food Insecurity: Not on file  Transportation Needs: Not on file  Physical Activity: Not on file  Stress: Not on file  Social Connections: Not on file    His Allergies Are:  Allergies  Allergen Reactions   Bee Venom Anaphylaxis   Pine Anaphylaxis   Peanut-Containing Drug Products Itching and Other (See Comments)   Estonia Nut (Berthollefia Excelsa)     pruritus   Gabapentin  Other (  See Comments)   Lisinopril  Other (See Comments)    Patient intolerant of drug which caused his tongue to tingle.   Penicillins Itching   Celebrex  [Celecoxib ]   :   His Current Medications Are:  Outpatient Encounter Medications as of 03/09/2024  Medication Sig   albuterol  (VENTOLIN  HFA) 108 (90 Base) MCG/ACT inhaler Inhale 2 puffs into the lungs every 4 (four) hours as needed for wheezing or shortness of breath (coughing fits).   celecoxib  (CELEBREX ) 100 MG capsule Take 100 mg by mouth 2 (two) times daily.   cetirizine  (ZYRTEC  ALLERGY ) 10 MG tablet Take 1 tablet (10 mg total) by mouth daily.   EPINEPHrine  (EPIPEN  2-PAK) 0.3 mg/0.3 mL IJ SOAJ injection Inject 0.3 mg into the muscle as needed for anaphylaxis.   escitalopram  (LEXAPRO ) 10 MG tablet Take  1 tablet  Daily for Mood                                                              /                                                                   TAKE                                          BY                                                 MOUTH   fenofibrate  micronized (LOFIBRA) 134 MG capsule Take 1 capsule Daily for Triglycerides ( Blood Fats)                                                 /                                                                   TAKE                                         BY                                                 MOUTH  ONCE ? ? ?  DAILY   fluticasone  (FLONASE ) 50 MCG/ACT nasal spray Place 1 spray into both nostrils 2 (two) times daily as needed (nasal congestion). (Patient taking differently: Place 1 spray into both nostrils as needed (nasal congestion).)   fluticasone  furoate-vilanterol (BREO ELLIPTA ) 200-25 MCG/ACT AEPB Inhale 1 puff into the lungs daily. Rinse mouth after each use.   ipratropium (ATROVENT ) 0.06 % nasal spray Use 1 to 2 sprays each nostril 2 to 3 x /day as needed   montelukast  (SINGULAIR ) 10 MG tablet Take  1 tablet  Daily for Allergies                               /                                                                   TAKE                                         BY                                                 MOUTH ONCE ? ? ?  DAILY   omeprazole  (PRILOSEC) 20 MG capsule Take  1 capsule  Daily to Prevent Heartburn & Indigestion                                   /                                                                   TAKE                                         BY                                                 MOUTH   ondansetron  (ZOFRAN ) 4 MG tablet Take 1 tablet (4 mg total) by mouth daily as needed for nausea or vomiting.   phentermine  (ADIPEX-P ) 37.5 MG tablet TAKE 1 TABLET EVERY MORNING FOR DIETING & WEIGHT LOSS   promethazine -dextromethorphan (PROMETHAZINE -DM) 6.25-15 MG/5ML syrup Take 5 mLs by mouth 4 (four) times daily as needed for cough. (Patient taking differently: Take 5 mLs by mouth as needed for cough.)   Semaglutide , 1 MG/DOSE, 4 MG/3ML SOPN Inject 1 mg into the  skin once a week.   sildenafil  (VIAGRA ) 100 MG tablet Take  1/2 to 1 tablet   Daily  as needed for XXXX   SODIUM FLUORIDE 5000 PPM 1.1 % GEL dental gel    tamsulosin  (FLOMAX ) 0.4 MG CAPS capsule TAKE 1 CAPSULE BY MOUTH AT BEDTIME FOR PROSTATE   valsartan -hydrochlorothiazide  (DIOVAN -HCT) 80-12.5 MG tablet TAKE 1 TABLET BY MOUTH DAILY FOR BLOOD PRESSURE   Zinc 50 MG TABS Take by mouth.   meloxicam  (MOBIC ) 15 MG tablet Take 1/2 to 1 tablet Daily with Food for Pain & Inflammation                                                                   /                                   TAKE                                 BY                                    MOUTH                               ONCE  ?  ?  ?  DAILY   methylPREDNISolone  (MEDROL  DOSEPAK) 4 MG TBPK tablet Take 4 mg by mouth.   predniSONE  (DELTASONE ) 20 MG tablet Take 2 tablets by mouth daily with breakfast.   [DISCONTINUED] predniSONE  (DELTASONE ) 20 MG tablet Take 2 tablets (40 mg total) by mouth daily with breakfast.   Facility-Administered Encounter Medications as of 03/09/2024  Medication   ipratropium-albuterol  (DUONEB) 0.5-2.5 (3) MG/3ML nebulizer solution 3 mL  :  Review of Systems:  Out of a complete 14 point review of systems, all are reviewed and negative with the exception of these symptoms as listed below:  Review of Systems  Neurological:        Pt here for cpap f/u Pt states not sleeping well at night Pt states sleeping 2-3 hours nightly Pt states increased fatigue and taking naps during the day    ESS:8     Objective:  Neurological Exam  Physical Exam Physical Examination:   Vitals:   03/09/24 0833  BP: 132/60  Pulse: 64    General Examination: The patient is a very pleasant 68 y.o. male in no acute distress. He appears well-developed and well-nourished and well groomed.   HEENT: Normocephalic, atraumatic, pupils are equal, round and reactive to light. Corrective eye glasses in place. Normal smooth pursuit  is noted. Hearing is grossly intact. Face is symmetric with normal facial animation and normal facial sensation. Speech is clear with no dysarthria noted. There is no hypophonia. There is no lip, neck/head, jaw or voice tremor. Neck shows FROM. Oropharynx exam reveals: Moderate mouth dryness, adequate dental hygiene and moderate airway crowding. Mallampati is class II. Tongue protrudes centrally and palate elevates symmetrically. Tonsils are absent.    Chest: Clear to auscultation without wheezing, rhonchi or crackles noted.   Heart: S1+S2+0, regular and normal  without murmurs, rubs or gallops noted.    Abdomen: Soft, non-tender and non-distended.   Extremities: There is no obvious swelling in the distal lower extremities bilaterally.   Skin: Warm and dry without trophic changes noted.    Musculoskeletal: exam reveals no obv. Change.    Neurologically:  Mental status: The patient is awake, alert and oriented in all 4 spheres. His immediate and remote memory, attention, language skills and fund of knowledge are appropriate. There is no evidence of aphasia, agnosia, apraxia or anomia. Speech is clear with normal prosody and enunciation. Thought process is linear. Mood is normal and affect is normal.  Cranial nerves II - XII are as described above under HEENT exam. Motor exam: Normal bulk, moving all 4 extremities without restriction, no obvious action or resting tremor.  Fine motor skills and coordination: intact grossly.   Cerebellar testing: No dysmetria or intention tremor. There is no truncal or gait ataxia.   Gait, station and balance: He stands easily. No veering to one side is noted. No leaning to one side is noted. Posture is age-appropriate and stance is narrow based. Gait shows normal stride length and normal pace. No problems turning are noted.    Assessment and Plan:    In summary, George Montgomery is a 68 year old right-handed gentleman with an underlying medical  history of  cervical and lumbar degenerative spine disease, anxiety, depression,  asthma, hypertension, obesity, and OSA, who presents for follow-up consultation of his OSA, on BiPAP therapy.  He continues to be compliant with treatment.  He had a home sleep test in 2021 which showed severe sleep apnea.  We maintained him on BiPAP therapy and settings look good, leak from the mask is higher and he is encouraged to try a fullface mask.  I will write for new supplies and a chinstrap as well.  He is not sleeping well but we talked about the importance of maintaining good sleep hygiene and limiting caffeine and increasing water intake today.  He is encouraged to try melatonin.  He is also advised to talk to his primary care about potentially increasing his antidepressant as he does endorse residual anxiety and depressive symptoms.  He is advised to follow-up routinely in the sleep clinic in about a year to see one of our nurse practitioners.  I answered all their questions today and the patient and his wife were in agreement. I spent 30 minutes in total face-to-face time and in reviewing records during pre-charting, more than 50% of which was spent in counseling and coordination of care, reviewing test results, reviewing medications and treatment regimen and/or in discussing or reviewing the diagnosis of OSA, the prognosis and treatment options. Pertinent laboratory and imaging test results that were available during this visit with the patient were reviewed by me and considered in my medical decision making (see chart for details).

## 2024-03-09 NOTE — Patient Instructions (Signed)
 Please reduce your caffeine to 1-2 servings per day, avoid caffeine after 3 PM.  Please increase your water intake to about 64 ounces per day.  Try melatonin, about 3 to 5 mg 1 to 2 hours before bedtime.  Talk to your primary care about your residual anxiety and depressive symptoms.

## 2024-03-10 ENCOUNTER — Ambulatory Visit (INDEPENDENT_AMBULATORY_CARE_PROVIDER_SITE_OTHER)

## 2024-03-10 DIAGNOSIS — J309 Allergic rhinitis, unspecified: Secondary | ICD-10-CM

## 2024-03-17 ENCOUNTER — Ambulatory Visit (INDEPENDENT_AMBULATORY_CARE_PROVIDER_SITE_OTHER): Payer: Self-pay

## 2024-03-17 DIAGNOSIS — J309 Allergic rhinitis, unspecified: Secondary | ICD-10-CM

## 2024-03-21 ENCOUNTER — Ambulatory Visit (INDEPENDENT_AMBULATORY_CARE_PROVIDER_SITE_OTHER)

## 2024-03-21 DIAGNOSIS — Z91038 Other insect allergy status: Secondary | ICD-10-CM

## 2024-03-23 ENCOUNTER — Ambulatory Visit: Payer: Medicare Other | Admitting: Internal Medicine

## 2024-03-24 ENCOUNTER — Ambulatory Visit (INDEPENDENT_AMBULATORY_CARE_PROVIDER_SITE_OTHER): Payer: Self-pay

## 2024-03-24 DIAGNOSIS — J309 Allergic rhinitis, unspecified: Secondary | ICD-10-CM | POA: Diagnosis not present

## 2024-03-31 ENCOUNTER — Ambulatory Visit (INDEPENDENT_AMBULATORY_CARE_PROVIDER_SITE_OTHER)

## 2024-03-31 DIAGNOSIS — J309 Allergic rhinitis, unspecified: Secondary | ICD-10-CM

## 2024-04-06 ENCOUNTER — Ambulatory Visit: Admitting: Family Medicine

## 2024-04-06 ENCOUNTER — Encounter: Payer: Self-pay | Admitting: Family Medicine

## 2024-04-06 VITALS — BP 142/78 | HR 68 | Temp 98.3°F | Ht 69.0 in | Wt 236.8 lb

## 2024-04-06 DIAGNOSIS — E559 Vitamin D deficiency, unspecified: Secondary | ICD-10-CM

## 2024-04-06 DIAGNOSIS — E1169 Type 2 diabetes mellitus with other specified complication: Secondary | ICD-10-CM | POA: Diagnosis not present

## 2024-04-06 DIAGNOSIS — N401 Enlarged prostate with lower urinary tract symptoms: Secondary | ICD-10-CM

## 2024-04-06 DIAGNOSIS — E785 Hyperlipidemia, unspecified: Secondary | ICD-10-CM

## 2024-04-06 DIAGNOSIS — G4733 Obstructive sleep apnea (adult) (pediatric): Secondary | ICD-10-CM

## 2024-04-06 DIAGNOSIS — F419 Anxiety disorder, unspecified: Secondary | ICD-10-CM

## 2024-04-06 DIAGNOSIS — N182 Chronic kidney disease, stage 2 (mild): Secondary | ICD-10-CM

## 2024-04-06 DIAGNOSIS — Z7985 Long-term (current) use of injectable non-insulin antidiabetic drugs: Secondary | ICD-10-CM

## 2024-04-06 DIAGNOSIS — F32A Depression, unspecified: Secondary | ICD-10-CM

## 2024-04-06 DIAGNOSIS — I10 Essential (primary) hypertension: Secondary | ICD-10-CM | POA: Diagnosis not present

## 2024-04-06 DIAGNOSIS — E1122 Type 2 diabetes mellitus with diabetic chronic kidney disease: Secondary | ICD-10-CM

## 2024-04-06 DIAGNOSIS — N138 Other obstructive and reflux uropathy: Secondary | ICD-10-CM

## 2024-04-06 LAB — COMPREHENSIVE METABOLIC PANEL WITH GFR
ALT: 37 U/L (ref 0–53)
AST: 21 U/L (ref 0–37)
Albumin: 4.5 g/dL (ref 3.5–5.2)
Alkaline Phosphatase: 67 U/L (ref 39–117)
BUN: 21 mg/dL (ref 6–23)
CO2: 29 meq/L (ref 19–32)
Calcium: 9.6 mg/dL (ref 8.4–10.5)
Chloride: 102 meq/L (ref 96–112)
Creatinine, Ser: 0.88 mg/dL (ref 0.40–1.50)
GFR: 87.92 mL/min (ref >60.00)
Glucose, Bld: 106 mg/dL — ABNORMAL HIGH (ref 70–99)
Potassium: 3.7 meq/L (ref 3.5–5.1)
Sodium: 138 meq/L (ref 135–145)
Total Bilirubin: 0.6 mg/dL (ref 0.2–1.2)
Total Protein: 7.4 g/dL (ref 6.0–8.3)

## 2024-04-06 LAB — TSH: TSH: 1.12 u[IU]/mL (ref 0.35–5.50)

## 2024-04-06 LAB — LIPID PANEL
Cholesterol: 150 mg/dL (ref 0–200)
HDL: 33.9 mg/dL — ABNORMAL LOW (ref >39.00)
LDL Cholesterol: 73 mg/dL (ref 0–99)
NonHDL: 115.65
Total CHOL/HDL Ratio: 4
Triglycerides: 213 mg/dL — ABNORMAL HIGH (ref 0.0–149.0)
VLDL: 42.6 mg/dL — ABNORMAL HIGH (ref 0.0–40.0)

## 2024-04-06 LAB — VITAMIN D 25 HYDROXY (VIT D DEFICIENCY, FRACTURES): VITD: 30.19 ng/mL (ref 30.00–100.00)

## 2024-04-06 LAB — CBC WITH DIFFERENTIAL/PLATELET
Basophils Absolute: 0 10*3/uL (ref 0.0–0.1)
Basophils Relative: 0.4 % (ref 0.0–3.0)
Eosinophils Absolute: 0.2 10*3/uL (ref 0.0–0.7)
Eosinophils Relative: 2 % (ref 0.0–5.0)
HCT: 43.6 % (ref 39.0–52.0)
Hemoglobin: 14.9 g/dL (ref 13.0–17.0)
Lymphocytes Relative: 28.5 % (ref 12.0–46.0)
Lymphs Abs: 2.2 10*3/uL (ref 0.7–4.0)
MCHC: 34.2 g/dL (ref 30.0–36.0)
MCV: 93.8 fl (ref 78.0–100.0)
Monocytes Absolute: 0.6 10*3/uL (ref 0.1–1.0)
Monocytes Relative: 7.3 % (ref 3.0–12.0)
Neutro Abs: 4.9 10*3/uL (ref 1.4–7.7)
Neutrophils Relative %: 61.8 % (ref 43.0–77.0)
Platelets: 211 10*3/uL (ref 150.0–400.0)
RBC: 4.65 Mil/uL (ref 4.22–5.81)
RDW: 13.3 % (ref 11.5–15.5)
WBC: 7.8 10*3/uL (ref 4.0–10.5)

## 2024-04-06 LAB — HEMOGLOBIN A1C: Hgb A1c MFr Bld: 6.3 % (ref 4.6–6.5)

## 2024-04-06 MED ORDER — FENOFIBRATE MICRONIZED 134 MG PO CAPS: ORAL_CAPSULE | ORAL | 3 refills | Status: DC

## 2024-04-06 MED ORDER — VALSARTAN-HYDROCHLOROTHIAZIDE 80-12.5 MG PO TABS: 1.0000 | ORAL_TABLET | Freq: Every day | ORAL | 3 refills | Status: DC

## 2024-04-06 MED ORDER — OMEPRAZOLE 20 MG PO CPDR: DELAYED_RELEASE_CAPSULE | ORAL | 3 refills | Status: DC

## 2024-04-06 NOTE — Patient Instructions (Signed)
 Please go downstairs for labs before you leave.  Reach out to your allergist regarding medications for your allergies and asthma.  Let me know if you have any issues getting your medications refilled on time.  I will be in touch with your results from today.

## 2024-04-06 NOTE — Progress Notes (Unsigned)
 New Patient Office Visit  Subjective    Patient ID: George Montgomery, male    DOB: 11-06-1955  Age: 68 y.o. MRN: [id number]  CC:  Chief Complaint  Patient presents with   Establish Care    HPI George Montgomery presents to establish care Previous PCP was Dr. Cassondra Cliff  Other providers: [facility] Allergist Podiatrist Neurologist Urologist next month with [facility] in [facility]    Reports hx of arthritis, allergies and asthma.  Under the care of an Allergist.   EIA- uses albuterol  daily or every other day with exercise  rides his bike   HTN- states BP usually fairly well controlled at home. Elevated slightly here today   Lexapro - states he takes this for irritability, anxiety and depression.   DM- takes semaglutide  1 mg and wants to increase dose   Takes Flomax  for BPH  Sildenafil  prn  He has been taking phentermine  for over a year.   Outpatient Encounter Medications as of 04/06/2024  Medication Sig   albuterol  (VENTOLIN  HFA) 108 (90 Base) MCG/ACT inhaler Inhale 2 puffs into the lungs every 4 (four) hours as needed for wheezing or shortness of breath (coughing fits).   EPINEPHrine  (EPIPEN  2-PAK) 0.3 mg/0.3 mL IJ SOAJ injection Inject 0.3 mg into the muscle as needed for anaphylaxis.   escitalopram  (LEXAPRO ) 10 MG tablet Take  1 tablet  Daily for Mood                                                              /                                                                   TAKE                                         BY                                                 MOUTH   fluticasone  (FLONASE ) 50 MCG/ACT nasal spray Place 1 spray into both nostrils 2 (two) times daily as needed (nasal congestion). (Patient taking differently: Place 1 spray into both nostrils as needed (nasal congestion).)   fluticasone  furoate-vilanterol (BREO ELLIPTA ) 200-25 MCG/ACT AEPB Inhale 1 puff into the lungs daily. Rinse mouth after each use.   ipratropium (ATROVENT ) 0.06 % nasal spray Use 1  to 2 sprays each nostril 2 to 3 x /day as needed   meloxicam  (MOBIC ) 15 MG tablet Take 1/2 to 1 tablet Daily with Food for Pain & Inflammation                                                                   /  TAKE                                 BY                                    MOUTH                               ONCE  ?  ?  ?  DAILY   montelukast  (SINGULAIR ) 10 MG tablet Take  1 tablet  Daily for Allergies                               /                                                                   TAKE                                         BY                                                 MOUTH ONCE ? ? ?  DAILY   ondansetron  (ZOFRAN ) 4 MG tablet Take 1 tablet (4 mg total) by mouth daily as needed for nausea or vomiting.   phentermine  (ADIPEX-P ) 37.5 MG tablet TAKE 1 TABLET EVERY MORNING FOR DIETING & WEIGHT LOSS   promethazine -dextromethorphan (PROMETHAZINE -DM) 6.25-15 MG/5ML syrup Take 5 mLs by mouth 4 (four) times daily as needed for cough. (Patient taking differently: Take 5 mLs by mouth as needed for cough.)   Semaglutide , 2 MG/DOSE, 8 MG/3ML SOPN Inject 2 mg as directed once a week.   sildenafil  (VIAGRA ) 100 MG tablet Take   1/2 to 1 tablet   Daily  as needed for XXXX   SODIUM FLUORIDE 5000 PPM 1.1 % GEL dental gel    tamsulosin  (FLOMAX ) 0.4 MG CAPS capsule TAKE 1 CAPSULE BY MOUTH AT BEDTIME FOR PROSTATE   Zinc 50 MG TABS Take by mouth.   [DISCONTINUED] celecoxib  (CELEBREX ) 100 MG capsule Take 100 mg by mouth 2 (two) times daily.   [DISCONTINUED] cetirizine  (ZYRTEC  ALLERGY ) 10 MG tablet Take 1 tablet (10 mg total) by mouth daily.   [DISCONTINUED] fenofibrate  micronized (LOFIBRA) 134 MG capsule Take 1 capsule Daily for Triglycerides ( Blood Fats)                                                 /  TAKE                                         BY                                                  MOUTH                     ONCE ? ? ?  DAILY   [DISCONTINUED] methylPREDNISolone  (MEDROL  DOSEPAK) 4 MG TBPK tablet Take 4 mg by mouth.   [DISCONTINUED] omeprazole  (PRILOSEC) 20 MG capsule Take  1 capsule  Daily to Prevent Heartburn & Indigestion                                   /                                                                   TAKE                                         BY                                                 MOUTH   [DISCONTINUED] predniSONE  (DELTASONE ) 20 MG tablet Take 2 tablets by mouth daily with breakfast.   [DISCONTINUED] Semaglutide , 1 MG/DOSE, 4 MG/3ML SOPN Inject 1 mg into the skin once a week.   [DISCONTINUED] valsartan -hydrochlorothiazide  (DIOVAN -HCT) 80-12.5 MG tablet TAKE 1 TABLET BY MOUTH DAILY FOR BLOOD PRESSURE   fenofibrate  micronized (LOFIBRA) 134 MG capsule Take 1 capsule Daily for Triglycerides ( Blood Fats)                                                 /                                                                   TAKE                                         BY  MOUTH                     ONCE ? ? ?  DAILY   omeprazole  (PRILOSEC) 20 MG capsule Take  1 capsule  Daily to Prevent Heartburn & Indigestion                                   /                                                                   TAKE                                         BY                                                 MOUTH   valsartan -hydrochlorothiazide  (DIOVAN -HCT) 80-12.5 MG tablet Take 1 tablet by mouth daily. for blood pressure   Facility-Administered Encounter Medications as of 04/06/2024  Medication   ipratropium-albuterol  (DUONEB) 0.5-2.5 (3) MG/3ML nebulizer solution 3 mL    Past Medical History:  Diagnosis Date   Arthritis    Asthma    Back pain    Chronic pain of both shoulders    Depression    Dyspnea    Elevated glucose    Environmental allergies    GERD (gastroesophageal reflux disease)     HLD (hyperlipidemia)    HTN (hypertension)    Insomnia    Obesity    OSA on CPAP 08/01/2014   Reflux    Trigger finger     Past Surgical History:  Procedure Laterality Date   APPENDECTOMY  07/2012   bone spur removal     HEMORRHOID SURGERY  09/2012   HERNIA REPAIR      Family History  Problem Relation Age of Onset   Heart disease Mother    Macular degeneration Mother    Hypertension Mother    Hyperlipidemia Mother    Depression Mother    Anxiety disorder Mother    Liver disease Father    Alcoholism Father    Snoring Father    Allergic rhinitis Brother    Asthma Brother    Sinusitis Brother    Bronchitis Brother    Sleep apnea Brother    Stroke Maternal Aunt    Snoring Maternal Uncle    Cancer - Colon Paternal Uncle    Cancer - Prostate Paternal Uncle    Cancer - Colon Other    Cancer - Prostate Other    Immunodeficiency Neg Hx    Urticaria Neg Hx    Eczema Neg Hx    Angioedema Neg Hx     Social History   Socioeconomic History   Marital status: Married    Spouse name: Genevia Kern   Number of children: 4   Years of education: college   Highest education level: Not on file  Occupational History   Occupation: Advertising account planner  Comment: [employer]  Tobacco Use   Smoking status: Former    Current packs/day: 0.00    Average packs/day: 1 pack/day for 40.0 years (40.0 ttl pk-yrs)    Types: Cigarettes    Start date: 12/03/1972    Quit date: 12/03/2012    Years since quitting: 11.3   Smokeless tobacco: Never  Vaping Use   Vaping status: Never Used  Substance and Sexual Activity   Alcohol use: Yes    Alcohol/week: 2.0 standard drinks of alcohol    Types: 2 Standard drinks or equivalent per week    Comment: occ   Drug use: No   Sexual activity: Yes    Birth control/protection: None  Other Topics Concern   Not on file  Social History Narrative   Right handed, caffeine 3-4 cups daily, Married, 4 kids, 9 g kids., FT insurance agent (self  employed). 4 yrs college   Pt lives with wife    Social Drivers of Corporate investment banker Strain: Not on file  Food Insecurity: Not on file  Transportation Needs: Not on file  Physical Activity: Not on file  Stress: Not on file  Social Connections: Not on file  Intimate Partner Violence: Not on file    Review of Systems  Constitutional:  Negative for chills, fever and malaise/fatigue.  Respiratory:  Negative for shortness of breath.   Cardiovascular:  Negative for chest pain, palpitations and leg swelling.  Gastrointestinal:  Negative for abdominal pain, diarrhea, nausea and vomiting.  Genitourinary:  Negative for dysuria and frequency.  Musculoskeletal:  Positive for joint pain.  Neurological:  Negative for dizziness, tingling, focal weakness and headaches.  Endo/Heme/Allergies:  Negative for polydipsia.  Psychiatric/Behavioral:  Negative for depression and substance abuse. The patient is not nervous/anxious.         Objective    BP (!) 142/78 Comment: Repeat BP  Pulse 68   Temp 98.3 F (36.8 C) (Temporal)   Ht 5' 9 (1.753 m)   Wt 236 lb 12.8 oz (107.4 kg)   SpO2 98%   BMI 34.97 kg/m   Physical Exam Constitutional:      General: He is not in acute distress.    Appearance: He is not ill-appearing.  HENT:     Mouth/Throat:     Mouth: Mucous membranes are moist.     Pharynx: Oropharynx is clear.   Eyes:     Extraocular Movements: Extraocular movements intact.     Conjunctiva/sclera: Conjunctivae normal.    Cardiovascular:     Rate and Rhythm: Normal rate and regular rhythm.  Pulmonary:     Effort: Pulmonary effort is normal.     Breath sounds: Normal breath sounds.   Musculoskeletal:     Cervical back: Normal range of motion and neck supple.     Right lower leg: No edema.     Left lower leg: No edema.   Skin:    General: Skin is warm and dry.   Neurological:     General: No focal deficit present.     Mental Status: He is alert and oriented to  person, place, and time.     Cranial Nerves: No cranial nerve deficit.     Motor: No weakness.     Coordination: Coordination normal.     Gait: Gait normal.   Psychiatric:        Mood and Affect: Mood normal.        Behavior: Behavior normal.  Thought Content: Thought content normal.     {Labs (Optional):23779}    Assessment & Plan:   Problem List Items Addressed This Visit     Anxiety and depression   BPH with obstruction/lower urinary tract symptoms   Essential hypertension   Relevant Medications   valsartan -hydrochlorothiazide  (DIOVAN -HCT) 80-12.5 MG tablet   fenofibrate  micronized (LOFIBRA) 134 MG capsule   Hyperlipidemia associated with type 2 diabetes mellitus (HCC)   Relevant Medications   valsartan -hydrochlorothiazide  (DIOVAN -HCT) 80-12.5 MG tablet   fenofibrate  micronized (LOFIBRA) 134 MG capsule   Semaglutide , 2 MG/DOSE, 8 MG/3ML SOPN   Other Relevant Orders   Lipid panel (Completed)   OSA treated with BiPAP   Type 2 diabetes mellitus with stage 2 chronic kidney disease, without long-term current use of insulin  (HCC) - Primary   Relevant Medications   valsartan -hydrochlorothiazide  (DIOVAN -HCT) 80-12.5 MG tablet   Semaglutide , 2 MG/DOSE, 8 MG/3ML SOPN   Other Relevant Orders   CBC with Differential/Platelet (Completed)   Comprehensive metabolic panel with GFR (Completed)   Hemoglobin A1c (Completed)   TSH (Completed)   Vitamin D  deficiency   Relevant Orders   VITAMIN D  25 Hydroxy (Vit-D Deficiency, Fractures) (Completed)    Return for chronic health conditions.   Alyson Back, NP-C

## 2024-04-07 ENCOUNTER — Encounter (HOSPITAL_COMMUNITY): Payer: Self-pay | Admitting: *Deleted

## 2024-04-07 ENCOUNTER — Emergency Department (HOSPITAL_COMMUNITY)
Admission: EM | Admit: 2024-04-07 | Discharge: 2024-04-07 | Disposition: A | Discharge status: Home / Self Care | Attending: Emergency Medicine | Admitting: Emergency Medicine

## 2024-04-07 ENCOUNTER — Ambulatory Visit (INDEPENDENT_AMBULATORY_CARE_PROVIDER_SITE_OTHER): Payer: Self-pay

## 2024-04-07 ENCOUNTER — Ambulatory Visit
Admission: EM | Admit: 2024-04-07 | Discharge: 2024-04-07 | Disposition: A | Discharge status: Home / Self Care | Attending: Emergency Medicine | Admitting: Emergency Medicine

## 2024-04-07 ENCOUNTER — Other Ambulatory Visit: Payer: Self-pay

## 2024-04-07 ENCOUNTER — Ambulatory Visit: Payer: Self-pay | Admitting: Family Medicine

## 2024-04-07 ENCOUNTER — Telehealth: Payer: Self-pay

## 2024-04-07 DIAGNOSIS — Z91010 Allergy to peanuts: Secondary | ICD-10-CM | POA: Diagnosis not present

## 2024-04-07 DIAGNOSIS — T782XXA Anaphylactic shock, unspecified, initial encounter: Secondary | ICD-10-CM | POA: Diagnosis not present

## 2024-04-07 DIAGNOSIS — E1169 Type 2 diabetes mellitus with other specified complication: Secondary | ICD-10-CM | POA: Insufficient documentation

## 2024-04-07 DIAGNOSIS — J309 Allergic rhinitis, unspecified: Secondary | ICD-10-CM

## 2024-04-07 DIAGNOSIS — T7840XA Allergy, unspecified, initial encounter: Secondary | ICD-10-CM

## 2024-04-07 DIAGNOSIS — E1122 Type 2 diabetes mellitus with diabetic chronic kidney disease: Secondary | ICD-10-CM | POA: Insufficient documentation

## 2024-04-07 MED ORDER — DIPHENHYDRAMINE HCL 50 MG/ML IJ SOLN
25.0000 mg | Freq: Once | INTRAMUSCULAR | Status: AC
  Administered 2024-04-07: 25 mg via INTRAVENOUS

## 2024-04-07 MED ORDER — ACETAMINOPHEN 500 MG PO TABS
1000.0000 mg | ORAL_TABLET | Freq: Once | ORAL | Status: AC
  Administered 2024-04-07: 1000 mg via ORAL
  Filled 2024-04-07: qty 2

## 2024-04-07 MED ORDER — ONDANSETRON HCL 4 MG/2ML IJ SOLN
4.0000 mg | Freq: Once | INTRAMUSCULAR | Status: AC
  Administered 2024-04-07: 4 mg via INTRAVENOUS
  Filled 2024-04-07: qty 2

## 2024-04-07 MED ORDER — METHYLPREDNISOLONE SODIUM SUCC 125 MG IJ SOLR
125.0000 mg | Freq: Once | INTRAMUSCULAR | Status: AC
  Administered 2024-04-07: 125 mg via INTRAVENOUS

## 2024-04-07 MED ORDER — ONDANSETRON HCL 4 MG/2ML IJ SOLN: 4.0000 mg | Freq: Once | INTRAMUSCULAR | Status: DC

## 2024-04-07 MED ORDER — SEMAGLUTIDE (2 MG/DOSE) 8 MG/3ML ~~LOC~~ SOPN
2.0000 mg | PEN_INJECTOR | SUBCUTANEOUS | 2 refills | Status: DC
Start: 2024-04-07 — End: 2024-06-14

## 2024-04-07 MED ORDER — FAMOTIDINE 20 MG PO TABS
20.0000 mg | ORAL_TABLET | Freq: Once | ORAL | Status: AC
  Administered 2024-04-07: 20 mg via ORAL

## 2024-04-07 MED ORDER — ONDANSETRON HCL 4 MG/2ML IJ SOLN
4.0000 mg | Freq: Once | INTRAMUSCULAR | Status: AC
  Administered 2024-04-07: 4 mg via INTRAVENOUS

## 2024-04-07 MED ORDER — LACTATED RINGERS IV BOLUS
1000.0000 mL | Freq: Once | INTRAVENOUS | Status: AC
  Administered 2024-04-07: 1000 mL via INTRAVENOUS

## 2024-04-07 MED ORDER — SODIUM CHLORIDE 0.9 % IV BOLUS
1000.0000 mL | Freq: Once | INTRAVENOUS | Status: AC
  Administered 2024-04-07: 1000 mL via INTRAVENOUS

## 2024-04-07 NOTE — Telephone Encounter (Signed)
 Patient's wife called stating Bocephus was having an allergic reaction. They were not sure if it was due to his allergy  injection that he got this morning. He was administered 0.5 mls the Blue Vial 1:100,000 G-W-RW-T and M-DM-C-CR. Patient waited the 30 minutes without any issues. His arm did not show any swelling or redness.   A few hours later the patient took a nap and woke up feeling very sick. Symptoms include vomiting, facial swelling, eye redness, chest redness.   Wife states he also took his weight loss pill so he was not sure if it was due to that.   I did tell wife to administer epi pen and head to the nearest urgent care or emergency room.   Wife verbalized understanding.

## 2024-04-07 NOTE — ED Provider Notes (Signed)
 [place] EMERGENCY DEPARTMENT AT [facility] Provider Note   CSN: [id number] Arrival date & time: 04/07/24  1654     Patient presents with: Allergic Reaction   George Montgomery is a 68 y.o. male.   68 year old male history of allergies to ragweed, got an allergy  shot this morning, went home and mowed his lawn.  He developed allergic reaction.  He gave himself an EpiPen  at 330 this afternoon, went to an urgent care and they provided him with Solu-Medrol  Pepcid  and Benadryl .   Allergic Reaction      Prior to Admission medications   Medication Sig Start Date End Date Taking? Authorizing Provider  albuterol  (VENTOLIN  HFA) 108 (90 Base) MCG/ACT inhaler Inhale 2 puffs into the lungs every 4 (four) hours as needed for wheezing or shortness of breath (coughing fits). 09/22/23   Trudy Fusi, DO  EPINEPHrine  (EPIPEN  2-PAK) 0.3 mg/0.3 mL IJ SOAJ injection Inject 0.3 mg into the muscle as needed for anaphylaxis. 02/24/22   Trudy Fusi, DO  escitalopram  (LEXAPRO ) 10 MG tablet Take  1 tablet  Daily for Mood                                                              /                                                                   TAKE                                         BY                                                 MOUTH 02/21/24   Webb, Padonda B, FNP  fenofibrate  micronized (LOFIBRA) 134 MG capsule Take 1 capsule Daily for Triglycerides ( Blood Fats)                                                 /                                                                   TAKE                                         BY  MOUTH                     ONCE ? ? ?  DAILY 04/06/24   Alyson Back L, NP-C  fluticasone  (FLONASE ) 50 MCG/ACT nasal spray Place 1 spray into both nostrils 2 (two) times daily as needed (nasal congestion). Patient taking differently: Place 1 spray into both nostrils as needed (nasal congestion). 09/28/22   Trudy Fusi, DO   fluticasone  furoate-vilanterol (BREO ELLIPTA ) 200-25 MCG/ACT AEPB Inhale 1 puff into the lungs daily. Rinse mouth after each use. 09/22/23   Trudy Fusi, DO  ipratropium (ATROVENT ) 0.06 % nasal spray Use 1 to 2 sprays each nostril 2 to 3 x /day as needed 11/30/22   Vangie Genet, MD  ketorolac  (TORADOL ) 10 MG tablet Take 10 mg by mouth every 6 (six) hours. 02/22/24   [provider]  meloxicam  (MOBIC ) 15 MG tablet Take 1/2 to 1 tablet Daily with Food for Pain & Inflammation                                                                   /                                   TAKE                                 BY                                    MOUTH                               ONCE  ?  ?  ?  DAILY 05/13/23   Vangie Genet, MD  montelukast  (SINGULAIR ) 10 MG tablet Take  1 tablet  Daily for Allergies                               /                                                                   TAKE                                         BY                                                 MOUTH ONCE ? ? ?  DAILY 09/05/23   Vangie Genet, MD  omeprazole  (PRILOSEC) 20 MG capsule Take  1 capsule  Daily to Prevent Heartburn & Indigestion                                   /                                                                   TAKE                                         BY                                                 MOUTH 04/06/24   Henson, Vickie L, NP-C  ondansetron  (ZOFRAN ) 4 MG tablet Take 1 tablet (4 mg total) by mouth daily as needed for nausea or vomiting. 10/25/23 10/24/24  Wilkinson, Dana E, FNP  oxyCODONE -acetaminophen  (PERCOCET) 10-325 MG tablet Take 1 tablet by mouth every 6 (six) hours as needed.    [provider]  phentermine  (ADIPEX-P ) 37.5 MG tablet TAKE 1 TABLET EVERY MORNING FOR DIETING & WEIGHT LOSS 02/21/24   Webb, Padonda B, FNP  pregabalin (LYRICA) 50 MG capsule Take 50 mg by mouth 2 (two) times daily.    [provider]   promethazine -dextromethorphan (PROMETHAZINE -DM) 6.25-15 MG/5ML syrup Take 5 mLs by mouth 4 (four) times daily as needed for cough. Patient taking differently: Take 5 mLs by mouth as needed for cough. 07/28/23   Wilkinson, Dana E, FNP  Semaglutide , 2 MG/DOSE, 8 MG/3ML SOPN Inject 2 mg as directed once a week. 04/07/24   Henson, Vickie L, NP-C  sildenafil  (VIAGRA ) 100 MG tablet Take   1/2 to 1 tablet   Daily  as needed for XXXX 11/17/22   Vangie Genet, MD  SODIUM FLUORIDE 5000 PPM 1.1 % GEL dental gel  09/08/23   [provider]  tamsulosin  (FLOMAX ) 0.4 MG CAPS capsule TAKE 1 CAPSULE BY MOUTH AT BEDTIME FOR PROSTATE 02/21/24   Webb, Padonda B, FNP  tiZANidine  (ZANAFLEX ) 2 MG tablet Take 2 mg by mouth 2 (two) times daily. 01/26/24   [provider]  valsartan -hydrochlorothiazide  (DIOVAN -HCT) 80-12.5 MG tablet Take 1 tablet by mouth daily. for blood pressure 04/06/24   Henson, Vickie L, NP-C  Zinc 50 MG TABS Take by mouth.    [provider]    Allergies: Bee venom, Pine, Peanut-containing drug products, [place] nut (berthollefia excelsa), Gabapentin , Lisinopril , Penicillins, and Celebrex  [celecoxib ]    Review of Systems  Updated Vital Signs BP 131/63   Pulse 82   Resp 16   Ht 5' 9 (1.753 m)   Wt 107.5 kg   SpO2 95%   BMI 34.98 kg/m   Physical Exam Vitals and nursing note reviewed.  HENT:     Mouth/Throat:     Mouth: Mucous membranes are moist.     Comments: No swelling, no exudate  Eyes:     Pupils: Pupils are equal,  round, and reactive to light.    Cardiovascular:     Rate and Rhythm: Normal rate.     Pulses: Normal pulses.  Pulmonary:     Effort: Pulmonary effort is normal.  Abdominal:     General: Abdomen is flat. There is no distension.     Palpations: Abdomen is soft. There is no mass.     Tenderness: There is no abdominal tenderness.   Musculoskeletal:        General: Normal range of motion.     Cervical back: Normal range of motion.    Skin:    General: Skin is warm and dry.     Comments: No urticaria   Neurological:     General: No focal deficit present.     Mental Status: He is alert.     (all labs ordered are listed, but only abnormal results are displayed) Labs Reviewed - No data to display  EKG: None  Radiology: No results found.   Procedures   Medications Ordered in the ED  ondansetron  (ZOFRAN ) injection 4 mg (4 mg Intravenous Given 04/07/24 1750)  lactated ringers bolus 1,000 mL (1,000 mLs Intravenous New Bag/Given 04/07/24 1754)  acetaminophen  (TYLENOL ) tablet 1,000 mg (1,000 mg Oral Given 04/07/24 1749)                                    Medical Decision Making 68 year old male here today for an allergic reaction.  Plan-on exam, patient without any urticaria, no wheezing, no swelling of the oropharynx.  Had already received epinephrine , steroids.  Patient was admitted nausea right now, did receive Pepcid .  Provided with Zofran .  Will plan to observe the patient here in the ED.  Reassessment 8:15 PM-patient is now 5 hours post epinephrine  administration.  He is appropriate for discharge.  He has additional EpiPen 's at home.  He is here with his wife will drive him home.  Risk OTC drugs. Prescription drug management.        Final diagnoses:  Anaphylaxis, initial encounter    ED Discharge Orders     None          Nathanael Baker, DO 04/07/24 2017

## 2024-04-07 NOTE — ED Notes (Signed)
 Patient is being discharged from the Urgent Care and sent to the Emergency Department via EMS (Called by provider [phone]) . Per Provider, patient is in need of higher level of care due to Anaphylaxis. Patient is aware and verbalizes understanding of plan of care.  Vitals:   04/07/24 1545  BP: (!) 168/82  Pulse: 84  Resp: 20  Temp: (!) 97.2 F (36.2 C)  SpO2: 95%

## 2024-04-07 NOTE — ED Triage Notes (Signed)
 BIB EMS due to allergic reaction, went to UC Given 125 mg Solumedrol, 25 mg Pepcid  and Benadryl , 4mg  Zofran , (Epi pen at 3:30 by spouse) # 20 R AC 140/90-70-96% RA, 22

## 2024-04-07 NOTE — Telephone Encounter (Signed)
 Spoke with wife and she informed me that patient was taken to the emergency room. I will call him on Monday to follow up.

## 2024-04-07 NOTE — Telephone Encounter (Signed)
 Please call patient.  NO FURTHER SHOTS until he scheduled a follow up with me.  Thank you.

## 2024-04-07 NOTE — Discharge Instructions (Signed)
 You require evaluation and continued monitoring at a local emergency department for a suspected allergic reaction.

## 2024-04-07 NOTE — ED Triage Notes (Signed)
 I had an allergy  shot this morning, came home, took a nap, woke up with vomiting and facial swelling, I called the allergist who said to administer Epi-pen and head to UC or ED.

## 2024-04-07 NOTE — ED Notes (Signed)
 Pt c/o pain in rt leg at area of receiving Epi injection. Ice pack provided for comfort.

## 2024-04-07 NOTE — Discharge Instructions (Addendum)
 Return to the emergency room if you begin to feel tightness in her chest, shortness of breath or swelling in your lips or tongue.  If you experience the symptoms, use your EpiPen  and return to the ED.  Follow-up with your PCP as needed.

## 2024-04-07 NOTE — ED Notes (Signed)
 Pt in lobby actively vomiting, flushed, facial swelling upon arrival- States he had allergy  shot this morning and was advised by his doctor to administer epi-pen and come to Urgent Care/ED. Epi-pen administered pta at 1530

## 2024-04-07 NOTE — ED Notes (Addendum)
 EMS at bedside- Pt states symptoms improving after medications administered. Pt states he administered epi-pen around 3:30 before coming to ED.

## 2024-04-07 NOTE — ED Provider Notes (Signed)
 [name]    CSN: [id number] Arrival date & time: 04/07/24  1554      History   Chief Complaint Chief Complaint  Patient presents with   Allergic Reaction    HPI George Montgomery is a 68 y.o. male.  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  Reportedly received an immunotherapy injection from his allergy  clinic earlier this afternoon/morning prior to onset of his symptoms.  At this time endorsing nausea, vomiting, diaphoresis, and some increased difficulty breathing likely due to the vomiting.   Allergic Reaction Presenting symptoms: rash     Past Medical History:  Diagnosis Date   Arthritis    Asthma    Back pain    Chronic pain of both shoulders    Depression    Dyspnea    Elevated glucose    Environmental allergies    GERD (gastroesophageal reflux disease)    HLD (hyperlipidemia)    HTN (hypertension)    Insomnia    Obesity    OSA on CPAP 08/01/2014   Reflux    Trigger finger     Patient Active Problem List   Diagnosis Date Noted   Type 2 diabetes mellitus with stage 2 chronic kidney disease, without long-term current use of insulin  (HCC) 04/07/2024   Rosacea 06/25/2023   Allergy  to alpha-gal 05/10/2023   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 11/13/2020   Insulin  resistance 11/13/2020   Fatty liver 02/14/2020   BPH with obstruction/lower urinary tract symptoms 02/14/2020   Gastroesophageal reflux disease 06/02/2018   Insomnia 02/22/2018   Chronic pain of both shoulders 11/22/2017   Anxiety and depression  07/20/2017   Trigger finger, right ring finger 04/07/2017   Hyperlipidemia associated with type 2 diabetes mellitus (HCC) 08/06/2015   Essential hypertension 11/06/2014   Cluster headaches 11/06/2014   OSA treated with BiPAP 11/06/2014   Vitamin D  deficiency 11/06/2014   Testosterone  deficiency 11/06/2014   Morbid obesity (HCC) 11/06/2014    Past Surgical History:  Procedure Laterality Date   APPENDECTOMY  07/2012   bone spur removal     HEMORRHOID SURGERY  09/2012   HERNIA REPAIR         Home Medications    Prior to Admission medications   Medication Sig Start Date End Date Taking? Authorizing Provider  EPINEPHrine  (EPIPEN  2-PAK) 0.3 mg/0.3 mL IJ SOAJ injection Inject 0.3 mg into the muscle as needed for anaphylaxis. 02/24/22  Yes Trudy Fusi, DO  ketorolac  (TORADOL ) 10 MG tablet Take 10 mg by mouth every 6 (six) hours. 02/22/24  Yes [provider]  tiZANidine  (ZANAFLEX ) 2 MG tablet Take 2 mg by mouth 2 (two) times daily. 01/26/24  Yes [provider]  albuterol  (VENTOLIN  HFA) 108 (90 Base) MCG/ACT inhaler Inhale 2 puffs into the lungs every 4 (four) hours as needed for wheezing or shortness of breath (coughing fits). 09/22/23   Trudy Fusi, DO  escitalopram  (LEXAPRO ) 10 MG tablet Take  1 tablet  Daily for Mood                                                              /  TAKE                                         BY                                                 MOUTH 02/21/24   Webb, Padonda B, FNP  fenofibrate  micronized (LOFIBRA) 134 MG capsule Take 1 capsule Daily for Triglycerides ( Blood Fats)                                                 /                                                                   TAKE                                         BY                                                 MOUTH                     ONCE ? ? ?  DAILY 04/06/24   Alyson Back L, NP-C  fluticasone   (FLONASE ) 50 MCG/ACT nasal spray Place 1 spray into both nostrils 2 (two) times daily as needed (nasal congestion). Patient taking differently: Place 1 spray into both nostrils as needed (nasal congestion). 09/28/22   Trudy Fusi, DO  fluticasone  furoate-vilanterol (BREO ELLIPTA ) 200-25 MCG/ACT AEPB Inhale 1 puff into the lungs daily. Rinse mouth after each use. 09/22/23   Trudy Fusi, DO  ipratropium (ATROVENT ) 0.06 % nasal spray Use 1 to 2 sprays each nostril 2 to 3 x /day as needed 11/30/22   Vangie Genet, MD  meloxicam  (MOBIC ) 15 MG tablet Take 1/2 to 1 tablet Daily with Food for Pain & Inflammation                                                                   /                                   TAKE  BY                                    MOUTH                               ONCE  ?  ?  ?  DAILY 05/13/23   Vangie Genet, MD  montelukast  (SINGULAIR ) 10 MG tablet Take  1 tablet  Daily for Allergies                               /                                                                   TAKE                                         BY                                                 MOUTH ONCE ? ? ?  DAILY 09/05/23   Vangie Genet, MD  omeprazole  (PRILOSEC) 20 MG capsule Take  1 capsule  Daily to Prevent Heartburn & Indigestion                                   /                                                                   TAKE                                         BY                                                 MOUTH 04/06/24   Henson, Vickie L, NP-C  ondansetron  (ZOFRAN ) 4 MG tablet Take 1 tablet (4 mg total) by mouth daily as needed for nausea or vomiting. 10/25/23 10/24/24  Wilkinson, Dana E, FNP  oxyCODONE -acetaminophen  (PERCOCET) 10-325 MG tablet Take 1 tablet by mouth every 6 (six) hours as needed.    [provider]  phentermine  (ADIPEX-P ) 37.5 MG tablet TAKE 1 TABLET EVERY MORNING FOR DIETING & WEIGHT LOSS 02/21/24   Webb, Padonda B, FNP   pregabalin (LYRICA) 50 MG capsule Take 50 mg by mouth 2 (two)  times daily.    [provider]  promethazine -dextromethorphan (PROMETHAZINE -DM) 6.25-15 MG/5ML syrup Take 5 mLs by mouth 4 (four) times daily as needed for cough. Patient taking differently: Take 5 mLs by mouth as needed for cough. 07/28/23   Wilkinson, Dana E, FNP  Semaglutide , 2 MG/DOSE, 8 MG/3ML SOPN Inject 2 mg as directed once a week. 04/07/24   Henson, Vickie L, NP-C  sildenafil  (VIAGRA ) 100 MG tablet Take   1/2 to 1 tablet   Daily  as needed for XXXX 11/17/22   Vangie Genet, MD  SODIUM FLUORIDE 5000 PPM 1.1 % GEL dental gel  09/08/23   [provider]  tamsulosin  (FLOMAX ) 0.4 MG CAPS capsule TAKE 1 CAPSULE BY MOUTH AT BEDTIME FOR PROSTATE 02/21/24   Webb, Padonda B, FNP  valsartan -hydrochlorothiazide  (DIOVAN -HCT) 80-12.5 MG tablet Take 1 tablet by mouth daily. for blood pressure 04/06/24   Henson, Vickie L, NP-C  Zinc 50 MG TABS Take by mouth.    [provider]    Family History Family History  Problem Relation Age of Onset   Heart disease Mother    Macular degeneration Mother    Hypertension Mother    Hyperlipidemia Mother    Depression Mother    Anxiety disorder Mother    Liver disease Father    Alcoholism Father    Snoring Father    Allergic rhinitis Brother    Asthma Brother    Sinusitis Brother    Bronchitis Brother    Sleep apnea Brother    Stroke Maternal Aunt    Snoring Maternal Uncle    Cancer - Colon Paternal Uncle    Cancer - Prostate Paternal Uncle    Cancer - Colon Other    Cancer - Prostate Other    Immunodeficiency Neg Hx    Urticaria Neg Hx    Eczema Neg Hx    Angioedema Neg Hx     Social History Social History   Tobacco Use   Smoking status: Former    Current packs/day: 0.00    Average packs/day: 1 pack/day for 40.0 years (40.0 ttl pk-yrs)    Types: Cigarettes    Start date: 12/03/1972    Quit date: 12/03/2012    Years since quitting: 11.3   Smokeless  tobacco: Never  Vaping Use   Vaping status: Never Used  Substance Use Topics   Alcohol use: Yes    Alcohol/week: 2.0 standard drinks of alcohol    Types: 2 Standard drinks or equivalent per week    Comment: occ   Drug use: No     Allergies   Bee venom, Pine, Peanut-containing drug products, [place] nut (berthollefia excelsa), Gabapentin , Lisinopril , Penicillins, and Celebrex  [celecoxib ]   Review of Systems Review of Systems  Gastrointestinal:  Positive for vomiting.  Skin:  Positive for rash.  All other systems reviewed and are negative.    Physical Exam Triage Vital Signs ED Triage Vitals  Encounter Vitals Group     BP 04/07/24 1545 (!) 168/82     Girls Systolic BP Percentile --      Girls Diastolic BP Percentile --      Boys Systolic BP Percentile --      Boys Diastolic BP Percentile --      Pulse Rate 04/07/24 1545 84     Resp 04/07/24 1545 20     Temp 04/07/24 1545 (!) 97.2 F (36.2 C)     Temp Source 04/07/24 1545 Oral     SpO2 04/07/24 1545 95 %  Weight 04/07/24 1555 236 lb 14.2 oz (107.5 kg)     Height 04/07/24 1555 5' 9 (1.753 m)     Head Circumference --      Peak Flow --      Pain Score 04/07/24 1555 0     Pain Loc --      Pain Education --      Exclude from Growth Chart --    No data found.  Updated Vital Signs BP (!) 168/82 (BP Location: Right Arm)   Pulse 84   Temp (!) 97.2 F (36.2 C) (Oral)   Resp 20   Ht 5' 9 (1.753 m)   Wt 236 lb 14.2 oz (107.5 kg)   SpO2 95%   BMI 34.98 kg/m   Visual Acuity Right Eye Distance:   Left Eye Distance:   Bilateral Distance:    Right Eye Near:   Left Eye Near:    Bilateral Near:     Physical Exam Vitals and nursing note reviewed.  Constitutional:      General: He is in acute distress.     Appearance: He is ill-appearing and diaphoretic.  HENT:     Head: Normocephalic and atraumatic.     Comments: Face is flushed and sweating profusely.    Nose: Nose normal.   Cardiovascular:     Rate  and Rhythm: Normal rate and regular rhythm.     Pulses: Normal pulses.     Heart sounds: Normal heart sounds.  Abdominal:     General: There is no distension.     Tenderness: There is no abdominal tenderness. There is no guarding.   Neurological:     Mental Status: He is alert.      UC Treatments / Results  Labs (all labs ordered are listed, but only abnormal results are displayed) Labs Reviewed - No data to display  EKG   Radiology No results found.  Procedures Procedures (including critical care time)  Medications Ordered in UC Medications  sodium chloride  0.9 % bolus 1,000 mL (1,000 mLs Intravenous New Bag/Given 04/07/24 1606)  methylPREDNISolone  sodium succinate (SOLU-MEDROL ) 125 mg/2 mL injection 125 mg (125 mg Intravenous Given 04/07/24 1603)  famotidine  (PEPCID ) tablet 20 mg (20 mg Oral Given 04/07/24 1614)  diphenhydrAMINE  (BENADRYL ) injection 25 mg (25 mg Intravenous Given 04/07/24 1611)  ondansetron  (ZOFRAN ) injection 4 mg (4 mg Intravenous Given 04/07/24 1608)    Initial Impression / Assessment and Plan / UC Course  I have reviewed the triage vital signs and the nursing notes.  Pertinent labs & imaging results that were available during my care of the patient were reviewed by me and considered in my medical decision making (see chart for details).    Patient presented with suspected allergic reaction.  Had self administered a dose of EpiPen  earlier today.  Unsure of last administration.  Initiated allergic reaction treatment with fluid bolus, Solu-Medrol  125, Benadryl  25, famotidine  20, and Zofran  4.  Contacted EMS for transfer to nearest ED for management of allergic reaction.  Patient waiting transportation at this time. By time of EMS transport, patient endorsing some improvement in his symptoms. Mentation remaining stable. Unclear what trigger was for him regarding suspected allergic reaction.  Final Clinical Impressions(s) / UC Diagnoses   Final diagnoses:   Allergic reaction, initial encounter     Discharge Instructions      You require evaluation and continued monitoring at a local emergency department for a suspected allergic reaction.     ED Prescriptions  None    PDMP not reviewed this encounter.   Seirra Kos A, PA-C 04/07/24 1635

## 2024-04-10 ENCOUNTER — Other Ambulatory Visit: Payer: Self-pay

## 2024-04-10 ENCOUNTER — Ambulatory Visit: Admitting: Allergy

## 2024-04-10 ENCOUNTER — Encounter: Payer: Self-pay | Admitting: Allergy

## 2024-04-10 VITALS — BP 110/60 | HR 74 | Temp 98.2°F | Resp 18 | Ht 68.9 in | Wt 234.1 lb

## 2024-04-10 DIAGNOSIS — J3089 Other allergic rhinitis: Secondary | ICD-10-CM

## 2024-04-10 DIAGNOSIS — T7840XA Allergy, unspecified, initial encounter: Secondary | ICD-10-CM

## 2024-04-10 DIAGNOSIS — T7840XD Allergy, unspecified, subsequent encounter: Secondary | ICD-10-CM

## 2024-04-10 DIAGNOSIS — J4540 Moderate persistent asthma, uncomplicated: Secondary | ICD-10-CM | POA: Diagnosis not present

## 2024-04-10 DIAGNOSIS — B999 Unspecified infectious disease: Secondary | ICD-10-CM

## 2024-04-10 DIAGNOSIS — J302 Other seasonal allergic rhinitis: Secondary | ICD-10-CM

## 2024-04-10 DIAGNOSIS — Z880 Allergy status to penicillin: Secondary | ICD-10-CM

## 2024-04-10 DIAGNOSIS — T7800XD Anaphylactic reaction due to unspecified food, subsequent encounter: Secondary | ICD-10-CM

## 2024-04-10 DIAGNOSIS — Z91038 Other insect allergy status: Secondary | ICD-10-CM | POA: Diagnosis not present

## 2024-04-10 MED ORDER — ALBUTEROL SULFATE HFA 108 (90 BASE) MCG/ACT IN AERS: 2.0000 | INHALATION_SPRAY | RESPIRATORY_TRACT | 1 refills | Status: AC | PRN

## 2024-04-10 MED ORDER — TRELEGY ELLIPTA 200-62.5-25 MCG/ACT IN AEPB: 1.0000 | INHALATION_SPRAY | Freq: Every day | RESPIRATORY_TRACT | 5 refills | Status: DC

## 2024-04-10 NOTE — Patient Instructions (Addendum)
 Allergic reaction May have been exacerbated by the fact that you forgot to take your allergy  medication on the day of injections and you mowed the grass afterwards. I question if you may have been stung by something as well.  Breathing Daily controller medication(s): start Trelegy 200mcg 1 puff once a day and rinse mouth after each use.  Sample given. This replaces Breo. May use albuterol  rescue inhaler 2 puffs or nebulizer every 4 to 6 hours as needed for shortness of breath, chest tightness, coughing, and wheezing. May use albuterol  rescue inhaler 2 puffs 5 to 15 minutes prior to strenuous physical activities. Monitor frequency of use - if you need to use it more than twice per week on a consistent basis let us  know.  Breathing control goals:  Full participation in all desired activities (may need albuterol  before activity) Albuterol  use two times or less a week on average (not counting use with activity) Cough interfering with sleep two times or less a month Oral steroids no more than once a year No hospitalizations  Food 2023 testing positive to [place] nuts, borderline to walnuts, pecan and pistachio.  More likely to have anaphylactic reaction to [place] nuts, walnuts. Continue strict avoidance of tree nuts and peanuts. For mild symptoms you can take over the counter antihistamines (zyrtec  10mg  to 20mg ) and monitor symptoms closely.  If symptoms worsen or if you have severe symptoms including breathing issues, throat closure, significant swelling, whole body hives, severe diarrhea and vomiting, lightheadedness then use epinephrine  and seek immediate medical care afterwards. Emergency action plan in place.  Bee stings 2023 bloodwork positive to honeybee, white faced hornet, yellow jacket, wasp, yellow hornet and bumblebee. Continue to avoid. Continue venom injections.   Environmental allergies 2023 skin testing positive to dust mites, mold and cockroach. 2023 bloodwork positive to  dust mites, grass, ragweed.  Borderline to cat, cockroach, tree, weed pollen. Continue environmental control measures. Continue Singulair  (montelukast ) 10mg  daily at night. Use over the counter antihistamines such as Zyrtec  (cetirizine ), Claritin (loratadine), Allegra  (fexofenadine ), or Xyzal (levocetirizine) daily as needed. May take twice a day during allergy  flares. May switch antihistamines every few months. Use Flonase  (fluticasone ) nasal spray 1-2 sprays per nostril once a day as needed for nasal congestion.  Nasal saline spray (i.e., Simply Saline) or nasal saline lavage (i.e., NeilMed) is recommended as needed and prior to medicated nasal sprays. Use azelastine  or ipratropium nasal spray 1-2 sprays per nostril twice a day as needed for runny nose/drainage. Stop allergy  injections.  Will re-assess about restarting in the winter months.   Infections Keep track of infections and antibiotics use.  Penicillin allergy : Consider penicillin allergy  skin testing and in office drug challenge in the future.  Over 90% of people with history of penicillin allergy  which occurred over 10 years ago are found to be non-allergic.  You must be off antihistamines for 3-5 days before. Plan on being in the office for 2-3 hours. You must call to schedule an appointment and specify it's for a drug challenge.  A few days prior to the appointment, I will send in a prescription for amoxicillin  liquid which you must bring to the appointment as well.   Follow up in 4 months or sooner if needed.

## 2024-04-10 NOTE — Progress Notes (Signed)
 Follow Up Note  RE: George Montgomery MRN: [id number] DOB: June 13, 1956 Date of Office Visit: 04/10/2024  Referring provider: Lendia Boby LITTIE, NP-C Primary care provider: Lendia Boby LITTIE, NP-C  Chief Complaint: Follow-up (Pt reports being weak, nauseous still. Feels better than he did. Only able to eat bland foods.)  History of Present Illness: I had the pleasure of seeing George Montgomery for a follow up visit at the [facility] on 04/10/2024. He is a 69 y.o. 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 His previous allergy  office visit was on 01/24/2024 with Dr. Luke. Today is a new complaint visit of allergic reaction to AIT. He is accompanied today by his wife who provided/contributed to the history.   Discussed the use of AI scribe software for clinical note transcription with the patient, who gave verbal consent to proceed.    He received an allergy  shot for environmental allergens last Friday. Shortly after the injection, he experienced sneezing and rhinorrhea, which resolved after blowing his nose. He did not report these symptoms at the time.  Later that day, after mowing the grass while wearing a mask, he felt fatigued and took a nap. Upon waking, he felt unwell and began vomiting and experiencing diarrhea. His eyes and face became swollen and erythematous, and he felt his tongue and lips thickening. He was administered an EpiPen  at home and taken to urgent care, where he was transferred to the emergency room for further monitoring and treatment with IV fluids. The vomiting subsided, but diarrhea persisted intermittently for two days.  He has a history of allergies to stinging insects and environmental allergens. He is currently taking Breo daily, uses a rescue inhaler as needed, and takes Singulair  (montelukast ) for allergies. He also occasionally takes an over-the-counter allergy  medication from  [employer] but did not take it on the day of the allergy  shot. He uses nasal sprays as needed.  He has a known allergy  to tree nuts and was advised to avoid peanuts and tree nuts. He consumed peanuts earlier in the week without a reaction. He is also on Ozempic  and phentermine  for weight management, with no recent changes in these medications.  He feels 'wrung out' with low energy, a sore stomach, and back pain from vomiting. He has been consuming yogurt, rice, chicken broth, dry toast, scrambled eggs, and chicken noodle soup to aid recovery. He has an EpiPen  at home that is not expired.     Assessment and Plan: George Montgomery is a 68 y.o. male with: Allergic reaction, initial encounter Acute anaphylactic reaction post-environmental allergy  shot. EpiPen  used, emergency care required. Denies eating any new foods. No stings.  May have been exacerbated by the fact that you forgot to take your allergy  medication on the day of injections and you mowed the grass afterwards. I question if you may have been stung by something as well.  Seasonal and perennial allergic rhinitis Past history - Perennial rhinoconjunctivitis symptoms for many years mainly in the spring and fall.  Tried Singulair , Zyrtec  and Flonase  with some benefit.  No prior ENT evaluation.  2023 CT sinus showed mild mucosal thickening. 2023 skin testing positive to dust mites, mold and cockroach. 2023 bloodwork positive to dust mites, grass, ragweed.  Borderline to cat, cockroach, tree, weed pollen. Interim history - started AIT on 02/14/2024 (G-W-RW-T & M-DM-C-CR). Allergic reaction to blue 0.5cc last week requiring epinephrine .  Continue environmental control measures. Continue Singulair  (  montelukast ) 10mg  daily at night. Use over the counter antihistamines such as Zyrtec  (cetirizine ), Claritin (loratadine), Allegra  (fexofenadine ), or Xyzal (levocetirizine) daily as needed. May take twice a day during allergy  flares. May switch antihistamines every few  months. Use Flonase  (fluticasone ) nasal spray 1-2 sprays per nostril once a day as needed for nasal congestion.  Nasal saline spray (i.e., Simply Saline) or nasal saline lavage (i.e., NeilMed) is recommended as needed and prior to medicated nasal sprays. Use azelastine  or ipratropium nasal spray 1-2 sprays per nostril twice a day as needed for runny nose/drainage. Stop allergy  injections.  Will re-assess about restarting in the winter months with silver vial with slow build up.   Hymenoptera allergy  Past history - Went to ER few times after stings due to hives and swelling. Never required Epi. 2023 bloodwork positive to honeybee, white faced hornet, yellow jacket, wasp, yellow hornet and bumblebee. Started VIT on 12/30/2022 (MV+HB+W). Interim history - no recent known stings.  Continue to avoid. Continue venom injections.   Moderate persistent asthma without complication Past history - Gets wheezing, coughing and shortness of breath mainly during URIs. Uses albuterol  rarely but during the last URI needed to add on Trelegy for 2 weeks. 2023 CXR unremarkable. 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 Clinically feeling slightly improved.  Interim history - concerned about flare in the fall. Today's spirometry showed some restriction. Daily controller medication(s): start Trelegy 200mcg 1 puff once a day and rinse mouth after each use.  Sample given. This replaces Breo. May use albuterol  rescue inhaler 2 puffs or nebulizer every 4 to 6 hours as needed for shortness of breath, chest tightness, coughing, and wheezing. May use albuterol  rescue inhaler 2 puffs 5 to 15 minutes prior to strenuous physical activities. Monitor frequency of use - if you need to use it more than twice per week on a consistent basis let us  know.  If not controlled consider adding biologics.   Anaphylactic reaction due to  food, subsequent encounter Past history - Upper body itching after eating mixed nuts in December 2023. Has eaten peanut butter since then with no issues. 2023 skin testing positive to [place] nuts. 2023 bloodwork positive to [place] nuts, borderline to walnuts, pecan and pistachio.  More likely to have anaphylactic reaction to [place] nuts, walnuts. Interim history - tolerating some peanut butter but with recent allergic reaction will avoid all peanuts and tree nuts for now.  Continue strict avoidance of tree nuts and peanuts. For mild symptoms you can take over the counter antihistamines (zyrtec  10mg  to 20mg ) and monitor symptoms closely.  If symptoms worsen or if you have severe symptoms including breathing issues, throat closure, significant swelling, whole body hives, severe diarrhea and vomiting, lightheadedness then use epinephrine  and seek immediate medical care afterwards. Emergency action plan in place.  Penicillin allergy  Consider penicillin allergy  skin testing and in office drug challenge in the future.   Recurrent infections Keep track of infections and antibiotics use.  Return in about 4 months (around 08/10/2024).  Meds ordered this encounter  Medications   Fluticasone -Umeclidin-Vilant (TRELEGY ELLIPTA) 200-62.5-25 MCG/ACT AEPB    Sig: Inhale 1 puff into the lungs daily. Rinse mouth after each use.    Dispense:  60 each    Refill:  5   albuterol  (VENTOLIN  HFA) 108 (90 Base) MCG/ACT inhaler    Sig: Inhale 2 puffs into the lungs every 4 (four) hours as needed for wheezing  or shortness of breath (coughing fits).    Dispense:  18 g    Refill:  1   Lab Orders  No laboratory test(s) ordered today    Diagnostics: Spirometry:  Tracings reviewed. His effort: It was hard to get consistent efforts and there is a question as to whether this reflects a maximal maneuver. FVC: 2.53L FEV1: 1.92L, 61% predicted FEV1/FVC ratio: 76% Interpretation: Spirometry consistent with possible  restrictive disease.  Please see scanned spirometry results for details.  Results discussed with patient/family.   Medication List:  Current Outpatient Medications  Medication Sig Dispense Refill   EPINEPHrine  (EPIPEN  2-PAK) 0.3 mg/0.3 mL IJ SOAJ injection Inject 0.3 mg into the muscle as needed for anaphylaxis. 2 each 2   escitalopram  (LEXAPRO ) 10 MG tablet Take  1 tablet  Daily for Mood                                                              /                                                                   TAKE                                         BY                                                 MOUTH 90 tablet 3   fenofibrate  micronized (LOFIBRA) 134 MG capsule Take 1 capsule Daily for Triglycerides ( Blood Fats)                                                 /                                                                   TAKE                                         BY                                                 MOUTH  ONCE ? ? ?  DAILY 90 capsule 3   fluticasone  (FLONASE ) 50 MCG/ACT nasal spray Place 1 spray into both nostrils 2 (two) times daily as needed (nasal congestion). 16 g 5   Fluticasone -Umeclidin-Vilant (TRELEGY ELLIPTA) 200-62.5-25 MCG/ACT AEPB Inhale 1 puff into the lungs daily. Rinse mouth after each use. 60 each 5   ipratropium (ATROVENT ) 0.06 % nasal spray Use 1 to 2 sprays each nostril 2 to 3 x /day as needed 45 mL 3   meloxicam  (MOBIC ) 15 MG tablet Take 1/2 to 1 tablet Daily with Food for Pain & Inflammation                                                                   /                                   TAKE                                 BY                                    MOUTH                               ONCE  ?  ?  ?  DAILY 90 tablet 3   montelukast  (SINGULAIR ) 10 MG tablet Take  1 tablet  Daily for Allergies                               /                                                                   TAKE                                          BY                                                 MOUTH ONCE ? ? ?  DAILY 90 tablet 3   omeprazole  (PRILOSEC) 20 MG capsule Take  1 capsule  Daily to Prevent Heartburn & Indigestion                                   /  TAKE                                         BY                                                 MOUTH 90 capsule 3   ondansetron  (ZOFRAN ) 4 MG tablet Take 1 tablet (4 mg total) by mouth daily as needed for nausea or vomiting. 30 tablet 1   phentermine  (ADIPEX-P ) 37.5 MG tablet TAKE 1 TABLET EVERY MORNING FOR DIETING & WEIGHT LOSS 30 tablet 0   Semaglutide , 2 MG/DOSE, 8 MG/3ML SOPN Inject 2 mg as directed once a week. 3 mL 2   sildenafil  (VIAGRA ) 100 MG tablet Take   1/2 to 1 tablet   Daily  as needed for XXXX 30 tablet 1   tamsulosin  (FLOMAX ) 0.4 MG CAPS capsule TAKE 1 CAPSULE BY MOUTH AT BEDTIME FOR PROSTATE 90 capsule 0   valsartan -hydrochlorothiazide  (DIOVAN -HCT) 80-12.5 MG tablet Take 1 tablet by mouth daily. for blood pressure 90 tablet 3   Zinc 50 MG TABS Take by mouth.     albuterol  (VENTOLIN  HFA) 108 (90 Base) MCG/ACT inhaler Inhale 2 puffs into the lungs every 4 (four) hours as needed for wheezing or shortness of breath (coughing fits). 18 g 1   ketorolac  (TORADOL ) 10 MG tablet Take 10 mg by mouth every 6 (six) hours. (Patient not taking: Reported on 04/10/2024)     oxyCODONE -acetaminophen  (PERCOCET) 10-325 MG tablet Take 1 tablet by mouth every 6 (six) hours as needed. (Patient not taking: Reported on 04/10/2024)     pregabalin (LYRICA) 50 MG capsule Take 50 mg by mouth 2 (two) times daily. (Patient not taking: Reported on 04/10/2024)     promethazine -dextromethorphan (PROMETHAZINE -DM) 6.25-15 MG/5ML syrup Take 5 mLs by mouth 4 (four) times daily as needed for cough. (Patient taking differently: Take 5 mLs by mouth as needed for cough.) 240 mL 1   SODIUM FLUORIDE 5000 PPM 1.1 % GEL dental gel   (Patient not taking: Reported on 04/10/2024)     tiZANidine  (ZANAFLEX ) 2 MG tablet Take 2 mg by mouth 2 (two) times daily. (Patient not taking: Reported on 04/10/2024)     Current Facility-Administered Medications  Medication Dose Route Frequency Provider Last Rate Last Admin   ipratropium-albuterol  (DUONEB) 0.5-2.5 (3) MG/3ML nebulizer solution 3 mL  3 mL Nebulization Once        Allergies: Allergies  Allergen Reactions   Bee Venom Anaphylaxis   Pine Anaphylaxis   Peanut-Containing Drug Products Itching and Other (See Comments)   Estonia Nut (Berthollefia Excelsa)     pruritus   Gabapentin  Other (See Comments)   Lisinopril  Other (See Comments)    Patient intolerant of drug which caused his tongue to tingle.   Penicillins Itching   Celebrex  [Celecoxib ]    I reviewed his past medical history, social history, family history, and environmental history and no significant changes have been reported from his previous visit.  Review of Systems  Constitutional:  Positive for fatigue. Negative for appetite change, chills, fever and unexpected weight change.  HENT:  Negative for congestion and rhinorrhea.   Eyes:  Negative for itching.  Respiratory:  Negative for cough, chest tightness, shortness of breath and  wheezing.   Cardiovascular:  Negative for chest pain.  Gastrointestinal:  Negative for abdominal pain.  Genitourinary:  Negative for difficulty urinating.  Skin:  Negative for rash.  Allergic/Immunologic: Positive for environmental allergies and food allergies.  Neurological:  Negative for headaches.    Objective: BP 110/60 (BP Location: Right Arm, Patient Position: Sitting, Cuff Size: Large)   Pulse 74   Temp 98.2 F (36.8 C) (Temporal)   Resp 18   Ht 5' 8.9 (1.75 m)   Wt 234 lb 1.6 oz (106.2 kg)   SpO2 94%   BMI 34.67 kg/m  Body mass index is 34.67 kg/m. Physical Exam Vitals and nursing note reviewed.  Constitutional:      Appearance: Normal appearance. He is  well-developed. He is obese.  HENT:     Head: Normocephalic and atraumatic.     Right Ear: Tympanic membrane and external ear normal.     Left Ear: Tympanic membrane and external ear normal.     Nose: Nose normal.     Mouth/Throat:     Mouth: Mucous membranes are moist.     Pharynx: Oropharynx is clear.   Eyes:     Conjunctiva/sclera: Conjunctivae normal.    Cardiovascular:     Rate and Rhythm: Normal rate and regular rhythm.     Heart sounds: Normal heart sounds. No murmur heard. Pulmonary:     Effort: Pulmonary effort is normal.     Breath sounds: Normal breath sounds. No wheezing, rhonchi or rales.   Musculoskeletal:     Cervical back: Neck supple.   Skin:    General: Skin is warm.     Findings: No rash.   Neurological:     Mental Status: He is alert and oriented to person, place, and time.   Psychiatric:        Behavior: Behavior normal.    Previous notes and tests were reviewed. The plan was reviewed with the patient/family, and all questions/concerned were addressed.  It was my pleasure to see Dionicio today and participate in his care. Please feel free to contact me with any questions or concerns.  Sincerely,  Orlan Cramp, DO Allergy  & Immunology  [facility]  [place] office: [phone] [facility] office: [phone]

## 2024-04-18 ENCOUNTER — Ambulatory Visit (INDEPENDENT_AMBULATORY_CARE_PROVIDER_SITE_OTHER)

## 2024-04-18 DIAGNOSIS — Z91038 Other insect allergy status: Secondary | ICD-10-CM | POA: Diagnosis not present

## 2024-04-18 NOTE — Telephone Encounter (Signed)
 Please call patient back.  And clarify if he had red meat around the same time with both reactions.  In the past had positive alpha gal testing on bloodwork but then in 2024 he told me that he was eating red meat without any issues.   Now I'm concerned if it's the mammalian meat that is giving him problems.   Start strict avoidance of mammalian meat.

## 2024-04-18 NOTE — Telephone Encounter (Signed)
 Patient came in for his venom injections and informed me that he had another reaction on Sunday with vomiting, diarrhea, eye redness, pouring running nose, sneezing, and feeling like he was getting sick. Patient stated he was out in the environment on Saturday and ate steak. He said by Monday is was fine. He has started taking his Allegra  daily. Patient is wondering if he should start back on his injections as he feels the reactions are not due to his allergy  injections. He said when he had the first reaction he went home after getting his injection and mowed the yard. He now knows not to do that after getting his allergy  injections. Please advise.

## 2024-04-19 NOTE — Telephone Encounter (Signed)
 I called the patient in regards to his reactions and left a message for him to call the [place] office back.

## 2024-04-24 NOTE — Telephone Encounter (Signed)
 I called the patient and he did have red meat exposure both times he had a reaction. He verbalized understanding that his is to avoid all red meat products at this time. He has no further questions or concerns.

## 2024-04-25 ENCOUNTER — Ambulatory Visit: Admitting: Plastic Surgery

## 2024-04-25 VITALS — BP 133/70 | HR 80

## 2024-04-25 DIAGNOSIS — L719 Rosacea, unspecified: Secondary | ICD-10-CM

## 2024-04-25 DIAGNOSIS — L711 Rhinophyma: Secondary | ICD-10-CM

## 2024-04-25 NOTE — Progress Notes (Signed)
 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  He is very pleased with his progress.  I think we can do a little bit better.  The patient agrees.  Will plan for a few more BBL's and then another TRL in September.

## 2024-04-25 NOTE — Progress Notes (Signed)
 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  I think he would be better off with another BBL and then another TRL.  The patient is in agreement.  Pictures were obtained of the patient and placed in the chart with the patient's or guardian's permission.

## 2024-04-28 ENCOUNTER — Inpatient Hospital Stay: Admitting: Family Medicine

## 2024-05-09 ENCOUNTER — Other Ambulatory Visit: Payer: Self-pay | Admitting: Nurse Practitioner

## 2024-05-09 DIAGNOSIS — E1122 Type 2 diabetes mellitus with diabetic chronic kidney disease: Secondary | ICD-10-CM

## 2024-05-10 ENCOUNTER — Ambulatory Visit

## 2024-05-12 ENCOUNTER — Other Ambulatory Visit: Payer: Self-pay

## 2024-05-12 ENCOUNTER — Emergency Department (HOSPITAL_COMMUNITY)
Admission: EM | Admit: 2024-05-12 | Discharge: 2024-05-13 | Disposition: A | Discharge status: Home / Self Care | Attending: Emergency Medicine | Admitting: Emergency Medicine

## 2024-05-12 DIAGNOSIS — Z7951 Long term (current) use of inhaled steroids: Secondary | ICD-10-CM | POA: Diagnosis not present

## 2024-05-12 DIAGNOSIS — Z794 Long term (current) use of insulin: Secondary | ICD-10-CM | POA: Insufficient documentation

## 2024-05-12 DIAGNOSIS — T7840XA Allergy, unspecified, initial encounter: Secondary | ICD-10-CM | POA: Diagnosis not present

## 2024-05-12 DIAGNOSIS — J45909 Unspecified asthma, uncomplicated: Secondary | ICD-10-CM | POA: Insufficient documentation

## 2024-05-12 DIAGNOSIS — I10 Essential (primary) hypertension: Secondary | ICD-10-CM | POA: Insufficient documentation

## 2024-05-12 DIAGNOSIS — Z91010 Allergy to peanuts: Secondary | ICD-10-CM | POA: Insufficient documentation

## 2024-05-12 DIAGNOSIS — R112 Nausea with vomiting, unspecified: Secondary | ICD-10-CM | POA: Diagnosis present

## 2024-05-12 DIAGNOSIS — Z79899 Other long term (current) drug therapy: Secondary | ICD-10-CM | POA: Insufficient documentation

## 2024-05-12 MED ORDER — DIPHENHYDRAMINE HCL 50 MG/ML IJ SOLN
25.0000 mg | Freq: Once | INTRAMUSCULAR | Status: AC
  Administered 2024-05-13: 25 mg via INTRAVENOUS
  Filled 2024-05-12: qty 1

## 2024-05-12 MED ORDER — FAMOTIDINE IN NACL 20-0.9 MG/50ML-% IV SOLN
20.0000 mg | Freq: Once | INTRAVENOUS | Status: AC
  Administered 2024-05-13: 20 mg via INTRAVENOUS
  Filled 2024-05-12: qty 50

## 2024-05-12 MED ORDER — METHYLPREDNISOLONE SODIUM SUCC 125 MG IJ SOLR
125.0000 mg | Freq: Once | INTRAMUSCULAR | Status: AC
  Administered 2024-05-13: 125 mg via INTRAVENOUS
  Filled 2024-05-12: qty 2

## 2024-05-12 NOTE — ED Provider Notes (Signed)
 [place] EMERGENCY DEPARTMENT AT [facility] Provider Note   CSN: [id number] Arrival date & time: 05/12/24  2339     Patient presents with: No chief complaint on file.   George Montgomery is a 68 y.o. male.   The history is provided by the patient.  He has history of hypertension, asthma, GERD, allergies and comes in by ambulance with a probable allergic reaction.  He states that he had picked up a log this morning and had several bites on his right arm and chest but did not have any other immediate reactions to it.  He had put some cream on it and those bites have resolved.  Tonight, he developed nausea and vomiting which is typically what he has with his allergic reactions.  He states he is having some slight difficulty swallowing now.  EMS gave ondansetron  for vomiting and epinephrine  for presumed anaphylaxis.  He states he is feeling much better now although still has some slight difficulty swallowing.  He does state that he had eaten some cake and wonders if there might have been something in the cake that he had reacted to.     Prior to Admission medications   Medication Sig Start Date End Date Taking? Authorizing Provider  albuterol  (VENTOLIN  HFA) 108 (90 Base) MCG/ACT inhaler Inhale 2 puffs into the lungs every 4 (four) hours as needed for wheezing or shortness of breath (coughing fits). 04/10/24   Luke Orlan HERO, DO  EPINEPHrine  (EPIPEN  2-PAK) 0.3 mg/0.3 mL IJ SOAJ injection Inject 0.3 mg into the muscle as needed for anaphylaxis. 02/24/22   Luke Orlan HERO, DO  escitalopram  (LEXAPRO ) 10 MG tablet Take  1 tablet  Daily for Mood                                                              /                                                                   TAKE                                         BY                                                 MOUTH 02/21/24   Webb, Padonda B, FNP  fenofibrate  micronized (LOFIBRA) 134 MG capsule Take 1 capsule Daily for Triglycerides ( Blood  Fats)                                                 /  TAKE                                         BY                                                 MOUTH                     ONCE ? ? ?  DAILY 04/06/24   Lendia Nordmann L, NP-C  fluticasone  (FLONASE ) 50 MCG/ACT nasal spray Place 1 spray into both nostrils 2 (two) times daily as needed (nasal congestion). 09/28/22   Luke Orlan HERO, DO  Fluticasone -Umeclidin-Vilant (TRELEGY ELLIPTA ) 200-62.5-25 MCG/ACT AEPB Inhale 1 puff into the lungs daily. Rinse mouth after each use. 04/10/24   Luke Orlan HERO, DO  ipratropium (ATROVENT ) 0.06 % nasal spray Use 1 to 2 sprays each nostril 2 to 3 x /day as needed 11/30/22   Tonita Fallow, MD  ketorolac  (TORADOL ) 10 MG tablet Take 10 mg by mouth every 6 (six) hours. Patient not taking: Reported on 04/25/2024 02/22/24   [provider]  meloxicam  (MOBIC ) 15 MG tablet Take 1/2 to 1 tablet Daily with Food for Pain & Inflammation                                                                   /                                   TAKE                                 BY                                    MOUTH                               ONCE  ?  ?  ?  DAILY 05/13/23   Tonita Fallow, MD  montelukast  (SINGULAIR ) 10 MG tablet Take  1 tablet  Daily for Allergies                               /                                                                   TAKE  BY                                                 MOUTH ONCE ? ? ?  DAILY 09/05/23   Tonita Fallow, MD  omeprazole  (PRILOSEC) 20 MG capsule Take  1 capsule  Daily to Prevent Heartburn & Indigestion                                   /                                                                   TAKE                                         BY                                                 MOUTH 04/06/24   Henson, Vickie L, NP-C  ondansetron  (ZOFRAN ) 4 MG tablet  Take 1 tablet (4 mg total) by mouth daily as needed for nausea or vomiting. 10/25/23 10/24/24  Wilkinson, Dana E, NP  oxyCODONE -acetaminophen  (PERCOCET) 10-325 MG tablet Take 1 tablet by mouth every 6 (six) hours as needed. Patient not taking: Reported on 04/25/2024    [provider]  phentermine  (ADIPEX-P ) 37.5 MG tablet TAKE 1 TABLET EVERY MORNING FOR DIETING & WEIGHT LOSS 02/21/24   Webb, Padonda B, FNP  pregabalin (LYRICA) 50 MG capsule Take 50 mg by mouth 2 (two) times daily. Patient not taking: Reported on 04/25/2024    [provider]  promethazine -dextromethorphan (PROMETHAZINE -DM) 6.25-15 MG/5ML syrup Take 5 mLs by mouth 4 (four) times daily as needed for cough. Patient taking differently: Take 5 mLs by mouth as needed for cough. 07/28/23   Wilkinson, Dana E, NP  Semaglutide , 2 MG/DOSE, 8 MG/3ML SOPN Inject 2 mg as directed once a week. 04/07/24   Henson, Vickie L, NP-C  sildenafil  (VIAGRA ) 100 MG tablet Take   1/2 to 1 tablet   Daily  as needed for XXXX 11/17/22   Tonita Fallow, MD  SODIUM FLUORIDE 5000 PPM 1.1 % GEL dental gel  09/08/23   [provider]  tamsulosin  (FLOMAX ) 0.4 MG CAPS capsule TAKE 1 CAPSULE BY MOUTH AT BEDTIME FOR PROSTATE 02/21/24   Webb, Padonda B, FNP  tiZANidine  (ZANAFLEX ) 2 MG tablet Take 2 mg by mouth 2 (two) times daily. Patient not taking: Reported on 04/25/2024 01/26/24   [provider]  valsartan -hydrochlorothiazide  (DIOVAN -HCT) 80-12.5 MG tablet Take 1 tablet by mouth daily. for blood pressure 04/06/24   Henson, Vickie L, NP-C  Zinc 50 MG TABS Take by mouth.    [provider]    Allergies: Bee venom, Pine, Peanut-containing drug products, [place] nut (berthollefia excelsa), Gabapentin , Lisinopril , Penicillins, and Celebrex  [celecoxib ]    Review  of Systems  All other systems reviewed and are negative.   Updated Vital Signs BP (!) 153/84 (BP Location: Left Arm) Comment: Simultaneous filing. User may not have seen previous  data.  Pulse 85 Comment: Simultaneous filing. User may not have seen previous data.  Temp 98 F (36.7 C) (Oral)   Resp 15 Comment: Simultaneous filing. User may not have seen previous data.  SpO2 95% Comment: Simultaneous filing. User may not have seen previous data.  Physical Exam Vitals and nursing note reviewed.   68 year old male, resting comfortably and in no acute distress. Vital signs are significant for elevated blood pressure. Oxygen  saturation is 95%, which is normal. Head is normocephalic and atraumatic. PERRLA, EOMI. Oropharynx shows mild to moderate edema of the uvula and soft tissues of the pharynx.  Phonation is normal and there is no pooling of secretions.  There is no stridor. Neck is nontender and supple without adenopathy. Lungs are clear without rales, wheezes, or rhonchi. Chest is nontender. Heart has regular rate and rhythm without murmur. Abdomen is soft, flat, nontender.  Small umbilical hernia is present and easily reducible. Extremities have 1+ edema, full range of motion is present. Skin is warm and dry without rash. Neurologic: Awake and alert and oriented, cranial nerves are intact, moves all extremities equally.  (all labs ordered are listed, but only abnormal results are displayed) Labs Reviewed - No data to display  EKG: None  Radiology: No results found.   Procedures  Cardiac monitor showed normal sinus rhythm, per my interpretation. Medications Ordered in the ED  menthol -cetylpyridinium (CEPACOL) lozenge 3 mg (3 mg Oral Given 05/13/24 0138)  diphenhydrAMINE  (BENADRYL ) injection 25 mg (25 mg Intravenous Given 05/13/24 0000)  methylPREDNISolone  sodium succinate (SOLU-MEDROL ) 125 mg/2 mL injection 125 mg (125 mg Intravenous Given 05/13/24 0000)  famotidine  (PEPCID ) IVPB 20 mg premix (0 mg Intravenous Stopped 05/13/24 0136)  EPINEPHrine  (ADRENALIN ) 0.3 mg (0.3 mg Subcutaneous Given 05/13/24 0034)  prochlorperazine  (COMPAZINE ) injection 10 mg (10 mg  Intravenous Given 05/13/24 0224)  metoCLOPramide  (REGLAN ) injection 10 mg (10 mg Intravenous Given 05/13/24 0316)  diphenhydrAMINE  (BENADRYL ) injection 25 mg (25 mg Intravenous Given 05/13/24 0320)                                    Medical Decision Making Risk OTC drugs. Prescription drug management.   Vomiting eating and pharyngeal swelling which may indeed be part of a syndrome related to an allergic reaction.  Currently, he shows no signs of urticaria or other rash.  I have ordered methylprednisolone , diphenhydramine , and famotidine  and he will need to be observed.  I have reviewed his past records, and note ED visit on 04/07/2024 for anaphylaxis.  12:23 AM Patient states that he is having some difficulty breathing and feels like his swallowing is worse.  On exam, I noticed no change.  He still has no stridor and still has normal phonation and no change in the swelling in his pharynx.  However, based on his subjective change, I have ordered a dose of epinephrine .  Following above-noted treatment, patient was resting quietly through the night.  He did have recurrence of vomiting and I ordered a dose of prochlorperazine  following which she had no further vomiting.  He thinks that he was gagging on his enlarged uvula.  I reevaluated him and his oral swelling seem to have decreased and he stated that he was feeling better.  I have discharged him with a prescription for prednisone , prochlorperazine , and a new prescription for epinephrine  autoinjector.  I recommended follow-up with his primary care provider, return precautions discussed.  CRITICAL CARE Performed by: Alm Lias Total critical care time: 60 minutes Critical care time was exclusive of separately billable procedures and treating other patients. Critical care was necessary to treat or prevent imminent or life-threatening deterioration. Critical care was time spent personally by me on the following activities: development of treatment  plan with patient and/or surrogate as well as nursing, discussions with consultants, evaluation of patient's response to treatment, examination of patient, obtaining history from patient or surrogate, ordering and performing treatments and interventions, ordering and review of laboratory studies, ordering and review of radiographic studies, pulse oximetry and re-evaluation of patient's condition.     Final diagnoses:  Acute allergic reaction, initial encounter  Nausea and vomiting, unspecified vomiting type    ED Discharge Orders          Ordered    predniSONE  (DELTASONE ) 50 MG tablet  Daily        05/13/24 0639    prochlorperazine  (COMPAZINE ) 10 MG tablet  Every 6 hours PRN        05/13/24 0639    fenofibrate  micronized (LOFIBRA) 134 MG capsule        05/13/24 0640               Lias Alm, MD 05/13/24 [phone]

## 2024-05-12 NOTE — ED Triage Notes (Signed)
 Pt came in via EMS from home w/ c/o possible allergic reaction.Pt was nauseous, vomiting, and experiencing throat tightening. Reports bug bites earlier today and experienced itchiness.   EMS 4mg  zofran  0.3 EPI IM

## 2024-05-13 MED ORDER — PROCHLORPERAZINE MALEATE 10 MG PO TABS: 10.0000 mg | ORAL_TABLET | Freq: Four times a day (QID) | ORAL | 0 refills | Status: DC | PRN

## 2024-05-13 MED ORDER — PROCHLORPERAZINE EDISYLATE 10 MG/2ML IJ SOLN
10.0000 mg | Freq: Once | INTRAMUSCULAR | Status: AC
  Administered 2024-05-13: 10 mg via INTRAVENOUS
  Filled 2024-05-13: qty 2

## 2024-05-13 MED ORDER — EPINEPHRINE PF 1 MG/ML IJ SOLN
0.3000 mg | Freq: Once | INTRAMUSCULAR | Status: AC
  Administered 2024-05-13: 0.3 mg via SUBCUTANEOUS
  Filled 2024-05-13: qty 1

## 2024-05-13 MED ORDER — PREDNISONE 50 MG PO TABS: 50.0000 mg | ORAL_TABLET | Freq: Every day | ORAL | 0 refills | Status: DC

## 2024-05-13 MED ORDER — METOCLOPRAMIDE HCL 5 MG/ML IJ SOLN
10.0000 mg | Freq: Once | INTRAMUSCULAR | Status: AC
  Administered 2024-05-13: 10 mg via INTRAVENOUS
  Filled 2024-05-13: qty 2

## 2024-05-13 MED ORDER — MENTHOL 3 MG MT LOZG
1.0000 | LOZENGE | OROMUCOSAL | Status: DC | PRN
  Administered 2024-05-13: 3 mg via ORAL
  Filled 2024-05-13: qty 9

## 2024-05-13 MED ORDER — DIPHENHYDRAMINE HCL 50 MG/ML IJ SOLN
25.0000 mg | Freq: Once | INTRAMUSCULAR | Status: AC
  Administered 2024-05-13: 25 mg via INTRAVENOUS
  Filled 2024-05-13: qty 1

## 2024-05-13 MED ORDER — EPINEPHRINE 0.3 MG/0.3ML IJ SOAJ: 0.3000 mg | INTRAMUSCULAR | 2 refills | Status: AC | PRN

## 2024-05-13 MED ORDER — FENOFIBRATE MICRONIZED 134 MG PO CAPS: ORAL_CAPSULE | ORAL | 3 refills | Status: DC

## 2024-05-13 NOTE — Discharge Instructions (Addendum)
 Return to the emergency department if you have any new or concerning symptoms.  Please remember that if you ever have to use the epinephrine  autoinjector, you need to come to the emergency department as soon as possible following that.  The epinephrine  does not cure the allergy , it only stabilizes your condition to give you an opportunity to get to the emergency department.

## 2024-05-15 ENCOUNTER — Other Ambulatory Visit: Admitting: Surgical

## 2024-05-16 ENCOUNTER — Encounter

## 2024-05-16 ENCOUNTER — Ambulatory Visit (INDEPENDENT_AMBULATORY_CARE_PROVIDER_SITE_OTHER)

## 2024-05-16 ENCOUNTER — Ambulatory Visit (INDEPENDENT_AMBULATORY_CARE_PROVIDER_SITE_OTHER): Admitting: Surgical

## 2024-05-16 DIAGNOSIS — Z91038 Other insect allergy status: Secondary | ICD-10-CM

## 2024-05-16 DIAGNOSIS — L719 Rosacea, unspecified: Secondary | ICD-10-CM

## 2024-05-16 DIAGNOSIS — L711 Rhinophyma: Secondary | ICD-10-CM

## 2024-05-16 NOTE — Progress Notes (Signed)
 Preoperative Dx: facial vessels  Postoperative Dx:  same  Procedure: laser to nose   Anesthesia: none  Description of Procedure:  Risks and complications were explained to the patient. Consent was confirmed and signed. Time out was called and all information was confirmed to be correct. The area  area was prepped with alcohol and wiped dry. The 560 nm laser was used. The patient tolerated the procedure well and there were no complications. The patient is to follow up in 4 weeks.  Pictures were obtained of the patient and placed in the chart with the patient's or guardian's permission.

## 2024-05-17 ENCOUNTER — Encounter: Payer: Medicare Other | Admitting: Internal Medicine

## 2024-05-22 ENCOUNTER — Encounter: Payer: Medicare Other | Admitting: Internal Medicine

## 2024-05-22 ENCOUNTER — Ambulatory Visit: Admitting: Allergy

## 2024-05-30 NOTE — Progress Notes (Signed)
 Follow Up Note  RE: George Montgomery MRN: [id number] DOB: 1956-10-09 Date of Office Visit: 05/31/2024  Referring provider: Lendia Boby LITTIE, NP-C Primary care provider: Lendia Boby LITTIE, NP-C  Chief Complaint: Allergic Reaction (3 weeks ago went to the hospital after working in the yard and was bitten by a spider. Constant sweating, he used triamcinolone cream on neck bite, and he also ate a hot dog the night before. He had severe diarrhea and vomiting and called 911. )  History of Present Illness: I had the pleasure of seeing George Montgomery for a follow [facility]  on [facility]/31/[facility]24. He is a 68 y.o. male, who is being followed for allergic reactions, allergic rhinitis, hymenoptera allergy  on VIT, asthma, food allergy , penicillin allergy  and recurrent infections. His previous allergy  office visit was on 03/31/2024 with Dr. Luke. Today is a regular follow up visit.  Discussed the use of AI scribe software for clinical note transcription with the patient, who gave verbal consent to proceed.    A few weeks ago, he experienced a severe allergic reaction after consuming beef hot dogs, which he initially thought were pork. The following day, after mowing the lawn and handling a large limb, he believes he was bitten by an insect, possibly a spider or fire  ants, resulting in multiple bites on his neck and side. He applied triamcinolone cream to the itchy bites but did not use an EpiPen . Later that evening, he experienced profuse sweating, vomiting, and diarrhea, prompting him to call 911. Emergency services administered two EpiPen  shots, which alleviated his symptoms.  He continues to take Trelegy, one puff once a day, and Zyrtec  for allergies. He has not had any known cross-contamination with peanuts or tree nuts and is receiving venom shots, which have been going well. He has not had any recent tick bites but spends considerable time outdoors, engaging in activities like  mowing, splitting wood, and hunting.  He mentions consuming bacon occasionally, including two pieces on the morning of the visit.     05/12/2024 ER visit: He has history of hypertension, asthma, GERD, allergies and comes in by ambulance with a probable allergic reaction. He states that he had picked up a log this morning and had several bites on his right arm and chest but did not have any other immediate reactions to it. He had put some cream on it and those bites have resolved. Tonight, he developed nausea and vomiting which is typically what he has with his allergic reactions. He states he is having some slight difficulty swallowing now. EMS gave ondansetron  for vomiting and epinephrine  for presumed anaphylaxis. He states he is feeling much better now although still has some slight difficulty swallowing. He does state that he had eaten some cake and wonders if there might have been something in the cake that he had reacted to.  Assessment and Plan: George Montgomery is a 68 y.o. male with: Allergic reaction, subsequent encounter Recent anaphylactic reaction requiring IM epi x 2 of uncertain cause; possible alpha-gal syndrome or insect/ant bite. For mild symptoms you can take over the counter antihistamines (zyrtec  10mg  to 20mg ) and monitor symptoms closely.  If symptoms worsen or if you have severe symptoms including breathing issues, throat closure, significant swelling, whole body hives, severe diarrhea and vomiting, lightheadedness then use epinephrine  and seek immediate medical care afterwards. Emergency action in place.  Avoid mammalian meat - no beef, pork, lamb.  I'm also checking for fire  ant allergy .  Get bloodwork.  Anaphylactic reaction due to food, subsequent encounter Past history - Upper body itching after eating mixed nuts in December 2023. Has eaten peanut  butter since then with no issues. 2023 skin testing positive to [place] nuts. 2023 bloodwork positive to [place] nuts, borderline to  walnuts, pecan and pistachio.  More likely to have anaphylactic reaction to [place] nuts, walnuts. Continue strict avoidance of tree nuts and peanuts. For mild symptoms you can take over the counter antihistamines (zyrtec  10mg  to 20mg ) and monitor symptoms closely.  If symptoms worsen or if you have severe symptoms including breathing issues, throat closure, significant swelling, whole body hives, severe diarrhea and vomiting, lightheadedness then use epinephrine  and seek immediate medical care afterwards. Emergency action plan in place.  Hymenoptera allergy  Past history - Went to ER few times after stings due to hives and swelling. Never required Epi. 2023 bloodwork positive to honeybee, white faced hornet, yellow jacket, wasp, yellow hornet and bumblebee. Started VIT on 12/30/2022 (MV+HB+W). Continue to avoid. Continue venom injections.   Moderate persistent asthma without complication Past history - Gets wheezing, coughing and shortness of breath mainly during URIs. Uses albuterol  rarely but during the last URI needed to add on Trelegy for 2 weeks. 2023 CXR unremarkable. 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 Clinically feeling slightly improved.  Daily controller medication(s): Trelegy 200mcg 1 puff once a day and rinse mouth after each use.  May use albuterol  rescue inhaler 2 puffs or nebulizer every 4 to 6 hours as needed for shortness of breath, chest tightness, coughing, and wheezing. May use albuterol  rescue inhaler 2 puffs 5 to 15 minutes prior to strenuous physical activities. Monitor frequency of use - if you need to use it more than twice per week on a consistent basis let us  know.   Seasonal and perennial allergic rhinitis Past history - Perennial rhinoconjunctivitis symptoms for many years mainly in the spring and fall.  Tried Singulair , Zyrtec  and Flonase  with some benefit.  No prior  ENT evaluation.  2023 CT sinus showed mild mucosal thickening. 2023 skin testing positive to dust mites, mold and cockroach. 2023 bloodwork positive to dust mites, grass, ragweed.  Borderline to cat, cockroach, tree, weed pollen. Started AIT on 02/14/2024 (G-W-RW-T & M-DM-C-CR). Allergic reaction to blue 0.5cc last week requiring epinephrine .  Interim history - symptomatic.  Continue environmental control measures. Continue Singulair  (montelukast ) 10mg  daily at night. Use over the counter antihistamines such as Zyrtec  (cetirizine ), Claritin (loratadine), Allegra  (fexofenadine ), or Xyzal (levocetirizine) daily as needed. May take twice a day during allergy  flares. May switch antihistamines every few months. Use Flonase  (fluticasone ) nasal spray 1-2 sprays per nostril once a day as needed for nasal congestion.  Nasal saline spray (i.e., Simply Saline) or nasal saline lavage (i.e., NeilMed) is recommended as needed and prior to medicated nasal sprays. Use azelastine  or ipratropium nasal spray 1-2 sprays per nostril twice a day as needed for runny nose/drainage. Allergy  injections.  Will re-assess about restarting in the winter months.   Penicillin allergy  Consider penicillin allergy  skin testing and in office drug challenge in the future.   Recurrent infections Keep track of infections and antibiotics use.   Return in about 2 months (around 07/31/2024).  No orders of the defined types were placed in this encounter.  Lab Orders         Alpha-Gal Panel         Allergen Fire  Ant  Tryptase         IgE Nut Prof. w/Component Rflx         IgE      Diagnostics: None.    Medication List:  Current Outpatient Medications  Medication Sig Dispense Refill   albuterol  (VENTOLIN  HFA) 108 (90 Base) MCG/ACT inhaler Inhale 2 puffs into the lungs every 4 (four) hours as needed for wheezing or shortness of breath (coughing fits). 18 g 1   EPINEPHrine  (EPIPEN  2-PAK) 0.3 mg/0.3 mL IJ SOAJ injection  Inject 0.3 mg into the muscle as needed for anaphylaxis. 8 each 2   escitalopram  (LEXAPRO ) 10 MG tablet Take  1 tablet  Daily for Mood                                                              /                                                                   TAKE                                         BY                                                 MOUTH 90 tablet 3   fluticasone  (FLONASE ) 50 MCG/ACT nasal spray Place 1 spray into both nostrils 2 (two) times daily as needed (nasal congestion). 16 g 5   Fluticasone -Umeclidin-Vilant (TRELEGY ELLIPTA ) 200-62.5-25 MCG/ACT AEPB Inhale 1 puff into the lungs daily. Rinse mouth after each use. 60 each 5   ipratropium (ATROVENT ) 0.06 % nasal spray Use 1 to 2 sprays each nostril 2 to 3 x /day as needed 45 mL 3   ketorolac  (TORADOL ) 10 MG tablet Take 10 mg by mouth every 6 (six) hours. (Patient not taking: Reported on 04/25/2024)     meloxicam  (MOBIC ) 15 MG tablet Take 1/2 to 1 tablet Daily with Food for Pain & Inflammation                                                                   /                                   TAKE                                 BY  MOUTH                               ONCE  ?  ?  ?  DAILY 90 tablet 3   montelukast  (SINGULAIR ) 10 MG tablet Take  1 tablet  Daily for Allergies                               /                                                                   TAKE                                         BY                                                 MOUTH ONCE ? ? ?  DAILY 90 tablet 3   omeprazole  (PRILOSEC) 20 MG capsule Take  1 capsule  Daily to Prevent Heartburn & Indigestion                                   /                                                                   TAKE                                         BY                                                 MOUTH 90 capsule 3   ondansetron  (ZOFRAN ) 4 MG tablet Take 1 tablet (4 mg total) by mouth daily as needed for  nausea or vomiting. 30 tablet 1   oxyCODONE -acetaminophen  (PERCOCET) 10-325 MG tablet Take 1 tablet by mouth every 6 (six) hours as needed. (Patient not taking: Reported on 04/25/2024)     phentermine  (ADIPEX-P ) 37.5 MG tablet TAKE 1 TABLET EVERY MORNING FOR DIETING & WEIGHT LOSS 30 tablet 0   predniSONE  (DELTASONE ) 50 MG tablet Take 1 tablet (50 mg total) by mouth daily. 5 tablet 0   pregabalin (LYRICA) 50 MG capsule Take 50 mg by mouth 2 (two) times daily. (Patient not taking: Reported on 04/25/2024)     prochlorperazine  (COMPAZINE ) 10 MG tablet Take 1 tablet (10 mg total) by mouth every 6 (  six) hours as needed for nausea or vomiting. 20 tablet 0   promethazine -dextromethorphan (PROMETHAZINE -DM) 6.25-15 MG/5ML syrup Take 5 mLs by mouth 4 (four) times daily as needed for cough. (Patient taking differently: Take 5 mLs by mouth as needed for cough.) 240 mL 1   Semaglutide , 2 MG/DOSE, 8 MG/3ML SOPN Inject 2 mg as directed once a week. 3 mL 2   sildenafil  (VIAGRA ) 100 MG tablet Take   1/2 to 1 tablet   Daily  as needed for XXXX 30 tablet 1   SODIUM FLUORIDE 5000 PPM 1.1 % GEL dental gel  (Patient not taking: Reported on 04/25/2024)     tamsulosin  (FLOMAX ) 0.4 MG CAPS capsule TAKE 1 CAPSULE BY MOUTH AT BEDTIME FOR PROSTATE 90 capsule 0   valsartan -hydrochlorothiazide  (DIOVAN -HCT) 80-12.5 MG tablet Take 1 tablet by mouth daily. for blood pressure 90 tablet 3   Zinc 50 MG TABS Take by mouth.     Current Facility-Administered Medications  Medication Dose Route Frequency Provider Last Rate Last Admin   ipratropium-albuterol  (DUONEB) 0.5-2.5 (3) MG/3ML nebulizer solution 3 mL  3 mL Nebulization Once        Allergies: Allergies  Allergen Reactions   Bee Venom Anaphylaxis   Pine Anaphylaxis   Peanut -Containing Drug Products Itching and Other (See Comments)   Estonia Nut (Berthollefia Excelsa)     pruritus   Gabapentin  Other (See Comments)   Lisinopril  Other (See Comments)    Patient intolerant of drug  which caused his tongue to tingle.   Penicillins Itching   Celebrex  [Celecoxib ]    I reviewed his past medical history, social history, family history, and environmental history and no significant changes have been reported from his previous visit.  Review of Systems  Constitutional:  Negative for appetite change, chills, fever and unexpected weight change.  HENT:  Negative for congestion and rhinorrhea.   Eyes:  Negative for itching.  Respiratory:  Negative for cough, chest tightness, shortness of breath and wheezing.   Cardiovascular:  Negative for chest pain.  Gastrointestinal:  Negative for abdominal pain.  Genitourinary:  Negative for difficulty urinating.  Skin:  Negative for rash.  Allergic/Immunologic: Positive for environmental allergies and food allergies.  Neurological:  Negative for headaches.    Objective: BP 130/80 (BP Location: Right Arm, Patient Position: Sitting, Cuff Size: Large)   Temp 98 F (36.7 C) (Temporal)   Resp 18   Ht 5' 9 (1.753 m)   Wt 239 lb 6.4 oz (108.6 kg)   BMI 35.35 kg/m  Body mass index is 35.35 kg/m. Physical Exam Vitals and nursing note reviewed.  Constitutional:      Appearance: Normal appearance. He is well-developed. He is obese.  HENT:     Head: Normocephalic and atraumatic.     Right Ear: Tympanic membrane and external ear normal.     Left Ear: Tympanic membrane and external ear normal.     Nose: Nose normal.     Mouth/Throat:     Mouth: Mucous membranes are moist.     Pharynx: Oropharynx is clear.  Eyes:     Conjunctiva/sclera: Conjunctivae normal.  Cardiovascular:     Rate and Rhythm: Normal rate and regular rhythm.     Heart sounds: Normal heart sounds. No murmur heard. Pulmonary:     Effort: Pulmonary effort is normal.     Breath sounds: Normal breath sounds. No wheezing, rhonchi or rales.  Musculoskeletal:     Cervical back: Neck supple.  Skin:    General: Skin  is warm.     Findings: No rash.  Neurological:      Mental Status: He is alert and oriented to person, place, and time.  Psychiatric:        Behavior: Behavior normal.    Previous notes and tests were reviewed. The plan was reviewed with the patient/family, and all questions/concerned were addressed.  It was my pleasure to see George Montgomery today and participate in his care. Please feel free to contact me with any questions or concerns.  Sincerely,  Orlan Cramp, DO Allergy  & Immunology  [facility]  [place] office: [phone] [facility] office: [phone]

## 2024-05-31 ENCOUNTER — Ambulatory Visit: Admitting: Allergy

## 2024-05-31 ENCOUNTER — Encounter: Payer: Self-pay | Admitting: Allergy

## 2024-05-31 VITALS — BP 130/80 | Temp 98.0°F | Resp 18 | Ht 69.0 in | Wt 239.4 lb

## 2024-05-31 DIAGNOSIS — T7800XD Anaphylactic reaction due to unspecified food, subsequent encounter: Secondary | ICD-10-CM | POA: Diagnosis not present

## 2024-05-31 DIAGNOSIS — J302 Other seasonal allergic rhinitis: Secondary | ICD-10-CM

## 2024-05-31 DIAGNOSIS — J4540 Moderate persistent asthma, uncomplicated: Secondary | ICD-10-CM | POA: Diagnosis not present

## 2024-05-31 DIAGNOSIS — Z91038 Other insect allergy status: Secondary | ICD-10-CM | POA: Diagnosis not present

## 2024-05-31 DIAGNOSIS — T7840XD Allergy, unspecified, subsequent encounter: Secondary | ICD-10-CM

## 2024-05-31 DIAGNOSIS — B999 Unspecified infectious disease: Secondary | ICD-10-CM

## 2024-05-31 DIAGNOSIS — J3089 Other allergic rhinitis: Secondary | ICD-10-CM

## 2024-05-31 DIAGNOSIS — Z880 Allergy status to penicillin: Secondary | ICD-10-CM

## 2024-05-31 NOTE — Patient Instructions (Addendum)
 Allergic reaction For mild symptoms you can take over the counter antihistamines (zyrtec  10mg  to 20mg ) and monitor symptoms closely.  If symptoms worsen or if you have severe symptoms including breathing issues, throat closure, significant swelling, whole body hives, severe diarrhea and vomiting, lightheadedness then use epinephrine  and seek immediate medical care afterwards. Emergency action in place.  Avoid mammalian meat - no beef, pork, lamb.  I'm also checking for fire ant allergy .  Get bloodwork We are ordering labs, so please allow 1-2 weeks for the results to come back. With the newly implemented Cures Act, the labs might be visible to you at the same time that they become visible to me. However, I will not address the results until all of the results are back, so please be patient.  In the meantime, continue recommendations in your patient instructions, including avoidance measures (if applicable), until you hear from me.  Breathing Daily controller medication(s): Trelegy 200mcg 1 puff once a day and rinse mouth after each use.  May use albuterol  rescue inhaler 2 puffs or nebulizer every 4 to 6 hours as needed for shortness of breath, chest tightness, coughing, and wheezing. May use albuterol  rescue inhaler 2 puffs 5 to 15 minutes prior to strenuous physical activities. Monitor frequency of use - if you need to use it more than twice per week on a consistent basis let us  know.  Breathing control goals:  Full participation in all desired activities (may need albuterol  before activity) Albuterol  use two times or less a week on average (not counting use with activity) Cough interfering with sleep two times or less a month Oral steroids no more than once a year No hospitalizations  Food 2023 testing positive to [place] nuts, borderline to walnuts, pecan and pistachio.  More likely to have anaphylactic reaction to [place] nuts, walnuts. Continue strict avoidance of tree nuts and  peanuts. For mild symptoms you can take over the counter antihistamines (zyrtec  10mg  to 20mg ) and monitor symptoms closely.  If symptoms worsen or if you have severe symptoms including breathing issues, throat closure, significant swelling, whole body hives, severe diarrhea and vomiting, lightheadedness then use epinephrine  and seek immediate medical care afterwards. Emergency action plan in place.  Bee stings 2023 bloodwork positive to honeybee, white faced hornet, yellow jacket, wasp, yellow hornet and bumblebee. Continue to avoid. Continue venom injections.   Environmental allergies 2023 skin testing positive to dust mites, mold and cockroach. 2023 bloodwork positive to dust mites, grass, ragweed.  Borderline to cat, cockroach, tree, weed pollen. Continue environmental control measures. Continue Singulair  (montelukast ) 10mg  daily at night. Use over the counter antihistamines such as Zyrtec  (cetirizine ), Claritin (loratadine), Allegra  (fexofenadine ), or Xyzal (levocetirizine) daily as needed. May take twice a day during allergy  flares. May switch antihistamines every few months. Use Flonase  (fluticasone ) nasal spray 1-2 sprays per nostril once a day as needed for nasal congestion.  Nasal saline spray (i.e., Simply Saline) or nasal saline lavage (i.e., NeilMed) is recommended as needed and prior to medicated nasal sprays. Use azelastine  or ipratropium nasal spray 1-2 sprays per nostril twice a day as needed for runny nose/drainage. Allergy  injections.  Will re-assess about restarting in the winter months.   Infections Keep track of infections and antibiotics use.  Penicillin allergy : Consider penicillin allergy  skin testing and in office drug challenge in the future.   Follow up in 2 months or sooner if needed.  Alpha-gal and Red Meat Allergy    Overview An allergy  to "alpha-gal" refers to having a  severe and potentially life-threatening allergy  to a carbohydrate molecule called  galactose-alpha-1,3-galactose that is found in most mammalian or "red meat". Unlike other food allergies which typically occur within minutes of ingestion, symptoms from eating red meat such as pork, lamb or beef may be delayed, occurring 3-8 hours after eating. Most food allergies are directed against a protein molecule, but alpha-gal is unusual because it is a carbohydrate, and a delay in its absorption may explain the delay in symptoms.  Helpful website: [employer]ikerFestival.is  What are the symptoms of an alpha-gal allergy ? As with other food allergies, signs or symptoms of an allergy  to alpha-gal may include: Hives and itching  Swelling of your lips, face or eyelids  Shortness of breath, cough or wheezing  Abdominal pain, nausea, diarrhea or vomiting The most severe reaction, anaphylaxis, can present as a combination of several of these symptoms, may include low blood pressure, and is potentially fatal.  Because these symptoms are delayed, you may only wake up with them in the middle of the night after an evening meal.  How is an alpha-gal allergy  diagnosed? Diagnosis of this allergy  starts with your allergist taking an appropriate history and physical examination. Because the onset is usually quite delayed, it can be hard to associate the symptoms with eating red meat many hours previously. Triggers include any red meat - including beef, pork, lamb or even horse products. It may occur after eating hotdogs and hamburgers. In very rare cases the reaction may extend to milk or dairy proteins and gelatin.  Your allergist may recommend testing that includes skin tests to the relevant animal proteins and blood tests which measure the levels of a specific immunoglobulin E (IgE) antibody, to mammalian meats. An investigational blood test, IgE against alpha-gal itself, may also aid in the diagnosis.  How is an alpha-gal allergy  treated? Immediate symptoms such as hives or shortness of breath are  treated the same as any other food allergy  - in an urgent care setting with anti-histamines, epinephrine  and other medications. Prevention long-term involves avoidance of all red meat in sensitized individuals. You may be advised to carry an epinephrine  auto-injector, to be used in case of subsequent accidental exposures and reaction. These measures do not necessarily mean switching to a full vegetarian diet, since poultry and fish can be consumed and do not cause similar reactions. As with other food allergies, there is the possibility that over time the sensitivity diminishes - although these changes may take many years to become apparent.  How do you become allergic to alpha-gal? Alpha-gal is a molecule carried in the saliva of the [place] tick and other potential arthropods typically after feeding on mammalian blood. People that are bitten by the tick, especially those that are bitten repeatedly, are at risk of becoming sensitized and producing the IgE necessary to then cause allergic reactions. Interestingly, allergic reactions may occur to red meat, to subsequent tick bites, and even to medications that contain alpha-gal. Cetuximab is a cancer medication that contains alpha-gal, and people who have had allergic reactions to this medication (these are typically immediate reactions, because it is infused intravenously) have a higher risk for red meat allergy  and are likely to have been bitten by ticks in the past. As might be expected, the incidence of tick bites is much higher in the [place] the traditional habitat for the tick. However, cases are now increasingly reported in the [place] and [place]. And it is a phenomenon that has been observed  worldwide, with different ticks responsible for similar cases of red meat allergy  in many other countries such as [place], [place] and [place].  The discovery of this peculiar allergy  has allowed researchers to correlate tick  bites with many cases of anaphylaxis that would previously have been classified as 'idiopathic', or of unknown cause. Also, while it was originally thought that the [employer] tick had to feast on mammalian blood in order to carry the alpha-gal molecule, more recent research has shown that it may carry this molecule and be capable of sensitizing humans independently.  How do you prevent an alpha-gal allergy ? Because this allergy  is predominantly tick born, you are more likely at risk if you often go outdoors in wooded areas for activities such as hiking, fishing or hunting. The key strategy is to prevent tick bites. This may include wearing long sleeved shirts or pants, using appropriate insect repellants, and surveying for ticks after spending time outdoors. Any observed ticks should be removed carefully by cleaning the site with rubbing alcohol, then using tweezers to pull the tick's head up carefully from the skin using steady pressure. Clean your hands and the site one more time and make sure not to crush the tick between your fingers.

## 2024-06-03 LAB — IGE: IgE (Immunoglobulin E), Serum: 670 [IU]/mL — ABNORMAL HIGH (ref 6–495)

## 2024-06-05 ENCOUNTER — Telehealth: Payer: Self-pay

## 2024-06-05 LAB — IGE NUT PROF. W/COMPONENT RFLX

## 2024-06-05 NOTE — Telephone Encounter (Signed)
 Received onbase communication from [facility]; confirming results in chart.

## 2024-06-06 ENCOUNTER — Ambulatory Visit (INDEPENDENT_AMBULATORY_CARE_PROVIDER_SITE_OTHER)

## 2024-06-06 ENCOUNTER — Other Ambulatory Visit: Payer: Self-pay

## 2024-06-06 VITALS — Ht 69.0 in | Wt 243.0 lb

## 2024-06-06 DIAGNOSIS — Z0000 Encounter for general adult medical examination without abnormal findings: Secondary | ICD-10-CM | POA: Diagnosis not present

## 2024-06-06 DIAGNOSIS — Z87891 Personal history of nicotine dependence: Secondary | ICD-10-CM | POA: Diagnosis not present

## 2024-06-06 DIAGNOSIS — Z122 Encounter for screening for malignant neoplasm of respiratory organs: Secondary | ICD-10-CM

## 2024-06-06 DIAGNOSIS — Z1159 Encounter for screening for other viral diseases: Secondary | ICD-10-CM

## 2024-06-06 NOTE — Progress Notes (Signed)
 Subjective:   George Montgomery is a 68 y.o. who presents for a Medicare Wellness preventive visit.  As a reminder, Annual Wellness Visits don't include a physical exam, and some assessments may be limited, especially if this visit is performed virtually. We may recommend an in-person follow-up visit with your provider if needed.  Visit Complete: Virtual I connected with  Koren LITTIE Sous on 06/06/24 by a audio enabled telemedicine application and verified that I am speaking with the correct person using two identifiers.  Patient Location: Home  Provider Location: Office/Clinic  I discussed the limitations of evaluation and management by telemedicine. The patient expressed understanding and agreed to proceed.  Vital Signs: Because this visit was a virtual/telehealth visit, some criteria may be missing or patient reported. Any vitals not documented were not able to be obtained and vitals that have been documented are patient reported.  VideoDeclined- This patient declined Librarian, academic. Therefore the visit was completed with audio only.  Persons Participating in Visit: Patient.  AWV Questionnaire: No: Patient Medicare AWV questionnaire was not completed prior to this visit.  Cardiac Risk Factors include: advanced age (>103men, >47 women);diabetes mellitus;dyslipidemia;hypertension;male gender;obesity (BMI >30kg/m2)     Objective:    Today's Vitals   06/06/24 0931  Weight: 243 lb (110.2 kg)  Height: 5' 9 (1.753 m)   Body mass index is 35.88 kg/m.     06/06/2024    9:56 AM 05/12/2024   11:53 PM 04/07/2024    5:06 PM 10/16/2023    8:19 AM 03/22/2023   11:59 PM 02/08/2023    9:54 AM 08/11/2022   12:14 PM  Advanced Directives  Does Patient Have a Medical Advance Directive? Yes No Yes No No Yes Yes  Type of Estate agent of [place];Living will  Healthcare Power of [place];Living will   Living will;Healthcare Power of [employer] Power of [place];Living will  Does patient want to make changes to medical advance directive?      No - Patient declined No - Patient declined  Copy of Healthcare Power of Attorney in Chart? No - copy requested     No - copy requested No - copy requested  Would patient like information on creating a medical advance directive?  No - Patient declined   No - Patient declined      Current Medications (verified) Outpatient Encounter Medications as of 06/06/2024  Medication Sig   albuterol  (VENTOLIN  HFA) 108 (90 Base) MCG/ACT inhaler Inhale 2 puffs into the lungs every 4 (four) hours as needed for wheezing or shortness of breath (coughing fits).   EPINEPHrine  (EPIPEN  2-PAK) 0.3 mg/0.3 mL IJ SOAJ injection Inject 0.3 mg into the muscle as needed for anaphylaxis.   escitalopram  (LEXAPRO ) 10 MG tablet Take  1 tablet  Daily for Mood                                                              /  TAKE                                         BY                                                 MOUTH   fluticasone  (FLONASE ) 50 MCG/ACT nasal spray Place 1 spray into both nostrils 2 (two) times daily as needed (nasal congestion).   Fluticasone -Umeclidin-Vilant (TRELEGY ELLIPTA ) 200-62.5-25 MCG/ACT AEPB Inhale 1 puff into the lungs daily. Rinse mouth after each use.   ipratropium (ATROVENT ) 0.06 % nasal spray Use 1 to 2 sprays each nostril 2 to 3 x /day as needed   ketorolac  (TORADOL ) 10 MG tablet Take 10 mg by mouth every 6 (six) hours.   meloxicam  (MOBIC ) 15 MG tablet Take 1/2 to 1 tablet Daily with Food for Pain & Inflammation                                                                   /                                   TAKE                                 BY                                    MOUTH                               ONCE  ?  ?  ?  DAILY   montelukast  (SINGULAIR ) 10 MG tablet Take  1 tablet  Daily for Allergies                                /                                                                   TAKE                                         BY  MOUTH ONCE ? ? ?  DAILY   omeprazole  (PRILOSEC) 20 MG capsule Take  1 capsule  Daily to Prevent Heartburn & Indigestion                                   /                                                                   TAKE                                         BY                                                 MOUTH   ondansetron  (ZOFRAN ) 4 MG tablet Take 1 tablet (4 mg total) by mouth daily as needed for nausea or vomiting.   phentermine  (ADIPEX-P ) 37.5 MG tablet TAKE 1 TABLET EVERY MORNING FOR DIETING & WEIGHT LOSS   pregabalin (LYRICA) 50 MG capsule Take 50 mg by mouth 2 (two) times daily.   prochlorperazine  (COMPAZINE ) 10 MG tablet Take 1 tablet (10 mg total) by mouth every 6 (six) hours as needed for nausea or vomiting.   Semaglutide , 2 MG/DOSE, 8 MG/3ML SOPN Inject 2 mg as directed once a week.   sildenafil  (VIAGRA ) 100 MG tablet Take   1/2 to 1 tablet   Daily  as needed for XXXX   tamsulosin  (FLOMAX ) 0.4 MG CAPS capsule TAKE 1 CAPSULE BY MOUTH AT BEDTIME FOR PROSTATE   valsartan -hydrochlorothiazide  (DIOVAN -HCT) 80-12.5 MG tablet Take 1 tablet by mouth daily. for blood pressure   Zinc 50 MG TABS Take by mouth.   oxyCODONE -acetaminophen  (PERCOCET) 10-325 MG tablet Take 1 tablet by mouth every 6 (six) hours as needed. (Patient not taking: Reported on 06/06/2024)   predniSONE  (DELTASONE ) 50 MG tablet Take 1 tablet (50 mg total) by mouth daily. (Patient not taking: Reported on 06/06/2024)   promethazine -dextromethorphan (PROMETHAZINE -DM) 6.25-15 MG/5ML syrup Take 5 mLs by mouth 4 (four) times daily as needed for cough. (Patient not taking: Reported on 06/06/2024)   SODIUM FLUORIDE 5000 PPM 1.1 % GEL dental gel  (Patient not taking: Reported on 06/06/2024)   Facility-Administered Encounter Medications as of 06/06/2024   Medication   ipratropium-albuterol  (DUONEB) 0.5-2.5 (3) MG/3ML nebulizer solution 3 mL    Allergies (verified) Bee venom, Pine, Peanut -containing drug products, [place] nut (berthollefia excelsa), Gabapentin , Lisinopril , Penicillins, and Celebrex  [celecoxib ]   History: Past Medical History:  Diagnosis Date   Arthritis    Asthma    Back pain    Chronic pain of both shoulders    Depression    Dyspnea    Elevated glucose    Environmental allergies    GERD (gastroesophageal reflux disease)    HLD (hyperlipidemia)    HTN (hypertension)    Insomnia    Obesity    OSA on CPAP 08/01/2014   Reflux    Trigger finger  Past Surgical History:  Procedure Laterality Date   APPENDECTOMY  07/2012   bone spur removal     HEMORRHOID SURGERY  09/2012   HERNIA REPAIR     Family History  Problem Relation Age of Onset   Heart disease Mother    Macular degeneration Mother    Hypertension Mother    Hyperlipidemia Mother    Depression Mother    Anxiety disorder Mother    Liver disease Father    Alcoholism Father    Snoring Father    Allergic rhinitis Brother    Asthma Brother    Sinusitis Brother    Bronchitis Brother    Sleep apnea Brother    Stroke Maternal Aunt    Snoring Maternal Uncle    Cancer - Colon Paternal Uncle    Cancer - Prostate Paternal Uncle    Cancer - Colon Other    Cancer - Prostate Other    Immunodeficiency Neg Hx    Urticaria Neg Hx    Eczema Neg Hx    Angioedema Neg Hx    Social History   Socioeconomic History   Marital status: Married    Spouse name: Reena   Number of children: 4   Years of education: college   Highest education level: Not on file  Occupational History   Occupation: Advertising account planner    Comment: [employer]  Tobacco Use   Smoking status: Former    Current packs/day: 0.00    Average packs/day: 1 pack/day for 40.0 years (40.0 ttl pk-yrs)    Types: Cigarettes    Start date: 12/03/1972    Quit date: 12/03/2012     Years since quitting: 11.5   Smokeless tobacco: Never  Vaping Use   Vaping status: Never Used  Substance and Sexual Activity   Alcohol use: Yes    Alcohol/week: 1.0 standard drink of alcohol    Types: 1 Glasses of wine per week    Comment: occ   Drug use: No   Sexual activity: Yes    Birth control/protection: None  Other Topics Concern   Not on file  Social History Narrative   Right handed, caffeine 3-4 cups daily, Married, 4 kids, 9 g kids., FT insurance agent (self employed). 4 yrs college   Pt lives with wife    Social Drivers of Corporate investment banker Strain: Low Risk  (06/06/2024)   Overall Financial Resource Strain (CARDIA)    Difficulty of Paying Living Expenses: Not hard at all  Food Insecurity: No Food Insecurity (06/06/2024)   Hunger Vital Sign    Worried About Running Out of Food in the Last Year: Never true    Ran Out of Food in the Last Year: Never true  Transportation Needs: No Transportation Needs (06/06/2024)   PRAPARE - Administrator, Civil Service (Medical): No    Lack of Transportation (Non-Medical): No  Physical Activity: Inactive (06/06/2024)   Exercise Vital Sign    Days of Exercise per Week: 0 days    Minutes of Exercise per Session: 0 min  Stress: No Stress Concern Present (06/06/2024)   [employer] of Occupational Health - Occupational Stress Questionnaire    Feeling of Stress: Not at all  Social Connections: Moderately Integrated (06/06/2024)   Social Connection and Isolation Panel    Frequency of Communication with Friends and Family: More than three times a week    Frequency of Social Gatherings with Friends and Family: More than three times a week  Attends Religious Services: Never    Active Member of Clubs or Organizations: Yes    Attends Engineer, structural: More than 4 times per year    Marital Status: Married    Tobacco Counseling Counseling given: No    Clinical Intake:  Pre-visit preparation  completed: Yes  Pain : No/denies pain     BMI - recorded: 35.88 Nutritional Status: BMI > 30  Obese Nutritional Risks: None Diabetes: Yes CBG done?: No Did pt. bring in CBG monitor from home?: No  Lab Results  Component Value Date   HGBA1C 6.3 04/06/2024   HGBA1C 6.0 (H) 08/20/2023   HGBA1C 6.0 (H) 05/10/2023     How often do you need to have someone help you when you read instructions, pamphlets, or other written materials from your doctor or pharmacy?: 1 - Never  Interpreter Needed?: No  Information entered by :: Verdie Saba, CMA   Activities of Daily Living     06/06/2024    9:37 AM 08/20/2023   12:19 AM  In your present state of health, do you have any difficulty performing the following activities:  Hearing? 0 0  Vision? 0 0  Difficulty concentrating or making decisions? 0 0  Walking or climbing stairs? 0 0  Dressing or bathing? 0 0  Doing errands, shopping? 0 0  Preparing Food and eating ? N   Using the Toilet? N   In the past six months, have you accidently leaked urine? N   Do you have problems with loss of bowel control? N   Managing your Medications? N   Managing your Finances? N   Housekeeping or managing your Housekeeping? N     Patient Care Team: Lendia Boby CROME, NP-C as PCP - General (Family Medicine) Quinn Odor, [facility] (Inactive) as Pharmacist (Pharmacist)  I have updated your Care Teams any recent Medical Services you may have received from other providers in the past year.     Assessment:   This is a routine wellness examination for Javonn.  Hearing/Vision screen Hearing Screening - Comments:: Denies hearing difficulties   Vision Screening - Comments:: Wears rx glasses - up to date with routine eye exams with [facility]   Goals Addressed               This Visit's Progress     Patient Stated (pt-stated)        Patient stated he plans to lose weight        Depression Screen     06/06/2024    9:42 AM 04/06/2024    10:13 AM 01/14/2024    9:35 AM 08/20/2023   12:18 AM 05/09/2023   10:14 PM 02/08/2023    9:54 AM 10/25/2022    7:27 PM  PHQ 2/9 Scores  PHQ - 2 Score 0 0 0 0 0 0 0  PHQ- 9 Score 6 6         Fall Risk     06/06/2024    9:37 AM 01/14/2024    9:35 AM 08/20/2023   12:18 AM 05/09/2023   10:14 PM 02/08/2023    9:54 AM  Fall Risk   Falls in the past year? 1 0 0 0 0  Number falls in past yr: 1 0   0  Comment 3      Injury with Fall? 1 0   0  Comment right shoulder (no fracture) - just a pulled muscle      Risk for fall due  to :  No Fall Risks No Fall Risks No Fall Risks No Fall Risks  Follow up Falls evaluation completed;Falls prevention discussed Falls evaluation completed Falls prevention discussed;Education provided;Falls evaluation completed Falls prevention discussed;Education provided;Falls evaluation completed Falls evaluation completed;Falls prevention discussed    MEDICARE RISK AT HOME:  Medicare Risk at Home Any stairs in or around the home?: Yes If so, are there any without handrails?: No Home free of loose throw rugs in walkways, pet beds, electrical cords, etc?: Yes Adequate lighting in your home to reduce risk of falls?: Yes Life alert?: No Use of a cane, walker or w/c?: No Grab bars in the bathroom?: Yes Shower chair or bench in shower?: Yes Elevated toilet seat or a handicapped toilet?: Yes  TIMED UP AND GO:  Was the test performed?  No  Cognitive Function: 6CIT completed    07/18/2020   11:08 AM  MMSE - Mini Mental State Exam  Orientation to time 5  Orientation to Place 5  Registration 3  Attention/ Calculation 5  Recall 3  Language- name 2 objects 2  Language- repeat 1  Language- follow 3 step command 3  Language- read & follow direction 1  Write a sentence 1  Copy design 1  Total score 30        06/06/2024    9:54 AM  6CIT Screen  What Year? 0 points  What month? 0 points  What time? 0 points  Count back from 20 0 points  Months in reverse 0  points  Repeat phrase 0 points  Total Score 0 points    Immunizations Immunization History  Administered Date(s) Administered   Influenza Inj Mdck Quad With Preservative 08/19/2017, 07/25/2019   Influenza Split 08/06/2015   Influenza, High Dose Seasonal PF 08/07/2020, 07/15/2021, 08/04/2022   Influenza,inj,quad, With Preservative 08/20/2016, 07/20/2019   Moderna Sars-Covid-2 Vaccination 01/04/2020, 01/25/2020   PFIZER(Purple Top)SARS-COV-2 Vaccination 08/27/2020   PNEUMOCOCCAL CONJUGATE-20 10/31/2021   PPD Test 06/03/2018, 11/28/2019   Pneumococcal Polysaccharide-23 08/20/2016   Tdap 11/06/2016   Zoster, Live 11/06/2016    Screening Tests Health Maintenance  Topic Date Due   OPHTHALMOLOGY EXAM  Never done   Hepatitis C Screening  Never done   Zoster Vaccines- Shingrix (1 of 2) 01/18/1974   Lung Cancer Screening  08/08/2020   COVID-19 Vaccine (4 - 2024-25 season) 06/20/2023   Diabetic kidney evaluation - Urine ACR  05/09/2024   FOOT EXAM  05/08/2024   INFLUENZA VACCINE  05/19/2024   Colonoscopy  08/11/2025 (Originally 02/13/2020)   HEMOGLOBIN A1C  10/06/2024   Diabetic kidney evaluation - eGFR measurement  04/06/2025   Medicare Annual Wellness (AWV)  06/06/2025   DTaP/Tdap/Td (2 - Td or Tdap) 11/06/2026   Pneumococcal Vaccine: 50+ Years  Completed   HPV VACCINES  Aged Out   Meningococcal B Vaccine  Aged Out    Health Maintenance  Health Maintenance Due  Topic Date Due   OPHTHALMOLOGY EXAM  Never done   Hepatitis C Screening  Never done   Zoster Vaccines- Shingrix (1 of 2) 01/18/1974   Lung Cancer Screening  08/08/2020   COVID-19 Vaccine (4 - 2024-25 season) 06/20/2023   Diabetic kidney evaluation - Urine ACR  05/09/2024   FOOT EXAM  05/08/2024   INFLUENZA VACCINE  05/19/2024   Health Maintenance Items Addressed:  Referral sent to [facility] (smoker/hx smoking), Labs Ordered: Hepatitis C Screening   Additional Screening:  Vision Screening:  Recommended annual ophthalmology exams for early detection of glaucoma and  other disorders of the eye. Would you like a referral to an eye doctor? No  Patient stated he's had an eye exam w/[facility] in early 2025.  Dental Screening: Recommended annual dental exams for proper oral hygiene  Community Resource Referral / Chronic Care Management: CRR required this visit?  No   CCM required this visit?  No   Plan:    I have personally reviewed and noted the following in the patient's chart:   Medical and social history Use of alcohol, tobacco or illicit drugs  Current medications and supplements including opioid prescriptions. Patient is not currently taking opioid prescriptions. Functional ability and status Nutritional status Physical activity Advanced directives List of other physicians Hospitalizations, surgeries, and ER visits in previous 12 months Vitals Screenings to include cognitive, depression, and falls Referrals and appointments  In addition, I have reviewed and discussed with patient certain preventive protocols, quality metrics, and best practice recommendations. A written personalized care plan for preventive services as well as general preventive health recommendations were provided to patient.   Verdie CHRISTELLA Saba, CMA   06/06/2024   After Visit Summary: (MyChart) Due to this being a telephonic visit, the after visit summary with patients personalized plan was offered to patient via MyChart   Notes: Scheduled an appt w/PCP to discuss insomnia sxs/concerns for 06/14/2024.

## 2024-06-06 NOTE — Patient Instructions (Signed)
 George Montgomery , Thank you for taking time out of your busy schedule to complete your Annual Wellness Visit with me. I enjoyed our conversation and look forward to speaking with you again next year. I, as well as your care team,  appreciate your ongoing commitment to your health goals. Please review the following plan we discussed and let me know if I can assist you in the future. Your Game plan/ To Do List    Referrals: If you haven't heard from the office you've been referred to, please reach out to them at the phone provided. Ordered a Lung Cancer Screening and Hepatitis C Screening (lab)  Follow up Visits: We will see or speak with you next year for your Next Medicare AWV with our clinical staff Have you seen your provider in the last 6 months (3 months if uncontrolled diabetes)? Yes  Clinician Recommendations:  Aim for 30 minutes of exercise or brisk walking, 6-8 glasses of water, and 5 servings of fruits and vegetables each day. Educated and advised on getting the Influenza and Shingles vaccines in 2025.        This is a list of the screenings recommended for you:  Health Maintenance  Topic Date Due   Eye exam for diabetics  Never done   Hepatitis C Screening  Never done   Zoster (Shingles) Vaccine (1 of 2) 01/18/1974   Screening for Lung Cancer  08/08/2020   COVID-19 Vaccine (4 - 2024-25 season) 06/20/2023   Yearly kidney health urinalysis for diabetes  05/09/2024   Complete foot exam   05/08/2024   Flu Shot  05/19/2024   Colon Cancer Screening  08/11/2025*   Hemoglobin A1C  10/06/2024   Yearly kidney function blood test for diabetes  04/06/2025   Medicare Annual Wellness Visit  06/06/2025   DTaP/Tdap/Td vaccine (2 - Td or Tdap) 11/06/2026   Pneumococcal Vaccine for age over 66  Completed   HPV Vaccine  Aged Out   Meningitis B Vaccine  Aged Out  *Topic was postponed. The date shown is not the original due date.    Advanced directives: (Copy Requested) Please bring a copy of  your health care power of attorney and living will to the office to be added to your chart at your convenience. You can mail to [facility] [address] [place], [place] [address] or email to [email or web address] Advance Care Planning is important because it:  [x]  Makes sure you receive the medical care that is consistent with your values, goals, and preferences  [x]  It provides guidance to your family and loved ones and reduces their decisional burden about whether or not they are making the right decisions based on your wishes.  Follow the link provided in your after visit summary or read over the paperwork we have mailed to you to help you started getting your Advance Directives in place. If you need assistance in completing these, please reach out to us  so that we can help you!

## 2024-06-07 DIAGNOSIS — M2021 Hallux rigidus, right foot: Secondary | ICD-10-CM | POA: Insufficient documentation

## 2024-06-07 DIAGNOSIS — M21619 Bunion of unspecified foot: Secondary | ICD-10-CM | POA: Insufficient documentation

## 2024-06-07 LAB — IGE NUT PROF. W/COMPONENT RFLX
F017-IgE Hazelnut (Filbert): 0.11 kU/L — AB
F018-IgE Brazil Nut: 0.38 kU/L — AB
F202-IgE Cashew Nut: 0.13 kU/L — AB
F202-IgE Cashew Nut: <0.1 kU/L
F256-IgE Walnut: 0.16 kU/L — AB
Jug R 3 IgE: 0.16 kU/L — AB
Macadamia Nut, IgE: <0.1 kU/L
Peanut, IgE: 0.12 kU/L — AB
Pecan Nut IgE: <0.1 kU/L

## 2024-06-07 LAB — ALPHA-GAL PANEL
Allergen Lamb IgE: 0.27 kU/L — AB
Beef IgE: 0.41 kU/L — AB
IgE (Immunoglobulin E), Serum: 689 [IU]/mL — ABNORMAL HIGH (ref 6–495)
O215-IgE Alpha-Gal: 0.85 kU/L — AB
Pork IgE: 0.2 kU/L — AB

## 2024-06-07 LAB — PANEL 604726
Cor A 1 IgE: <0.1 kU/L
Cor A 14 IgE: <0.1 kU/L
Cor A 8 IgE: <0.1 kU/L
Cor A 9 IgE: <0.1 kU/L

## 2024-06-07 LAB — PANEL 604721
Jug R 1 IgE: 0.1 kU/L — AB
Jug R 3 IgE: <0.1 kU/L

## 2024-06-07 LAB — PANEL 604239: ANA O 3 IgE: <0.1 kU/L

## 2024-06-07 LAB — PEANUT COMPONENTS
F352-IgE Ara h 8: <0.1 kU/L
F422-IgE Ara h 1: <0.1 kU/L
F423-IgE Ara h 2: <0.1 kU/L
F424-IgE Ara h 3: <0.1 kU/L
F427-IgE Ara h 9: <0.1 kU/L
F447-IgE Ara h 6: <0.1 kU/L

## 2024-06-07 LAB — PANEL 604350: Ber E 1 IgE: 0.49 kU/L — AB

## 2024-06-07 LAB — ALLERGEN COMPONENT COMMENTS

## 2024-06-07 LAB — TRYPTASE: Tryptase: 6.1 ug/L (ref 2.2–13.2)

## 2024-06-07 LAB — ALLERGEN FIRE ANT: I070-IgE Fire Ant (Invicta): 0.52 kU/L — AB

## 2024-06-08 ENCOUNTER — Telehealth: Payer: Self-pay

## 2024-06-08 MED ORDER — TRAZODONE HCL 50 MG PO TABS: 50.0000 mg | ORAL_TABLET | Freq: Every day | ORAL | 0 refills | Status: DC

## 2024-06-08 NOTE — Addendum Note (Signed)
 Addended by: Kash Davie E on: 06/08/2024 01:03 PM   Modules accepted: Orders

## 2024-06-08 NOTE — Telephone Encounter (Signed)
 Rx sent, 30 days 0 refills until seen

## 2024-06-08 NOTE — Telephone Encounter (Signed)
 Copied from CRM [id number]. Topic: Clinical - Medication Question >> Jun 08, 2024 10:51 AM Vena HERO wrote: Reason for CRM: Pt called in to request medication Trazodone  HCL 50 mg to sleep. He recently found an old script from his previous provider and it has been helping him get rest he states. He does have an appt next week on 8/27.

## 2024-06-08 NOTE — Telephone Encounter (Signed)
 Please advise, do you want to wait until appt or?

## 2024-06-09 ENCOUNTER — Ambulatory Visit: Payer: Self-pay | Admitting: Allergy

## 2024-06-13 ENCOUNTER — Ambulatory Visit (INDEPENDENT_AMBULATORY_CARE_PROVIDER_SITE_OTHER)

## 2024-06-13 DIAGNOSIS — Z91038 Other insect allergy status: Secondary | ICD-10-CM | POA: Diagnosis not present

## 2024-06-14 ENCOUNTER — Ambulatory Visit (INDEPENDENT_AMBULATORY_CARE_PROVIDER_SITE_OTHER): Admitting: Family Medicine

## 2024-06-14 ENCOUNTER — Other Ambulatory Visit: Admitting: Surgical

## 2024-06-14 ENCOUNTER — Encounter: Payer: Self-pay | Admitting: Family Medicine

## 2024-06-14 ENCOUNTER — Ambulatory Visit: Admitting: Surgical

## 2024-06-14 ENCOUNTER — Ambulatory Visit: Admitting: Plastic Surgery

## 2024-06-14 VITALS — BP 124/52 | HR 72 | Temp 97.6°F | Ht 69.0 in | Wt 234.0 lb

## 2024-06-14 DIAGNOSIS — I700 Atherosclerosis of aorta: Secondary | ICD-10-CM

## 2024-06-14 DIAGNOSIS — E1169 Type 2 diabetes mellitus with other specified complication: Secondary | ICD-10-CM

## 2024-06-14 DIAGNOSIS — E669 Obesity, unspecified: Secondary | ICD-10-CM | POA: Insufficient documentation

## 2024-06-14 DIAGNOSIS — W19XXXA Unspecified fall, initial encounter: Secondary | ICD-10-CM

## 2024-06-14 DIAGNOSIS — N401 Enlarged prostate with lower urinary tract symptoms: Secondary | ICD-10-CM

## 2024-06-14 DIAGNOSIS — R0989 Other specified symptoms and signs involving the circulatory and respiratory systems: Secondary | ICD-10-CM

## 2024-06-14 DIAGNOSIS — R200 Anesthesia of skin: Secondary | ICD-10-CM | POA: Diagnosis not present

## 2024-06-14 DIAGNOSIS — G629 Polyneuropathy, unspecified: Secondary | ICD-10-CM | POA: Diagnosis not present

## 2024-06-14 DIAGNOSIS — N138 Other obstructive and reflux uropathy: Secondary | ICD-10-CM

## 2024-06-14 DIAGNOSIS — N182 Chronic kidney disease, stage 2 (mild): Secondary | ICD-10-CM

## 2024-06-14 DIAGNOSIS — G4733 Obstructive sleep apnea (adult) (pediatric): Secondary | ICD-10-CM

## 2024-06-14 DIAGNOSIS — R208 Other disturbances of skin sensation: Secondary | ICD-10-CM

## 2024-06-14 DIAGNOSIS — E1122 Type 2 diabetes mellitus with diabetic chronic kidney disease: Secondary | ICD-10-CM

## 2024-06-14 DIAGNOSIS — E785 Hyperlipidemia, unspecified: Secondary | ICD-10-CM

## 2024-06-14 DIAGNOSIS — N529 Male erectile dysfunction, unspecified: Secondary | ICD-10-CM

## 2024-06-14 DIAGNOSIS — I10 Essential (primary) hypertension: Secondary | ICD-10-CM

## 2024-06-14 DIAGNOSIS — F419 Anxiety disorder, unspecified: Secondary | ICD-10-CM

## 2024-06-14 DIAGNOSIS — L711 Rhinophyma: Secondary | ICD-10-CM

## 2024-06-14 MED ORDER — ALUMINUM CHLORIDE 20 % EX SOLN: Freq: Every day | CUTANEOUS | 0 refills | Status: DC

## 2024-06-14 NOTE — Progress Notes (Signed)
 Subjective:     Patient ID: George Montgomery, male    DOB: 1956-01-27, 68 y.o.   MRN: [id number]  Chief Complaint  Patient presents with   Follow-up    Was prescribed trazadone and seems to be working however feels a bit groggy in the morning  Wants to talk about neuropathy and balance issues, sweating    HPI  Discussed the use of AI scribe software for clinical note transcription with the patient, who gave verbal consent to proceed.  History of Present Illness George Montgomery is a 68 year old male with prediabetes and neuropathy who presents for follow-up on chronic health conditions, including concerns regarding neuropathy and insomnia.  Peripheral neuropathy - Numbness in the right foot, particularly in the toes and ball of the foot, occurring after prolonged sitting - Numbness is intermittent and relieved by moving toes - Attributes a fall to the numbness in the right foot - Takes pregabalin for neuropathy but is unsure of the dosage and has not been taking it regularly  Glycemic control and medication intolerance - Prediabetes, previously diagnosed as diabetes - Not currently taking any diabetes medication - Discontinued Ozempic  two months ago due to nausea and diarrhea - Experienced severe vomiting and diarrhea after taking Mounjaro , resulting in hospitalization - Not on insulin  therapy  Sleep disturbance and sleep apnea - Uses a BiPAP machine daily for sleep apnea - Insomnia present and takes Trazodone  with some benefit        Health Maintenance Due  Topic Date Due   OPHTHALMOLOGY EXAM  Never done   Hepatitis C Screening  Never done   Zoster Vaccines- Shingrix (1 of 2) 01/18/1974   Lung Cancer Screening  08/08/2020   FOOT EXAM  05/08/2024   Diabetic kidney evaluation - Urine ACR  05/09/2024   INFLUENZA VACCINE  05/19/2024    Past Medical History:  Diagnosis Date   Arthritis    Asthma    Back pain    Chronic pain of both shoulders    Depression     Dyspnea    Elevated glucose    Environmental allergies    GERD (gastroesophageal reflux disease)    HLD (hyperlipidemia)    HTN (hypertension)    Insomnia    Obesity    OSA on CPAP 08/01/2014   Reflux    Trigger finger     Past Surgical History:  Procedure Laterality Date   APPENDECTOMY  07/2012   bone spur removal     HEMORRHOID SURGERY  09/2012   HERNIA REPAIR      Family History  Problem Relation Age of Onset   Heart disease Mother    Macular degeneration Mother    Hypertension Mother    Hyperlipidemia Mother    Depression Mother    Anxiety disorder Mother    Liver disease Father    Alcoholism Father    Snoring Father    Allergic rhinitis Brother    Asthma Brother    Sinusitis Brother    Bronchitis Brother    Sleep apnea Brother    Stroke Maternal Aunt    Snoring Maternal Uncle    Cancer - Colon Paternal Uncle    Cancer - Prostate Paternal Uncle    Cancer - Colon Other    Cancer - Prostate Other    Immunodeficiency Neg Hx    Urticaria Neg Hx    Eczema Neg Hx    Angioedema Neg Hx     Social History  Socioeconomic History   Marital status: Married    Spouse name: Reena   Number of children: 4   Years of education: college   Highest education level: Not on file  Occupational History   Occupation: Advertising account planner    Comment: [employer]  Tobacco Use   Smoking status: Former    Current packs/day: 0.00    Average packs/day: 1 pack/day for 40.0 years (40.0 ttl pk-yrs)    Types: Cigarettes    Start date: 12/03/1972    Quit date: 12/03/2012    Years since quitting: 11.5   Smokeless tobacco: Never  Vaping Use   Vaping status: Never Used  Substance and Sexual Activity   Alcohol use: Yes    Alcohol/week: 1.0 standard drink of alcohol    Types: 1 Glasses of wine per week    Comment: occ   Drug use: No   Sexual activity: Yes    Birth control/protection: None  Other Topics Concern   Not on file  Social History Narrative   Right  handed, caffeine 3-4 cups daily, Married, 4 kids, 9 g kids., FT insurance agent (self employed). 4 yrs college   Pt lives with wife    Social Drivers of Corporate investment banker Strain: Low Risk  (06/06/2024)   Overall Financial Resource Strain (CARDIA)    Difficulty of Paying Living Expenses: Not hard at all  Food Insecurity: No Food Insecurity (06/06/2024)   Hunger Vital Sign    Worried About Running Out of Food in the Last Year: Never true    Ran Out of Food in the Last Year: Never true  Transportation Needs: No Transportation Needs (06/06/2024)   PRAPARE - Administrator, Civil Service (Medical): No    Lack of Transportation (Non-Medical): No  Physical Activity: Inactive (06/06/2024)   Exercise Vital Sign    Days of Exercise per Week: 0 days    Minutes of Exercise per Session: 0 min  Stress: No Stress Concern Present (06/06/2024)   [employer] of Occupational Health - Occupational Stress Questionnaire    Feeling of Stress: Not at all  Social Connections: Moderately Integrated (06/06/2024)   Social Connection and Isolation Panel    Frequency of Communication with Friends and Family: More than three times a week    Frequency of Social Gatherings with Friends and Family: More than three times a week    Attends Religious Services: Never    Database administrator or Organizations: Yes    Attends Engineer, structural: More than 4 times per year    Marital Status: Married  Catering manager Violence: Not At Risk (06/06/2024)   Humiliation, Afraid, Rape, and Kick questionnaire    Fear of Current or Ex-Partner: No    Emotionally Abused: No    Physically Abused: No    Sexually Abused: No    Outpatient Medications Prior to Visit  Medication Sig Dispense Refill   albuterol  (VENTOLIN  HFA) 108 (90 Base) MCG/ACT inhaler Inhale 2 puffs into the lungs every 4 (four) hours as needed for wheezing or shortness of breath (coughing fits). 18 g 1   EPINEPHrine  (EPIPEN   2-PAK) 0.3 mg/0.3 mL IJ SOAJ injection Inject 0.3 mg into the muscle as needed for anaphylaxis. 8 each 2   escitalopram  (LEXAPRO ) 10 MG tablet Take  1 tablet  Daily for Mood                                                              /  TAKE                                         BY                                                 MOUTH 90 tablet 3   fluticasone  (FLONASE ) 50 MCG/ACT nasal spray Place 1 spray into both nostrils 2 (two) times daily as needed (nasal congestion). 16 g 5   Fluticasone -Umeclidin-Vilant (TRELEGY ELLIPTA ) 200-62.5-25 MCG/ACT AEPB Inhale 1 puff into the lungs daily. Rinse mouth after each use. 60 each 5   ipratropium (ATROVENT ) 0.06 % nasal spray Use 1 to 2 sprays each nostril 2 to 3 x /day as needed 45 mL 3   meloxicam  (MOBIC ) 15 MG tablet Take 1/2 to 1 tablet Daily with Food for Pain & Inflammation                                                                   /                                   TAKE                                 BY                                    MOUTH                               ONCE  ?  ?  ?  DAILY 90 tablet 3   montelukast  (SINGULAIR ) 10 MG tablet Take  1 tablet  Daily for Allergies                               /                                                                   TAKE                                         BY  MOUTH ONCE ? ? ?  DAILY 90 tablet 3   omeprazole  (PRILOSEC) 20 MG capsule Take  1 capsule  Daily to Prevent Heartburn & Indigestion                                   /                                                                   TAKE                                         BY                                                 MOUTH 90 capsule 3   ondansetron  (ZOFRAN ) 4 MG tablet Take 1 tablet (4 mg total) by mouth daily as needed for nausea or vomiting. 30 tablet 1   phentermine  (ADIPEX-P ) 37.5 MG tablet TAKE 1  TABLET EVERY MORNING FOR DIETING & WEIGHT LOSS 30 tablet 0   pregabalin (LYRICA) 50 MG capsule Take 50 mg by mouth 2 (two) times daily.     prochlorperazine  (COMPAZINE ) 10 MG tablet Take 1 tablet (10 mg total) by mouth every 6 (six) hours as needed for nausea or vomiting. 20 tablet 0   sildenafil  (VIAGRA ) 100 MG tablet Take   1/2 to 1 tablet   Daily  as needed for XXXX 30 tablet 1   SODIUM FLUORIDE 5000 PPM 1.1 % GEL dental gel      tamsulosin  (FLOMAX ) 0.4 MG CAPS capsule TAKE 1 CAPSULE BY MOUTH AT BEDTIME FOR PROSTATE 90 capsule 0   traZODone  (DESYREL ) 50 MG tablet Take 1 tablet (50 mg total) by mouth at bedtime. 30 tablet 0   valsartan -hydrochlorothiazide  (DIOVAN -HCT) 80-12.5 MG tablet Take 1 tablet by mouth daily. for blood pressure 90 tablet 3   Zinc 50 MG TABS Take by mouth.     ketorolac  (TORADOL ) 10 MG tablet Take 10 mg by mouth every 6 (six) hours.     Semaglutide , 2 MG/DOSE, 8 MG/3ML SOPN Inject 2 mg as directed once a week. 3 mL 2   oxyCODONE -acetaminophen  (PERCOCET) 10-325 MG tablet Take 1 tablet by mouth every 6 (six) hours as needed. (Patient not taking: Reported on 06/06/2024)     predniSONE  (DELTASONE ) 50 MG tablet Take 1 tablet (50 mg total) by mouth daily. (Patient not taking: Reported on 06/14/2024) 5 tablet 0   promethazine -dextromethorphan (PROMETHAZINE -DM) 6.25-15 MG/5ML syrup Take 5 mLs by mouth 4 (four) times daily as needed for cough. (Patient not taking: Reported on 06/06/2024) 240 mL 1   Facility-Administered Medications Prior to Visit  Medication Dose Route Frequency Provider Last Rate Last Admin   ipratropium-albuterol  (DUONEB) 0.5-2.5 (3) MG/3ML nebulizer solution 3 mL  3 mL Nebulization Once         Allergies  Allergen Reactions   Bee Venom Anaphylaxis   Pine Anaphylaxis   Peanut -Containing Drug Products  Itching and Other (See Comments)   [place] Nut (Berthollefia Excelsa)     pruritus   Gabapentin  Other (See Comments)   Lisinopril  Other (See Comments)     Patient intolerant of drug which caused his tongue to tingle.   Penicillins Itching   Celebrex  [Celecoxib ]     ROS     Objective:    Physical Exam Constitutional:      General: He is not in acute distress.    Appearance: He is not ill-appearing.  HENT:     Mouth/Throat:     Mouth: Mucous membranes are moist.     Pharynx: Oropharynx is clear.  Eyes:     Extraocular Movements: Extraocular movements intact.     Conjunctiva/sclera: Conjunctivae normal.  Cardiovascular:     Rate and Rhythm: Normal rate.  Pulmonary:     Effort: Pulmonary effort is normal.  Musculoskeletal:     Cervical back: Normal range of motion and neck supple.     Right lower leg: No edema.     Left lower leg: No edema.     Comments: Bilateral decreased pedal pulses and decreased sensation to monofilament. Right foot with decreased sensation in 4th and 5th toes and lateral aspect of foot to mid foot.  Good capillary refill and ROM as well as strength  Skin:    General: Skin is warm and dry.  Neurological:     General: No focal deficit present.     Mental Status: He is alert and oriented to person, place, and time.     Sensory: Sensory deficit present.     Motor: No weakness.     Coordination: Coordination normal.     Gait: Gait normal.  Psychiatric:        Mood and Affect: Mood normal.        Behavior: Behavior normal.        Thought Content: Thought content normal.      BP (!) 124/52   Pulse 72   Temp 97.6 F (36.4 C) (Temporal)   Ht 5' 9 (1.753 m)   Wt 234 lb (106.1 kg)   SpO2 97%   BMI 34.56 kg/m  Wt Readings from Last 3 Encounters:  06/14/24 234 lb (106.1 kg)  06/06/24 243 lb (110.2 kg)  05/31/24 239 lb 6.4 oz (108.6 kg)       Assessment & Plan:   Problem List Items Addressed This Visit     Anxiety and depression   BPH with obstruction/lower urinary tract symptoms   Relevant Orders   Ambulatory referral to Urology   Essential hypertension   Hyperlipidemia associated with  type 2 diabetes mellitus (HCC)   Neuropathy   Relevant Orders   Ambulatory referral to Neurosurgery   Obesity (BMI 30-39.9)   OSA treated with BiPAP   Thoracic aortic atherosclerosis (HCC) by Chest CT scan on10/21/2020.   Type 2 diabetes mellitus with stage 2 chronic kidney disease, without long-term current use of insulin  (HCC)   Other Visit Diagnoses       Numbness of right foot    -  Primary   Relevant Orders   VAS US  ABI WITH/WO TBI   Ambulatory referral to Neurosurgery     Fall, initial encounter       Relevant Orders   Ambulatory referral to Neurosurgery     Decreased sensation of foot       Relevant Orders   VAS US  ABI WITH/WO TBI   Ambulatory referral to Neurosurgery  Decreased pedal pulses       Relevant Orders   VAS US  ABI WITH/WO TBI     Erectile dysfunction, unspecified erectile dysfunction type           Assessment and Plan Assessment & Plan Peripheral neuropathy, right foot predominant Numbness in the right foot, particularly in the toes and ball of the foot, with decreased sensation. Symptoms are intermittent and worsen after prolonged sitting. Recent fall due to lack of sensation in the right foot. -Decreased sensation to monofilament on foot exam today in L5-S1 distribution - Refer to Dr. Katrina, neurosurgeon, for evaluation  - Order ABI to assess blood flow in the legs. - Start pregabalin (Lyrica) daily for neuropathy since he has this at home.  Type 2 diabetes mellitus, well controlled Well-controlled diabetes. No current use of insulin . Previously on semaglutide  (Ozempic ) but discontinued due to nausea and diarrhea. - Discontinue semaglutide  (Ozempic ) due to side effects. - Follow up in October for diabetes management.  Benign prostatic hyperplasia with lower urinary tract symptoms Frequent urination with enlarged prostate. - Refer to urology per request for evaluation of urinary symptoms and ED.  Anxiety and depression Managed with  escitalopram  (Lexapro ). Reports excessive sweating in axillae as a side effect, which is bothersome but manageable. No current plans to change medication due to potential stress from life changes. - Continue escitalopram  (Lexapro ) 10 mg daily.   Arthritis of the hands Chronic pain managed with meloxicam . Advised to minimize use due to potential kidney and liver effects. Lyrica may help with pain management. - Reduce meloxicam  use and take as needed rather than daily.   Excessive axillary sweating (hyperhidrosis) Excessive sweating in the armpits, likely a side effect of escitalopram . He finds it embarrassing but is currently managing. - Drysol prescribed for excessive sweating. - Discuss potential weaning off escitalopram  if sweating becomes unmanageable.  Dry mouth (xerostomia) Persistent dry mouth, possibly related to medication use. - Ensure good oral hygiene and discuss with dentist for management options.  Obstructive sleep apnea Uses BiPAP regularly.  Decreased pedal pulses Decreased pulses noted, but no pain reported. - Order ABI to assess blood flow in the legs.  Fall Recent fall attributed to lack of sensation in the right foot  - Refer to Dr. Katrina, neurosurgeon, for evaluation   Decrease sensation bilaterally to monofilament Decreased sensation noted bilaterally, more pronounced in the right foot. Likely related to neuropathy. - Refer to Dr. Katrina, neurosurgeon, for evaluation      I have discontinued George Montgomery's promethazine -dextromethorphan, Semaglutide  (2 MG/DOSE), ketorolac , oxyCODONE -acetaminophen , and predniSONE . I am also having him start on aluminum  chloride. Additionally, I am having him maintain his fluticasone , sildenafil , Zinc, ipratropium, meloxicam , montelukast , ondansetron , Sodium Fluoride 5000 PPM, tamsulosin , phentermine , escitalopram , omeprazole , valsartan -hydrochlorothiazide , pregabalin, Trelegy Ellipta , albuterol , prochlorperazine ,  EPINEPHrine , and traZODone . We will continue to administer ipratropium-albuterol .  Meds ordered this encounter  Medications   aluminum  chloride (DRYSOL) 20 % external solution    Sig: Apply topically at bedtime.    Dispense:  35 mL    Refill:  0    Supervising Provider:   ROLLENE NORRIS A [[id number]]

## 2024-06-14 NOTE — Patient Instructions (Addendum)
 Start Lyrica   Reduce meloxicam  as tolerated   [facility]   Dr. Katrina Pack Neurosurgery   ABI testing

## 2024-06-14 NOTE — Progress Notes (Signed)
 Preoperative Dx: Rhinophyma nose  Postoperative Dx:  same  Procedure: laser to nose   Anesthesia: none  Description of Procedure:  Risks and complications were explained to the patient. Consent was confirmed and signed. Eye protection was placed. Time out was called and all information was confirmed to be correct. The area  area was prepped with alcohol and wiped dry. The TRL laser was set at 30-35. The nose was lasered. The patient tolerated the procedure well and there were no complications. The patient is to follow up in 4 weeks.

## 2024-06-16 ENCOUNTER — Emergency Department (HOSPITAL_BASED_OUTPATIENT_CLINIC_OR_DEPARTMENT_OTHER)
Admission: EM | Admit: 2024-06-16 | Discharge: 2024-06-16 | Disposition: A | Discharge status: Home / Self Care | Attending: Emergency Medicine | Admitting: Emergency Medicine

## 2024-06-16 ENCOUNTER — Emergency Department (HOSPITAL_BASED_OUTPATIENT_CLINIC_OR_DEPARTMENT_OTHER): Admitting: Radiology

## 2024-06-16 ENCOUNTER — Encounter (HOSPITAL_BASED_OUTPATIENT_CLINIC_OR_DEPARTMENT_OTHER): Payer: Self-pay

## 2024-06-16 DIAGNOSIS — Z91010 Allergy to peanuts: Secondary | ICD-10-CM | POA: Diagnosis not present

## 2024-06-16 DIAGNOSIS — R059 Cough, unspecified: Secondary | ICD-10-CM | POA: Diagnosis present

## 2024-06-16 DIAGNOSIS — Z7951 Long term (current) use of inhaled steroids: Secondary | ICD-10-CM | POA: Diagnosis not present

## 2024-06-16 DIAGNOSIS — R600 Localized edema: Secondary | ICD-10-CM | POA: Insufficient documentation

## 2024-06-16 DIAGNOSIS — I10 Essential (primary) hypertension: Secondary | ICD-10-CM | POA: Diagnosis not present

## 2024-06-16 DIAGNOSIS — Z87891 Personal history of nicotine dependence: Secondary | ICD-10-CM | POA: Insufficient documentation

## 2024-06-16 DIAGNOSIS — Z79899 Other long term (current) drug therapy: Secondary | ICD-10-CM | POA: Diagnosis not present

## 2024-06-16 DIAGNOSIS — J069 Acute upper respiratory infection, unspecified: Secondary | ICD-10-CM | POA: Insufficient documentation

## 2024-06-16 DIAGNOSIS — J4541 Moderate persistent asthma with (acute) exacerbation: Secondary | ICD-10-CM | POA: Insufficient documentation

## 2024-06-16 LAB — RESP PANEL BY RT-PCR (RSV, FLU A&B, COVID)  RVPGX2
Influenza A by PCR: NEGATIVE
Influenza B by PCR: NEGATIVE
Resp Syncytial Virus by PCR: NEGATIVE
SARS Coronavirus 2 by RT PCR: NEGATIVE

## 2024-06-16 MED ORDER — IPRATROPIUM-ALBUTEROL 0.5-2.5 (3) MG/3ML IN SOLN
3.0000 mL | Freq: Once | RESPIRATORY_TRACT | Status: AC
  Administered 2024-06-16: 3 mL via RESPIRATORY_TRACT

## 2024-06-16 MED ORDER — PREDNISONE 50 MG PO TABS
60.0000 mg | ORAL_TABLET | Freq: Once | ORAL | Status: AC
  Administered 2024-06-16: 60 mg via ORAL
  Filled 2024-06-16: qty 1

## 2024-06-16 MED ORDER — PREDNISONE 20 MG PO TABS: ORAL_TABLET | ORAL | 0 refills | Status: DC

## 2024-06-16 MED ORDER — IPRATROPIUM-ALBUTEROL 0.5-2.5 (3) MG/3ML IN SOLN
RESPIRATORY_TRACT | Status: AC
  Filled 2024-06-16: qty 3

## 2024-06-16 MED ORDER — BENZONATATE 100 MG PO CAPS: 100.0000 mg | ORAL_CAPSULE | Freq: Three times a day (TID) | ORAL | 0 refills | Status: DC

## 2024-06-16 NOTE — Discharge Instructions (Addendum)
 Use your albuterol  inhaler 2 puffs every 4-6 hours for the next few days and then just use it as you needed.  Make an appointment to follow-up with your primary care doctor.  Return to the emergency room if you have any worsening symptoms.

## 2024-06-16 NOTE — ED Notes (Signed)
 Bilateral wheezes noted. Strong cough productive of small amounts of thick, dark tan sputum. Former smoker. States he hasn't smoked in a very long time and smoked many substances as a youngster. Denies pulmonary history. Admits to outdoor sportsman routine and often sits around a fire  or burns brush (wears a mask with any flame work). Room air O2 sat 98%. HR 75 RR 19. Speaking in complete sentences. Frequent cough. NAD.

## 2024-06-16 NOTE — ED Provider Notes (Signed)
 [facility] AT [facility] Provider Note   CSN: [id number] Arrival date & time: 06/16/24  2046     Patient presents with: URI   George Montgomery is a 68 y.o. male.   Patient is a 68 year old male who presents with cough and congestion and wheezing.  He has a history of asthma.  He is a former smoker.  He has had a 2-day history of runny nose congestion and a productive cough.  He has felt chilled but has not had any documented fevers.  No vomiting.  He has some soreness and tightness across his chest, mostly with coughing.  No other chest pain.  He has some mild swelling of his extremities which is baseline for him.  He says the right leg is always a little more swollen than the left.  His wife has similar symptoms.       Prior to Admission medications   Medication Sig Start Date End Date Taking? Authorizing Provider  benzonatate  (TESSALON ) 100 MG capsule Take 1 capsule (100 mg total) by mouth every 8 (eight) hours. 06/16/24  Yes Lenor Hollering, MD  predniSONE  (DELTASONE ) 20 MG tablet 2 tabs po daily x 4 days 06/16/24  Yes Lenor Hollering, MD  albuterol  (VENTOLIN  HFA) 108 (90 Base) MCG/ACT inhaler Inhale 2 puffs into the lungs every 4 (four) hours as needed for wheezing or shortness of breath (coughing fits). 04/10/24   Luke Orlan HERO, DO  aluminum  chloride (DRYSOL) 20 % external solution Apply topically at bedtime. 06/14/24   Henson, Vickie L, NP-C  EPINEPHrine  (EPIPEN  2-PAK) 0.3 mg/0.3 mL IJ SOAJ injection Inject 0.3 mg into the muscle as needed for anaphylaxis. 05/13/24   Raford Lenis, MD  escitalopram  (LEXAPRO ) 10 MG tablet Take  1 tablet  Daily for Mood                                                              /                                                                   TAKE                                         BY                                                 MOUTH 02/21/24   Webb, Padonda B, FNP  fluticasone  (FLONASE ) 50 MCG/ACT nasal spray Place 1  spray into both nostrils 2 (two) times daily as needed (nasal congestion). 09/28/22   Luke Orlan HERO, DO  Fluticasone -Umeclidin-Vilant (TRELEGY ELLIPTA ) 200-62.5-25 MCG/ACT AEPB Inhale 1 puff into the lungs daily. Rinse mouth after each use. 04/10/24   Luke Orlan HERO, DO  ipratropium (ATROVENT ) 0.06 % nasal spray Use 1 to 2  sprays each nostril 2 to 3 x /day as needed 11/30/22   Tonita Fallow, MD  meloxicam  (MOBIC ) 15 MG tablet Take 1/2 to 1 tablet Daily with Food for Pain & Inflammation                                                                   /                                   TAKE                                 BY                                    MOUTH                               ONCE  ?  ?  ?  DAILY 05/13/23   Tonita Fallow, MD  montelukast  (SINGULAIR ) 10 MG tablet Take  1 tablet  Daily for Allergies                               /                                                                   TAKE                                         BY                                                 MOUTH ONCE ? ? ?  DAILY 09/05/23   Tonita Fallow, MD  omeprazole  (PRILOSEC) 20 MG capsule Take  1 capsule  Daily to Prevent Heartburn & Indigestion                                   /                                                                   TAKE  BY                                                 MOUTH 04/06/24   Henson, Vickie L, NP-C  ondansetron  (ZOFRAN ) 4 MG tablet Take 1 tablet (4 mg total) by mouth daily as needed for nausea or vomiting. 10/25/23 10/24/24  Wilkinson, Dana E, NP  phentermine  (ADIPEX-P ) 37.5 MG tablet TAKE 1 TABLET EVERY MORNING FOR DIETING & WEIGHT LOSS 02/21/24   Webb, Padonda B, FNP  pregabalin (LYRICA) 50 MG capsule Take 50 mg by mouth 2 (two) times daily.    [provider]  prochlorperazine  (COMPAZINE ) 10 MG tablet Take 1 tablet (10 mg total) by mouth every 6 (six) hours as needed for nausea or vomiting. 05/13/24   Raford Lenis,  MD  sildenafil  (VIAGRA ) 100 MG tablet Take   1/2 to 1 tablet   Daily  as needed for XXXX 11/17/22   Tonita Fallow, MD  SODIUM FLUORIDE 5000 PPM 1.1 % GEL dental gel  09/08/23   [provider]  tamsulosin  (FLOMAX ) 0.4 MG CAPS capsule TAKE 1 CAPSULE BY MOUTH AT BEDTIME FOR PROSTATE 02/21/24   Webb, Padonda B, FNP  traZODone  (DESYREL ) 50 MG tablet Take 1 tablet (50 mg total) by mouth at bedtime. 06/08/24   Henson, Vickie L, NP-C  valsartan -hydrochlorothiazide  (DIOVAN -HCT) 80-12.5 MG tablet Take 1 tablet by mouth daily. for blood pressure 04/06/24   Henson, Vickie L, NP-C  Zinc 50 MG TABS Take by mouth.    [provider]    Allergies: Bee venom, Pine, Peanut -containing drug products, [place] nut (berthollefia excelsa), Gabapentin , Lisinopril , Penicillins, and Celebrex  [celecoxib ]    Review of Systems  Constitutional:  Positive for fatigue. Negative for chills, diaphoresis and fever.  HENT:  Positive for congestion and rhinorrhea. Negative for sneezing.   Eyes: Negative.   Respiratory:  Positive for cough, chest tightness, shortness of breath and wheezing.   Cardiovascular:  Negative for chest pain and leg swelling.  Gastrointestinal:  Negative for abdominal pain, blood in stool, diarrhea, nausea and vomiting.  Genitourinary:  Negative for difficulty urinating, flank pain, frequency and hematuria.  Musculoskeletal:  Negative for arthralgias and back pain.  Skin:  Negative for rash.  Neurological:  Negative for dizziness, speech difficulty, weakness, numbness and headaches.    Updated Vital Signs BP (!) 114/40   Pulse 82   Temp 98.9 F (37.2 C) (Oral)   Resp 17   SpO2 95%   Physical Exam Constitutional:      Appearance: He is well-developed.  HENT:     Head: Normocephalic and atraumatic.  Eyes:     Pupils: Pupils are equal, round, and reactive to light.  Cardiovascular:     Rate and Rhythm: Normal rate and regular rhythm.     Heart sounds: Normal heart sounds.   Pulmonary:     Effort: Pulmonary effort is normal. No respiratory distress.     Breath sounds: Wheezing present. No rales.     Comments: Diminished breath sounds bilaterally Chest:     Chest wall: No tenderness.  Abdominal:     General: Bowel sounds are normal.     Palpations: Abdomen is soft.     Tenderness: There is no abdominal tenderness. There is no guarding or rebound.  Musculoskeletal:        General: Normal range of motion.  Cervical back: Normal range of motion and neck supple.     Comments: Trace edema to lower extremities bilaterally, the right leg is slightly more swollen than the left, no calf tenderness  Lymphadenopathy:     Cervical: No cervical adenopathy.  Skin:    General: Skin is warm and dry.     Findings: No rash.  Neurological:     Mental Status: He is alert and oriented to person, place, and time.     (all labs ordered are listed, but only abnormal results are displayed) Labs Reviewed  RESP PANEL BY RT-PCR (RSV, FLU A&B, COVID)  [id number]    EKG: None  Radiology: DG Chest 2 View Result Date: 06/16/2024 CLINICAL DATA:  Cough and wheezing EXAM: CHEST - 2 VIEW COMPARISON:  Chest x-ray 12/01/2023 FINDINGS: The heart size and mediastinal contours are within normal limits. Both lungs are clear. The visualized skeletal structures are unremarkable. IMPRESSION: No active cardiopulmonary disease. Electronically Signed   By: Greig Pique M.D.   On: 06/16/2024 22:58     Procedures   Medications Ordered in the ED  ipratropium-albuterol  (DUONEB) 0.5-2.5 (3) MG/3ML nebulizer solution (  Not Given 06/16/24 2220)  ipratropium-albuterol  (DUONEB) 0.5-2.5 (3) MG/3ML nebulizer solution 3 mL (3 mLs Nebulization Given 06/16/24 2218)  predniSONE  (DELTASONE ) tablet 60 mg (60 mg Oral Given 06/16/24 2242)                                    Medical Decision Making Amount and/or Complexity of Data Reviewed Radiology: ordered.  Risk Prescription drug  management.   This patient presents to the ED for concern of cough, shortness of breath, this involves an extensive number of treatment options, and is a complaint that carries with it a high risk of complications and morbidity.  I considered the following differential and admission for this acute, potentially life threatening condition.  The differential diagnosis includes URI, asthma exacerbation, pneumonia, PE, ACS, pulmonary edema  MDM:    Patient is a 68 year old who presents with cough and wheezing.  He had some associated URI symptoms.  He has no hypoxia.  He is overall well-appearing and is afebrile.  No symptoms that sound more concerning for ACS.  Chest x-ray does not show any evidence of pneumonia.  He was given albuterol  nebulizer treatment and is feeling much better.  He was started on prednisone .  His viral panel was negative for COVID/flu/RSV.  He was discharged home in good condition.  He was given a prescription for prednisone  as well as Tessalon  Perles.  He was encouraged to follow-up with his PCP.  Return precautions were given.  (Labs, imaging, consults)  Labs: I Ordered, and personally interpreted labs.  The pertinent results include: Viral panel was negative  Imaging Studies ordered: I ordered imaging studies including chest x-ray I independently visualized and interpreted imaging. I agree with the radiologist interpretation  Additional history obtained from chart.  External records from outside source obtained and reviewed including prior notes  Cardiac Monitoring: The patient was maintained on a cardiac monitor.  If on the cardiac monitor, I personally viewed and interpreted the cardiac monitored which showed an underlying rhythm of: Sinus rhythm  Reevaluation: After the interventions noted above, I reevaluated the patient and found that they have :improved  Social Determinants of Health:  former smoker  Disposition: Discharged to home  Co morbidities that  complicate the patient evaluation  Past  Medical History:  Diagnosis Date   Arthritis    Asthma    Back pain    Chronic pain of both shoulders    Depression    Dyspnea    Elevated glucose    Environmental allergies    GERD (gastroesophageal reflux disease)    HLD (hyperlipidemia)    HTN (hypertension)    Insomnia    Obesity    OSA on CPAP 08/01/2014   Reflux    Trigger finger      Medicines Meds ordered this encounter  Medications   ipratropium-albuterol  (DUONEB) 0.5-2.5 (3) MG/3ML nebulizer solution 3 mL   ipratropium-albuterol  (DUONEB) 0.5-2.5 (3) MG/3ML nebulizer solution    Knapp, Tabatha L: cabinet override   predniSONE  (DELTASONE ) tablet 60 mg   predniSONE  (DELTASONE ) 20 MG tablet    Sig: 2 tabs po daily x 4 days    Dispense:  8 tablet    Refill:  0   benzonatate  (TESSALON ) 100 MG capsule    Sig: Take 1 capsule (100 mg total) by mouth every 8 (eight) hours.    Dispense:  21 capsule    Refill:  0    I have reviewed the patients home medicines and have made adjustments as needed  Problem List / ED Course: Problem List Items Addressed This Visit   None Visit Diagnoses       Viral upper respiratory tract infection    -  Primary     Moderate persistent asthma with exacerbation       Relevant Medications   ipratropium-albuterol  (DUONEB) 0.5-2.5 (3) MG/3ML nebulizer solution 3 mL (Completed)   ipratropium-albuterol  (DUONEB) 0.5-2.5 (3) MG/3ML nebulizer solution   predniSONE  (DELTASONE ) tablet 60 mg (Completed)   predniSONE  (DELTASONE ) 20 MG tablet                Final diagnoses:  Viral upper respiratory tract infection  Moderate persistent asthma with exacerbation    ED Discharge Orders          Ordered    predniSONE  (DELTASONE ) 20 MG tablet        06/16/24 2320    benzonatate  (TESSALON ) 100 MG capsule  Every 8 hours        06/16/24 2320               Lenor Hollering, MD 06/16/24 2325

## 2024-06-16 NOTE — ED Notes (Signed)
 Aero Chamber given to patient for use with home rescue inhaler. Directions given. Patient verbalizes understanding.

## 2024-06-16 NOTE — ED Triage Notes (Signed)
 Pt c/o wheezing real bad, cough, congestion. Pt advises onset yesterday  Pt advises OTC meds for symptoms, a little benefit

## 2024-06-18 ENCOUNTER — Ambulatory Visit

## 2024-06-20 ENCOUNTER — Inpatient Hospital Stay: Admission: RE | Admit: 2024-06-20 | Source: Ambulatory Visit

## 2024-06-20 ENCOUNTER — Telehealth: Payer: Self-pay | Admitting: Acute Care

## 2024-06-20 NOTE — Telephone Encounter (Signed)
 Patient called in and requested to cancel and reschedule his LDCT today at 1pm. I called patient back but received his VM. I left him a VM stating we cancelled his scan but to call back to reschedule when he is available.

## 2024-06-26 ENCOUNTER — Ambulatory Visit: Payer: Self-pay | Admitting: Internal Medicine

## 2024-06-26 ENCOUNTER — Ambulatory Visit (INDEPENDENT_AMBULATORY_CARE_PROVIDER_SITE_OTHER)

## 2024-06-26 ENCOUNTER — Telehealth: Payer: Self-pay | Admitting: Family Medicine

## 2024-06-26 ENCOUNTER — Encounter: Payer: Self-pay | Admitting: Internal Medicine

## 2024-06-26 ENCOUNTER — Ambulatory Visit (INDEPENDENT_AMBULATORY_CARE_PROVIDER_SITE_OTHER): Admitting: Internal Medicine

## 2024-06-26 VITALS — BP 120/60 | HR 73 | Temp 98.3°F | Ht 69.0 in | Wt 230.0 lb

## 2024-06-26 DIAGNOSIS — R051 Acute cough: Secondary | ICD-10-CM

## 2024-06-26 DIAGNOSIS — R059 Cough, unspecified: Secondary | ICD-10-CM | POA: Diagnosis not present

## 2024-06-26 DIAGNOSIS — R0602 Shortness of breath: Secondary | ICD-10-CM | POA: Diagnosis not present

## 2024-06-26 DIAGNOSIS — R062 Wheezing: Secondary | ICD-10-CM | POA: Diagnosis not present

## 2024-06-26 MED ORDER — CEFDINIR 300 MG PO CAPS: 300.0000 mg | ORAL_CAPSULE | Freq: Two times a day (BID) | ORAL | 0 refills | Status: DC

## 2024-06-26 MED ORDER — PREDNISONE 20 MG PO TABS: 40.0000 mg | ORAL_TABLET | Freq: Every day | ORAL | 0 refills | Status: AC

## 2024-06-26 MED ORDER — AZITHROMYCIN 250 MG PO TABS
ORAL_TABLET | ORAL | 0 refills | Status: DC
Start: 2024-06-26 — End: 2024-07-26

## 2024-06-26 NOTE — Telephone Encounter (Signed)
 PT would like to transition from [place] to [place], are the providers ok with this change?

## 2024-06-26 NOTE — Progress Notes (Signed)
 Subjective:    Patient ID: George Montgomery, male    DOB: 09/08/1956, 68 y.o.   MRN: [id number]      HPI George Montgomery is here for No chief complaint on file.  Discussed the use of AI scribe software for clinical note transcription with the patient, who gave verbal consent to proceed.  History of Present Illness George Montgomery is a 68 year old male with asthma who presents with persistent symptoms following a diagnosis of acute viral bronchitis.  He was diagnosed with acute bronchitis two Fridays ago, approximately 10 days ago, after visiting the emergency room.  He was prescribed prednisone  for 4 days and Tessalon  Perles.  He does not feel that he is better-he would have expected from the treatment.  He has persistent symptoms including a deep cough, wheezing, and mucus production, which is mostly clear with occasional green coloration. He experiences significant fatigue, stating he has been 'in bed all week' and feels 'so sleepy I can barely stand'. No fever, but he reports intermittent chills.  He has a history of asthma and is currently using an albuterol  inhaler, which he finds helpful, and a maintenance inhaler similar to Trelegy, prescribed by his allergist. He experiences shortness of breath, lightheadedness, sore throat, sinus pain, and body aches, but denies chest pain and headaches.   He has a significant allergy  history, including alpha-gal syndrome, which was diagnosed after a severe reaction requiring multiple EpiPens. He avoids all mammalian meat and products containing animal derivatives. 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      Medications and allergies reviewed with patient and updated if appropriate.  Current Outpatient Medications on File Prior to Visit  Medication Sig Dispense Refill   albuterol  (VENTOLIN  HFA) 108 (90 Base) MCG/ACT inhaler Inhale 2 puffs into the  lungs every 4 (four) hours as needed for wheezing or shortness of breath (coughing fits). 18 g 1   aluminum  chloride (DRYSOL) 20 % external solution Apply topically at bedtime. 35 mL 0   benzonatate  (TESSALON ) 100 MG capsule Take 1 capsule (100 mg total) by mouth every 8 (eight) hours. 21 capsule 0   EPINEPHrine  (EPIPEN  2-PAK) 0.3 mg/0.3 mL IJ SOAJ injection Inject 0.3 mg into the muscle as needed for anaphylaxis. 8 each 2   escitalopram  (LEXAPRO ) 10 MG tablet Take  1 tablet  Daily for Mood                                                              /                                                                   TAKE                                         BY  MOUTH 90 tablet 3   fluticasone  (FLONASE ) 50 MCG/ACT nasal spray Place 1 spray into both nostrils 2 (two) times daily as needed (nasal congestion). 16 g 5   Fluticasone -Umeclidin-Vilant (TRELEGY ELLIPTA ) 200-62.5-25 MCG/ACT AEPB Inhale 1 puff into the lungs daily. Rinse mouth after each use. 60 each 5   ipratropium (ATROVENT ) 0.06 % nasal spray Use 1 to 2 sprays each nostril 2 to 3 x /day as needed 45 mL 3   meloxicam  (MOBIC ) 15 MG tablet Take 1/2 to 1 tablet Daily with Food for Pain & Inflammation                                                                   /                                   TAKE                                 BY                                    MOUTH                               ONCE  ?  ?  ?  DAILY 90 tablet 3   montelukast  (SINGULAIR ) 10 MG tablet Take  1 tablet  Daily for Allergies                               /                                                                   TAKE                                         BY                                                 MOUTH ONCE ? ? ?  DAILY 90 tablet 3   omeprazole  (PRILOSEC) 20 MG capsule Take  1 capsule  Daily to Prevent Heartburn & Indigestion                                   /  TAKE                                         BY                                                 MOUTH 90 capsule 3   ondansetron  (ZOFRAN ) 4 MG tablet Take 1 tablet (4 mg total) by mouth daily as needed for nausea or vomiting. 30 tablet 1   phentermine  (ADIPEX-P ) 37.5 MG tablet TAKE 1 TABLET EVERY MORNING FOR DIETING & WEIGHT LOSS 30 tablet 0   pregabalin (LYRICA) 50 MG capsule Take 50 mg by mouth 2 (two) times daily.     prochlorperazine  (COMPAZINE ) 10 MG tablet Take 1 tablet (10 mg total) by mouth every 6 (six) hours as needed for nausea or vomiting. 20 tablet 0   promethazine -dextromethorphan (PROMETHAZINE -DM) 6.25-15 MG/5ML syrup Take 5 mLs by mouth 4 (four) times daily as needed.     sildenafil  (VIAGRA ) 100 MG tablet Take   1/2 to 1 tablet   Daily  as needed for XXXX 30 tablet 1   SODIUM FLUORIDE 5000 PPM 1.1 % GEL dental gel      tamsulosin  (FLOMAX ) 0.4 MG CAPS capsule TAKE 1 CAPSULE BY MOUTH AT BEDTIME FOR PROSTATE 90 capsule 0   traZODone  (DESYREL ) 50 MG tablet Take 1 tablet (50 mg total) by mouth at bedtime. 30 tablet 0   valsartan -hydrochlorothiazide  (DIOVAN -HCT) 80-12.5 MG tablet Take 1 tablet by mouth daily. for blood pressure 90 tablet 3   Zinc 50 MG TABS Take by mouth.     Current Facility-Administered Medications on File Prior to Visit  Medication Dose Route Frequency Provider Last Rate Last Admin   ipratropium-albuterol  (DUONEB) 0.5-2.5 (3) MG/3ML nebulizer solution 3 mL  3 mL Nebulization Once         Review of Systems  Constitutional:  Positive for appetite change (decreased), chills (at times) and fatigue. Negative for fever.  HENT:  Positive for congestion, sinus pain and sore throat. Negative for ear pain.   Respiratory:  Positive for cough (productive- sputum - mostly clear - occ discoloration), shortness of breath and wheezing.   Cardiovascular:  Negative for chest pain.  Gastrointestinal:  Negative for nausea.  Musculoskeletal:   Negative for myalgias.  Neurological:  Positive for light-headedness. Negative for headaches.       Objective:   Vitals:   06/26/24 1535  BP: 120/60  Pulse: 73  Temp: 98.3 F (36.8 C)  SpO2: 92%   BP Readings from Last 3 Encounters:  06/26/24 120/60  06/16/24 (!) 107/49  06/14/24 (!) 124/52   Wt Readings from Last 3 Encounters:  06/26/24 230 lb (104.3 kg)  06/14/24 234 lb (106.1 kg)  06/06/24 243 lb (110.2 kg)   Body mass index is 33.97 kg/m.    Physical Exam Constitutional:      General: He is not in acute distress.    Appearance: Normal appearance. He is not ill-appearing.  HENT:     Head: Normocephalic and atraumatic.     Right Ear: Tympanic membrane, ear canal and external ear normal. There is no impacted cerumen.     Left Ear: Tympanic membrane, ear canal and external ear normal. There is no impacted cerumen.  Mouth/Throat:     Mouth: Mucous membranes are moist.     Pharynx: Posterior oropharyngeal erythema (Mild) present.  Cardiovascular:     Rate and Rhythm: Normal rate and regular rhythm.  Pulmonary:     Effort: Pulmonary effort is normal. No respiratory distress.     Breath sounds: Wheezing and rhonchi (Bilateral lower haves of lungs) present.  Skin:    General: Skin is warm and dry.  Neurological:     Mental Status: He is alert.            Assessment & Plan:    Assessment and Plan Assessment & Plan Asthmatic bronchitis Persistent symptoms of bronchitis and asthma exacerbation, possible community-acquired pneumonia despite initial negative chest x-ray. - Order chest x-ray. - Prescribe prednisone  40 mg for 5 days. - Prescribe azithromycin  (Z-Pak). - Prescribe Omnicef  300 mg twice daily. - Continue albuterol  inhaler and maintenance inhaler. - Use over-the-counter symptom relief as needed - Follow-up no improvement  Asthma Exacerbation likely due to respiratory infection. - Continue albuterol  inhaler as needed. - Continue maintenance  inhaler daily. - Prednisone  40 mg daily x 5 days

## 2024-06-26 NOTE — Patient Instructions (Addendum)
      Have a chest xray today.     Medications changes include :   prednisone  x 5 days, zpak, omnicef     Return if symptoms worsen or fail to improve.

## 2024-06-27 ENCOUNTER — Encounter: Payer: Medicare Other | Admitting: Internal Medicine

## 2024-06-27 ENCOUNTER — Encounter: Payer: Self-pay | Admitting: Emergency Medicine

## 2024-06-27 ENCOUNTER — Other Ambulatory Visit: Payer: Self-pay | Admitting: Allergy

## 2024-06-27 DIAGNOSIS — C4442 Squamous cell carcinoma of skin of scalp and neck: Secondary | ICD-10-CM | POA: Diagnosis not present

## 2024-06-28 ENCOUNTER — Ambulatory Visit: Payer: Self-pay | Admitting: Family Medicine

## 2024-06-28 ENCOUNTER — Ambulatory Visit (HOSPITAL_COMMUNITY)
Admission: RE | Admit: 2024-06-28 | Discharge: 2024-06-28 | Disposition: A | Discharge status: Home / Self Care | Source: Ambulatory Visit | Attending: Family Medicine | Admitting: Family Medicine

## 2024-06-28 DIAGNOSIS — R200 Anesthesia of skin: Secondary | ICD-10-CM | POA: Diagnosis not present

## 2024-06-28 DIAGNOSIS — R208 Other disturbances of skin sensation: Secondary | ICD-10-CM | POA: Insufficient documentation

## 2024-06-28 DIAGNOSIS — R0989 Other specified symptoms and signs involving the circulatory and respiratory systems: Secondary | ICD-10-CM | POA: Insufficient documentation

## 2024-06-28 LAB — VAS US ABI WITH/WO TBI
Left ABI: 1.08
Right ABI: 1.07

## 2024-06-28 NOTE — Telephone Encounter (Signed)
 LVM to schedule pt.

## 2024-06-28 NOTE — Telephone Encounter (Signed)
 I will accept - please schedule when he is due for his routine f/u

## 2024-06-29 ENCOUNTER — Other Ambulatory Visit: Payer: Self-pay | Admitting: Family

## 2024-06-29 DIAGNOSIS — N138 Other obstructive and reflux uropathy: Secondary | ICD-10-CM

## 2024-06-30 ENCOUNTER — Other Ambulatory Visit: Payer: Self-pay | Admitting: Family Medicine

## 2024-07-07 ENCOUNTER — Other Ambulatory Visit: Payer: Self-pay | Admitting: Family Medicine

## 2024-07-07 ENCOUNTER — Ambulatory Visit: Admitting: Family Medicine

## 2024-07-07 DIAGNOSIS — N138 Other obstructive and reflux uropathy: Secondary | ICD-10-CM

## 2024-07-07 MED ORDER — TAMSULOSIN HCL 0.4 MG PO CAPS
ORAL_CAPSULE | ORAL | 0 refills | Status: AC
Start: 2024-07-07 — End: ?

## 2024-07-07 NOTE — Telephone Encounter (Signed)
 Copied from CRM #[id number]. Topic: Clinical - Medication Refill >> Jul 07, 2024  3:12 PM Viola F wrote: Medication: tamsulosin  (FLOMAX ) 0.4 MG CAPS capsule [[id number]]  Has the patient contacted their pharmacy? No (Agent: If no, request that the patient contact the pharmacy for the refill. If patient does not wish to contact the pharmacy document the reason why and proceed with request.) (Agent: If yes, when and what did the pharmacy advise?)  This is the patient's preferred pharmacy:  [facility] [phone] [address], [place] - [address] [place] [address] Phone: [phone] Fax: [phone]   Is this the correct pharmacy for this prescription? Yes If no, delete pharmacy and type the correct one.   Has the prescription been filled recently? Yes  Is the patient out of the medication? Yes, 1 day   Has the patient been seen for an appointment in the last year OR does the patient have an upcoming appointment? Yes  Can we respond through MyChart? Yes  Agent: Please be advised that Rx refills may take up to 3 business days. We ask that you follow-up with your pharmacy.

## 2024-07-11 ENCOUNTER — Ambulatory Visit (INDEPENDENT_AMBULATORY_CARE_PROVIDER_SITE_OTHER)

## 2024-07-11 DIAGNOSIS — Z91038 Other insect allergy status: Secondary | ICD-10-CM

## 2024-07-12 ENCOUNTER — Telehealth: Payer: Self-pay | Admitting: Neurology

## 2024-07-12 NOTE — Telephone Encounter (Signed)
 I called pt and relayed that since we have not seen him for this he will need to get referral then new consult will be made.  I can't promise it will be prior to December.  He said he will get and hopefully can be seen sooner.  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  I did relay that I saw a referral to neurosurgery in 05/2024 by his pcp.

## 2024-07-12 NOTE — Telephone Encounter (Signed)
 Pt called to request to speak to MD about getting an sooner appt, Pt states he's has been having Numbness in both of his legs  and is very unbalanced. Pt is requesting to be seen sooner

## 2024-07-13 ENCOUNTER — Other Ambulatory Visit: Payer: Self-pay | Admitting: Allergy

## 2024-07-14 ENCOUNTER — Ambulatory Visit (INDEPENDENT_AMBULATORY_CARE_PROVIDER_SITE_OTHER): Payer: Self-pay | Admitting: Plastic Surgery

## 2024-07-14 DIAGNOSIS — L719 Rosacea, unspecified: Secondary | ICD-10-CM

## 2024-07-14 NOTE — Progress Notes (Signed)
 The patient is a 68 year old male here for follow-up on his nose.  He is quite pleased with the progress.  He had about 2 or 3 days where he stayed home after the last treatment and then it took about a week to get back to normal.  We did like to go higher on the settings.  And I have him come back or we can numb him up 30 minutes ahead of time and we might need to do the pro-nox to go to 50 or 60.  Patient is pleased with the plan

## 2024-07-16 NOTE — Progress Notes (Signed)
 Cardiology Office Note:    Date:  07/26/2024   ID:  George Montgomery, DOB [birth year], MRN [id number]  PCP:  Lendia Boby LITTIE, NP-C   [facility] Providers Cardiologist:  None     Referring MD: Lendia Boby LITTIE, NP-C   Chief Complaint  Patient presents with   Chest Pain   Coronary Artery Disease    History of Present Illness:    George Montgomery is a 68 y.o. male is seen at the request of Boby Lendia NP-C to reestablish cardiac care. Last seen in our practice in 2019. He has a history of HTN, HLD, OSA on CPAP, ED and obesity. Prediabetic. ETT in 2018 was normal except for hypertensive BP response.   He reports that last night he awoke with significant chest pain. Central chest without radiation. Got up to drink water and had belching. Pain resolved but then recurred - again relieved with belching. States pain was bad enough that if it didn't resolve he was going to call 911. He states he had similar episode 2 weeks ago. He has lost weight recently.   Past Medical History:  Diagnosis Date   Arthritis    Asthma    Back pain    Chronic pain of both shoulders    Depression    Dyspnea    Elevated glucose    Environmental allergies    GERD (gastroesophageal reflux disease)    HLD (hyperlipidemia)    HTN (hypertension)    Insomnia    Obesity    OSA on CPAP 08/01/2014   Reflux    Trigger finger     Past Surgical History:  Procedure Laterality Date   APPENDECTOMY  07/2012   bone spur removal     HEMORRHOID SURGERY  09/2012   HERNIA REPAIR      Current Medications: Current Meds  Medication Sig   albuterol  (VENTOLIN  HFA) 108 (90 Base) MCG/ACT inhaler Inhale 2 puffs into the lungs every 4 (four) hours as needed for wheezing or shortness of breath (coughing fits).   aluminum  chloride (DRYSOL) 20 % external solution Apply topically at bedtime.   aspirin EC 81 MG tablet Take 1 tablet (81 mg total) by mouth daily. Swallow whole.   benzonatate  (TESSALON ) 100 MG  capsule Take 1 capsule (100 mg total) by mouth every 8 (eight) hours.   EPINEPHrine  (EPIPEN  2-PAK) 0.3 mg/0.3 mL IJ SOAJ injection Inject 0.3 mg into the muscle as needed for anaphylaxis.   escitalopram  (LEXAPRO ) 10 MG tablet Take  1 tablet  Daily for Mood                                                              /                                                                   TAKE  BY                                                 MOUTH   fenofibrate  (TRICOR ) 145 MG tablet Take 1 tablet (145 mg total) by mouth daily.   fluticasone  (FLONASE ) 50 MCG/ACT nasal spray Place 1 spray into both nostrils 2 (two) times daily as needed (nasal congestion).   Fluticasone -Umeclidin-Vilant (TRELEGY ELLIPTA ) 200-62.5-25 MCG/ACT AEPB Inhale 1 puff into the lungs daily. Rinse mouth after each use.   ipratropium (ATROVENT ) 0.06 % nasal spray Use 1 to 2 sprays each nostril 2 to 3 x /day as needed   meloxicam  (MOBIC ) 15 MG tablet Take 1/2 to 1 tablet Daily with Food for Pain & Inflammation                                                                   /                                   TAKE                                 BY                                    MOUTH                               ONCE  ?  ?  ?  DAILY   montelukast  (SINGULAIR ) 10 MG tablet Take  1 tablet  Daily for Allergies                               /                                                                   TAKE                                         BY                                                 MOUTH ONCE ? ? ?  DAILY   omeprazole  (PRILOSEC) 20 MG capsule Take  1 capsule  Daily to Prevent Heartburn & Indigestion                                   /  TAKE                                         BY                                                 MOUTH   ondansetron  (ZOFRAN ) 4 MG tablet Take 1 tablet (4 mg total) by mouth daily as  needed for nausea or vomiting.   phentermine  (ADIPEX-P ) 37.5 MG tablet TAKE 1 TABLET EVERY MORNING FOR DIETING & WEIGHT LOSS   pregabalin (LYRICA) 50 MG capsule Take 50 mg by mouth 2 (two) times daily.   prochlorperazine  (COMPAZINE ) 10 MG tablet Take 1 tablet (10 mg total) by mouth every 6 (six) hours as needed for nausea or vomiting.   promethazine -dextromethorphan (PROMETHAZINE -DM) 6.25-15 MG/5ML syrup Take 5 mLs by mouth 4 (four) times daily as needed.   rosuvastatin (CRESTOR) 10 MG tablet Take 1 tablet (10 mg total) by mouth daily.   sildenafil  (VIAGRA ) 100 MG tablet Take   1/2 to 1 tablet   Daily  as needed for XXXX   tamsulosin  (FLOMAX ) 0.4 MG CAPS capsule TAKE 1 CAPSULE BY MOUTH AT BEDTIME FOR PROSTATE   traZODone  (DESYREL ) 50 MG tablet TAKE 1 TABLET BY MOUTH EVERYDAY AT BEDTIME   valsartan -hydrochlorothiazide  (DIOVAN -HCT) 80-12.5 MG tablet Take 1 tablet by mouth daily. for blood pressure   Zinc 50 MG TABS Take by mouth.   Current Facility-Administered Medications for the 07/26/24 encounter (Office Visit) with [place], Starsky Nanna M, MD  Medication   ipratropium-albuterol  (DUONEB) 0.5-2.5 (3) MG/3ML nebulizer solution 3 mL     Allergies:   Bee venom, Pine, Peanut -containing drug products, [place] nut (berthollefia excelsa), Gabapentin , Lisinopril , Penicillins, and Celebrex  [celecoxib ]   Social History   Socioeconomic History   Marital status: Married    Spouse name: Reena   Number of children: 4   Years of education: college   Highest education level: Not on file  Occupational History   Occupation: Advertising account planner    Comment: [employer]  Tobacco Use   Smoking status: Former    Current packs/day: 0.00    Average packs/day: 1 pack/day for 40.0 years (40.0 ttl pk-yrs)    Types: Cigarettes    Start date: 12/03/1972    Quit date: 12/03/2012    Years since quitting: 11.6   Smokeless tobacco: Never  Vaping Use   Vaping status: Never Used  Substance and Sexual Activity    Alcohol use: Yes    Alcohol/week: 1.0 standard drink of alcohol    Types: 1 Glasses of wine per week    Comment: occ   Drug use: No   Sexual activity: Yes    Birth control/protection: None  Other Topics Concern   Not on file  Social History Narrative   Right handed, caffeine 3-4 cups daily, Married, 4 kids, 9 g kids., FT insurance agent (self employed). 4 yrs college   Pt lives with wife    Social Drivers of Health   Financial Resource Strain: Low Risk  (06/06/2024)   Overall Financial Resource Strain (CARDIA)    Difficulty of Paying Living Expenses: Not hard at all  Food Insecurity: No Food Insecurity (06/06/2024)   Hunger Vital Sign    Worried About  Running Out of Food in the Last Year: Never true    Ran Out of Food in the Last Year: Never true  Transportation Needs: No Transportation Needs (06/06/2024)   PRAPARE - Administrator, Civil Service (Medical): No    Lack of Transportation (Non-Medical): No  Physical Activity: Inactive (06/06/2024)   Exercise Vital Sign    Days of Exercise per Week: 0 days    Minutes of Exercise per Session: 0 min  Stress: No Stress Concern Present (06/06/2024)   [employer] of Occupational Health - Occupational Stress Questionnaire    Feeling of Stress: Not at all  Social Connections: Moderately Integrated (06/06/2024)   Social Connection and Isolation Panel    Frequency of Communication with Friends and Family: More than three times a week    Frequency of Social Gatherings with Friends and Family: More than three times a week    Attends Religious Services: Never    Database administrator or Organizations: Yes    Attends Engineer, structural: More than 4 times per year    Marital Status: Married     Family History: The patient's family history includes Alcoholism in his father; Allergic rhinitis in his brother; Anxiety disorder in his mother; Asthma in his brother; Bronchitis in his brother; Cancer - Colon in his paternal  uncle and another family member; Cancer - Prostate in his paternal uncle and another family member; Depression in his mother; Heart disease in his mother; Hyperlipidemia in his mother; Hypertension in his brother and mother; Liver disease in his father; Macular degeneration in his mother; Sinusitis in his brother; Sleep apnea in his brother; Snoring in his father and maternal uncle; Stroke in his maternal aunt. There is no history of Immunodeficiency, Urticaria, Eczema, or Angioedema.  ROS:   Please see the history of present illness.     All other systems reviewed and are negative.  EKGs/Labs/Other Studies Reviewed:    The following studies were reviewed today: EKG Interpretation Date/Time:  Wednesday July 26 2024 12:03:08 EDT Ventricular Rate:  71 PR Interval:  172 QRS Duration:  88 QT Interval:  408 QTC Calculation: 443 R Axis:   15  Text Interpretation: Normal sinus rhythm Normal ECG When compared with ECG of 07-Apr-2024 17:20, No significant change was found Confirmed by [place], Jordon Bourquin [phone]) on 07/26/2024 12:05:14 PM         Recent Labs: 08/20/2023: Magnesium 2.1 04/06/2024: ALT 37; BUN 21; Creatinine, Ser 0.88; Hemoglobin 14.9; Platelets 211.0; Potassium 3.7; Sodium 138; TSH 1.12  Recent Lipid Panel    Component Value Date/Time   CHOL 150 04/06/2024 1055   TRIG 213.0 (H) 04/06/2024 1055   HDL 33.90 (L) 04/06/2024 1055   CHOLHDL 4 04/06/2024 1055   VLDL 42.6 (H) 04/06/2024 1055   LDLCALC 73 04/06/2024 1055   LDLCALC 101 (H) 08/20/2023 1141     Risk Assessment/Calculations:                Physical Exam:    VS:  BP (!) 126/56 (BP Location: Left Arm, Patient Position: Sitting, Cuff Size: Large)   Pulse 71   Ht 5' 9 (1.753 m)   Wt 235 lb (106.6 kg)   PF 97 L/min   BMI 34.70 kg/m     Wt Readings from Last 3 Encounters:  07/26/24 235 lb (106.6 kg)  06/26/24 230 lb (104.3 kg)  06/14/24 234 lb (106.1 kg)     GEN:  Well nourished, well developed in  no acute  distress HEENT: Normal NECK: No JVD; No carotid bruits LYMPHATICS: No lymphadenopathy CARDIAC: RRR, no murmurs, rubs, gallops RESPIRATORY:  Clear to auscultation without rales, wheezing or rhonchi  ABDOMEN: Soft, non-tender, non-distended MUSCULOSKELETAL:  No edema; No deformity  SKIN: Warm and dry NEUROLOGIC:  Alert and oriented x 3 PSYCHIATRIC:  Normal affect   ASSESSMENT:    1. Precordial pain   2. Coronary artery calcification   3. Primary hypertension   4. Mixed hyperlipidemia    PLAN:    In order of problems listed above:  Chest pain. Somewhat atypical given relief with belching but still a concern. Ecg today is normal. I reviewed prior CT of chest in 2020 and he had significant coronary calcification in all 3 vessels. Recommend starting on statin Crestor 10 mg daily. Goal LDL < 55. Will start ASA 81 mg daily. Arrange for coronary CTA.  Mixed HLD. On fenofibrate  for elevated triglycerides. Will add Crestor HTN well controlled Obesity with OSA on CPAP Prediabetes.            Medication Adjustments/Labs and Tests Ordered: Current medicines are reviewed at length with the patient today.  Concerns regarding medicines are outlined above.  Orders Placed This Encounter  Procedures   EKG 12-Lead   Meds ordered this encounter  Medications   fenofibrate  (TRICOR ) 145 MG tablet    Sig: Take 1 tablet (145 mg total) by mouth daily.   rosuvastatin (CRESTOR) 10 MG tablet    Sig: Take 1 tablet (10 mg total) by mouth daily.    Dispense:  90 tablet    Refill:  3   aspirin EC 81 MG tablet    Sig: Take 1 tablet (81 mg total) by mouth daily. Swallow whole.    There are no Patient Instructions on file for this visit.   Signed, Deshia Vanderhoof [place], MD  07/26/2024 11:59 AM    [facility]

## 2024-07-17 ENCOUNTER — Other Ambulatory Visit (HOSPITAL_COMMUNITY): Payer: Self-pay

## 2024-07-17 MED ORDER — LIDOCAINE 23% - TETRACAINE 7% TOPICAL OINTMENT (PLASTICIZED)
1.0000 | TOPICAL_OINTMENT | Freq: Once | CUTANEOUS | 0 refills | Status: AC
  Filled 2024-07-17: qty 60, 1d supply, fill #0

## 2024-07-17 NOTE — Addendum Note (Signed)
 Addended by: LOWERY ESTEFANA RAMAN on: 07/17/2024 11:04 AM   Modules accepted: Orders

## 2024-07-20 ENCOUNTER — Telehealth: Payer: Self-pay

## 2024-07-20 DIAGNOSIS — G629 Polyneuropathy, unspecified: Secondary | ICD-10-CM

## 2024-07-20 DIAGNOSIS — N529 Male erectile dysfunction, unspecified: Secondary | ICD-10-CM

## 2024-07-20 DIAGNOSIS — N138 Other obstructive and reflux uropathy: Secondary | ICD-10-CM

## 2024-07-20 DIAGNOSIS — R200 Anesthesia of skin: Secondary | ICD-10-CM

## 2024-07-20 NOTE — Telephone Encounter (Signed)
 Copied from CRM [id number]. Topic: Referral - Request for Referral >> Jul 20, 2024  4:09 PM Roselie BROCKS wrote: Did the patient discuss referral with their provider in the last year? Yes (If No - schedule appointment) (If Yes - send message)  Appointment offered? No  Type of order/referral and detailed reason for visit: referral for Neurology with DR Buck Saucer  Preference of office, provider, location: Dr Buck Saucer   If referral order, have you been seen by this specialty before? Yes (If Yes, this issue or another issue? When? Where? Patient sees this proivider for sleep  Can we respond through MyChart? Yes

## 2024-07-20 NOTE — Telephone Encounter (Signed)
 Copied from CRM [id number]. Topic: Referral - Request for Referral >> Jul 20, 2024  4:15 PM Roselie BROCKS wrote: Did the patient discuss referral with their provider in the last year? Yes (If No - schedule appointment) (If Yes - send message)  Appointment offered? No  Type of order/referral and detailed reason for visit: Urologist   Preference of office, provider, location: no  If referral order, have you been seen by this specialty before? No (If Yes, this issue or another issue? When? Where?  Can we respond through MyChart? No

## 2024-07-21 NOTE — Addendum Note (Signed)
 Addended by: Shawne Bulow E on: 07/21/2024 09:13 AM   Modules accepted: Orders

## 2024-07-21 NOTE — Telephone Encounter (Unsigned)
 Copied from CRM [id number]. Topic: General - Other >> Jul 21, 2024  4:04 PM Burnard DEL wrote: Reason for CRM: Patient returned call to [facility] regarding referral to neurology.Patient stated that he would like the referral to neurology for numbness in his feet. He said that he already sees her for sleep but needs a referral for the different issue.

## 2024-07-21 NOTE — Telephone Encounter (Signed)
 LM for pt to let us  know reason for referral

## 2024-07-21 NOTE — Telephone Encounter (Signed)
 Cancelled, LM for pt he has already been referred to [facility]. Provided their number for him to schedule

## 2024-07-21 NOTE — Telephone Encounter (Signed)
 Referral placed.

## 2024-07-24 NOTE — Telephone Encounter (Signed)
 Referral placed.

## 2024-07-24 NOTE — Addendum Note (Signed)
 Addended by: Matalynn Graff E on: 07/24/2024 09:49 AM   Modules accepted: Orders

## 2024-07-26 ENCOUNTER — Ambulatory Visit: Attending: Cardiology | Admitting: Cardiology

## 2024-07-26 ENCOUNTER — Ambulatory Visit (INDEPENDENT_AMBULATORY_CARE_PROVIDER_SITE_OTHER): Payer: Self-pay | Admitting: Plastic Surgery

## 2024-07-26 ENCOUNTER — Encounter: Payer: Self-pay | Admitting: Cardiology

## 2024-07-26 VITALS — BP 112/63 | HR 75

## 2024-07-26 VITALS — BP 126/56 | HR 71 | Ht 69.0 in | Wt 235.0 lb

## 2024-07-26 DIAGNOSIS — E782 Mixed hyperlipidemia: Secondary | ICD-10-CM

## 2024-07-26 DIAGNOSIS — I10 Essential (primary) hypertension: Secondary | ICD-10-CM

## 2024-07-26 DIAGNOSIS — L711 Rhinophyma: Secondary | ICD-10-CM

## 2024-07-26 DIAGNOSIS — R072 Precordial pain: Secondary | ICD-10-CM

## 2024-07-26 DIAGNOSIS — I2510 Atherosclerotic heart disease of native coronary artery without angina pectoris: Secondary | ICD-10-CM | POA: Diagnosis not present

## 2024-07-26 MED ORDER — ROSUVASTATIN CALCIUM 10 MG PO TABS: 10.0000 mg | ORAL_TABLET | Freq: Every day | ORAL | 3 refills | Status: DC

## 2024-07-26 MED ORDER — ASPIRIN 81 MG PO TBEC: 81.0000 mg | DELAYED_RELEASE_TABLET | Freq: Every day | ORAL | Status: AC

## 2024-07-26 MED ORDER — METOPROLOL TARTRATE 100 MG PO TABS: ORAL_TABLET | ORAL | 0 refills | Status: DC

## 2024-07-26 MED ORDER — FENOFIBRATE 145 MG PO TABS: 145.0000 mg | ORAL_TABLET | Freq: Every day | ORAL | Status: AC

## 2024-07-26 NOTE — Patient Instructions (Addendum)
 Medication Instructions:  Start Crestor 10 mg daily Start Aspirin 81 mg daily  Continue all other medication  *If you need a refill on your cardiac medications before your next appointment, please call your pharmacy*  Lab Work: Bmet today  Testing/Procedures: Coronary CTA will be scheduled after approved by insurance   Follow instructions below  Follow-Up: At [facility], you and your health needs are our priority.  As part of our continuing mission to provide you with exceptional heart care, our providers are all part of one team.  This team includes your primary Cardiologist (physician) and Advanced Practice Providers or APPs (Physician Assistants and Nurse Practitioners) who all work together to provide you with the care you need, when you need it.  Your next appointment:  After Test     Provider:  Dr.Jordan     Your cardiac CT will be scheduled at one of the below locations:   [facility] [address] [place], [place] [address] [phone] (Severe contrast allergies only)  OR   [facility] [address] [place], [place] [address] [phone]  OR   [facility] [address] [place], [place] [address] [phone]  OR   [facility] Tower [address]  [place], [place] [address] [phone]  OR   [facility] [address] [place], [place] [phone]  If scheduled at [facility], please arrive at the [facility] and Children's Entrance (Entrance C2) of [facility] 30 minutes prior to test start time. You can use the FREE valet parking offered at entrance C (encouraged to control the heart rate for the test)  Proceed to the [facility] Radiology Department (first floor) to check-in and test prep.  All radiology patients and guests should use entrance C2 at [facility], accessed from [facility], even though the hospital's  physical address listed is [address]  If scheduled at the Heart and Vascular Tower at [address], please enter the parking lot using the [address] entrance and use the FREE valet service at the patient drop-off area. Enter the building and check-in with registration on the main floor.  If scheduled at [facility], please arrive to the Heart and Vascular Center 15 mins early for check-in and test prep.  There is spacious parking and easy access to the radiology department from the [facility] Heart and Vascular entrance. Please enter here and check-in with the desk attendant.   If scheduled at [facility], please arrive 30 minutes early for check-in and test prep.  Please follow these instructions carefully (unless otherwise directed):  An IV will be required for this test and Nitroglycerin will be given.  Hold all erectile dysfunction medications at least 3 days (72 hrs) prior to test. (Ie viagra , cialis, sildenafil , tadalafil, etc)   On the Night Before the Test: Be sure to Drink plenty of water. Do not consume any caffeinated/decaffeinated beverages or chocolate 12 hours prior to your test. Do not take any antihistamines 12 hours prior to your test.    On the Day of the Test: Drink plenty of water until 1 hour prior to the test. Do not eat any food 1 hour prior to test. You may take your regular medications prior to the test.  Take metoprolol 100 mg  two hours prior to test. If you take Furosemide/Hydrochlorothiazide /Spironolactone/Chlorthalidone, please HOLD on the morning of the test.  After the Test: Drink plenty of water. After receiving IV contrast, you may experience a mild flushed feeling. This is normal. On occasion, you may experience a mild rash up to 24 hours after the test. This is not dangerous. If this occurs, you can take Benadryl  25 mg, Zyrtec , Claritin, or Allegra  and increase your fluid intake. (Patients  taking Tikosyn should avoid Benadryl , and may take Zyrtec , Claritin, or Allegra ) If you experience trouble breathing, this can be serious. If it is severe call 911 IMMEDIATELY. If it is mild, please call our office.  We will call to schedule your test 2-4 weeks out understanding that some insurance companies will need an authorization prior to the service being performed.   For more information and frequently asked questions, please visit our website : [email or web address]  For non-scheduling related questions, please contact the cardiac imaging nurse navigator should you have any questions/concerns: Cardiac Imaging Nurse Navigators Direct Office Dial: [phone]   For scheduling needs, including cancellations and rescheduling, please call [place], [phone].   We recommend signing up for the patient portal called MyChart.  Sign up information is provided on this After Visit Summary.  MyChart is used to connect with patients for Virtual Visits (Telemedicine).  Patients are able to view lab/test results, encounter notes, upcoming appointments, etc.  Non-urgent messages can be sent to your provider as well.   To learn more about what you can do with MyChart, go to [email or web address].

## 2024-07-26 NOTE — Progress Notes (Signed)
 Preoperative Dx: rhinophyma of nose  Postoperative Dx:  same  Procedure: laser to nose   Anesthesia: none  Description of Procedure:  Risks and complications were explained to the patient. Consent was confirmed and signed. Eye protection was placed. Time out was called and all information was confirmed to be correct. The area  area was prepped with alcohol and wiped dry. The TRL laser was set at 50-60. The nose was lasered. The patient tolerated the procedure well and there were no complications. The patient is to follow up in 4 weeks. Pronox was used.

## 2024-07-27 ENCOUNTER — Ambulatory Visit: Payer: Self-pay | Admitting: Cardiology

## 2024-07-27 ENCOUNTER — Other Ambulatory Visit (HOSPITAL_COMMUNITY): Payer: Self-pay

## 2024-07-27 LAB — BASIC METABOLIC PANEL WITH GFR
BUN/Creatinine Ratio: 18 (ref 10–24)
BUN: 16 mg/dL (ref 8–27)
CO2: 24 mmol/L (ref 20–29)
Calcium: 9.3 mg/dL (ref 8.6–10.2)
Chloride: 100 mmol/L (ref 96–106)
Creatinine, Ser: 0.91 mg/dL (ref 0.76–1.27)
Glucose: 116 mg/dL — ABNORMAL HIGH (ref 70–99)
Potassium: 4.3 mmol/L (ref 3.5–5.2)
Sodium: 140 mmol/L (ref 134–144)
eGFR: 91 mL/min/1.73 (ref >59)

## 2024-08-01 ENCOUNTER — Other Ambulatory Visit: Payer: Self-pay | Admitting: Plastic Surgery

## 2024-08-04 ENCOUNTER — Encounter (HOSPITAL_COMMUNITY): Payer: Self-pay

## 2024-08-06 NOTE — Progress Notes (Unsigned)
 Follow Up Note  RE: George Montgomery MRN: [id number] DOB: [birth year] Date of Office Visit: 08/07/2024  Referring provider: Lendia Boby LITTIE, NP-C Primary care provider: Lendia Boby LITTIE, NP-C  Chief Complaint: No chief complaint on file.  History of Present Illness: I had the pleasure of seeing George Montgomery for a follow [facility]  on 08/07/[facility]24. He is a 68 y.o. male, who is being followed for allergic reactions, food allergy , hymenoptera allergy  on VIT, asthma, allergic rhinitis, penicillin allergy  and recurrent infections. His previous allergy  office visit was on 05/31/2024 with Dr. Luke. Today is a regular follow up visit.  Discussed the use of AI scribe software for clinical note transcription with the patient, who gave verbal consent to proceed.  History of Present Illness            2025 labs: Alpha gal was positive - continue to avoid all mammalian meat. [place] nut was positive. Other tree nuts and peanuts were borderline positive.  Normal tryptase level - checks for mast cell issues. Fire  ant was positive.   Continue strict avoidance of mammalian meat, and tree nuts for now. Okay to eat peanuts only.   Avoid fire  ants - we can discuss starting on fire  ant shots as well.    Anymore reactions since the last visit? I'm concerned whether your last reaction was a combination of fire  ant stings and eating mammalian meat on the same day.    Keep October appointment to discuss further.  Assessment and Plan: George Montgomery is a 68 y.o. male with: Allergic reaction, subsequent encounter Allergy  to alpha-gal Past history - anaphylactic reaction requiring IM epi x 2 of uncertain cause; possible alpha-gal syndrome or insect/ant bite. Interim history - 2025 labs positive to alpha gal, [place] nuts, other tree nuts/peanuts borderline, positive to fire  ant. Normal tryptase level.  For mild symptoms you can take over the counter antihistamines (zyrtec  10mg   to 20mg ) and monitor symptoms closely.  If symptoms worsen or if you have severe symptoms including breathing issues, throat closure, significant swelling, whole body hives, severe diarrhea and vomiting, lightheadedness then use epinephrine  and seek immediate medical care afterwards. Emergency action in place.  Avoid mammalian meat - no beef, pork, lamb.    Anaphylactic reaction due to food, subsequent encounter Past history - Upper body itching after eating mixed nuts in December 2023. Has eaten peanut  butter since then with no issues. 2023 skin testing positive to [place] nuts. 2023 bloodwork positive to [place] nuts, borderline to walnuts, pecan and pistachio.  More likely to have anaphylactic reaction to [place] nuts, walnuts. 2025 labs positive to [place] nuts, other tree nuts/peanuts borderline. Alpha gal allergy .  Continue strict avoidance of tree nuts and peanuts. For mild symptoms you can take over the counter antihistamines (zyrtec  10mg  to 20mg ) and monitor symptoms closely.  If symptoms worsen or if you have severe symptoms including breathing issues, throat closure, significant swelling, whole body hives, severe diarrhea and vomiting, lightheadedness then use epinephrine  and seek immediate medical care afterwards. Emergency action plan in place.   Hymenoptera allergy  Past history - Went to ER few times after stings due to hives and swelling. Never required Epi. 2023 bloodwork positive to honeybee, white faced hornet, yellow jacket, wasp, yellow hornet and bumblebee. Started VIT on 12/30/2022 (MV+HB+W). Continue to avoid. Continue venom injections.    Moderate persistent asthma without complication Past history - Gets wheezing, coughing and shortness of breath mainly during URIs. Uses albuterol   rarely but during the last URI needed to add on Trelegy for 2 weeks. 2023 CXR unremarkable. 2023 spirometry showed: some restrictive disease (most likely due to body habitus) with no improvement in  FEV1 post bronchodilator treatment. Clinically feeling slightly improved.  Daily controller medication(s): Trelegy 200mcg 1 puff once a day and rinse mouth after each use.  May use albuterol  rescue inhaler 2 puffs or nebulizer every 4 to 6 hours as needed for shortness of breath, chest tightness, coughing, and wheezing. May use albuterol  rescue inhaler 2 puffs 5 to 15 minutes prior to strenuous physical activities. Monitor frequency of use - if you need to use it more than twice per week on a consistent basis let us  know.    Seasonal and perennial allergic rhinitis Past history - Perennial rhinoconjunctivitis symptoms for many years mainly in the spring and fall.  Tried Singulair , Zyrtec  and Flonase  with some benefit.  No prior ENT evaluation.  2023 CT sinus showed mild mucosal thickening. 2023 skin testing positive to dust mites, mold and cockroach. 2023 bloodwork positive to dust mites, grass, ragweed.  Borderline to cat, cockroach, tree, weed pollen. Started AIT on 02/14/2024 (G-W-RW-T & M-DM-C-CR). Allergic reaction to blue 0.5cc last week requiring epinephrine .  Interim history - symptomatic.  Continue environmental control measures. Continue Singulair  (montelukast ) 10mg  daily at night. Use over the counter antihistamines such as Zyrtec  (cetirizine ), Claritin (loratadine), Allegra  (fexofenadine ), or Xyzal (levocetirizine) daily as needed. May take twice a day during allergy  flares. May switch antihistamines every few months. Use Flonase  (fluticasone ) nasal spray 1-2 sprays per nostril once a day as needed for nasal congestion.  Nasal saline spray (i.e., Simply Saline) or nasal saline lavage (i.e., NeilMed) is recommended as needed and prior to medicated nasal sprays. Use azelastine  or ipratropium nasal spray 1-2 sprays per nostril twice a day as needed for runny nose/drainage. Allergy  injections.  Will re-assess about restarting in the winter months.    Penicillin allergy  Consider penicillin  allergy  skin testing and in office drug challenge in the future.    Recurrent infections Keep track of infections and antibiotics use. Assessment and Plan              No follow-ups on file.  No orders of the defined types were placed in this encounter.  Lab Orders  No laboratory test(s) ordered today    Diagnostics: Spirometry:  Tracings reviewed. His effort: {Blank single:19197::Good reproducible efforts.,It was hard to get consistent efforts and there is a question as to whether this reflects a maximal maneuver.,Poor effort, data can not be interpreted.} FVC: ***L FEV1: ***L, ***% predicted FEV1/FVC ratio: ***% Interpretation: {Blank single:19197::Spirometry consistent with mild obstructive disease,Spirometry consistent with moderate obstructive disease,Spirometry consistent with severe obstructive disease,Spirometry consistent with possible restrictive disease,Spirometry consistent with mixed obstructive and restrictive disease,Spirometry uninterpretable due to technique,Spirometry consistent with normal pattern,No overt abnormalities noted given today's efforts}.  Please see scanned spirometry results for details.  Results discussed with patient/family.   Medication List:  Current Outpatient Medications  Medication Sig Dispense Refill   albuterol  (VENTOLIN  HFA) 108 (90 Base) MCG/ACT inhaler Inhale 2 puffs into the lungs every 4 (four) hours as needed for wheezing or shortness of breath (coughing fits). 18 g 1   aluminum  chloride (DRYSOL) 20 % external solution Apply topically at bedtime. 35 mL 0   aspirin EC 81 MG tablet Take 1 tablet (81 mg total) by mouth daily. Swallow whole.     benzonatate  (TESSALON ) 100 MG capsule Take 1 capsule (100  mg total) by mouth every 8 (eight) hours. 21 capsule 0   EPINEPHrine  (EPIPEN  2-PAK) 0.3 mg/0.3 mL IJ SOAJ injection Inject 0.3 mg into the muscle as needed for anaphylaxis. 8 each 2   escitalopram  (LEXAPRO ) 10 MG  tablet Take  1 tablet  Daily for Mood                                                              /                                                                   TAKE                                         BY                                                 MOUTH 90 tablet 3   fenofibrate  (TRICOR ) 145 MG tablet Take 1 tablet (145 mg total) by mouth daily.     fluticasone  (FLONASE ) 50 MCG/ACT nasal spray Place 1 spray into both nostrils 2 (two) times daily as needed (nasal congestion). 16 g 5   Fluticasone -Umeclidin-Vilant (TRELEGY ELLIPTA ) 200-62.5-25 MCG/ACT AEPB Inhale 1 puff into the lungs daily. Rinse mouth after each use. 60 each 5   ipratropium (ATROVENT ) 0.06 % nasal spray Use 1 to 2 sprays each nostril 2 to 3 x /day as needed 45 mL 3   metoprolol tartrate (LOPRESSOR) 100 MG tablet Take 100 mg 2 hours before Coronary CT 1 tablet 0   montelukast  (SINGULAIR ) 10 MG tablet Take  1 tablet  Daily for Allergies                               /                                                                   TAKE                                         BY                                                 MOUTH ONCE ? ? ?  DAILY (Patient not taking: Reported on 07/26/2024) 90 tablet  3   omeprazole  (PRILOSEC) 20 MG capsule Take  1 capsule  Daily to Prevent Heartburn & Indigestion                                   /                                                                   TAKE                                         BY                                                 MOUTH 90 capsule 3   ondansetron  (ZOFRAN ) 4 MG tablet Take 1 tablet (4 mg total) by mouth daily as needed for nausea or vomiting. 30 tablet 1   prochlorperazine  (COMPAZINE ) 10 MG tablet Take 1 tablet (10 mg total) by mouth every 6 (six) hours as needed for nausea or vomiting. 20 tablet 0   rosuvastatin (CRESTOR) 10 MG tablet Take 1 tablet (10 mg total) by mouth daily. 90 tablet 3   sildenafil  (VIAGRA ) 100 MG tablet Take   1/2 to 1  tablet   Daily  as needed for XXXX 30 tablet 1   tamsulosin  (FLOMAX ) 0.4 MG CAPS capsule TAKE 1 CAPSULE BY MOUTH AT BEDTIME FOR PROSTATE 90 capsule 0   traZODone  (DESYREL ) 50 MG tablet TAKE 1 TABLET BY MOUTH EVERYDAY AT BEDTIME 90 tablet 0   valsartan -hydrochlorothiazide  (DIOVAN -HCT) 80-12.5 MG tablet Take 1 tablet by mouth daily. for blood pressure 90 tablet 3   Zinc 50 MG TABS Take by mouth.     Current Facility-Administered Medications  Medication Dose Route Frequency Provider Last Rate Last Admin   ipratropium-albuterol  (DUONEB) 0.5-2.5 (3) MG/3ML nebulizer solution 3 mL  3 mL Nebulization Once        Allergies: Allergies  Allergen Reactions   Bee Venom Anaphylaxis   Pine Anaphylaxis   Peanut -Containing Drug Products Itching and Other (See Comments)   [place] Nut (Berthollefia Excelsa)     pruritus   Gabapentin  Other (See Comments)   Lisinopril  Other (See Comments)    Patient intolerant of drug which caused his tongue to tingle.   Penicillins Itching   Celebrex  [Celecoxib ]    I reviewed his past medical history, social history, family history, and environmental history and no significant changes have been reported from his previous visit.  Review of Systems  Constitutional:  Negative for appetite change, chills, fever and unexpected weight change.  HENT:  Negative for congestion and rhinorrhea.   Eyes:  Negative for itching.  Respiratory:  Negative for cough, chest tightness, shortness of breath and wheezing.   Cardiovascular:  Negative for chest pain.  Gastrointestinal:  Negative for abdominal pain.  Genitourinary:  Negative for difficulty urinating.  Skin:  Negative for rash.  Allergic/Immunologic: Positive for environmental allergies and food allergies.  Neurological:  Negative for  headaches.    Objective: There were no vitals taken for this visit. There is no height or weight on file to calculate BMI. Physical Exam Vitals and nursing note reviewed.  Constitutional:       Appearance: Normal appearance. He is well-developed. He is obese.  HENT:     Head: Normocephalic and atraumatic.     Right Ear: Tympanic membrane and external ear normal.     Left Ear: Tympanic membrane and external ear normal.     Nose: Nose normal.     Mouth/Throat:     Mouth: Mucous membranes are moist.     Pharynx: Oropharynx is clear.  Eyes:     Conjunctiva/sclera: Conjunctivae normal.  Cardiovascular:     Rate and Rhythm: Normal rate and regular rhythm.     Heart sounds: Normal heart sounds. No murmur heard. Pulmonary:     Effort: Pulmonary effort is normal.     Breath sounds: Normal breath sounds. No wheezing, rhonchi or rales.  Musculoskeletal:     Cervical back: Neck supple.  Skin:    General: Skin is warm.     Findings: No rash.  Neurological:     Mental Status: He is alert and oriented to person, place, and time.  Psychiatric:        Behavior: Behavior normal.    Previous notes and tests were reviewed. The plan was reviewed with the patient/family, and all questions/concerned were addressed.  It was my pleasure to see George Montgomery today and participate in his care. Please feel free to contact me with any questions or concerns.  Sincerely,  Orlan Cramp, DO Allergy  & Immunology  [facility]  [place] office: [phone] [facility] office: [phone]

## 2024-08-07 ENCOUNTER — Telehealth: Payer: Self-pay | Admitting: Cardiology

## 2024-08-07 ENCOUNTER — Ambulatory Visit

## 2024-08-07 ENCOUNTER — Other Ambulatory Visit: Payer: Self-pay

## 2024-08-07 ENCOUNTER — Ambulatory Visit: Admitting: Allergy

## 2024-08-07 ENCOUNTER — Encounter: Payer: Self-pay | Admitting: Allergy

## 2024-08-07 VITALS — BP 124/60 | HR 70 | Temp 98.2°F | Resp 19

## 2024-08-07 DIAGNOSIS — J302 Other seasonal allergic rhinitis: Secondary | ICD-10-CM

## 2024-08-07 DIAGNOSIS — J4540 Moderate persistent asthma, uncomplicated: Secondary | ICD-10-CM

## 2024-08-07 DIAGNOSIS — Z880 Allergy status to penicillin: Secondary | ICD-10-CM

## 2024-08-07 DIAGNOSIS — J3089 Other allergic rhinitis: Secondary | ICD-10-CM | POA: Diagnosis not present

## 2024-08-07 DIAGNOSIS — Z91038 Other insect allergy status: Secondary | ICD-10-CM | POA: Diagnosis not present

## 2024-08-07 DIAGNOSIS — T7800XD Anaphylactic reaction due to unspecified food, subsequent encounter: Secondary | ICD-10-CM

## 2024-08-07 DIAGNOSIS — Z91018 Allergy to other foods: Secondary | ICD-10-CM | POA: Diagnosis not present

## 2024-08-07 DIAGNOSIS — T7840XD Allergy, unspecified, subsequent encounter: Secondary | ICD-10-CM

## 2024-08-07 DIAGNOSIS — B999 Unspecified infectious disease: Secondary | ICD-10-CM

## 2024-08-07 MED ORDER — TRELEGY ELLIPTA 200-62.5-25 MCG/ACT IN AEPB: 1.0000 | INHALATION_SPRAY | Freq: Every day | RESPIRATORY_TRACT | 5 refills | Status: AC

## 2024-08-07 NOTE — Progress Notes (Deleted)
 Referring Physician:  Lendia Boby CROME, NP-C [address] [place],  [place] [address]  Primary Physician:  Lendia Boby CROME, NP-C  History of Present Illness: 08/07/2024 [name] is here today with a chief complaint of ***  Neuropathy. Pain? Severe right foot numbness causing fall.  Tingling? Weakness?  Duration: *** Location: *** Quality: *** Severity: ***  Precipitating: aggravated by *** Modifying factors: made better by *** Weakness: none Timing: *** Bowel/Bladder Dysfunction: none  Conservative measures:  Physical therapy: *** Has not participated in? Multimodal medical therapy including regular antiinflammatories: *** acetaminophen , oxycodone , prednisone   Injections: *** epidural steroid injections?  Past Surgery: ***  George Montgomery has ***no symptoms of cervical myelopathy.  The symptoms are causing a significant impact on the patient's life.   Review of Systems:  A 10 point review of systems is negative, except for the pertinent positives and negatives detailed in the HPI.  Past Medical History: Past Medical History:  Diagnosis Date   Arthritis    Asthma    Back pain    Chronic pain of both shoulders    Depression    Dyspnea    Elevated glucose    Environmental allergies    GERD (gastroesophageal reflux disease)    HLD (hyperlipidemia)    HTN (hypertension)    Insomnia    Obesity    OSA on CPAP 08/01/2014   Reflux    Trigger finger     Past Surgical History: Past Surgical History:  Procedure Laterality Date   APPENDECTOMY  07/2012   bone spur removal     HEMORRHOID SURGERY  09/2012   HERNIA REPAIR      Allergies: Allergies as of 08/14/2024 - Review Complete 08/07/2024  Allergen Reaction Noted   Bee venom Anaphylaxis 06/18/2016   Pine Anaphylaxis 05/07/2020   Peanut -containing drug products Itching and Other (See Comments) 10/31/2021   Alpha-gal  08/06/2024   [place] nut (berthollefia excelsa)  02/24/2022    Gabapentin  Other (See Comments) 12/01/2023   Lisinopril  Other (See Comments) 03/04/2016   Penicillins Itching 08/11/2022   Singulair  [montelukast ]  08/07/2024   Celebrex  [celecoxib ]  11/22/2017    Medications: Outpatient Encounter Medications as of 08/14/2024  Medication Sig   albuterol  (VENTOLIN  HFA) 108 (90 Base) MCG/ACT inhaler Inhale 2 puffs into the lungs every 4 (four) hours as needed for wheezing or shortness of breath (coughing fits).   aluminum  chloride (DRYSOL) 20 % external solution Apply topically at bedtime.   aspirin EC 81 MG tablet Take 1 tablet (81 mg total) by mouth daily. Swallow whole.   benzonatate  (TESSALON ) 100 MG capsule Take 1 capsule (100 mg total) by mouth every 8 (eight) hours.   EPINEPHrine  (EPIPEN  2-PAK) 0.3 mg/0.3 mL IJ SOAJ injection Inject 0.3 mg into the muscle as needed for anaphylaxis.   escitalopram  (LEXAPRO ) 10 MG tablet Take  1 tablet  Daily for Mood                                                              /  TAKE                                         BY                                                 MOUTH   fenofibrate  (TRICOR ) 145 MG tablet Take 1 tablet (145 mg total) by mouth daily.   fluticasone  (FLONASE ) 50 MCG/ACT nasal spray Place 1 spray into both nostrils 2 (two) times daily as needed (nasal congestion).   Fluticasone -Umeclidin-Vilant (TRELEGY ELLIPTA ) 200-62.5-25 MCG/ACT AEPB Inhale 1 puff into the lungs daily. Rinse mouth after each use.   ipratropium (ATROVENT ) 0.06 % nasal spray Use 1 to 2 sprays each nostril 2 to 3 x /day as needed   metoprolol tartrate (LOPRESSOR) 100 MG tablet Take 100 mg 2 hours before Coronary CT   omeprazole  (PRILOSEC) 20 MG capsule Take  1 capsule  Daily to Prevent Heartburn & Indigestion                                   /                                                                   TAKE                                         BY                                                  MOUTH   ondansetron  (ZOFRAN ) 4 MG tablet Take 1 tablet (4 mg total) by mouth daily as needed for nausea or vomiting.   prochlorperazine  (COMPAZINE ) 10 MG tablet Take 1 tablet (10 mg total) by mouth every 6 (six) hours as needed for nausea or vomiting.   rosuvastatin (CRESTOR) 10 MG tablet Take 1 tablet (10 mg total) by mouth daily.   sildenafil  (VIAGRA ) 100 MG tablet Take   1/2 to 1 tablet   Daily  as needed for XXXX   tamsulosin  (FLOMAX ) 0.4 MG CAPS capsule TAKE 1 CAPSULE BY MOUTH AT BEDTIME FOR PROSTATE   traZODone  (DESYREL ) 50 MG tablet TAKE 1 TABLET BY MOUTH EVERYDAY AT BEDTIME   valsartan -hydrochlorothiazide  (DIOVAN -HCT) 80-12.5 MG tablet Take 1 tablet by mouth daily. for blood pressure   Zinc 50 MG TABS Take by mouth.   Facility-Administered Encounter Medications as of 08/14/2024  Medication   ipratropium-albuterol  (DUONEB) 0.5-2.5 (3) MG/3ML nebulizer solution 3 mL    Social History: Social History   Tobacco Use   Smoking status: Former    Current packs/day: 0.00    Average packs/day: 1 pack/day for 40.0 years (40.0 ttl pk-yrs)    Types:  Cigarettes    Start date: 12/03/1972    Quit date: 12/03/2012    Years since quitting: 11.6   Smokeless tobacco: Never  Vaping Use   Vaping status: Never Used  Substance Use Topics   Alcohol use: Yes    Alcohol/week: 1.0 standard drink of alcohol    Types: 1 Glasses of wine per week    Comment: occ   Drug use: No    Family Medical History: Family History  Problem Relation Age of Onset   Heart disease Mother    Macular degeneration Mother    Hypertension Mother    Hyperlipidemia Mother    Depression Mother    Anxiety disorder Mother    Liver disease Father    Alcoholism Father    Snoring Father    Hypertension Brother    Allergic rhinitis Brother    Asthma Brother    Sinusitis Brother    Bronchitis Brother    Sleep apnea Brother    Stroke Maternal Aunt    Snoring Maternal Uncle    Cancer -  Colon Paternal Uncle    Cancer - Prostate Paternal Uncle    Cancer - Colon Other    Cancer - Prostate Other    Immunodeficiency Neg Hx    Urticaria Neg Hx    Eczema Neg Hx    Angioedema Neg Hx     Physical Examination: @VITALWITHPAIN @  General: Patient is well developed, well nourished, calm, collected, and in no apparent distress. Attention to examination is appropriate.  Psychiatric: Patient is non-anxious.  Head:  Pupils equal, round, and reactive to light.  ENT:  Oral mucosa appears well hydrated.  Neck:   Supple.  ***Full range of motion.  Respiratory: Patient is breathing without any difficulty.  Extremities: No edema.  Vascular: Palpable dorsal pedal pulses.  Skin:   On exposed skin, there are no abnormal skin lesions.  NEUROLOGICAL:     Awake, alert, oriented to person, place, and time.  Speech is clear and fluent. Fund of knowledge is appropriate.   Cranial Nerves: Pupils equal round and reactive to light.  Facial tone is symmetric.  Facial sensation is symmetric.  ROM of spine: ***full.  Palpation of spine: ***non tender.    Strength: Side Biceps Triceps Deltoid Interossei Grip Wrist Ext. Wrist Flex.  R 5 5 5 5 5 5 5   L 5 5 5 5 5 5 5    Side Iliopsoas Quads Hamstring PF DF EHL  R 5 5 5 5 5 5   L 5 5 5 5 5 5    Reflexes are ***2+ and symmetric at the biceps, triceps, brachioradialis, patella and achilles.   Hoffman's is absent.  Clonus is not present.  Toes are down-going.  Bilateral upper and lower extremity sensation is intact to light touch.    Gait is normal.   No difficulty with tandem gait.   No evidence of dysmetria noted.  Medical Decision Making  Imaging: ***  I have personally reviewed the images and agree with the above interpretation.  Assessment and Plan: George Montgomery is a pleasant 68 y.o. male with ***    Thank you for involving me in the care of this patient.   I spent a total of *** minutes in both face-to-face and  non-face-to-face activities for this visit on the date of this encounter.   Lyle Decamp, PA-C Dept. of Neurosurgery

## 2024-08-07 NOTE — Patient Instructions (Addendum)
 Allergic reaction/alpha gal allergy  For mild symptoms you can take over the counter antihistamines (zyrtec  10mg  to 20mg ) and monitor symptoms closely.  If symptoms worsen or if you have severe symptoms including breathing issues, throat closure, significant swelling, whole body hives, severe diarrhea and vomiting, lightheadedness then use epinephrine  and seek immediate medical care afterwards. Emergency action in place.  Avoid mammalian meat - no beef, pork, lamb.   Breathing Daily controller medication(s): Trelegy 200mcg 1 puff once a day and rinse mouth after each use.  May use albuterol  rescue inhaler 2 puffs or nebulizer every 4 to 6 hours as needed for shortness of breath, chest tightness, coughing, and wheezing. May use albuterol  rescue inhaler 2 puffs 5 to 15 minutes prior to strenuous physical activities. Monitor frequency of use - if you need to use it more than twice per week on a consistent basis let us  know.  Breathing control goals:  Full participation in all desired activities (may need albuterol  before activity) Albuterol  use two times or less a week on average (not counting use with activity) Cough interfering with sleep two times or less a month Oral steroids no more than once a year No hospitalizations  Food 2023 testing positive to [place] nuts, borderline to walnuts, pecan and pistachio.  More likely to have anaphylactic reaction to [place] nuts, walnuts. Continue strict avoidance of tree nuts and peanuts. For mild symptoms you can take over the counter antihistamines (zyrtec  10mg  to 20mg ) and monitor symptoms closely.  If symptoms worsen or if you have severe symptoms including breathing issues, throat closure, significant swelling, whole body hives, severe diarrhea and vomiting, lightheadedness then use epinephrine  and seek immediate medical care afterwards. Emergency action plan in place.  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 Continue to avoid. Continue venom injections - given today.   Environmental allergies 2023 skin testing positive to dust mites, mold and cockroach. 2023 bloodwork positive to dust mites, grass, ragweed.  Borderline to cat, cockroach, tree, weed pollen. Continue environmental control measures. Use over the counter antihistamines such as Zyrtec  (cetirizine ), Claritin (loratadine), Allegra  (fexofenadine ), or Xyzal (levocetirizine) daily as needed. May take twice a day during allergy  flares. May switch antihistamines every few months. Use Flonase  (fluticasone ) nasal spray 1-2 sprays per nostril once a day as needed for nasal congestion.  Nasal saline spray (i.e., Simply Saline) or nasal saline lavage (i.e., NeilMed) is recommended as needed and prior to medicated nasal sprays. Use azelastine  or ipratropium nasal spray 1-2 sprays per nostril twice a day as needed for runny nose/drainage. Start environmental allergy  shots + fire  ant in January.   Infections Keep track of infections and antibiotics use.  Penicillin allergy : Consider penicillin allergy  skin testing and in office drug challenge in the future.   Follow up in 4 months or sooner if needed. Make first shot appointment for environmental + fire  ant in January.   Alpha-gal and Red Meat Allergy    Overview An allergy  to "alpha-gal" refers to having a severe and potentially life-threatening allergy  to a carbohydrate molecule called galactose-alpha-1,3-galactose that is found in most mammalian or "red meat". Unlike other food allergies which typically occur within minutes of ingestion, symptoms from eating red meat such as pork, lamb or beef may be delayed, occurring 3-8 hours after eating. Most food allergies are directed against a protein molecule, but alpha-gal is unusual because it is a carbohydrate, and a delay in its absorption may explain the delay  in symptoms.  Helpful  website: [employer]iker[employer]estival[employer]is  What are the symptoms of an alpha-gal allergy ? As with other food allergies, signs or symptoms of an allergy  to alpha-gal may include: Hives and itching  Swelling of your lips, face or eyelids  Shortness of breath, cough or wheezing  Abdominal pain, nausea, diarrhea or vomiting The most severe reaction, anaphylaxis, can present as a combination of several of these symptoms, may include low blood pressure, and is potentially fatal.  Because these symptoms are delayed, you may only wake up with them in the middle of the night after an evening meal.  How is an alpha-gal allergy  diagnosed? Diagnosis of this allergy  starts with your allergist taking an appropriate history and physical examination. Because the onset is usually quite delayed, it can be hard to associate the symptoms with eating red meat many hours previously. Triggers include any red meat - including beef, pork, lamb or even horse products. It may occur after eating hotdogs and hamburgers. In very rare cases the reaction may extend to milk or dairy proteins and gelatin.  Your allergist may recommend testing that includes skin tests to the relevant animal proteins and blood tests which measure the levels of a specific immunoglobulin E (IgE) antibody, to mammalian meats. An investigational blood test, IgE against alpha-gal itself, may also aid in the diagnosis.  How is an alpha-gal allergy  treated? Immediate symptoms such as hives or shortness of breath are treated the same as any other food allergy  - in an urgent care setting with anti-histamines, epinephrine  and other medications. Prevention long-term involves avoidance of all red meat in sensitized individuals. You may be advised to carry an epinephrine  auto-injector, to be used in case of subsequent accidental exposures and reaction. These measures do not necessarily mean switching to a full vegetarian diet, since poultry and fish can be consumed and  do not cause similar reactions. As with other food allergies, there is the possibility that over time the sensitivity diminishes - although these changes may take many years to become apparent.  How do you become allergic to alpha-gal? Alpha-gal is a molecule carried in the saliva of the [place] tick and other potential arthropods typically after feeding on mammalian blood. People that are bitten by the tick, especially those that are bitten repeatedly, are at risk of becoming sensitized and producing the IgE necessary to then cause allergic reactions. Interestingly, allergic reactions may occur to red meat, to subsequent tick bites, and even to medications that contain alpha-gal. Cetuximab is a cancer medication that contains alpha-gal, and people who have had allergic reactions to this medication (these are typically immediate reactions, because it is infused intravenously) have a higher risk for red meat allergy  and are likely to have been bitten by ticks in the past. As might be expected, the incidence of tick bites is much higher in the [place] the traditional habitat for the tick. However, cases are now increasingly reported in the [place] and [place]. And it is a phenomenon that has been observed worldwide, with different ticks responsible for similar cases of red meat allergy  in many other countries such as [place], [place] and [place].  The discovery of this peculiar allergy  has allowed researchers to correlate tick bites with many cases of anaphylaxis that would previously have been classified as 'idiopathic', or of unknown cause. Also, while it was originally thought that the [facility] tick had to feast on mammalian blood in order to carry the alpha-gal molecule,  more recent research has shown that it may carry this molecule and be capable of sensitizing humans independently.  How do you prevent an alpha-gal allergy ? Because this allergy  is predominantly tick  born, you are more likely at risk if you often go outdoors in wooded areas for activities such as hiking, fishing or hunting. The key strategy is to prevent tick bites. This may include wearing long sleeved shirts or pants, using appropriate insect repellants, and surveying for ticks after spending time outdoors. Any observed ticks should be removed carefully by cleaning the site with rubbing alcohol, then using tweezers to pull the tick's head up carefully from the skin using steady pressure. Clean your hands and the site one more time and make sure not to crush the tick between your fingers.

## 2024-08-07 NOTE — Telephone Encounter (Signed)
 Spoke with pt and let him know that because he doesn't have a contrast allergy , he doesn't need to do the premedication of Prednisone  and Benadryl . Pt verbalized understanding and had no further questions.

## 2024-08-07 NOTE — Telephone Encounter (Signed)
 Pt c/o medication issue:  1. Name of Medication:  Prednisone    2. How are you currently taking this medication (dosage and times per day)?   3. Are you having a reaction (difficulty breathing--STAT)?   4. What is your medication issue?   Patient says he was going over instructions for CT [facility] and read that he is supposed to take Prednisone  prior. However, he states he doesn't have any Prednisone . Please advise.

## 2024-08-08 ENCOUNTER — Ambulatory Visit

## 2024-08-08 ENCOUNTER — Ambulatory Visit (HOSPITAL_COMMUNITY)
Admission: RE | Admit: 2024-08-08 | Discharge: 2024-08-08 | Disposition: A | Discharge status: Home / Self Care | Source: Ambulatory Visit | Attending: Cardiology | Admitting: Cardiology

## 2024-08-08 DIAGNOSIS — I10 Essential (primary) hypertension: Secondary | ICD-10-CM | POA: Insufficient documentation

## 2024-08-08 DIAGNOSIS — I2510 Atherosclerotic heart disease of native coronary artery without angina pectoris: Secondary | ICD-10-CM | POA: Diagnosis not present

## 2024-08-08 DIAGNOSIS — I700 Atherosclerosis of aorta: Secondary | ICD-10-CM | POA: Insufficient documentation

## 2024-08-08 DIAGNOSIS — R072 Precordial pain: Secondary | ICD-10-CM | POA: Diagnosis not present

## 2024-08-08 DIAGNOSIS — E782 Mixed hyperlipidemia: Secondary | ICD-10-CM | POA: Insufficient documentation

## 2024-08-08 MED ORDER — IOHEXOL 350 MG/ML SOLN
100.0000 mL | Freq: Once | INTRAVENOUS | Status: AC | PRN
Start: 2024-08-08 — End: 2024-08-08
  Administered 2024-08-08: 100 mL via INTRAVENOUS

## 2024-08-08 MED ORDER — NITROGLYCERIN 0.4 MG SL SUBL
0.8000 mg | SUBLINGUAL_TABLET | Freq: Once | SUBLINGUAL | Status: AC
  Administered 2024-08-08: 0.8 mg via SUBLINGUAL

## 2024-08-09 ENCOUNTER — Ambulatory Visit (HOSPITAL_BASED_OUTPATIENT_CLINIC_OR_DEPARTMENT_OTHER)
Admission: RE | Admit: 2024-08-09 | Discharge: 2024-08-09 | Disposition: A | Discharge status: Home / Self Care | Source: Ambulatory Visit | Attending: Internal Medicine | Admitting: Internal Medicine

## 2024-08-09 ENCOUNTER — Ambulatory Visit: Payer: Self-pay | Admitting: Internal Medicine

## 2024-08-09 ENCOUNTER — Other Ambulatory Visit: Payer: Self-pay | Admitting: Internal Medicine

## 2024-08-09 DIAGNOSIS — R931 Abnormal findings on diagnostic imaging of heart and coronary circulation: Secondary | ICD-10-CM

## 2024-08-09 DIAGNOSIS — E782 Mixed hyperlipidemia: Secondary | ICD-10-CM | POA: Diagnosis not present

## 2024-08-09 DIAGNOSIS — R072 Precordial pain: Secondary | ICD-10-CM | POA: Diagnosis not present

## 2024-08-09 DIAGNOSIS — I10 Essential (primary) hypertension: Secondary | ICD-10-CM | POA: Diagnosis not present

## 2024-08-09 DIAGNOSIS — I2510 Atherosclerotic heart disease of native coronary artery without angina pectoris: Secondary | ICD-10-CM | POA: Diagnosis not present

## 2024-08-09 DIAGNOSIS — I700 Atherosclerosis of aorta: Secondary | ICD-10-CM | POA: Diagnosis not present

## 2024-08-09 NOTE — Progress Notes (Signed)
CT sent for FFR 

## 2024-08-10 NOTE — Progress Notes (Signed)
 Aeroallergen Immunotherapy   Ordering Provider: Dr. Orlan Cramp   Patient Details  Name: George Montgomery  MRN: [id number]  Date of Birth: 01-14-1956   Order 1 of 3   Vial Label: G-W-RW-T   0.3 ml (Volume)  BAU Concentration -- 7 Grass Mix* 100,000 ([place]  Blue, [place], [place], Perennial [place], RedTop, Sweet Vernal, Timothy)  0.2 ml (Volume)  1:20 Concentration -- [place]  0.3 ml (Volume)  BAU Concentration -- [place] 10,000  0.2 ml (Volume)  1:20 Concentration -- [place]  0.3 ml (Volume)  1:20 Concentration -- Ragweed Mix  0.2 ml (Volume)  1:10 Concentration -- Sheep Sorrell*  0.3 ml (Volume)  1:10 Concentration -- [place], [place] mix*    1.8  ml Extract Subtotal  3.2  ml Diluent   5.0  ml Maintenance Total   Schedule:  B  Silver Vial (1:1,000,000): Schedule B (6 doses)  Blue Vial (1:100,000): Schedule B (6 doses)  Yellow Vial (1:10,000): Schedule B (6 doses)  Green Vial (1:1,000): Schedule B (6 doses)  Red Vial (1:100): Schedule A (14 doses)   Special Instructions: May come in 1-2 times a week during build up as tolerated. Once a week on red vial.  Once on red vial #1 0.5cc go every 2 weeks, on red vial #2 0.5cc go every 4 weeks. May build up red vials faster (0.1, 0.3, 0.5).   *Use this immunotherapy recipe.

## 2024-08-10 NOTE — Progress Notes (Signed)
 Aeroallergen Immunotherapy  Ordering Provider: Dr. Orlan Cramp  Patient Details Name: George Montgomery MRN: [id number] Date of Birth: 1955-11-16  Order 3 of 3  Vial Label: FA  0.5 ml (Volume)  1:20 Concentration -- Fire  Ant   0.5  ml Extract Subtotal 4.5  ml Diluent  5.0  ml Maintenance Total  Schedule:  B Silver Vial (1:1,000,000): Schedule B (6 doses) Blue Vial (1:100,000): Schedule B (6 doses) Yellow Vial (1:10,000): Schedule B (6 doses) Green Vial (1:1,000): Schedule B (6 doses) Red Vial (1:100): Schedule A (14 doses)  Special Instructions: May come in 1-2 times a week during build up as tolerated. Once a week on red vial. Once on red vial #1 0.5cc go every 2 weeks, on red vial #2 0.5cc go every 4 weeks. May build up red vials faster (0.1, 0.3, 0.5).  *Use this immunotherapy recipe.

## 2024-08-10 NOTE — Progress Notes (Signed)
 NOT SCHEDULED, WILL MAKE WHEN APPT MADE

## 2024-08-10 NOTE — Progress Notes (Signed)
 Aeroallergen Immunotherapy  Ordering Provider: Dr. Orlan Cramp  Patient Details Name: George Montgomery MRN: [id number] Date of Birth: Aug 25, 1956  Order 2 of 3  Vial Label: M-Dm-C-Cr  0.2 ml (Volume)  1:10 Concentration -- Fusarium moniliforme 0.2 ml (Volume)  1:40 Concentration -- Aureobasidium pullulans 0.2 ml (Volume)  1:10 Concentration -- Rhizopus oryzae 0.5 ml (Volume)  1:10 Concentration -- Cat Hair 0.3 ml (Volume)  1:20 Concentration -- Cockroach, [place] 0.5 ml (Volume)   AU Concentration -- Mite Mix (DF 5,000 & DP 5,000)   1.9  ml Extract Subtotal 3.1  ml Diluent  5.0  ml Maintenance Total  Schedule:  B Silver Vial (1:1,000,000): Schedule B (6 doses) Blue Vial (1:100,000): Schedule B (6 doses) Yellow Vial (1:10,000): Schedule B (6 doses) Green Vial (1:1,000): Schedule B (6 doses) Red Vial (1:100): Schedule A (14 doses)  Special Instructions: May come in 1-2 times a week during build up as tolerated. Once a week on red vial. Once on red vial #1 0.5cc go every 2 weeks, on red vial #2 0.5cc go every 4 weeks. May build up red vials faster (0.1, 0.3, 0.5).  *Use this immunotherapy recipe.

## 2024-08-14 ENCOUNTER — Ambulatory Visit: Admitting: Physician Assistant

## 2024-08-14 NOTE — Progress Notes (Unsigned)
 Subjective:    Patient ID: Koren LITTIE Sous, male    DOB: 18-Mar-1956, 68 y.o.   MRN: [id number]     HPI Jantzen is here for follow up of his chronic medical problems.  The last 2-3 weeks -- when urinate - has steady stream and then has to urinate again and gets a trickle.  And thinks he is done and then has another trickle.  He has sit there for a while.  Has soiled himself in the past.  He has BPH and is currently on Flomax .  Needs to lose weight.  Phentermine  helps - has lost 10 lbs.  Does not help appetite -it helps more energy and keeps 10 moving more.  Exercise - 4 weeks - pool work outs with weights in water - 45 minutes,   uses treadmill, elliptical or stationary bike    Bee allergy  - venom shots   Medications and allergies reviewed with patient and updated if appropriate.  Current Outpatient Medications on File Prior to Visit  Medication Sig Dispense Refill   albuterol  (VENTOLIN  HFA) 108 (90 Base) MCG/ACT inhaler Inhale 2 puffs into the lungs every 4 (four) hours as needed for wheezing or shortness of breath (coughing fits). 18 g 1   aspirin EC 81 MG tablet Take 1 tablet (81 mg total) by mouth daily. Swallow whole.     EPINEPHrine  (EPIPEN  2-PAK) 0.3 mg/0.3 mL IJ SOAJ injection Inject 0.3 mg into the muscle as needed for anaphylaxis. 8 each 2   escitalopram  (LEXAPRO ) 10 MG tablet Take  1 tablet  Daily for Mood                                                              /                                                                   TAKE                                         BY                                                 MOUTH 90 tablet 3   fenofibrate  (TRICOR ) 145 MG tablet Take 1 tablet (145 mg total) by mouth daily.     fluticasone  (FLONASE ) 50 MCG/ACT nasal spray Place 1 spray into both nostrils 2 (two) times daily as needed (nasal congestion). 16 g 5   Fluticasone -Umeclidin-Vilant (TRELEGY ELLIPTA ) 200-62.5-25 MCG/ACT AEPB Inhale 1 puff into the lungs daily.  Rinse mouth after each use. 60 each 5   omeprazole  (PRILOSEC) 20 MG capsule Take  1 capsule  Daily to Prevent Heartburn & Indigestion                                   /  TAKE                                         BY                                                 MOUTH 90 capsule 3   phentermine  (ADIPEX-P ) 37.5 MG tablet Take 37.5 mg by mouth daily before breakfast.     pregabalin (LYRICA) 25 MG capsule Take 30 mg by mouth 2 (two) times daily.     rosuvastatin (CRESTOR) 10 MG tablet Take 1 tablet (10 mg total) by mouth daily. 90 tablet 3   sildenafil  (VIAGRA ) 100 MG tablet Take   1/2 to 1 tablet   Daily  as needed for XXXX 30 tablet 1   tamsulosin  (FLOMAX ) 0.4 MG CAPS capsule TAKE 1 CAPSULE BY MOUTH AT BEDTIME FOR PROSTATE 90 capsule 0   traZODone  (DESYREL ) 50 MG tablet TAKE 1 TABLET BY MOUTH EVERYDAY AT BEDTIME 90 tablet 0   valsartan -hydrochlorothiazide  (DIOVAN -HCT) 80-12.5 MG tablet Take 1 tablet by mouth daily. for blood pressure 90 tablet 3   ipratropium (ATROVENT ) 0.06 % nasal spray Use 1 to 2 sprays each nostril 2 to 3 x /day as needed (Patient not taking: Reported on 08/15/2024) 45 mL 3   metoprolol tartrate (LOPRESSOR) 100 MG tablet Take 100 mg 2 hours before Coronary CT (Patient not taking: Reported on 08/15/2024) 1 tablet 0   ondansetron  (ZOFRAN ) 4 MG tablet Take 1 tablet (4 mg total) by mouth daily as needed for nausea or vomiting. (Patient not taking: Reported on 08/15/2024) 30 tablet 1   prochlorperazine  (COMPAZINE ) 10 MG tablet Take 1 tablet (10 mg total) by mouth every 6 (six) hours as needed for nausea or vomiting. (Patient not taking: Reported on 08/15/2024) 20 tablet 0   Zinc 50 MG TABS Take by mouth. (Patient not taking: Reported on 08/15/2024)     Current Facility-Administered Medications on File Prior to Visit  Medication Dose Route Frequency Provider Last Rate Last Admin   ipratropium-albuterol  (DUONEB)  0.5-2.5 (3) MG/3ML nebulizer solution 3 mL  3 mL Nebulization Once          Review of Systems  Constitutional:  Negative for fever.  HENT:  Negative for trouble swallowing.   Respiratory:  Positive for cough (allergy  related) and wheezing (allergy  related - once a week). Negative for shortness of breath.   Cardiovascular:  Positive for leg swelling (minimal). Negative for chest pain and palpitations.  Gastrointestinal:  Negative for abdominal pain.       GERD controlled  Genitourinary:  Positive for urgency.  Musculoskeletal:  Positive for arthralgias (shoulder, hands).  Neurological:  Negative for light-headedness and headaches.  Psychiatric/Behavioral:  Positive for sleep disturbance.        Objective:   Vitals:   08/15/24 1046  BP: (!) 140/68  Pulse: 62  Temp: 98.7 F (37.1 C)  SpO2: 95%   BP Readings from Last 3 Encounters:  08/15/24 (!) 140/68  08/08/24 119/62  08/07/24 124/60   Wt Readings from Last 3 Encounters:  08/15/24 236 lb (107 kg)  07/26/24 235 lb (106.6 kg)  06/26/24 230 lb (104.3 kg)   Body mass index is 34.85 kg/m.    Physical Exam Constitutional:  General: He is not in acute distress.    Appearance: Normal appearance. He is not ill-appearing.  HENT:     Head: Normocephalic and atraumatic.  Eyes:     Conjunctiva/sclera: Conjunctivae normal.  Cardiovascular:     Rate and Rhythm: Normal rate and regular rhythm.     Heart sounds: Normal heart sounds.  Pulmonary:     Effort: Pulmonary effort is normal. No respiratory distress.     Breath sounds: Normal breath sounds. No wheezing or rales.  Abdominal:     General: There is no distension.     Palpations: Abdomen is soft.     Tenderness: There is no abdominal tenderness.     Hernia: A hernia (Ventral and umbilical) is present.  Musculoskeletal:     Right lower leg: No edema.     Left lower leg: No edema.  Skin:    General: Skin is warm and dry.     Findings: No rash.  Neurological:      Mental Status: He is alert. Mental status is at baseline.  Psychiatric:        Mood and Affect: Mood normal.        Lab Results  Component Value Date   WBC 7.8 04/06/2024   HGB 14.9 04/06/2024   HCT 43.6 04/06/2024   PLT 211.0 04/06/2024   GLUCOSE 116 (H) 07/26/2024   CHOL 150 04/06/2024   TRIG 213.0 (H) 04/06/2024   HDL 33.90 (L) 04/06/2024   LDLCALC 73 04/06/2024   ALT 37 04/06/2024   AST 21 04/06/2024   NA 140 07/26/2024   K 4.3 07/26/2024   CL 100 07/26/2024   CREATININE 0.91 07/26/2024   BUN 16 07/26/2024   CO2 24 07/26/2024   TSH 1.12 04/06/2024   PSA 1.12 05/10/2023   HGBA1C 6.3 04/06/2024   MICROALBUR 0.2 05/10/2023     Assessment & Plan:    See Problem List for Assessment and Plan of chronic medical problems.

## 2024-08-15 ENCOUNTER — Ambulatory Visit: Admitting: Family Medicine

## 2024-08-15 ENCOUNTER — Encounter: Payer: Self-pay | Admitting: Internal Medicine

## 2024-08-15 ENCOUNTER — Ambulatory Visit (INDEPENDENT_AMBULATORY_CARE_PROVIDER_SITE_OTHER): Admitting: Internal Medicine

## 2024-08-15 VITALS — BP 140/68 | HR 62 | Temp 98.7°F | Ht 69.0 in | Wt 236.0 lb

## 2024-08-15 DIAGNOSIS — G629 Polyneuropathy, unspecified: Secondary | ICD-10-CM

## 2024-08-15 DIAGNOSIS — F419 Anxiety disorder, unspecified: Secondary | ICD-10-CM | POA: Diagnosis not present

## 2024-08-15 DIAGNOSIS — Z91018 Allergy to other foods: Secondary | ICD-10-CM

## 2024-08-15 DIAGNOSIS — I10 Essential (primary) hypertension: Secondary | ICD-10-CM

## 2024-08-15 DIAGNOSIS — K219 Gastro-esophageal reflux disease without esophagitis: Secondary | ICD-10-CM

## 2024-08-15 DIAGNOSIS — E785 Hyperlipidemia, unspecified: Secondary | ICD-10-CM

## 2024-08-15 DIAGNOSIS — F32A Depression, unspecified: Secondary | ICD-10-CM

## 2024-08-15 DIAGNOSIS — N182 Chronic kidney disease, stage 2 (mild): Secondary | ICD-10-CM | POA: Diagnosis not present

## 2024-08-15 DIAGNOSIS — N138 Other obstructive and reflux uropathy: Secondary | ICD-10-CM

## 2024-08-15 DIAGNOSIS — E1169 Type 2 diabetes mellitus with other specified complication: Secondary | ICD-10-CM

## 2024-08-15 DIAGNOSIS — G4733 Obstructive sleep apnea (adult) (pediatric): Secondary | ICD-10-CM

## 2024-08-15 DIAGNOSIS — E559 Vitamin D deficiency, unspecified: Secondary | ICD-10-CM | POA: Diagnosis not present

## 2024-08-15 DIAGNOSIS — E1122 Type 2 diabetes mellitus with diabetic chronic kidney disease: Secondary | ICD-10-CM | POA: Diagnosis not present

## 2024-08-15 DIAGNOSIS — Z125 Encounter for screening for malignant neoplasm of prostate: Secondary | ICD-10-CM | POA: Diagnosis not present

## 2024-08-15 DIAGNOSIS — J4540 Moderate persistent asthma, uncomplicated: Secondary | ICD-10-CM

## 2024-08-15 DIAGNOSIS — E66811 Obesity, class 1: Secondary | ICD-10-CM

## 2024-08-15 DIAGNOSIS — G4709 Other insomnia: Secondary | ICD-10-CM

## 2024-08-15 LAB — COMPREHENSIVE METABOLIC PANEL WITH GFR
ALT: 26 U/L (ref 0–53)
AST: 20 U/L (ref 0–37)
Albumin: 4.6 g/dL (ref 3.5–5.2)
Alkaline Phosphatase: 68 U/L (ref 39–117)
BUN: 17 mg/dL (ref 6–23)
CO2: 29 meq/L (ref 19–32)
Calcium: 9 mg/dL (ref 8.4–10.5)
Chloride: 101 meq/L (ref 96–112)
Creatinine, Ser: 0.81 mg/dL (ref 0.40–1.50)
GFR: 89.92 mL/min (ref >60.00)
Glucose, Bld: 132 mg/dL — ABNORMAL HIGH (ref 70–99)
Potassium: 4.1 meq/L (ref 3.5–5.1)
Sodium: 139 meq/L (ref 135–145)
Total Bilirubin: 0.5 mg/dL (ref 0.2–1.2)
Total Protein: 7.1 g/dL (ref 6.0–8.3)

## 2024-08-15 LAB — CBC WITH DIFFERENTIAL/PLATELET
Basophils Absolute: 0.1 K/uL (ref 0.0–0.1)
Basophils Relative: 0.7 % (ref 0.0–3.0)
Eosinophils Absolute: 0.2 K/uL (ref 0.0–0.7)
Eosinophils Relative: 2.4 % (ref 0.0–5.0)
HCT: 43.7 % (ref 39.0–52.0)
Hemoglobin: 14.9 g/dL (ref 13.0–17.0)
Lymphocytes Relative: 29.6 % (ref 12.0–46.0)
Lymphs Abs: 2.2 K/uL (ref 0.7–4.0)
MCHC: 34 g/dL (ref 30.0–36.0)
MCV: 95.8 fl (ref 78.0–100.0)
Monocytes Absolute: 0.6 K/uL (ref 0.1–1.0)
Monocytes Relative: 7.9 % (ref 3.0–12.0)
Neutro Abs: 4.5 K/uL (ref 1.4–7.7)
Neutrophils Relative %: 59.4 % (ref 43.0–77.0)
Platelets: 204 K/uL (ref 150.0–400.0)
RBC: 4.56 Mil/uL (ref 4.22–5.81)
RDW: 13.6 % (ref 11.5–15.5)
WBC: 7.5 K/uL (ref 4.0–10.5)

## 2024-08-15 LAB — LIPID PANEL
Cholesterol: 114 mg/dL (ref 0–200)
HDL: 31.9 mg/dL — ABNORMAL LOW (ref >39.00)
LDL Cholesterol: 33 mg/dL (ref 0–99)
NonHDL: 82.35
Total CHOL/HDL Ratio: 4
Triglycerides: 249 mg/dL — ABNORMAL HIGH (ref 0.0–149.0)
VLDL: 49.8 mg/dL — ABNORMAL HIGH (ref 0.0–40.0)

## 2024-08-15 LAB — PSA, MEDICARE: PSA: 1.35 ng/mL (ref 0.10–4.00)

## 2024-08-15 LAB — VITAMIN D 25 HYDROXY (VIT D DEFICIENCY, FRACTURES): VITD: 31.74 ng/mL (ref 30.00–100.00)

## 2024-08-15 LAB — MICROALBUMIN / CREATININE URINE RATIO
Creatinine,U: 26.5 mg/dL
Microalb Creat Ratio: UNDETERMINED mg/g (ref 0.0–30.0)
Microalb, Ur: <0.7 mg/dL

## 2024-08-15 LAB — HEMOGLOBIN A1C: Hgb A1c MFr Bld: 7.4 % — ABNORMAL HIGH (ref 4.6–6.5)

## 2024-08-15 MED ORDER — PHENTERMINE HCL 37.5 MG PO TABS: 37.5000 mg | ORAL_TABLET | Freq: Every day | ORAL | 2 refills | Status: DC

## 2024-08-15 MED ORDER — PREGABALIN 25 MG PO CAPS: 25.0000 mg | ORAL_CAPSULE | Freq: Two times a day (BID) | ORAL | 0 refills | Status: AC

## 2024-08-15 MED ORDER — OMEPRAZOLE 20 MG PO CPDR: DELAYED_RELEASE_CAPSULE | ORAL | 3 refills | Status: AC

## 2024-08-15 NOTE — Assessment & Plan Note (Signed)
 Chronic Blood pressure slightly elevated here today, but typically well-controlled CMP, CBC Continue valsartan -hydrochlorothiazide  80-12.5 mg daily

## 2024-08-15 NOTE — Assessment & Plan Note (Signed)
 Chronic Bilateral feet Controlled Continue pregabalin 25 mg twice daily

## 2024-08-15 NOTE — Patient Instructions (Addendum)
      Blood work was ordered.       Medications changes include :   None     Return in about 3 months (around 11/15/2024) for Physical Exam.

## 2024-08-15 NOTE — Assessment & Plan Note (Signed)
 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

## 2024-08-15 NOTE — Assessment & Plan Note (Signed)
 Chronic Actively working on weight loss BMI 34.85 He is exercising regularly and will continue to do so Discussed the importance of decreasing calorie intake-aim for about 1600 cal a day-concentrate on eating proteins, vegetables and fiber.  Avoid too much fruits, carbs, sugars and drinking calories

## 2024-08-15 NOTE — Assessment & Plan Note (Signed)
 Chronic Currently on Flomax  0.4 mg daily Having issues emptying bladder, after he completes urination having to go couple more times with very minimal amounts Likely related to BPH-not ideally controlled Has upcoming appointment with urology Will check PSA today

## 2024-08-15 NOTE — Assessment & Plan Note (Signed)
 Chronic Taking vitamin D daily Check vitamin D level

## 2024-08-15 NOTE — Assessment & Plan Note (Signed)
 Chronic Using BiPAP nightly Getting more sleep then his watch is registering and does feel tired during the day advised to follow-up with whoever is managing his CPAP to see if that is working well or if revisions are needed

## 2024-08-15 NOTE — Assessment & Plan Note (Signed)
Chronic GERD controlled Continue omeprazole 20 mg daily  

## 2024-08-15 NOTE — Assessment & Plan Note (Signed)
 Chronic Controlled, Stable Continue Lexapro  10 mg daily Discussed that at some point if he is not sure if he really needs the medication okay to decrease to 5 mg daily and see how he does with it

## 2024-08-15 NOTE — Assessment & Plan Note (Signed)
 Chronic With CAD Regular exercise and healthy diet encouraged Check lipid panel  Continue Crestor 10 mg daily-has only been on this for 2 weeks

## 2024-08-15 NOTE — Assessment & Plan Note (Signed)
 Chronic ?  Controlled-he may be in bed for about 9 hours, but is only getting a little over 5 hours of sleep Uses CPAP nightly-encouraged him to discuss with was managing his sleep apnea to review the effectiveness of the machine Continue trazodone  50 mg nightly

## 2024-08-15 NOTE — Assessment & Plan Note (Signed)
 Avoids mammalian meats and is asymptomatic Following with allergy 

## 2024-08-15 NOTE — Assessment & Plan Note (Signed)
 Chronic Controlled Following with allergy -Dr. Luke Continue daily Breo, albuterol  as needed

## 2024-08-17 ENCOUNTER — Ambulatory Visit: Payer: Self-pay | Admitting: Internal Medicine

## 2024-08-25 ENCOUNTER — Ambulatory Visit (INDEPENDENT_AMBULATORY_CARE_PROVIDER_SITE_OTHER): Payer: Self-pay | Admitting: Plastic Surgery

## 2024-08-25 DIAGNOSIS — L719 Rosacea, unspecified: Secondary | ICD-10-CM

## 2024-08-25 NOTE — Progress Notes (Signed)
 Preoperative Dx: Rhinophyma  Postoperative Dx:  same  Procedure: laser to nose  Anesthesia: none  Description of Procedure:  Risks and complications were explained to the patient. Consent was confirmed and signed. Eye protection was placed. Time out was called and all information was confirmed to be correct. The area  area was prepped with alcohol and wiped dry. The TRL laser was set at 70-80 J/cm2. The nose was lasered. The patient tolerated the procedure well and there were no complications. The patient is to follow up in 4 weeks.

## 2024-08-27 ENCOUNTER — Encounter: Payer: Self-pay | Admitting: Internal Medicine

## 2024-08-31 NOTE — Progress Notes (Unsigned)
 Referring Physician:  Lendia Boby CROME, NP-C [address] [place],  [place] [address]  Primary Physician:  Geofm Glade PARAS, MD  History of Present Illness: 09/05/2024 [name] has a history of HTN, asthma, OSA with CPAP, fatty liver, GERD, hyperlipidemia, DM, CKD stage 2, cluster headaches, neuropathy, BPH, allergy  to alpha-gal, anxiety, depression, obesity, rosacea.   He has intermittent LBP with intermittent bilateral lateral thigh pain to his knees. LBP = leg pain, right leg pain > left leg pain. He also has intermittent numbness and tingling in both feet that does not seem to be related to his back pain. He's had a few falls due to numbness in feet in last 6 months. Pain is worse in the morning and at the end of the day. Symptoms better with walking and worse with prolonged sitting.   Prednisone  helps short term. Lyrica has helped a lot.   He is having foot surgery on 09/26/24.   Tobacco use: Does not smoke.   Bowel/Bladder Dysfunction: none  Conservative measures:  Physical therapy:  none Multimodal medical therapy including regular antiinflammatories:  Acetaminophen , Prednisone , Lyrica Injections:  No epidural steroid injections  Past Surgery: no spine surgery  George Montgomery has no symptoms of cervical myelopathy.  The symptoms are causing a significant impact on the patient's life.   Review of Systems:  A 10 point review of systems is negative, except for the pertinent positives and negatives detailed in the HPI.  Past Medical History: Past Medical History:  Diagnosis Date   Arthritis    Asthma    Back pain    Chronic pain of both shoulders    Depression    Dyspnea    Elevated glucose    Environmental allergies    GERD (gastroesophageal reflux disease)    HLD (hyperlipidemia)    HTN (hypertension)    Insomnia    Obesity    OSA on CPAP 08/01/2014   Reflux    Trigger finger     Past Surgical History: Past Surgical History:  Procedure  Laterality Date   APPENDECTOMY  07/2012   bone spur removal     HEMORRHOID SURGERY  09/2012   HERNIA REPAIR      Allergies: Allergies as of 09/05/2024 - Review Complete 09/05/2024  Allergen Reaction Noted   Bee venom Anaphylaxis 06/18/2016   Pine Anaphylaxis 05/07/2020   Peanut -containing drug products Itching and Other (See Comments) 10/31/2021   Alpha-gal  08/06/2024   Brazil nut (berthollefia excelsa)  02/24/2022   Gabapentin  Other (See Comments) 12/01/2023   Lisinopril  Other (See Comments) 03/04/2016   Penicillins Itching 08/11/2022   Singulair  [montelukast ]  08/07/2024   Celebrex  [celecoxib ]  11/22/2017    Medications: Outpatient Encounter Medications as of 09/05/2024  Medication Sig   albuterol  (VENTOLIN  HFA) 108 (90 Base) MCG/ACT inhaler Inhale 2 puffs into the lungs every 4 (four) hours as needed for wheezing or shortness of breath (coughing fits).   aspirin EC 81 MG tablet Take 1 tablet (81 mg total) by mouth daily. Swallow whole.   EPINEPHrine  (EPIPEN  2-PAK) 0.3 mg/0.3 mL IJ SOAJ injection Inject 0.3 mg into the muscle as needed for anaphylaxis.   escitalopram  (LEXAPRO ) 10 MG tablet Take  1 tablet  Daily for Mood                                                              /  TAKE                                         BY                                                 MOUTH   fenofibrate  (TRICOR ) 145 MG tablet Take 1 tablet (145 mg total) by mouth daily.   fluticasone  (FLONASE ) 50 MCG/ACT nasal spray Place 1 spray into both nostrils 2 (two) times daily as needed (nasal congestion).   Fluticasone -Umeclidin-Vilant (TRELEGY ELLIPTA ) 200-62.5-25 MCG/ACT AEPB Inhale 1 puff into the lungs daily. Rinse mouth after each use.   omeprazole  (PRILOSEC) 20 MG capsule Take  1 capsule  Daily to Prevent Heartburn & Indigestion                                   /                                                                    TAKE                                         BY                                                 MOUTH   phentermine  (ADIPEX-P ) 37.5 MG tablet Take 1 tablet (37.5 mg total) by mouth daily before breakfast.   pregabalin (LYRICA) 25 MG capsule Take 1 capsule (25 mg total) by mouth 2 (two) times daily.   rosuvastatin (CRESTOR) 10 MG tablet Take 1 tablet (10 mg total) by mouth daily.   sildenafil  (VIAGRA ) 100 MG tablet Take   1/2 to 1 tablet   Daily  as needed for XXXX   tamsulosin  (FLOMAX ) 0.4 MG CAPS capsule TAKE 1 CAPSULE BY MOUTH AT BEDTIME FOR PROSTATE   traZODone  (DESYREL ) 50 MG tablet TAKE 1 TABLET BY MOUTH EVERYDAY AT BEDTIME   valsartan -hydrochlorothiazide  (DIOVAN -HCT) 80-12.5 MG tablet Take 1 tablet by mouth daily. for blood pressure   No facility-administered encounter medications on file as of 09/05/2024.    Social History: Social History   Tobacco Use   Smoking status: Former    Current packs/day: 0.00    Average packs/day: 1 pack/day for 40.0 years (40.0 ttl pk-yrs)    Types: Cigarettes    Start date: 12/03/1972    Quit date: 12/03/2012    Years since quitting: 11.7   Smokeless tobacco: Never  Vaping Use   Vaping status: Never Used  Substance Use Topics   Alcohol use: Yes    Alcohol/week: 1.0 standard drink of alcohol    Types: 1 Glasses of wine per week    Comment: occ  Drug use: No    Family Medical History: Family History  Problem Relation Age of Onset   Heart disease Mother    Macular degeneration Mother    Hypertension Mother    Hyperlipidemia Mother    Depression Mother    Anxiety disorder Mother    Liver disease Father    Alcoholism Father    Snoring Father    Hypertension Brother    Allergic rhinitis Brother    Asthma Brother    Sinusitis Brother    Bronchitis Brother    Sleep apnea Brother    Stroke Maternal Aunt    Snoring Maternal Uncle    Cancer - Colon Paternal Uncle    Cancer - Prostate Paternal Uncle    Cancer - Colon Other    Cancer -  Prostate Other    Immunodeficiency Neg Hx    Urticaria Neg Hx    Eczema Neg Hx    Angioedema Neg Hx     Physical Examination: Vitals:   09/05/24 1301  BP: 136/66    General: Patient is well developed, well nourished, calm, collected, and in no apparent distress. Attention to examination is appropriate.  Respiratory: Patient is breathing without any difficulty.   NEUROLOGICAL:     Awake, alert, oriented to person, place, and time.  Speech is clear and fluent. Fund of knowledge is appropriate.   Cranial Nerves: Pupils equal round and reactive to light.  Facial tone is symmetric.    Mild central lower posterior lumbar tenderness.   No abnormal lesions on exposed skin.   Strength: Side Biceps Triceps Deltoid Interossei Grip Wrist Ext. Wrist Flex.  R 5 5 5 5 5 5 5   L 5 5 5 5 5 5 5    Side Iliopsoas Quads Hamstring PF DF EHL  R 5 5 5 5 5 5   L 5 5 5 5 5 5    Reflexes are 1+ and symmetric at the biceps, brachioradialis, patella and achilles.   Hoffman's is absent.  Clonus is not present.   Bilateral upper and lower extremity sensation is intact to light touch.     No pain with IR/ER of right hip, mild groin pain with IR/ER of left hip.   Gait is normal.     Medical Decision Making  Imaging: Lumbar xrays dated 03/18/23:  FINDINGS: No fracture, dislocation or subluxation. No spondylolisthesis. No osteolytic or osteoblastic changes.   Degenerative disc disease noted with disc space narrowing and marginal osteophytes at each lumbar level especially L1-2 and L5-S1.   IMPRESSION: Degenerative changes. No acute osseous abnormalities.     Electronically Signed   By: Fonda Field M.D.   On: 03/20/2023 16:03  I have personally reviewed the images and agree with the above interpretation.  Assessment and Plan: Mr. Passero has intermittent LBP with intermittent bilateral lateral thigh pain to his knees. LBP = leg pain, right leg pain > left leg pain.   He also has  intermittent numbness and tingling in both feet that does not seem to be related to his back pain.   He has known lumbar spondylosis and DDD.   Treatment options discussed with patient and following plan made:   - MRI of lumbar spine to further evaluate LBP and bilateral leg pain. Will do WIDE BORE MRI.  - EMG/NCS of bilateral lower extremities to evaluate numbness/tingling in feet. Orders to [facility]. Of note, he has been to [facility] neurology in the past and does not want to see them again.  -  Of note, he is having foot surgery on 09/26/24. Will review with Dr. Leigh regarding timing of EMG and message him.  - Will schedule phone visit to review MRI results once I get them back.   I spent a total of 35 minutes in face-to-face and non-face-to-face activities related to this patient's care today including review of outside records, review of imaging, review of symptoms, physical exam, discussion of differential diagnosis, discussion of treatment options, and documentation.   Thank you for involving me in the care of this patient.   Glade Boys PA-C Dept. of Neurosurgery

## 2024-09-01 NOTE — Progress Notes (Signed)
 Cardiology Office Note:    Date:  09/07/2024   ID:  George Montgomery, DOB 1956-02-21, MRN [id number]  PCP:  Geofm Glade PARAS, MD   [facility] Providers Cardiologist:  None     Referring MD: Lendia Boby LITTIE, NP-C   No chief complaint on file.   History of Present Illness:    George Montgomery is a 68 y.o. male is seen for follow up CAD.  He has a history of HTN, HLD, OSA on CPAP, ED and obesity. Prediabetic. ETT in 2018 was normal except for hypertensive BP response.   He was seen recently with complaints of chest pain that awoke him from sleep.  Central chest without radiation. Got up to drink water and had belching. Pain resolved but then recurred x 1 - again relieved with belching. States pain was bad enough that if it didn't resolve he was going to call 911.   He underwent coronary CTA showing calcium score of 606. Nonobstructive CAD.  He does note he stopped GLP 1 agonist this past year because he had acute GI illness x 2. Was subsequently diagnosed with Alpha gal.    Past Medical History:  Diagnosis Date   Arthritis    Asthma    Back pain    Chronic pain of both shoulders    Depression    Dyspnea    Elevated glucose    Environmental allergies    GERD (gastroesophageal reflux disease)    HLD (hyperlipidemia)    HTN (hypertension)    Insomnia    Obesity    OSA on CPAP 08/01/2014   Reflux    Trigger finger     Past Surgical History:  Procedure Laterality Date   APPENDECTOMY  07/2012   bone spur removal     HEMORRHOID SURGERY  09/2012   HERNIA REPAIR      Current Medications: Current Meds  Medication Sig   albuterol  (VENTOLIN  HFA) 108 (90 Base) MCG/ACT inhaler Inhale 2 puffs into the lungs every 4 (four) hours as needed for wheezing or shortness of breath (coughing fits).   aspirin EC 81 MG tablet Take 1 tablet (81 mg total) by mouth daily. Swallow whole.   EPINEPHrine  (EPIPEN  2-PAK) 0.3 mg/0.3 mL IJ SOAJ injection Inject 0.3 mg into the muscle as  needed for anaphylaxis.   escitalopram  (LEXAPRO ) 10 MG tablet Take  1 tablet  Daily for Mood                                                              /                                                                   TAKE                                         BY  MOUTH   fenofibrate  (TRICOR ) 145 MG tablet Take 1 tablet (145 mg total) by mouth daily.   fluticasone  (FLONASE ) 50 MCG/ACT nasal spray Place 1 spray into both nostrils 2 (two) times daily as needed (nasal congestion).   Fluticasone -Umeclidin-Vilant (TRELEGY ELLIPTA ) 200-62.5-25 MCG/ACT AEPB Inhale 1 puff into the lungs daily. Rinse mouth after each use.   omeprazole  (PRILOSEC) 20 MG capsule Take  1 capsule  Daily to Prevent Heartburn & Indigestion                                   /                                                                   TAKE                                         BY                                                 MOUTH   phentermine  (ADIPEX-P ) 37.5 MG tablet Take 1 tablet (37.5 mg total) by mouth daily before breakfast.   pregabalin (LYRICA) 25 MG capsule Take 1 capsule (25 mg total) by mouth 2 (two) times daily.   rosuvastatin (CRESTOR) 10 MG tablet Take 1 tablet (10 mg total) by mouth daily.   sildenafil  (VIAGRA ) 100 MG tablet Take   1/2 to 1 tablet   Daily  as needed for XXXX   tamsulosin  (FLOMAX ) 0.4 MG CAPS capsule TAKE 1 CAPSULE BY MOUTH AT BEDTIME FOR PROSTATE   traZODone  (DESYREL ) 50 MG tablet TAKE 1 TABLET BY MOUTH EVERYDAY AT BEDTIME   valsartan -hydrochlorothiazide  (DIOVAN -HCT) 80-12.5 MG tablet Take 1 tablet by mouth daily. for blood pressure     Allergies:   Bee venom, Pine, Peanut -containing drug products, Alpha-gal, Brazil nut (berthollefia excelsa), Gabapentin , Lisinopril , Penicillins, Singulair  [montelukast ], and Celebrex  [celecoxib ]   Social History   Socioeconomic History   Marital status: Married    Spouse name: Reena   Number of  children: 4   Years of education: college   Highest education level: Not on file  Occupational History   Occupation: Advertising Account Planner    Comment: [employer]  Tobacco Use   Smoking status: Former    Current packs/day: 0.00    Average packs/day: 1 pack/day for 40.0 years (40.0 ttl pk-yrs)    Types: Cigarettes    Start date: 12/03/1972    Quit date: 12/03/2012    Years since quitting: 11.7   Smokeless tobacco: Never  Vaping Use   Vaping status: Never Used  Substance and Sexual Activity   Alcohol use: Yes    Alcohol/week: 1.0 standard drink of alcohol    Types: 1 Glasses of wine per week    Comment: occ   Drug use: No   Sexual activity: Yes    Birth control/protection: None  Other Topics Concern   Not on file  Social History  Narrative   Right handed, caffeine 3-4 cups daily, Married, 4 kids, 9 g kids., FT insurance agent (self employed). 4 yrs college   Pt lives with wife    Social Drivers of Corporate Investment Banker Strain: Low Risk  (06/06/2024)   Overall Financial Resource Strain (CARDIA)    Difficulty of Paying Living Expenses: Not hard at all  Food Insecurity: No Food Insecurity (06/06/2024)   Hunger Vital Sign    Worried About Running Out of Food in the Last Year: Never true    Ran Out of Food in the Last Year: Never true  Transportation Needs: No Transportation Needs (06/06/2024)   PRAPARE - Administrator, Civil Service (Medical): No    Lack of Transportation (Non-Medical): No  Physical Activity: Inactive (06/06/2024)   Exercise Vital Sign    Days of Exercise per Week: 0 days    Minutes of Exercise per Session: 0 min  Stress: No Stress Concern Present (06/06/2024)   [employer] of Occupational Health - Occupational Stress Questionnaire    Feeling of Stress: Not at all  Social Connections: Moderately Integrated (06/06/2024)   Social Connection and Isolation Panel    Frequency of Communication with Friends and Family: More than three  times a week    Frequency of Social Gatherings with Friends and Family: More than three times a week    Attends Religious Services: Never    Database Administrator or Organizations: Yes    Attends Engineer, Structural: More than 4 times per year    Marital Status: Married     Family History: The patient's family history includes Alcoholism in his father; Allergic rhinitis in his brother; Anxiety disorder in his mother; Asthma in his brother; Bronchitis in his brother; Cancer - Colon in his paternal uncle and another family member; Cancer - Prostate in his paternal uncle and another family member; Depression in his mother; Heart disease in his mother; Hyperlipidemia in his mother; Hypertension in his brother and mother; Liver disease in his father; Macular degeneration in his mother; Sinusitis in his brother; Sleep apnea in his brother; Snoring in his father and maternal uncle; Stroke in his maternal aunt. There is no history of Immunodeficiency, Urticaria, Eczema, or Angioedema.  ROS:   Please see the history of present illness.     All other systems reviewed and are negative.  EKGs/Labs/Other Studies Reviewed:    The following studies were reviewed today:     Coronary CTA 08/08/24: Cardiac/Coronary CTA   TECHNIQUE: A non-contrast, gated CT scan was obtained with axial slices of 2.5 mm through the heart for calcium scoring. Calcium scoring was performed using the Agatston method. A 120 kV prospective, gated, contrast cardiac CT scan was obtained. Gantry rotation speed was 230 msec and collimation was 0.63 mm. Two sublingual nitroglycerin tablets (0.8 mg) were given. The 3D data set was reconstructed with motion correction for the best systolic or diastolic phase. Images were analyzed on a dedicated workstation using MPR, MIP, and VRT modes. The patient received 100mL OMNIPAQUE  IOHEXOL  350 MG/ML SOLN of contrast.   FINDINGS: Image quality: Excellent.   Noise artifact is:  Limited.   Coronary arteries: Normal coronary origins.  Right dominance.   Right Coronary Artery: Dominant. Mild mixed proximal and mid-vessel 25-49% stenosis. Normal R-PLB and R-PDA branches.   Left Main Coronary Artery: Normal. Bifurcates into the LAD and LCx arteries.   Left Anterior Descending Coronary Artery: Large anterior artery that wraps around  the apex. Ostal and proximal mild mixed 25-49% stenosis. Moderate mixed 50-69% mid-vessel stenosis. Large D1 branch without disease.   Left Circumflex Artery: AV groove vessel with minimal 1-24% mixed ostial and proximal stenosis. High OM1 branch with minimal mixed ostial 1-24% stenosis. Large distal OM2 branch that bifurcates, minimal mixed ostial 1-24% stenosis.   Aorta: Normal size, 30 mm at the mid ascending aorta (level of the PA bifurcation) measured double oblique. Aortic atherosclerosis. No dissection.   Aortic Valve: Trileaflet. No calcifications.   Other findings:   Normal pulmonary vein drainage into the left atrium.   Normal left atrial appendage without a thrombus.   Normal size of the pulmonary artery.   Extra-cardiac findings: See attached radiology report for non-cardiac structures.   IMPRESSION: 1. Mild to moderate mixed CAD, CADRADS = 3. CT FFR will be performed and reported separately.   2. Normal coronary origin with right dominance.   3. Coronary artery calcium score is 606, which places the patient in the 79th percentile for age/race and sex-matched controls (MESA).   4. Aortic atherosclerosis.   5. Aggressive secondary risk factor modification is recommended.   1. Left Main:  No significant stenosis. FFR = 1.00   2. LAD: No significant stenosis. Proximal FFR = 0.94, Mid FFR = 0.81, Distal FFR = 0.76 (tapering) 3. LCX: No significant stenosis. Proximal FFR = 0.93, Distal FFR = 0.84, Distal OM1 FFR = 0.77 4. RCA: No significant stenosis. Proximal FFR = 0.96, Mid FFR = 0.88, Distal FFR =  0.85   IMPRESSION: 1. CT FFR analysis did not show any significant discrete stenosis (<0.75).      Recent Labs: 04/06/2024: TSH 1.12 08/15/2024: ALT 26; BUN 17; Creatinine, Ser 0.81; Hemoglobin 14.9; Platelets 204.0; Potassium 4.1; Sodium 139  Recent Lipid Panel    Component Value Date/Time   CHOL 114 08/15/2024 1152   TRIG 249.0 (H) 08/15/2024 1152   HDL 31.90 (L) 08/15/2024 1152   CHOLHDL 4 08/15/2024 1152   VLDL 49.8 (H) 08/15/2024 1152   LDLCALC 33 08/15/2024 1152   LDLCALC 101 (H) 08/20/2023 1141     Risk Assessment/Calculations:                Physical Exam:    VS:  BP 130/68   Pulse 76   Ht 5' 9 (1.753 m)   Wt 235 lb 8 oz (106.8 kg)   SpO2 96%   BMI 34.78 kg/m     Wt Readings from Last 3 Encounters:  09/07/24 235 lb 8 oz (106.8 kg)  09/05/24 233 lb (105.7 kg)  08/15/24 236 lb (107 kg)     GEN:  Well nourished, well developed in no acute distress HEENT: Normal NECK: No JVD; No carotid bruits LYMPHATICS: No lymphadenopathy CARDIAC: RRR, no murmurs, rubs, gallops RESPIRATORY:  Clear to auscultation without rales, wheezing or rhonchi  ABDOMEN: Soft, non-tender, non-distended MUSCULOSKELETAL:  No edema; No deformity  SKIN: Warm and dry NEUROLOGIC:  Alert and oriented x 3 PSYCHIATRIC:  Normal affect   ASSESSMENT:    1. Coronary artery calcification   2. Primary hypertension   3. Mixed hyperlipidemia     PLAN:    In order of problems listed above:  Chest pain. Atypical likely GI related.  Ecg was normal. CTA showed coronary calcium score of 606 with nonobstructive CAD. Now  on statin Crestor 10 mg daily. Goal LDL < 55. LDL 33.  Will start ASA 81 mg daily. Focus on lifestyle modification with regular aerobic  exercise, heart healthy diet and weight loss.  Mixed HLD. On fenofibrate  for elevated triglycerides. Now on Crestor. Recommend fish oil supplement 4 grams/day. Would prefer Vascepa but not currently on formulary. HTN well controlled Obesity  with OSA on CPAP Prediabetes.     Follow up with me in one year        Medication Adjustments/Labs and Tests Ordered: Current medicines are reviewed at length with the patient today.  Concerns regarding medicines are outlined above.  No orders of the defined types were placed in this encounter.  No orders of the defined types were placed in this encounter.   Patient Instructions  Medication Instructions:  Fish Oil 4 Grams daily       ( Vascepa ) Continue all other medications *If you need a refill on your cardiac medications before your next appointment, please call your pharmacy*  Lab Work: None ordered  Testing/Procedures: None ordered  Follow-Up: At [facility], you and your health needs are our priority.  As part of our continuing mission to provide you with exceptional heart care, our providers are all part of one team.  This team includes your primary Cardiologist (physician) and Advanced Practice Providers or APPs (Physician Assistants and Nurse Practitioners) who all work together to provide you with the care you need, when you need it.  Your next appointment:  1 year     Call in August to schedule Nov appointment     Provider:  Dr.Darothy Courtright    We recommend signing up for the patient portal called MyChart.  Sign up information is provided on this After Visit Summary.  MyChart is used to connect with patients for Virtual Visits (Telemedicine).  Patients are able to view lab/test results, encounter notes, upcoming appointments, etc.  Non-urgent messages can be sent to your provider as well.   To learn more about what you can do with MyChart, go to [email or web address].     Signed, Vessie Olmsted, MD  09/07/2024 12:09 PM    [facility]

## 2024-09-04 ENCOUNTER — Ambulatory Visit

## 2024-09-04 DIAGNOSIS — Z91038 Other insect allergy status: Secondary | ICD-10-CM | POA: Diagnosis not present

## 2024-09-05 ENCOUNTER — Encounter: Payer: Self-pay | Admitting: Orthopedic Surgery

## 2024-09-05 ENCOUNTER — Ambulatory Visit: Admitting: Orthopedic Surgery

## 2024-09-05 VITALS — BP 136/66 | Ht 69.0 in | Wt 233.0 lb

## 2024-09-05 DIAGNOSIS — M5416 Radiculopathy, lumbar region: Secondary | ICD-10-CM

## 2024-09-05 DIAGNOSIS — M4726 Other spondylosis with radiculopathy, lumbar region: Secondary | ICD-10-CM | POA: Diagnosis not present

## 2024-09-05 DIAGNOSIS — M51362 Other intervertebral disc degeneration, lumbar region with discogenic back pain and lower extremity pain: Secondary | ICD-10-CM | POA: Diagnosis not present

## 2024-09-05 DIAGNOSIS — M47816 Spondylosis without myelopathy or radiculopathy, lumbar region: Secondary | ICD-10-CM

## 2024-09-05 DIAGNOSIS — R200 Anesthesia of skin: Secondary | ICD-10-CM

## 2024-09-05 NOTE — Patient Instructions (Signed)
 It was so nice to see you today. Thank you so much for coming in.    Your previous lumbar xrays showed some wear and tear with degeneration of the disc.   I want to get an MRI of your lower back to look into things further. We will get this approved through your insurance and [facility] will call you to schedule the appointment. Ask about your patient responsibility. You do not need to pay this prior to getting MRI, they can bill you.   Make sure you are scheduled for the WIDE BORE MRI.   After you have the MRI, it can take 14-28 days for me to get the results back. If I don't have them in 2 weeks, we will call to try to get the results.   Once I have the results, we will call you to schedule a follow up phone visit with me to review them.   I want to get an EMG (nerve conduction test) to look into things further. I have ordered this and [facility] will call you to schedule. You can also call them at [phone].   Please do not hesitate to call if you have any questions or concerns. You can also message me in MyChart.   Glade Boys PA-C [phone]     The physicians and staff at [facility] at [facility] are committed to providing excellent care. You may receive a survey asking for feedback about your experience at our office. We value you your feedback and appreciate you taking the time to to fill it out. The [facility] leadership team is also available to discuss your experience in person, feel free to contact us  [phone].

## 2024-09-07 ENCOUNTER — Ambulatory Visit: Attending: Cardiology | Admitting: Cardiology

## 2024-09-07 ENCOUNTER — Encounter: Payer: Self-pay | Admitting: Cardiology

## 2024-09-07 VITALS — BP 130/68 | HR 76 | Ht 69.0 in | Wt 235.5 lb

## 2024-09-07 DIAGNOSIS — E782 Mixed hyperlipidemia: Secondary | ICD-10-CM | POA: Diagnosis not present

## 2024-09-07 DIAGNOSIS — I10 Essential (primary) hypertension: Secondary | ICD-10-CM

## 2024-09-07 DIAGNOSIS — I2510 Atherosclerotic heart disease of native coronary artery without angina pectoris: Secondary | ICD-10-CM | POA: Diagnosis not present

## 2024-09-07 NOTE — Patient Instructions (Signed)
 Medication Instructions:  Fish Oil 4 Grams daily       ( Vascepa ) Continue all other medications *If you need a refill on your cardiac medications before your next appointment, please call your pharmacy*  Lab Work: None ordered  Testing/Procedures: None ordered  Follow-Up: At [facility], you and your health needs are our priority.  As part of our continuing mission to provide you with exceptional heart care, our providers are all part of one team.  This team includes your primary Cardiologist (physician) and Advanced Practice Providers or APPs (Physician Assistants and Nurse Practitioners) who all work together to provide you with the care you need, when you need it.  Your next appointment:  1 year     Call in August to schedule Nov appointment     Provider:  Dr.Jordan    We recommend signing up for the patient portal called MyChart.  Sign up information is provided on this After Visit Summary.  MyChart is used to connect with patients for Virtual Visits (Telemedicine).  Patients are able to view lab/test results, encounter notes, upcoming appointments, etc.  Non-urgent messages can be sent to your provider as well.   To learn more about what you can do with MyChart, go to [email or web address].

## 2024-09-08 ENCOUNTER — Other Ambulatory Visit: Payer: Self-pay

## 2024-09-08 DIAGNOSIS — R202 Paresthesia of skin: Secondary | ICD-10-CM

## 2024-09-12 ENCOUNTER — Ambulatory Visit (INDEPENDENT_AMBULATORY_CARE_PROVIDER_SITE_OTHER): Admitting: Neurology

## 2024-09-12 DIAGNOSIS — M5417 Radiculopathy, lumbosacral region: Secondary | ICD-10-CM

## 2024-09-12 DIAGNOSIS — R202 Paresthesia of skin: Secondary | ICD-10-CM

## 2024-09-12 NOTE — Procedures (Signed)
 [facility] Neurology  [address] [place], [address]  [place], [place] [address] Tel: [phone] Fax: [phone] Test Date:  09/12/2024  Patient: George Montgomery DOB: 09-15-1956 Physician: Venetia Potters, MD  Sex: Male Height: 5' 9 Ref Phys: Glade Boys PA-C  ID#: [id number]   Technician:    History: This is a 68 year old male with low back pain, thigh pain, and numbness and tingling in his feet.  NCV & EMG Findings: Extensive electrodiagnostic evaluation of bilateral lower limbs shows: Bilateral superficial peroneal/fibular sensory responses are absent. Bilateral sural sensory responses are within normal limits. Bilateral peroneal/fibular (EDB) and tibial (AH) motor responses are within normal limits. Bilateral H reflexes are absent. Chronic motor axon loss changes without accompanying active denervation changes are seen in bilateral tibialis anterior, bilateral flexor digitorum longus, bilateral gluteus medius, and bilateral lumbosacral paraspinal (L5 level) muscles.  Impression: This is an abnormal study. 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 The findings are mild in degree electrically, left mildly worse than right. No electrodiagnostic evidence of a large fiber sensorimotor neuropathy. Absent bilateral superficial peroneal sensory responses and H reflexes are of unclear clinical significance and may be normal for age or technical in nature.    ___________________________ Venetia Potters, MD    Nerve Conduction Studies Motor Nerve Results    Latency Amplitude F-Lat Segment Distance CV Comment  Site (ms) Norm (mV) Norm (ms)  (cm) (m/s) Norm   Left Fibular (EDB) Motor  Ankle 4.0  < 6.0 5.0  > 2.5        Bel fib head 11.0 - 4.2 -  Bel fib head-Ankle 31 44  > 40   Pop fossa 12.7 - 4.0 -  Pop fossa-Bel fib head 8 47 -   Right Fibular (EDB) Motor  Ankle  3.7  < 6.0 9.0  > 2.5        Bel fib head 10.8 - 8.0 -  Bel fib head-Ankle 30 42  > 40   Pop fossa 12.8 - 7.9 -  Pop fossa-Bel fib head 8.5 43 -   Left Tibial (AH) Motor  Ankle 3.1  < 6.0 12.1  > 4.0        Knee 12.0 - 9.9 -  Knee-Ankle 38 43  > 40   Right Tibial (AH) Motor  Ankle 3.2  < 6.0 10.6  > 4.0        Knee 11.7 - 8.0 -  Knee-Ankle 42 49  > 40    Sensory Sites    Neg Peak Lat Amplitude (O-P) Segment Distance Velocity Comment  Site (ms) Norm (V) Norm  (cm) (ms)   Left Superficial Fibular Sensory  14 cm-Ankle *NR  < 4.6 *NR  > 3 14 cm-Ankle 14    Right Superficial Fibular Sensory  14 cm-Ankle *NR  < 4.6 *NR  > 3 14 cm-Ankle 14    Left Sural Sensory  Calf-Lat mall 3.3  < 4.6 5  > 3 Calf-Lat mall 14    Right Sural Sensory  Calf-Lat mall 3.6  < 4.6 5  > 3 Calf-Lat mall 14     H-Reflex Results    M-Lat H Lat H Neg Amp H-M Lat  Site (ms) (ms) Norm (mV) (ms)  Left Tibial H-Reflex  Pop fossa 6.7 -  < 35.0 - -  Right Tibial H-Reflex  Pop fossa 5.9 -  <  35.0 - -   Electromyography   Side Muscle Ins.Act Fibs Fasc Recrt Amp Dur Poly Activation Comment  Left Lumbar PSP lower Nml Nml Nml *1- *1+ *1+ Nml Nml N/A  Left Gluteus med Nml Nml Nml *1- *1+ *1+ Nml Nml N/A  Left Tib ant Nml Nml Nml *2- *1+ *1+ *1+ Nml N/A  Left FDL Nml Nml Nml *2- *1+ *1+ *1+ Nml N/A  Left Rectus fem Nml Nml Nml Nml Nml Nml Nml Nml N/A  Left Gastroc MH Nml Nml Nml Nml Nml Nml Nml Nml N/A  Left Biceps fem SH Nml Nml Nml Nml Nml Nml Nml Nml N/A  Right FDL Nml Nml Nml *1- *1+ *1+ *1+ Nml N/A  Right Tib ant Nml Nml Nml *1- *1+ *1+ *1+ Nml N/A  Right Lumbar PSP lower Nml Nml Nml *1- *1+ *1+ *1+ Nml N/A  Right Gluteus med Nml Nml Nml *1- *1+ *1+ *1+ Nml N/A  Right Gastroc MH Nml Nml Nml Nml Nml Nml Nml Nml N/A  Right Biceps fem SH Nml Nml Nml Nml Nml Nml Nml Nml N/A  Right Rectus fem Nml Nml Nml Nml Nml Nml Nml Nml N/A  Right Add longus Nml Nml Nml Nml Nml Nml Nml Nml N/A      Waveforms:  Motor            Sensory           H-Reflex

## 2024-09-13 ENCOUNTER — Encounter: Payer: Self-pay | Admitting: *Deleted

## 2024-09-13 NOTE — Progress Notes (Signed)
 George Montgomery                                          MRN: [id number]   09/13/2024   The VBCI Quality Team Specialist reviewed this patient medical record for the purposes of chart review for care gap closure. The following were reviewed: abstraction for care gap closure-glycemic status assessment and kidney health evaluation for diabetes:eGFR  and uACR.    VBCI Quality Team

## 2024-09-24 ENCOUNTER — Ambulatory Visit
Admission: RE | Admit: 2024-09-24 | Discharge: 2024-09-24 | Disposition: A | Source: Ambulatory Visit | Attending: Orthopedic Surgery | Admitting: Orthopedic Surgery

## 2024-09-24 DIAGNOSIS — M51362 Other intervertebral disc degeneration, lumbar region with discogenic back pain and lower extremity pain: Secondary | ICD-10-CM

## 2024-09-24 DIAGNOSIS — M47816 Spondylosis without myelopathy or radiculopathy, lumbar region: Secondary | ICD-10-CM

## 2024-09-24 DIAGNOSIS — M5416 Radiculopathy, lumbar region: Secondary | ICD-10-CM

## 2024-09-24 DIAGNOSIS — R200 Anesthesia of skin: Secondary | ICD-10-CM

## 2024-09-28 NOTE — Telephone Encounter (Signed)
 Patient scheduled for 10/02/2024.

## 2024-09-28 NOTE — Telephone Encounter (Signed)
 Please schedule phone visit with me to review his EMG and his lumbar MRI.

## 2024-09-29 NOTE — Progress Notes (Signed)
 Telephone Visit- Progress Note: Referring Physician:  Geofm Glade PARAS, MD [address] [place],  [place] [address]  Primary Physician:  Geofm Glade PARAS, MD  This visit was performed via telephone.  Patient location: home Provider location: office  I spent a total of 15 minutes non-face-to-face activities for this visit on the date of this encounter including review of current clinical condition and response to treatment.    Patient has given verbal consent to this telephone visits and we reviewed the limitations of a telephone visit. Patient wishes to proceed.    Chief Complaint:  review results  History of Present Illness: George Montgomery is a 68 y.o. male has a history of  HTN, asthma, OSA with CPAP, fatty liver, GERD, hyperlipidemia, DM, CKD stage 2, cluster headaches, neuropathy, BPH, allergy  to alpha-gal, anxiety, depression, obesity, rosacea.   Last seen by me on 09/05/24 for intermittent LBP with intermittent bilateral thigh pain. He also has intermittent numbness and tingling in both feet that does not seem to be related to his back pain.    He has known lumbar spondylosis and DDD.   Phone visit scheduled to review his lumbar MRI and EMG of his lower extremities.   He had right foot surgery on 09/26/24. He was up all night due to pain.   He continues with intermittent LBP with intermittent bilateral lateral thigh pain to his knees. LBP = leg pain, right leg pain > left leg pain. He thinks his numbness and tingling is better, but hard to tell due to foot surgery. Pain is worse in the morning and at the end of the day. Symptoms better with walking and worse with prolonged sitting.   Lyrica  has helped numbness in feet a lot.    Tobacco use: Does not smoke.    Conservative measures:  Physical therapy:  none Multimodal medical therapy including regular antiinflammatories:  Acetaminophen , Prednisone , Lyrica  Injections:  No epidural steroid injections   Past Surgery: no  spine surgery    The symptoms are causing a significant impact on the patient's life.    Exam: No exam done as this was a telephone encounter.     Imaging: Lumbar MRI dated 09/24/24:  FINDINGS: Segmentation:  Standard.   Alignment:  Physiologic.   Vertebrae: No acute fracture, evidence of discitis, or aggressive bone lesion.   Conus medullaris and cauda equina: Conus extends to the L2 level. Conus and cauda equina appear normal.   Paraspinal and other soft tissues: No acute paraspinal abnormality.   Disc levels:   Disc spaces: Mild disc height loss at L5-S1 with Modic 2 endplate changes. Mild Modic 1 endplate changes at L3-4 and L4-5.   T12-L1: No significant disc bulge. Mild bilateral facet arthropathy. No foraminal or central canal stenosis.   L1-L2: Mild broad-based disc bulge. Mild bilateral facet arthropathy. No foraminal or central canal stenosis.   L2-L3: Mild broad-based disc bulge. Mild bilateral facet arthropathy. No foraminal or central canal stenosis.   L3-L4: Mild disc bulge. Mild bilateral facet arthropathy. Mild central canal stenosis.   L4-L5: Mild disc bulge with a small left subarticular disc protrusion with possible impingement of the left L5 nerve root. Mild bilateral facet arthropathy. Mild central canal stenosis. Moderate-severe bilateral foraminal stenosis.   L5-S1: Mild disc bulge. Moderate bilateral foraminal stenosis. No central canal stenosis. Mild bilateral facet arthropathy.   IMPRESSION: 1. At L4-5 there is a mild disc bulge with a small left subarticular disc protrusion with possible impingement of  the left L5 nerve root. Mild bilateral facet arthropathy. Mild central canal stenosis. Moderate-severe bilateral foraminal stenosis. 2. At L5-S1 there is a mild disc bulge. Moderate bilateral foraminal stenosis. No central canal stenosis. Mild bilateral facet arthropathy. 3. No acute osseous injury of the lumbar spine.      Electronically Signed   By: Julaine Blanch M.D.   On: 09/27/2024 17:58  I have personally reviewed the images and agree with the above interpretation.   EMG of bilateral lower extremities dated 09/12/24:  Impression: This is an abnormal study. 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 The findings are mild in degree electrically, left mildly worse than right. No electrodiagnostic evidence of a large fiber sensorimotor neuropathy. Absent bilateral superficial peroneal sensory responses and H reflexes are of unclear clinical significance and may be normal for age or technical in nature.       ___________________________ Venetia Potters, MD  Assessment and Plan: Mr. Dieudonne continues with intermittent LBP with intermittent bilateral lateral thigh pain to his knees. LBP = leg pain, right leg pain > left leg pain.    He also has intermittent numbness and tingling in both feet.    He has known lumbar spondylosis and DDD. He has mild central stenosis L3-L5, left sided disc L4-L5 with possible impingement L5 and bilateral moderate/severe foraminal stenosis. Also with moderate bilateral foraminal stenosis L5-S1.   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 The findings are mild in degree electrically, left mildly worse than right.  LBP likely due to above spondylosis and DDD. Leg pain may be due to L5, but would expect it to go past knee. Not sure L5 radiculopathy would explain numbness/tingling in feet.    Treatment options discussed with patient and following plan made:   - Referral to PMR at [facility] to discuss possible lumbar injections.  - Will hold on lumbar PT at this time due to recent foot surgery.  - Will review EMG findings with Dr. Claudene and message him with further recommendations.  - Follow  up with me in 6-8 weeks for recheck.   Glade Boys PA-C Neurosurgery

## 2024-10-02 ENCOUNTER — Ambulatory Visit: Admitting: Orthopedic Surgery

## 2024-10-02 ENCOUNTER — Encounter: Payer: Self-pay | Admitting: Orthopedic Surgery

## 2024-10-02 DIAGNOSIS — M5416 Radiculopathy, lumbar region: Secondary | ICD-10-CM

## 2024-10-02 DIAGNOSIS — M47816 Spondylosis without myelopathy or radiculopathy, lumbar region: Secondary | ICD-10-CM

## 2024-10-02 DIAGNOSIS — M51362 Other intervertebral disc degeneration, lumbar region with discogenic back pain and lower extremity pain: Secondary | ICD-10-CM

## 2024-10-02 DIAGNOSIS — M4726 Other spondylosis with radiculopathy, lumbar region: Secondary | ICD-10-CM | POA: Diagnosis not present

## 2024-10-02 DIAGNOSIS — M48061 Spinal stenosis, lumbar region without neurogenic claudication: Secondary | ICD-10-CM | POA: Diagnosis not present

## 2024-10-02 DIAGNOSIS — R200 Anesthesia of skin: Secondary | ICD-10-CM

## 2024-10-09 ENCOUNTER — Ambulatory Visit

## 2024-10-09 DIAGNOSIS — Z91038 Other insect allergy status: Secondary | ICD-10-CM | POA: Diagnosis not present

## 2024-10-09 DIAGNOSIS — J3089 Other allergic rhinitis: Secondary | ICD-10-CM

## 2024-10-25 LAB — OPHTHALMOLOGY REPORT-SCANNED

## 2024-10-31 ENCOUNTER — Encounter: Payer: Self-pay | Admitting: Internal Medicine

## 2024-10-31 ENCOUNTER — Ambulatory Visit: Payer: Self-pay | Admitting: Plastic Surgery

## 2024-10-31 DIAGNOSIS — L719 Rosacea, unspecified: Secondary | ICD-10-CM

## 2024-10-31 NOTE — Progress Notes (Signed)
 Preoperative Dx: Rosacea of nose  Postoperative Dx:  same  Procedure: laser to nose  Anesthesia: none  Description of Procedure:  Risks and complications were explained to the patient. Consent was confirmed and signed. Eye protection was placed. Time out was called and all information was confirmed to be correct. The area  area was prepped with alcohol and wiped dry. The aerobic laser was set at 560 nm at preset J/cm2 for the nose vessels nose. The nose was lasered. The patient tolerated the procedure well and there were no complications. The patient is to follow up in 4 weeks.

## 2024-11-01 MED ORDER — TRAZODONE HCL 50 MG PO TABS: ORAL_TABLET | ORAL | 1 refills | Status: AC

## 2024-11-01 MED ORDER — PHENTERMINE HCL 37.5 MG PO TABS: 37.5000 mg | ORAL_TABLET | Freq: Every day | ORAL | 2 refills | Status: AC

## 2024-11-07 ENCOUNTER — Ambulatory Visit

## 2024-11-07 ENCOUNTER — Encounter: Payer: Self-pay | Admitting: Cardiology

## 2024-11-08 ENCOUNTER — Ambulatory Visit

## 2024-11-08 DIAGNOSIS — T7800XD Anaphylactic reaction due to unspecified food, subsequent encounter: Secondary | ICD-10-CM

## 2024-11-08 MED ORDER — ROSUVASTATIN CALCIUM 10 MG PO TABS: 10.0000 mg | ORAL_TABLET | Freq: Every day | ORAL | 3 refills | Status: AC

## 2024-11-09 NOTE — Progress Notes (Signed)
 George Montgomery                                          MRN: [id number]   11/09/2024   The VBCI Quality Team Specialist reviewed this patient medical record for the purposes of chart review for care gap closure. The following were reviewed: abstraction for care gap closure-glycemic status assessment.    VBCI Quality Team

## 2024-11-12 ENCOUNTER — Encounter: Payer: Self-pay | Admitting: Internal Medicine

## 2024-11-12 NOTE — Progress Notes (Unsigned)
 "     Subjective:    Patient ID: George Montgomery, male    DOB: Dec 22, 1955, 69 y.o.   MRN: [id number]     HPI Shivam is here for follow up of his chronic medical problems.  Dry mouth- going on for a while - has gotten worse.    Dr Kristie - to have colonoscopy in April.    RLS at night -has this often, but not nightly.  He had an old prescription from years ago that he used-not sure what medication was.  He did throw it away because it was expired.  He is not sure why he is having this and wife some nights and not all nights.  He knows he needs to lose weight.  He thinks he needs some help with this.  He recently had a procedure on his foot and has not been able to exercise, but knows he needs to do this on a regular basis.  He is trying to eat healthy and his wife stresses that him to eat more protein.  He is concerned about his sugars and wants to get that better controlled.   Medications and allergies reviewed with patient and updated if appropriate.  Medications Ordered Prior to Encounter[1]   Review of Systems  Constitutional:  Negative for fever.  Respiratory:  Negative for cough, shortness of breath and wheezing.   Cardiovascular:  Negative for chest pain, palpitations and leg swelling.  Neurological:  Positive for numbness (left little finger, foot from back). Negative for light-headedness and headaches.       Objective:   Vitals:   11/15/24 0900  BP: 126/62  Pulse: 69  Temp: 98.5 F (36.9 C)  SpO2: 95%   BP Readings from Last 3 Encounters:  11/15/24 126/62  09/07/24 130/68  09/05/24 136/66   Wt Readings from Last 3 Encounters:  11/15/24 232 lb (105.2 kg)  09/07/24 235 lb 8 oz (106.8 kg)  09/05/24 233 lb (105.7 kg)   Body mass index is 34.26 kg/m.    Physical Exam Constitutional:      General: He is not in acute distress.    Appearance: Normal appearance. He is not ill-appearing.  HENT:     Head: Normocephalic and atraumatic.  Eyes:      Conjunctiva/sclera: Conjunctivae normal.  Cardiovascular:     Rate and Rhythm: Normal rate and regular rhythm.     Heart sounds: Normal heart sounds.  Pulmonary:     Effort: Pulmonary effort is normal. No respiratory distress.     Breath sounds: Normal breath sounds. No wheezing or rales.  Musculoskeletal:     Right lower leg: No edema.     Left lower leg: No edema.  Skin:    General: Skin is warm and dry.     Findings: No rash.  Neurological:     Mental Status: He is alert. Mental status is at baseline.  Psychiatric:        Mood and Affect: Mood normal.        Lab Results  Component Value Date   WBC 7.5 08/15/2024   HGB 14.9 08/15/2024   HCT 43.7 08/15/2024   PLT 204.0 08/15/2024   GLUCOSE 132 (H) 08/15/2024   CHOL 114 08/15/2024   TRIG 249.0 (H) 08/15/2024   HDL 31.90 (L) 08/15/2024   LDLCALC 33 08/15/2024   ALT 26 08/15/2024   AST 20 08/15/2024   NA 139 08/15/2024   K 4.1 08/15/2024   CL 101 08/15/2024  CREATININE 0.81 08/15/2024   BUN 17 08/15/2024   CO2 29 08/15/2024   TSH 1.12 04/06/2024   PSA 1.35 08/15/2024   HGBA1C 7.4 (H) 08/15/2024   MICROALBUR <0.7 08/15/2024     Assessment & Plan:    See Problem List for Assessment and Plan of chronic medical problems.       [1]  Current Outpatient Medications on File Prior to Visit  Medication Sig Dispense Refill   albuterol  (VENTOLIN  HFA) 108 (90 Base) MCG/ACT inhaler Inhale 2 puffs into the lungs every 4 (four) hours as needed for wheezing or shortness of breath (coughing fits). 18 g 1   aspirin  EC 81 MG tablet Take 1 tablet (81 mg total) by mouth daily. Swallow whole.     EPINEPHrine  (EPIPEN  2-PAK) 0.3 mg/0.3 mL IJ SOAJ injection Inject 0.3 mg into the muscle as needed for anaphylaxis. 8 each 2   escitalopram  (LEXAPRO ) 10 MG tablet Take  1 tablet  Daily for Mood                                                              /                                                                   TAKE                                          BY                                                 MOUTH 90 tablet 3   fenofibrate  (TRICOR ) 145 MG tablet Take 1 tablet (145 mg total) by mouth daily.     fluticasone  (FLONASE ) 50 MCG/ACT nasal spray Place 1 spray into both nostrils 2 (two) times daily as needed (nasal congestion). 16 g 5   Fluticasone -Umeclidin-Vilant (TRELEGY ELLIPTA ) 200-62.5-25 MCG/ACT AEPB Inhale 1 puff into the lungs daily. Rinse mouth after each use. 60 each 5   omeprazole  (PRILOSEC) 20 MG capsule Take  1 capsule  Daily to Prevent Heartburn & Indigestion                                   /                                                                   TAKE  BY                                                 MOUTH 90 capsule 3   phentermine  (ADIPEX-P ) 37.5 MG tablet Take 1 tablet (37.5 mg total) by mouth daily before breakfast. 30 tablet 2   pregabalin  (LYRICA ) 25 MG capsule Take 1 capsule (25 mg total) by mouth 2 (two) times daily. 180 capsule 0   rosuvastatin  (CRESTOR ) 10 MG tablet Take 1 tablet (10 mg total) by mouth daily. 90 tablet 3   sildenafil  (VIAGRA ) 100 MG tablet Take   1/2 to 1 tablet   Daily  as needed for XXXX 30 tablet 1   tamsulosin  (FLOMAX ) 0.4 MG CAPS capsule TAKE 1 CAPSULE BY MOUTH AT BEDTIME FOR PROSTATE 90 capsule 0   traZODone  (DESYREL ) 50 MG tablet TAKE 1 TABLET BY MOUTH EVERYDAY AT BEDTIME 90 tablet 1   valsartan -hydrochlorothiazide  (DIOVAN -HCT) 80-12.5 MG tablet Take 1 tablet by mouth daily. for blood pressure 90 tablet 3   No current facility-administered medications on file prior to visit.   "

## 2024-11-12 NOTE — Patient Instructions (Addendum)
" ° ° ° ° °  Blood work was ordered.       Medications changes include :   Rybelsus  3 mg daily, requip  as needed for RLS    A referral for the weight management clinic   Return in about 3 months (around 02/13/2025) for follow up.  "

## 2024-11-15 ENCOUNTER — Telehealth: Payer: Self-pay

## 2024-11-15 ENCOUNTER — Ambulatory Visit: Admitting: Internal Medicine

## 2024-11-15 ENCOUNTER — Other Ambulatory Visit (HOSPITAL_COMMUNITY): Payer: Self-pay

## 2024-11-15 ENCOUNTER — Ambulatory Visit: Payer: Self-pay | Admitting: Internal Medicine

## 2024-11-15 VITALS — BP 126/62 | HR 69 | Temp 98.5°F | Ht 69.0 in | Wt 232.0 lb

## 2024-11-15 DIAGNOSIS — F32A Depression, unspecified: Secondary | ICD-10-CM

## 2024-11-15 DIAGNOSIS — I152 Hypertension secondary to endocrine disorders: Secondary | ICD-10-CM | POA: Diagnosis not present

## 2024-11-15 DIAGNOSIS — E785 Hyperlipidemia, unspecified: Secondary | ICD-10-CM | POA: Diagnosis not present

## 2024-11-15 DIAGNOSIS — R200 Anesthesia of skin: Secondary | ICD-10-CM

## 2024-11-15 DIAGNOSIS — N401 Enlarged prostate with lower urinary tract symptoms: Secondary | ICD-10-CM

## 2024-11-15 DIAGNOSIS — E559 Vitamin D deficiency, unspecified: Secondary | ICD-10-CM

## 2024-11-15 DIAGNOSIS — G4709 Other insomnia: Secondary | ICD-10-CM | POA: Diagnosis not present

## 2024-11-15 DIAGNOSIS — E66811 Obesity, class 1: Secondary | ICD-10-CM

## 2024-11-15 DIAGNOSIS — E1169 Type 2 diabetes mellitus with other specified complication: Secondary | ICD-10-CM

## 2024-11-15 DIAGNOSIS — E1159 Type 2 diabetes mellitus with other circulatory complications: Secondary | ICD-10-CM | POA: Diagnosis not present

## 2024-11-15 DIAGNOSIS — G2581 Restless legs syndrome: Secondary | ICD-10-CM | POA: Diagnosis not present

## 2024-11-15 DIAGNOSIS — F419 Anxiety disorder, unspecified: Secondary | ICD-10-CM | POA: Diagnosis not present

## 2024-11-15 DIAGNOSIS — M51362 Other intervertebral disc degeneration, lumbar region with discogenic back pain and lower extremity pain: Secondary | ICD-10-CM

## 2024-11-15 DIAGNOSIS — K219 Gastro-esophageal reflux disease without esophagitis: Secondary | ICD-10-CM | POA: Diagnosis not present

## 2024-11-15 DIAGNOSIS — N138 Other obstructive and reflux uropathy: Secondary | ICD-10-CM

## 2024-11-15 DIAGNOSIS — M48061 Spinal stenosis, lumbar region without neurogenic claudication: Secondary | ICD-10-CM

## 2024-11-15 DIAGNOSIS — M5416 Radiculopathy, lumbar region: Secondary | ICD-10-CM

## 2024-11-15 DIAGNOSIS — G4733 Obstructive sleep apnea (adult) (pediatric): Secondary | ICD-10-CM

## 2024-11-15 DIAGNOSIS — M47816 Spondylosis without myelopathy or radiculopathy, lumbar region: Secondary | ICD-10-CM

## 2024-11-15 DIAGNOSIS — J4540 Moderate persistent asthma, uncomplicated: Secondary | ICD-10-CM

## 2024-11-15 LAB — LIPID PANEL
Cholesterol: 110 mg/dL (ref 28–200)
HDL: 30.7 mg/dL — ABNORMAL LOW
LDL Cholesterol: 50 mg/dL (ref 10–99)
NonHDL: 79.63
Total CHOL/HDL Ratio: 4
Triglycerides: 148 mg/dL (ref 10.0–149.0)
VLDL: 29.6 mg/dL (ref 0.0–40.0)

## 2024-11-15 LAB — CBC WITH DIFFERENTIAL/PLATELET
Basophils Absolute: 0 10*3/uL (ref 0.0–0.1)
Basophils Relative: 0.5 % (ref 0.0–3.0)
Eosinophils Absolute: 0.2 10*3/uL (ref 0.0–0.7)
Eosinophils Relative: 2.8 % (ref 0.0–5.0)
HCT: 44.6 % (ref 39.0–52.0)
Hemoglobin: 15.5 g/dL (ref 13.0–17.0)
Lymphocytes Relative: 28.6 % (ref 12.0–46.0)
Lymphs Abs: 2.1 10*3/uL (ref 0.7–4.0)
MCHC: 34.7 g/dL (ref 30.0–36.0)
MCV: 93.8 fl (ref 78.0–100.0)
Monocytes Absolute: 0.6 10*3/uL (ref 0.1–1.0)
Monocytes Relative: 8.4 % (ref 3.0–12.0)
Neutro Abs: 4.4 10*3/uL (ref 1.4–7.7)
Neutrophils Relative %: 59.7 % (ref 43.0–77.0)
Platelets: 189 10*3/uL (ref 150.0–400.0)
RBC: 4.76 Mil/uL (ref 4.22–5.81)
RDW: 13.3 % (ref 11.5–15.5)
WBC: 7.4 10*3/uL (ref 4.0–10.5)

## 2024-11-15 LAB — COMPREHENSIVE METABOLIC PANEL WITH GFR
ALT: 36 U/L (ref 3–53)
AST: 23 U/L (ref 5–37)
Albumin: 4.6 g/dL (ref 3.5–5.2)
Alkaline Phosphatase: 88 U/L (ref 39–117)
BUN: 14 mg/dL (ref 6–23)
CO2: 31 meq/L (ref 19–32)
Calcium: 9.6 mg/dL (ref 8.4–10.5)
Chloride: 101 meq/L (ref 96–112)
Creatinine, Ser: 0.87 mg/dL (ref 0.40–1.50)
GFR: 87.84 mL/min
Glucose, Bld: 156 mg/dL — ABNORMAL HIGH (ref 70–99)
Potassium: 4.2 meq/L (ref 3.5–5.1)
Sodium: 139 meq/L (ref 135–145)
Total Bilirubin: 0.7 mg/dL (ref 0.2–1.2)
Total Protein: 7.4 g/dL (ref 6.0–8.3)

## 2024-11-15 LAB — FERRITIN: Ferritin: 107.3 ng/mL (ref 22.0–322.0)

## 2024-11-15 LAB — IBC PANEL
Iron: 119 ug/dL (ref 42–165)
Saturation Ratios: 33.1 % (ref 20.0–50.0)
TIBC: 359.8 ug/dL (ref 250.0–450.0)
Transferrin: 257 mg/dL (ref 212.0–360.0)

## 2024-11-15 LAB — HEMOGLOBIN A1C: Hgb A1c MFr Bld: 8.3 % — ABNORMAL HIGH (ref 4.6–6.5)

## 2024-11-15 LAB — VITAMIN D 25 HYDROXY (VIT D DEFICIENCY, FRACTURES): VITD: 29.7 ng/mL — ABNORMAL LOW (ref 30.00–100.00)

## 2024-11-15 MED ORDER — ROPINIROLE HCL 0.25 MG PO TABS: 0.2500 mg | ORAL_TABLET | Freq: Every evening | ORAL | 5 refills | Status: AC | PRN

## 2024-11-15 MED ORDER — RYBELSUS 3 MG PO TABS: 3.0000 mg | ORAL_TABLET | Freq: Every day | ORAL | 0 refills | Status: AC

## 2024-11-15 MED ORDER — VALSARTAN-HYDROCHLOROTHIAZIDE 80-12.5 MG PO TABS: 1.0000 | ORAL_TABLET | Freq: Every day | ORAL | 3 refills | Status: AC

## 2024-11-15 MED ORDER — ESCITALOPRAM OXALATE 10 MG PO TABS: ORAL_TABLET | ORAL | 3 refills | Status: AC

## 2024-11-15 NOTE — Assessment & Plan Note (Addendum)
 Chronic Controlled Continue trazodone  50 mg nightly prn - takes as needed

## 2024-11-15 NOTE — Assessment & Plan Note (Addendum)
 Chronic Controlled, Stable Continue Lexapro  10 mg daily-discussed this could potentially be worsening his RLS.  He does still feel he needs medication, but may try decreasing to half a pill or 5 mg to see if that is enough.

## 2024-11-15 NOTE — Assessment & Plan Note (Addendum)
 Chronic Actively working on weight loss BMI 34. 2 6 He is not exercising regularly because of the recent procedure on his foot Discontinue phentermine  37.5 mg daily Discussed the importance of decreasing calorie intake-aim for about 1600 cal a day-concentrate on eating proteins, vegetables and fiber.  Avoid too much fruits, carbs, sugars and drinking calories Will be starting Rybelsus  for his diabetes which should help  Would benefit greatly from the more diet lifestyle counseling in addition to regular follow-up-referral to healthy weight and wellness clinic

## 2024-11-15 NOTE — Assessment & Plan Note (Signed)
 Chronic Taking vitamin D daily Check vitamin D level

## 2024-11-15 NOTE — Telephone Encounter (Signed)
 Pharmacy Patient Advocate Encounter   Received notification from [facility] KEY that prior authorization for Rybelsus  3mg  is required/requested.   Insurance verification completed.   The patient is insured through [employer] [facility]   Per test claim: The current 30 day co-pay is, $0.00.  No PA needed at this time. This test claim was processed through [facility]- copay amounts may vary at other pharmacies due to pharmacy/plan contracts, or as the patient moves through the different stages of their insurance plan.

## 2024-11-15 NOTE — Assessment & Plan Note (Signed)
 Chronic Currently on Flomax  0.4 mg daily Following with urology

## 2024-11-15 NOTE — Assessment & Plan Note (Addendum)
 Chronic With CAD Regular exercise and healthy diet encouraged Check lipid panel  Continue Crestor  10 mg daily, fenofibrate  145 mg daily

## 2024-11-15 NOTE — Assessment & Plan Note (Signed)
 Chronic Using BiPAP nightly Getting more sleep then his watch is registering and does feel tired during the day advised to follow-up with whoever is managing his CPAP to see if that is working well or if revisions are needed

## 2024-11-15 NOTE — Assessment & Plan Note (Signed)
 Chronic Blood pressure well-controlled CMP, CBC Continue valsartan -hydrochlorothiazide  80-12.5 mg daily

## 2024-11-15 NOTE — Assessment & Plan Note (Signed)
 Chronic Controlled Following with allergy -Dr. Luke Continue daily Breo, albuterol  as needed

## 2024-11-15 NOTE — Assessment & Plan Note (Addendum)
 Chronic GERD controlled Continue omeprazole  20 mg daily-discussed that if possible we would like to get him off this medication-Will be working on weight loss and hopefully that will allow us  to taper off medication slowly

## 2024-11-15 NOTE — Assessment & Plan Note (Addendum)
 Chronic Associated with chronic kidney disease stage II, CAD, hypertension, hyperlipidemia  Lab Results  Component Value Date   HGBA1C 7.4 (H) 08/15/2024   Sugars not ideally controlled Check A1c Start Rybelsus  3 mg daily-after 1 month will increase to 7 mg Reviewed possible side effects Stressed diet high in protein and good hydration Stressed regular exercise, diabetic diet Working on weight loss

## 2024-11-15 NOTE — Telephone Encounter (Signed)
 Left message asking patient to call us  back to reschedule his follow up with Stacy until after he has his injection with KC

## 2024-11-15 NOTE — Assessment & Plan Note (Signed)
 Chronic Has had this problem in the past and recently has been experiencing intermittent RLS symptoms Several of his meds could be contributing which we discussed Check ferritin, iron panel Start Requip  0.25-0.5 mg nightly as needed

## 2024-11-16 ENCOUNTER — Telehealth: Payer: Self-pay

## 2024-11-17 ENCOUNTER — Other Ambulatory Visit (HOSPITAL_COMMUNITY): Payer: Self-pay

## 2024-11-17 MED ORDER — HYDROCODONE-ACETAMINOPHEN 5-325 MG PO TABS: 1.0000 | ORAL_TABLET | Freq: Two times a day (BID) | ORAL | 0 refills | Status: AC | PRN

## 2024-11-17 NOTE — Addendum Note (Signed)
 Addended byBETHA HILMA HASTINGS on: 11/17/2024 04:48 PM   Modules accepted: Orders

## 2024-11-17 NOTE — Telephone Encounter (Signed)
 Preferred pharmacy - [facility] [address] in [place]

## 2024-11-17 NOTE — Telephone Encounter (Signed)
 Patient called the office stating he is in a lot pain in his back and his feet are numb.  Patient is currently taking Tylenol  OTC. He has the injection scheduled but it will be another 2 weeks. Wants to know if there is anything he can do in the mean time or if medication can be sent to the pharmacy? Please advise.

## 2024-11-17 NOTE — Telephone Encounter (Signed)
 I would have him talk to Dr. Geofm about increasing his lyrica - she is prescribing this for him. This likely would help with numbness and pain.

## 2024-11-17 NOTE — Telephone Encounter (Signed)
 Pharmacy Patient Advocate Encounter   Received notification from [facility] KEY that prior authorization for Rybelsus  3mg  is required/requested.   Insurance verification completed.   The patient is insured through [facility].   Per test claim: The current 30 day co-pay is, $0.00.  No PA needed at this time. This test claim was processed through [facility]- copay amounts may vary at other pharmacies due to pharmacy/plan contracts, or as the patient moves through the different stages of their insurance pla

## 2024-11-17 NOTE — Telephone Encounter (Signed)
 I spoke to the patient and read George Montgomery's recommendation. He said that he is currently in bed. He wants something today for the pain.

## 2024-11-17 NOTE — Telephone Encounter (Signed)
 Medrol  dose pack - did help Mobic  - has some still. Quit taking it because possible kidney and liver damage. Did help in the past

## 2024-11-22 ENCOUNTER — Other Ambulatory Visit (HOSPITAL_COMMUNITY): Payer: Self-pay

## 2024-11-22 ENCOUNTER — Ambulatory Visit: Admitting: Orthopedic Surgery

## 2024-11-23 ENCOUNTER — Encounter (INDEPENDENT_AMBULATORY_CARE_PROVIDER_SITE_OTHER): Payer: Self-pay

## 2024-11-23 ENCOUNTER — Encounter: Payer: Self-pay | Admitting: Internal Medicine

## 2024-12-06 ENCOUNTER — Ambulatory Visit: Payer: Medicare (Managed Care)

## 2024-12-06 ENCOUNTER — Ambulatory Visit: Admitting: Allergy

## 2024-12-19 ENCOUNTER — Ambulatory Visit: Payer: Medicare (Managed Care) | Admitting: Orthopedic Surgery

## 2024-12-25 ENCOUNTER — Ambulatory Visit: Admitting: Neurology

## 2025-01-09 ENCOUNTER — Institutional Professional Consult (permissible substitution): Payer: Medicare (Managed Care) | Admitting: Plastic Surgery

## 2025-02-13 ENCOUNTER — Ambulatory Visit: Payer: Medicare (Managed Care) | Admitting: Internal Medicine

## 2025-03-22 ENCOUNTER — Ambulatory Visit: Admitting: Family Medicine

## 2025-03-22 ENCOUNTER — Ambulatory Visit: Admitting: Neurology
